# Patient Record
Sex: Female | Born: 1940 | Race: White | Hispanic: No | Marital: Married | Smoking: Former smoker
Health system: Southern US, Community
[De-identification: ages and names within clinical notes are randomized; demographics above are authoritative.]

## PROBLEM LIST (undated history)

## (undated) DIAGNOSIS — I639 Cerebral infarction, unspecified: Secondary | ICD-10-CM

## (undated) DIAGNOSIS — I10 Essential (primary) hypertension: Secondary | ICD-10-CM

## (undated) DIAGNOSIS — K8020 Calculus of gallbladder without cholecystitis without obstruction: Secondary | ICD-10-CM

## (undated) DIAGNOSIS — R42 Dizziness and giddiness: Secondary | ICD-10-CM

## (undated) DIAGNOSIS — N200 Calculus of kidney: Secondary | ICD-10-CM

## (undated) DIAGNOSIS — E119 Type 2 diabetes mellitus without complications: Secondary | ICD-10-CM

## (undated) HISTORY — DX: Calculus of gallbladder without cholecystitis without obstruction: K80.20

## (undated) HISTORY — PX: KIDNEY STONE SURGERY: SHX686

## (undated) HISTORY — DX: Cerebral infarction, unspecified: I63.9

## (undated) HISTORY — PX: ABDOMINAL HYSTERECTOMY: SHX81

---

## 1996-10-24 DIAGNOSIS — I639 Cerebral infarction, unspecified: Secondary | ICD-10-CM

## 1996-10-24 HISTORY — DX: Cerebral infarction, unspecified: I63.9

## 2014-11-07 ENCOUNTER — Encounter (HOSPITAL_BASED_OUTPATIENT_CLINIC_OR_DEPARTMENT_OTHER): Payer: Self-pay

## 2014-11-07 ENCOUNTER — Emergency Department (HOSPITAL_BASED_OUTPATIENT_CLINIC_OR_DEPARTMENT_OTHER)
Admission: EM | Admit: 2014-11-07 | Discharge: 2014-11-08 | Disposition: A | Discharge status: Home / Self Care | Payer: Medicare Other | Attending: Emergency Medicine | Admitting: Emergency Medicine

## 2014-11-07 DIAGNOSIS — M545 Low back pain, unspecified: Secondary | ICD-10-CM

## 2014-11-07 DIAGNOSIS — Z794 Long term (current) use of insulin: Secondary | ICD-10-CM | POA: Insufficient documentation

## 2014-11-07 DIAGNOSIS — Z87442 Personal history of urinary calculi: Secondary | ICD-10-CM | POA: Insufficient documentation

## 2014-11-07 DIAGNOSIS — Z7982 Long term (current) use of aspirin: Secondary | ICD-10-CM | POA: Diagnosis not present

## 2014-11-07 DIAGNOSIS — I10 Essential (primary) hypertension: Secondary | ICD-10-CM | POA: Diagnosis not present

## 2014-11-07 DIAGNOSIS — E119 Type 2 diabetes mellitus without complications: Secondary | ICD-10-CM | POA: Insufficient documentation

## 2014-11-07 DIAGNOSIS — R05 Cough: Secondary | ICD-10-CM | POA: Diagnosis not present

## 2014-11-07 DIAGNOSIS — Z79899 Other long term (current) drug therapy: Secondary | ICD-10-CM | POA: Diagnosis not present

## 2014-11-07 HISTORY — DX: Type 2 diabetes mellitus without complications: E11.9

## 2014-11-07 HISTORY — DX: Calculus of kidney: N20.0

## 2014-11-07 HISTORY — DX: Essential (primary) hypertension: I10

## 2014-11-07 MED ORDER — KETOROLAC TROMETHAMINE 60 MG/2ML IM SOLN
30.0000 mg | Freq: Once | INTRAMUSCULAR | Status: AC
  Administered 2014-11-07: 30 mg via INTRAMUSCULAR
  Filled 2014-11-07: qty 2

## 2014-11-07 MED ORDER — DIAZEPAM 5 MG PO TABS
5.0000 mg | ORAL_TABLET | Freq: Once | ORAL | Status: AC
  Administered 2014-11-07: 5 mg via ORAL
  Filled 2014-11-07: qty 1

## 2014-11-07 MED ORDER — HYDROCODONE-ACETAMINOPHEN 5-325 MG PO TABS
1.0000 | ORAL_TABLET | Freq: Once | ORAL | Status: AC
  Administered 2014-11-07: 1 via ORAL
  Filled 2014-11-07: qty 1

## 2014-11-07 NOTE — ED Notes (Addendum)
Pain to left back-states feels is a pulled muscle from coughing-pt states PCP started her on zithromax and tesslon perles Tuesday for bronchitis

## 2014-11-07 NOTE — ED Provider Notes (Signed)
CSN: [id number]   Arrival date & time 11/07/14 2209  History  By signing my name below, I, Bethel BornBritney McCollum, attest that this documentation has been prepared under the direction and in the presence of Marily MemosJason Noreen Mackintosh, MD. Electronically Signed: Bethel BornBritney McCollum, ED Scribe. 11/07/2014. 11:22 PM.  Chief Complaint  Patient presents with  . Back Pain    HPI The history is provided by the patient. No language interpreter was used.   Jill NuttingCarol Bohall is a 74 y.o. female who presents to the Emergency Department complaining of new and constant lower left back pain with onset 2 days ago after working at [employer]. The pain is rated 8/10 in severity and is worse with bending at the waist.  Pt states that she pulled a muscle from a cough that she has had for 3 weeks. She was seen by her PCP 3 days ago and started on Zithromax and tessalon pearls. She had similar pain years ago with a pulled muscle after bending to pick something up. No recent injury (notes a fall in July). Pt denies incontinence of bowel or bladder, change in strength or sensation, and change in appetite. History of uterine cancer treated with hysterectomy. History of small vessel disease.   Past Medical History  Diagnosis Date  . Diabetes mellitus without complication (HCC)   . Hypertension   . Kidney stone     Past Surgical History  Procedure Laterality Date  . Abdominal hysterectomy    . Kidney stone surgery      No family history on file.  Social History  Substance Use Topics  . Smoking status: Never Smoker   . Smokeless tobacco: None  . Alcohol Use: No     Review of Systems  Constitutional: Negative for fever, chills and appetite change.  Respiratory: Positive for cough.   Gastrointestinal: Negative for nausea and vomiting.  Musculoskeletal: Positive for back pain.  Neurological: Negative for weakness.  All other systems reviewed and are negative.  Home Medications   Prior to Admission medications   Medication Sig Start  Date End Date Taking? Authorizing Provider  amLODipine (NORVASC) 5 MG tablet Take 5 mg by mouth daily.   Yes Historical Provider, MD  aspirin 81 MG tablet Take 81 mg by mouth daily.   Yes Historical Provider, MD  atorvastatin (LIPITOR) 40 MG tablet Take 40 mg by mouth daily.   Yes Historical Provider, MD  Azithromycin (ZITHROMAX PO) Take by mouth.   Yes Historical Provider, MD  benzonatate (TESSALON) 100 MG capsule Take by mouth 3 (three) times daily as needed for cough.   Yes Historical Provider, MD  Canagliflozin (INVOKANA PO) Take by mouth.   Yes Historical Provider, MD  Cholecalciferol (VITAMIN D3) 5000 UNITS CAPS Take by mouth.   Yes Historical Provider, MD  glyBURIDE (DIABETA) 5 MG tablet Take 5 mg by mouth 2 (two) times daily with a meal.   Yes Historical Provider, MD  insulin glargine (LANTUS) 100 UNIT/ML injection Inject into the skin at bedtime.   Yes Historical Provider, MD  metFORMIN (GLUCOPHAGE) 1000 MG tablet Take 1,000 mg by mouth 2 (two) times daily with a meal.   Yes Historical Provider, MD  valsartan (DIOVAN) 320 MG tablet Take 320 mg by mouth daily.   Yes Historical Provider, MD  diazepam (VALIUM) 5 MG tablet Take 1 tablet (5 mg total) by mouth every 8 (eight) hours as needed for anxiety. 11/08/14   Marily MemosJason Roshonda Sperl, MD  HYDROcodone-acetaminophen (NORCO/VICODIN) 5-325 MG tablet Take 1 tablet by mouth  every 6 (six) hours as needed for moderate pain. 11/08/14   Marily Memos, MD  ibuprofen (ADVIL,MOTRIN) 400 MG tablet Take 1 tablet (400 mg total) by mouth 3 (three) times daily. 11/08/14   Marily Memos, MD    Allergies  Ultram  Triage Vitals: BP 147/60 mmHg  Pulse 92  Temp(Src) 98.1 F (36.7 C) (Oral)  Resp 18  Ht  (1.6 m)  Wt 183 lb (83.008 kg)  BMI 32.43 kg/m2  SpO2 99%  Physical Exam  Constitutional: She is oriented to person, place, and time. She appears well-developed and well-nourished.  HENT:  Head: Normocephalic.  Eyes: EOM are normal.  Neck: Normal range of  motion.  Pulmonary/Chest: Effort normal.  Abdominal: She exhibits no distension.  Musculoskeletal: Normal range of motion.  Left lumbar paraspinous TTP. Right side is normal. No midline tenderness.   Neurological: She is alert and oriented to person, place, and time.  Decreased strength in left hip abduction and adduction likely secondary to pain. Equal 4/5 strength in all other extremities Normal sensation. 2+ patellar reflexes.  Psychiatric: She has a normal mood and affect.  Nursing note and vitals reviewed.   ED Course  Procedures   DIAGNOSTIC STUDIES: Oxygen Saturation is 99% on RA, normal by my interpretation.    COORDINATION OF CARE: 11:17 PM Discussed treatment plan which includes valium and pain management with pt at bedside and pt agreed to plan.  Labs Reviewed - No data to display  Imaging Review No results found.    MDM   Final diagnoses:  Left-sided low back pain without sciatica    Left sided low back pain, likely 2/2 muscle strain. Abdomen benign, pain over lower pelvic area beyond aortic bifurcation, normal DP pulses and BP, doubt aortic aneurysm. No midline pain or other symptoms tosuggest bony or spinal pathology. Symptoms improved some with valium and vicodin, able to ambulate easier, move with less pain, will continue same at home with PCP follow up.   I have personally and contemperaneously reviewed labs and imaging and used in my decision making as above.   A medical screening exam was performed and I feel the patient has had an appropriate workup for their chief complaint at this time and likelihood of emergent condition existing is low. They have been counseled on decision, discharge, follow up and which symptoms necessitate immediate return to the emergency department. They or their family verbally stated understanding and agreement with plan and discharged in stable condition.    I personally performed the services described in this documentation,  which was scribed in my presence. The recorded information has been reviewed and is accurate.    Marily Memos, MD 11/08/14 [phone]

## 2014-11-08 MED ORDER — HYDROCODONE-ACETAMINOPHEN 5-325 MG PO TABS: 1.0000 | ORAL_TABLET | Freq: Four times a day (QID) | ORAL | Status: DC | PRN

## 2014-11-08 MED ORDER — DIAZEPAM 5 MG PO TABS: 5.0000 mg | ORAL_TABLET | Freq: Three times a day (TID) | ORAL | Status: DC | PRN

## 2014-11-08 MED ORDER — IBUPROFEN 400 MG PO TABS: 400.0000 mg | ORAL_TABLET | Freq: Three times a day (TID) | ORAL | Status: DC

## 2016-10-24 ENCOUNTER — Inpatient Hospital Stay (HOSPITAL_COMMUNITY): Payer: Medicare Other

## 2016-10-24 ENCOUNTER — Emergency Department (HOSPITAL_BASED_OUTPATIENT_CLINIC_OR_DEPARTMENT_OTHER): Payer: Medicare Other

## 2016-10-24 ENCOUNTER — Encounter (HOSPITAL_BASED_OUTPATIENT_CLINIC_OR_DEPARTMENT_OTHER): Payer: Self-pay | Admitting: Emergency Medicine

## 2016-10-24 ENCOUNTER — Inpatient Hospital Stay (HOSPITAL_BASED_OUTPATIENT_CLINIC_OR_DEPARTMENT_OTHER)
Admission: EM | Admit: 2016-10-24 | Discharge: 2016-10-26 | DRG: 066 | Disposition: A | Discharge status: Home Health | Payer: Medicare Other | Attending: Family Medicine | Admitting: Family Medicine

## 2016-10-24 DIAGNOSIS — E118 Type 2 diabetes mellitus with unspecified complications: Secondary | ICD-10-CM

## 2016-10-24 DIAGNOSIS — M797 Fibromyalgia: Secondary | ICD-10-CM | POA: Diagnosis present

## 2016-10-24 DIAGNOSIS — Z8669 Personal history of other diseases of the nervous system and sense organs: Secondary | ICD-10-CM

## 2016-10-24 DIAGNOSIS — G8321 Monoplegia of upper limb affecting right dominant side: Secondary | ICD-10-CM | POA: Diagnosis present

## 2016-10-24 DIAGNOSIS — E1165 Type 2 diabetes mellitus with hyperglycemia: Secondary | ICD-10-CM | POA: Diagnosis present

## 2016-10-24 DIAGNOSIS — I63512 Cerebral infarction due to unspecified occlusion or stenosis of left middle cerebral artery: Secondary | ICD-10-CM | POA: Diagnosis present

## 2016-10-24 DIAGNOSIS — G43909 Migraine, unspecified, not intractable, without status migrainosus: Secondary | ICD-10-CM | POA: Diagnosis present

## 2016-10-24 DIAGNOSIS — R29700 NIHSS score 0: Secondary | ICD-10-CM | POA: Diagnosis present

## 2016-10-24 DIAGNOSIS — Z823 Family history of stroke: Secondary | ICD-10-CM

## 2016-10-24 DIAGNOSIS — Z809 Family history of malignant neoplasm, unspecified: Secondary | ICD-10-CM

## 2016-10-24 DIAGNOSIS — R4781 Slurred speech: Secondary | ICD-10-CM | POA: Diagnosis present

## 2016-10-24 DIAGNOSIS — F419 Anxiety disorder, unspecified: Secondary | ICD-10-CM | POA: Diagnosis present

## 2016-10-24 DIAGNOSIS — R42 Dizziness and giddiness: Secondary | ICD-10-CM | POA: Diagnosis present

## 2016-10-24 DIAGNOSIS — Z888 Allergy status to other drugs, medicaments and biological substances status: Secondary | ICD-10-CM

## 2016-10-24 DIAGNOSIS — I6300 Cerebral infarction due to thrombosis of unspecified precerebral artery: Secondary | ICD-10-CM | POA: Diagnosis not present

## 2016-10-24 DIAGNOSIS — R1319 Other dysphagia: Secondary | ICD-10-CM | POA: Diagnosis present

## 2016-10-24 DIAGNOSIS — I10 Essential (primary) hypertension: Secondary | ICD-10-CM | POA: Diagnosis present

## 2016-10-24 DIAGNOSIS — R29704 NIHSS score 4: Secondary | ICD-10-CM | POA: Diagnosis not present

## 2016-10-24 DIAGNOSIS — Z79891 Long term (current) use of opiate analgesic: Secondary | ICD-10-CM

## 2016-10-24 DIAGNOSIS — H538 Other visual disturbances: Secondary | ICD-10-CM | POA: Diagnosis present

## 2016-10-24 DIAGNOSIS — R4701 Aphasia: Secondary | ICD-10-CM | POA: Diagnosis present

## 2016-10-24 DIAGNOSIS — I639 Cerebral infarction, unspecified: Secondary | ICD-10-CM

## 2016-10-24 DIAGNOSIS — Z833 Family history of diabetes mellitus: Secondary | ICD-10-CM

## 2016-10-24 DIAGNOSIS — Z87891 Personal history of nicotine dependence: Secondary | ICD-10-CM

## 2016-10-24 DIAGNOSIS — Z91040 Latex allergy status: Secondary | ICD-10-CM

## 2016-10-24 DIAGNOSIS — Z6829 Body mass index (BMI) 29.0-29.9, adult: Secondary | ICD-10-CM | POA: Diagnosis not present

## 2016-10-24 DIAGNOSIS — Z79899 Other long term (current) drug therapy: Secondary | ICD-10-CM

## 2016-10-24 DIAGNOSIS — G894 Chronic pain syndrome: Secondary | ICD-10-CM | POA: Diagnosis not present

## 2016-10-24 DIAGNOSIS — R2681 Unsteadiness on feet: Secondary | ICD-10-CM | POA: Diagnosis present

## 2016-10-24 DIAGNOSIS — Z885 Allergy status to narcotic agent status: Secondary | ICD-10-CM

## 2016-10-24 DIAGNOSIS — H53461 Homonymous bilateral field defects, right side: Secondary | ICD-10-CM | POA: Diagnosis present

## 2016-10-24 DIAGNOSIS — Z87442 Personal history of urinary calculi: Secondary | ICD-10-CM | POA: Diagnosis not present

## 2016-10-24 DIAGNOSIS — G8929 Other chronic pain: Secondary | ICD-10-CM | POA: Diagnosis present

## 2016-10-24 DIAGNOSIS — I63312 Cerebral infarction due to thrombosis of left middle cerebral artery: Secondary | ICD-10-CM | POA: Diagnosis not present

## 2016-10-24 DIAGNOSIS — E785 Hyperlipidemia, unspecified: Secondary | ICD-10-CM | POA: Diagnosis present

## 2016-10-24 DIAGNOSIS — H9203 Otalgia, bilateral: Secondary | ICD-10-CM | POA: Diagnosis present

## 2016-10-24 DIAGNOSIS — I679 Cerebrovascular disease, unspecified: Secondary | ICD-10-CM | POA: Insufficient documentation

## 2016-10-24 DIAGNOSIS — Z887 Allergy status to serum and vaccine status: Secondary | ICD-10-CM

## 2016-10-24 DIAGNOSIS — E669 Obesity, unspecified: Secondary | ICD-10-CM | POA: Diagnosis present

## 2016-10-24 DIAGNOSIS — Z7982 Long term (current) use of aspirin: Secondary | ICD-10-CM

## 2016-10-24 DIAGNOSIS — Z794 Long term (current) use of insulin: Secondary | ICD-10-CM

## 2016-10-24 DIAGNOSIS — Z8249 Family history of ischemic heart disease and other diseases of the circulatory system: Secondary | ICD-10-CM

## 2016-10-24 DIAGNOSIS — Z91048 Other nonmedicinal substance allergy status: Secondary | ICD-10-CM

## 2016-10-24 DIAGNOSIS — Z90710 Acquired absence of both cervix and uterus: Secondary | ICD-10-CM

## 2016-10-24 HISTORY — DX: Dizziness and giddiness: R42

## 2016-10-24 LAB — COMPREHENSIVE METABOLIC PANEL
ALK PHOS: 74 U/L (ref 38–126)
ALT: 17 U/L (ref 14–54)
AST: 23 U/L (ref 15–41)
Albumin: 3.5 g/dL (ref 3.5–5.0)
Anion gap: 7 (ref 5–15)
BILIRUBIN TOTAL: 0.5 mg/dL (ref 0.3–1.2)
BUN: 18 mg/dL (ref 6–20)
CALCIUM: 9.6 mg/dL (ref 8.9–10.3)
CO2: 19 mmol/L — ABNORMAL LOW (ref 22–32)
CREATININE: 0.61 mg/dL (ref 0.44–1.00)
Chloride: 110 mmol/L (ref 101–111)
GFR calc Af Amer: >60 mL/min (ref >60)
Glucose, Bld: 142 mg/dL — ABNORMAL HIGH (ref 65–99)
Potassium: 4.4 mmol/L (ref 3.5–5.1)
Sodium: 136 mmol/L (ref 135–145)
TOTAL PROTEIN: 7.6 g/dL (ref 6.5–8.1)

## 2016-10-24 LAB — ETHANOL: Alcohol, Ethyl (B): <10 mg/dL (ref <10)

## 2016-10-24 LAB — GLUCOSE, CAPILLARY
GLUCOSE-CAPILLARY: 229 mg/dL — AB (ref 65–99)
GLUCOSE-CAPILLARY: 86 mg/dL (ref 65–99)

## 2016-10-24 LAB — DIFFERENTIAL
BASOS ABS: 0 10*3/uL (ref 0.0–0.1)
Basophils Relative: 0 %
EOS ABS: 0.2 10*3/uL (ref 0.0–0.7)
Eosinophils Relative: 3 %
LYMPHS ABS: 2.9 10*3/uL (ref 0.7–4.0)
Lymphocytes Relative: 43 %
MONOS PCT: 7 %
Monocytes Absolute: 0.4 10*3/uL (ref 0.1–1.0)
NEUTROS ABS: 3.1 10*3/uL (ref 1.7–7.7)
Neutrophils Relative %: 47 %

## 2016-10-24 LAB — URINALYSIS, ROUTINE W REFLEX MICROSCOPIC
Bilirubin Urine: NEGATIVE
HGB URINE DIPSTICK: NEGATIVE
KETONES UR: NEGATIVE mg/dL
Leukocytes, UA: NEGATIVE
NITRITE: NEGATIVE
PROTEIN: NEGATIVE mg/dL
SPECIFIC GRAVITY, URINE: 1.01 (ref 1.005–1.030)
pH: 5.5 (ref 5.0–8.0)

## 2016-10-24 LAB — CBC
HEMATOCRIT: 38.1 % (ref 36.0–46.0)
HEMOGLOBIN: 12.2 g/dL (ref 12.0–15.0)
MCH: 27.7 pg (ref 26.0–34.0)
MCHC: 32 g/dL (ref 30.0–36.0)
MCV: 86.4 fL (ref 78.0–100.0)
Platelets: 202 10*3/uL (ref 150–400)
RBC: 4.41 MIL/uL (ref 3.87–5.11)
RDW: 15.1 % (ref 11.5–15.5)
WBC: 6.7 10*3/uL (ref 4.0–10.5)

## 2016-10-24 LAB — CBG MONITORING, ED: Glucose-Capillary: 145 mg/dL — ABNORMAL HIGH (ref 65–99)

## 2016-10-24 LAB — RAPID URINE DRUG SCREEN, HOSP PERFORMED
Amphetamines: NOT DETECTED
Barbiturates: NOT DETECTED
Benzodiazepines: NOT DETECTED
Cocaine: NOT DETECTED
Opiates: NOT DETECTED
Tetrahydrocannabinol: NOT DETECTED

## 2016-10-24 LAB — PROTIME-INR
INR: 1.02
Prothrombin Time: 13.3 seconds (ref 11.4–15.2)

## 2016-10-24 LAB — URINALYSIS, MICROSCOPIC (REFLEX): RBC / HPF: NONE SEEN RBC/hpf (ref 0–5)

## 2016-10-24 LAB — TROPONIN I

## 2016-10-24 LAB — HEMOGLOBIN A1C
Hgb A1c MFr Bld: 7 % — ABNORMAL HIGH (ref 4.8–5.6)
Mean Plasma Glucose: 154.2 mg/dL

## 2016-10-24 LAB — APTT: APTT: 27 s (ref 24–36)

## 2016-10-24 MED ORDER — IOPAMIDOL (ISOVUE-370) INJECTION 76%
INTRAVENOUS | Status: AC
  Administered 2016-10-24: 100 mL via INTRAVENOUS
  Filled 2016-10-24: qty 100

## 2016-10-24 MED ORDER — ONDANSETRON HCL 4 MG/2ML IJ SOLN: 4.0000 mg | Freq: Four times a day (QID) | INTRAMUSCULAR | Status: DC | PRN

## 2016-10-24 MED ORDER — ACETAMINOPHEN 650 MG RE SUPP: 650.0000 mg | Freq: Four times a day (QID) | RECTAL | Status: DC | PRN

## 2016-10-24 MED ORDER — ASPIRIN 325 MG PO TABS
325.0000 mg | ORAL_TABLET | Freq: Every day | ORAL | Status: DC
  Administered 2016-10-24: 325 mg via ORAL
  Filled 2016-10-24 (×2): qty 1

## 2016-10-24 MED ORDER — BENZONATATE 100 MG PO CAPS: 100.0000 mg | ORAL_CAPSULE | Freq: Three times a day (TID) | ORAL | Status: DC | PRN

## 2016-10-24 MED ORDER — INSULIN ASPART 100 UNIT/ML ~~LOC~~ SOLN: 0.0000 [IU] | Freq: Every day | SUBCUTANEOUS | Status: DC

## 2016-10-24 MED ORDER — GADOBENATE DIMEGLUMINE 529 MG/ML IV SOLN
20.0000 mL | Freq: Once | INTRAVENOUS | Status: AC | PRN
  Administered 2016-10-24: 17 mL via INTRAVENOUS

## 2016-10-24 MED ORDER — ASPIRIN 300 MG RE SUPP: 300.0000 mg | Freq: Every day | RECTAL | Status: DC

## 2016-10-24 MED ORDER — HYDROCODONE-ACETAMINOPHEN 5-325 MG PO TABS
1.0000 | ORAL_TABLET | Freq: Four times a day (QID) | ORAL | Status: DC | PRN
  Administered 2016-10-25: 1 via ORAL
  Filled 2016-10-24: qty 1

## 2016-10-24 MED ORDER — HYDRALAZINE HCL 20 MG/ML IJ SOLN: 5.0000 mg | INTRAMUSCULAR | Status: DC | PRN

## 2016-10-24 MED ORDER — ONDANSETRON HCL 4 MG PO TABS: 4.0000 mg | ORAL_TABLET | Freq: Four times a day (QID) | ORAL | Status: DC | PRN

## 2016-10-24 MED ORDER — ACETAMINOPHEN 325 MG PO TABS
650.0000 mg | ORAL_TABLET | Freq: Four times a day (QID) | ORAL | Status: DC | PRN
  Administered 2016-10-24: 650 mg via ORAL
  Filled 2016-10-24: qty 2

## 2016-10-24 MED ORDER — ATORVASTATIN CALCIUM 40 MG PO TABS
40.0000 mg | ORAL_TABLET | Freq: Every day | ORAL | Status: DC
  Filled 2016-10-24: qty 1

## 2016-10-24 MED ORDER — STROKE: EARLY STAGES OF RECOVERY BOOK: Freq: Once | Status: DC

## 2016-10-24 MED ORDER — DIAZEPAM 5 MG PO TABS: 5.0000 mg | ORAL_TABLET | Freq: Three times a day (TID) | ORAL | Status: DC | PRN

## 2016-10-24 MED ORDER — INSULIN ASPART 100 UNIT/ML ~~LOC~~ SOLN
0.0000 [IU] | Freq: Three times a day (TID) | SUBCUTANEOUS | Status: DC
  Administered 2016-10-24: 3 [IU] via SUBCUTANEOUS
  Administered 2016-10-25 (×2): 2 [IU] via SUBCUTANEOUS
  Administered 2016-10-25: 1 [IU] via SUBCUTANEOUS
  Administered 2016-10-26: 3 [IU] via SUBCUTANEOUS
  Administered 2016-10-26: 2 [IU] via SUBCUTANEOUS

## 2016-10-24 MED ORDER — DULAGLUTIDE 1.5 MG/0.5ML ~~LOC~~ SOAJ: 1.5000 mg | SUBCUTANEOUS | Status: DC

## 2016-10-24 NOTE — ED Notes (Signed)
ED Provider at bedside. 

## 2016-10-24 NOTE — ED Triage Notes (Signed)
Dr. Rubin Payor updated on pt condition

## 2016-10-24 NOTE — Progress Notes (Addendum)
Patient arrived via [facility] to [place] at [phone]. Patient reports that she was taken to [facility] by her husband because she "collapsed". Husband states  "this started about 2 months ago". "this morning, she came down to the den and was staggering like a drunk person,Speech issues for a week ago" when asked what he meant by "speech issues", Husband states "she just cant get her words out right"   He states "she's had issues holding things in her R hand from a week ago".   PT states "I have fibromyalsia". Patient reports that she has had "blurry vision" on her right eye for about 2 weeks. Patient reports having "ear aches". Asked how long its been going on and which ear is worse-Patient states "its been going on for about 2 weeks and the Right ear is worse"  Patient is A&O X4, denies pain. NIH 0 at this time. Spouse is at her bedside-Will continue to monitor.  Patient is oriented to unit, Pharmacy called to notify of patient's arrival, [facility] admission notified of patient arrival.  Patient also reports hx of Diabetes and Small vessel Disease. She states "I take  aspirin everyday"

## 2016-10-24 NOTE — Evaluation (Signed)
Physical Therapy Evaluation Patient Details Name: Jill Howard MRN: [id number] DOB: 04-26-1940 Today's Date: 10/24/2016   History of Present Illness  Pt is a 76 y/o female who presents with R sided weakness, decreased fine motor/grip strength/dysarthria of R hand. 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 MRI pending.  PMH significant for vertigo, fibromyalgia, DMII.  Clinical Impression  Pt admitted with above diagnosis. Pt currently with functional limitations due to the deficits listed below (see PT Problem List). At the time of PT eval, pt was able to ambulate around the room with gross min guard assist to supervision for safety. Pt reporting she is "fine", and her main complaint is R hand weakness. However, as session progressed, noted some word finding difficulty and using incorrect words during conversation. Evaluation was limited as transport arrived to take pt to MRI. Pt will benefit from skilled PT to increase their independence and safety with mobility to allow discharge to the venue listed below.       Follow Up Recommendations Home health PT;Supervision for mobility/OOB    Equipment Recommendations  None recommended by PT    Recommendations for Other Services       Precautions / Restrictions Precautions Precautions: Fall Restrictions Weight Bearing Restrictions: No      Mobility  Bed Mobility Overal bed mobility: Modified Independent             General bed mobility comments: Pt sitting EOB with bed alarming when PT entered. Pt was able to return to supine without assist and without difficulty.   Transfers Overall transfer level: Needs assistance Equipment used: None Transfers: Sit to/from Stand Sit to Stand: Min guard;Supervision         General transfer comment: Close guard for safety initially however progressed to supervision for safety.   Ambulation/Gait Ambulation/Gait assistance:  Min guard Ambulation Distance (Feet): 25 Feet Assistive device: None Gait Pattern/deviations: Step-through pattern;Decreased stride length;Trunk flexed Gait velocity: Decreased Gait velocity interpretation: Below normal speed for age/gender General Gait Details: Pt was able to ambulate in room only to bathroom, sink, chair, and then back to bed when transport arrived. Not agreeable to ambulating in the hall at this time.   Stairs            Wheelchair Mobility    Modified Rankin (Stroke Patients Only) Modified Rankin (Stroke Patients Only) Pre-Morbid Rankin Score: No symptoms Modified Rankin: Moderately severe disability     Balance Overall balance assessment: Needs assistance Sitting-balance support: No upper extremity supported;Feet supported Sitting balance-Leahy Scale: Fair     Standing balance support: No upper extremity supported;During functional activity Standing balance-Leahy Scale: Fair                               Pertinent Vitals/Pain Pain Assessment: No/denies pain    Home Living Family/patient expects to be discharged to:: Private residence Living Arrangements: Spouse/significant other Available Help at Discharge: Family;Available 24 hours/day Type of Home: House Home Access: Stairs to enter   [employer] of Steps: 6ish steps however only has to do 2 at a time   Home Equipment: Bedside commode Additional Comments: Not able to get all home information as transport arrived for MRI.     Prior Function Level of Independence: Independent               Hand Dominance        Extremity/Trunk Assessment  Upper Extremity Assessment Upper Extremity Assessment: RUE deficits/detail RUE Deficits / Details: Noted decreased grip strength on the R. Pt reports no N/T, just weakness in the hand.     Lower Extremity Assessment Lower Extremity Assessment: RLE deficits/detail RLE Deficits / Details: Pt reports her R knee is  "bad" at baseline and usually gets very sore with pain from fibromyalgia. 3/5 strength in quads (likely 2 knee pain), and grossly 4/5 strength in hamstrings and hip flexors. Pt denies N/T or light touch differences bilaterally    Cervical / Trunk Assessment Cervical / Trunk Assessment: Normal  Communication   Communication: Expressive difficulties (new per pt)  Cognition Arousal/Alertness: Awake/alert Behavior During Therapy: Flat affect Overall Cognitive Status: No family/caregiver present to determine baseline cognitive functioning                                 General Comments: Appears to have some decreased safety awareness with mobility.       General Comments      Exercises     Assessment/Plan    PT Assessment Patient needs continued PT services  PT Problem List Decreased strength;Decreased range of motion;Decreased activity tolerance;Decreased balance;Decreased mobility;Decreased knowledge of use of DME;Decreased safety awareness;Decreased cognition;Decreased knowledge of precautions       PT Treatment Interventions DME instruction;Gait training;Stair training;Functional mobility training;Therapeutic activities;Therapeutic exercise;Neuromuscular re-education;Patient/family education;Cognitive remediation    PT Goals (Current goals can be found in the Care Plan section)  Acute Rehab PT Goals Patient Stated Goal: None stated PT Goal Formulation: With patient Time For Goal Achievement: 10/31/16 Potential to Achieve Goals: Good    Frequency Min 4X/week   Barriers to discharge        Co-evaluation               AM-PAC PT "6 Clicks" Daily Activity  Outcome Measure Difficulty turning over in bed (including adjusting bedclothes, sheets and blankets)?: None Difficulty moving from lying on back to sitting on the side of the bed? : None Difficulty sitting down on and standing up from a chair with arms (e.g., wheelchair, bedside commode, etc,.)?: A  Little Help needed moving to and from a bed to chair (including a wheelchair)?: A Little Help needed walking in hospital room?: A Little Help needed climbing 3-5 steps with a railing? : A Little 6 Click Score: 20    End of Session Equipment Utilized During Treatment: Gait belt Activity Tolerance: Patient tolerated treatment well Patient left: in bed;Other (comment) (With transport present to take pt to MRI) Nurse Communication: Mobility status PT Visit Diagnosis: Unsteadiness on feet (R26.81);Other symptoms and signs involving the nervous system (R29.898)    Time: 9604-5409 PT Time Calculation (min) (ACUTE ONLY): 11 min   Charges:   PT Evaluation $PT Eval Moderate Complexity: 1 Mod     PT G Codes:        Conni Slipper, PT, DPT Acute Rehabilitation Services Pager: [phone]   Marylynn Pearson 10/24/2016, 2:02 PM

## 2016-10-24 NOTE — ED Notes (Signed)
Attempted to call report to floor-states they will call back to [facility].

## 2016-10-24 NOTE — ED Provider Notes (Signed)
[facility] Provider Note   CSN: [id number] Arrival date & time: 10/24/16  0801     History   Chief Complaint Chief Complaint  Patient presents with  . Difficulty Walking  . Aphasia    HPI Jill Howard is a 76 y.o. female.  HPI Patient presents with unsteadiness and difficulty finding words. States she feels weak all over but also feels somewhat like when she's had vertigo in the past. Began around a week ago. He seen her primary care doctor but unsure exactly what they thought. Patient's husband states that she said vertigo but patient says that's what she said not what he said. Has some pain in her right ear and said her eye feels sunken. Has had some dull right-sided headache also. States she's been trying to find the right words for a week but has trouble remembering the words. States she feels weak all over like there is pressure all over her. No new medicines in the last couple weeks. Does have a history of fibromyalgia. Reportedly has a spot on her brain that does not need to be followed for another couple years.Patient has had some difficulty using her right hand at times. Reportedly was eating the other day and had to switch to using her left hand. With further questioning patient's husband states around a month ago symptoms have gotten worse. Past Medical History:  Diagnosis Date  . Diabetes mellitus without complication (HCC)   . Hypertension   . Kidney stone   . Vertigo     There are no active problems to display for this patient.   Past Surgical History:  Procedure Laterality Date  . ABDOMINAL HYSTERECTOMY    . KIDNEY STONE SURGERY      OB History    No data available       Home Medications    Prior to Admission medications   Medication Sig Start Date End Date Taking? Authorizing Provider  Dulaglutide (TRULICITY Gardner) Inject into the skin.   Yes [provider]  lisinopril (PRINIVIL,ZESTRIL) 10 MG tablet Take 10 mg by mouth daily.    Yes [provider]  amLODipine (NORVASC) 5 MG tablet Take 5 mg by mouth daily.    [provider]  aspirin 81 MG tablet Take 81 mg by mouth daily.    [provider]  atorvastatin (LIPITOR) 40 MG tablet Take 40 mg by mouth daily.    [provider]  Azithromycin (ZITHROMAX PO) Take by mouth.    [provider]  benzonatate (TESSALON) 100 MG capsule Take by mouth 3 (three) times daily as needed for cough.    [provider]  Canagliflozin (INVOKANA PO) Take by mouth.    [provider]  Cholecalciferol (VITAMIN D3) 5000 UNITS CAPS Take by mouth.    [provider]  diazepam (VALIUM) 5 MG tablet Take 1 tablet (5 mg total) by mouth every 8 (eight) hours as needed for anxiety. 11/08/14   Mesner, Barbara Cower, MD  glyBURIDE (DIABETA) 5 MG tablet Take 5 mg by mouth 2 (two) times daily with a meal.    [provider]  HYDROcodone-acetaminophen (NORCO/VICODIN) 5-325 MG tablet Take 1 tablet by mouth every 6 (Howard) hours as needed for moderate pain. 11/08/14   Mesner, Barbara Cower, MD  ibuprofen (ADVIL,MOTRIN) 400 MG tablet Take 1 tablet (400 mg total) by mouth 3 (three) times daily. 11/08/14   Mesner, Barbara Cower, MD  insulin glargine (LANTUS) 100 UNIT/ML injection Inject into the skin at bedtime.  [provider]  metFORMIN (GLUCOPHAGE) 1000 MG tablet Take 1,000 mg by mouth 2 (two) times daily with a meal.    [provider]  valsartan (DIOVAN) 320 MG tablet Take 320 mg by mouth daily.    [provider]    Family History History reviewed. No pertinent family history.  Social History Social History  Substance Use Topics  . Smoking status: Never Smoker  . Smokeless tobacco: Never Used  . Alcohol use No     Allergies   Ultram [tramadol hcl]   Review of Systems Review of Systems  Constitutional: Negative for appetite change.  HENT: Positive for ear pain. Negative for sinus pressure and tinnitus.     Respiratory: Negative for shortness of breath.   Cardiovascular: Negative for chest pain.  Gastrointestinal: Negative for abdominal pain.  Genitourinary: Negative for dysuria.  Musculoskeletal: Negative for back pain.  Neurological: Positive for dizziness, speech difficulty and headaches. Negative for facial asymmetry and numbness.  Psychiatric/Behavioral: Negative for behavioral problems.     Physical Exam Updated Vital Signs BP 131/69 (BP Location: Right Arm)   Pulse 86   Temp 98.4 F (36.9 C)   Resp 18   Ht  (1.6 m)   Wt 76.7 kg (169 lb)   SpO2 100%   BMI 29.94 kg/m   Physical Exam  Constitutional: She is oriented to person, place, and time. She appears well-developed.  HENT:  Head: Atraumatic.  Eyes: Pupils are equal, round, and reactive to light. EOM are normal.  Neck: Neck supple.  Cardiovascular: Normal rate.   Pulmonary/Chest: Effort normal.  Abdominal: Soft.  Musculoskeletal: Normal range of motion.  Neurological: She is oriented to person, place, and time.  Patient is awake and appropriate. Eye movements grossly intact. Slight nystagmus with gaze to left. Does have conjugate gaze. Visual fields grossly intact by confrontation. Face symmetric. Good grips bilaterally. No drift on either upper or lower extremity. Heel shin and fingerintact bilaterally. A little unsteady during Romberg test but did not fall.  Skin: Skin is warm. Capillary refill takes less than 2 seconds.  Psychiatric: She has a normal mood and affect.     ED Treatments / Results  Labs (all labs ordered are listed, but only abnormal results are displayed) Labs Reviewed  COMPREHENSIVE METABOLIC PANEL - Abnormal; Notable for the following:       Result Value   CO2 19 (*)    Glucose, Bld 142 (*)    All other components within normal limits  URINALYSIS, ROUTINE W REFLEX MICROSCOPIC - Abnormal; Notable for the following:    Glucose, UA >=500 (*)    All other components within normal limits   URINALYSIS, MICROSCOPIC (REFLEX) - Abnormal; Notable for the following:    Bacteria, UA FEW (*)    Squamous Epithelial / LPF 0-5 (*)    All other components within normal limits  CBG MONITORING, ED - Abnormal; Notable for the following:    Glucose-Capillary 145 (*)    All other components within normal limits  ETHANOL  PROTIME-INR  APTT  CBC  DIFFERENTIAL  TROPONIN I  RAPID URINE DRUG SCREEN, HOSP PERFORMED    EKG  EKG Interpretation  Date/Time:  Monday October 24 2016 08:09:53 EDT Ventricular Rate:  81 PR Interval:    QRS Duration: 91 QT Interval:  382 QTC Calculation: 444 R Axis:   19 Text Interpretation:  Sinus rhythm Confirmed by Benjiman Core [phone]) on 10/24/2016 8:32:42 AM       Radiology  Dg Chest 2 View  Result Date: 10/24/2016 CLINICAL DATA:  Weakness. EXAM: CHEST  2 VIEW COMPARISON:  None. FINDINGS: Normal heart size and mediastinal contours. Subtle asymmetric density overlapping the heart in the AP view is not seen laterally consider transient artifactual. No acute infiltrate or edema. No effusion or pneumothorax. No acute osseous findings. EKG leads create artifact over the chest. IMPRESSION: No evidence of active disease. Electronically Signed   By: Marnee Spring M.D.   On: 10/24/2016 09:07   Ct Head Wo Contrast  Result Date: 10/24/2016 CLINICAL DATA:  Word-finding difficulty beginning 1 week ago. Unsteadiness. EXAM: CT HEAD WITHOUT CONTRAST TECHNIQUE: Contiguous axial images were obtained from the base of the skull through the vertex without intravenous contrast. COMPARISON:  None. FINDINGS: Brain: There is a small to moderate-sized posterior left MCA infarct in the inferior parietal region with well-defined cytotoxic edema and likely a small amount of petechial hemorrhage favoring an early subacute time course. No lobar hematoma or mass effect is present. Mild cerebral atrophy is within normal limits for age. Scattered subcortical and deep cerebral white  matter hypodensities bilaterally are nonspecific but compatible with mild chronic small vessel ischemic disease. There is no midline shift, mass, or extra-axial fluid collection. Vascular: Calcified atherosclerosis at the skullbase. No hyperdense vessel. Skull: No fracture focal osseous lesion. Sinuses/Orbits: Visualized paranasal sinuses and mastoid air cells are clear. Visualized orbits are unremarkable. Other: None. IMPRESSION: 1. 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 Likely small amount of petechial hemorrhage. 2. Mild chronic small vessel ischemic disease. Electronically Signed   By: Sebastian Ache M.D.   On: 10/24/2016 09:01    Procedures Procedures (including critical care time)  Medications Ordered in ED Medications - No data to display   Initial Impression / Assessment and Plan / ED Course  I have reviewed the triage vital signs and the nursing notes.  Pertinent labs & imaging results that were available during my care of the patient were reviewed by me and considered in my medical decision making (see chart for details).     Patient with likely stroke. Symptoms began somewhere between a month and a week ago and not a TPA candidate due to time of onset. Patient has likely subacute stroke on CT. However she has had a history of some sort of "white" abnormality in her brain. Will admit to hospitalist.  Final Clinical I yes mpressions(s) / ED Diagnoses   Final diagnoses:  Cerebrovascular accident (CVA), unspecified mechanism [facility])    New Prescriptions New Prescriptions   No medications on file     Benjiman Core, MD 10/24/16 [phone]

## 2016-10-24 NOTE — ED Notes (Signed)
Patient transported to CT 

## 2016-10-24 NOTE — Care Management Note (Signed)
Case Management Note  Patient Details  Name: Jill Howard MRN: [id number] Date of Birth: Apr 04, 1940  Subjective/Objective:   Pt in to r/o CVA. She is from home with her spouse.                  Action/Plan: Awaiting PT/OT recommendations. CM following for d/c needs, physician orders.   Expected Discharge Date:                  Expected Discharge Plan:     In-House Referral:     Discharge planning Services     Post Acute Care Choice:    Choice offered to:     DME Arranged:    DME Agency:     HH Arranged:    HH Agency:     Status of Service:  In process, will continue to follow  If discussed at Long Length of Stay Meetings, dates discussed:    Additional Comments:  Kermit Balo, RN 10/24/2016, 1:52 PM

## 2016-10-24 NOTE — ED Triage Notes (Signed)
Per spouse patient was staggering across the living room this morning and having a difficult time finding her words.  States the difficulty with speaking began 1 week ago.  Patient alert and oriented and speaking without difficulty at present.

## 2016-10-24 NOTE — H&P (Signed)
History and Physical    Jill Howard ZOX:[id number] DOB: 07/18/1940 DOA: 10/24/2016  PCP: System, Pcp Not In Patient coming from: [facility]  Chief Complaint: fall and difficulty speaking  HPI: Jill Howard is a 76 y.o. female with medical history significant of diabetes, hypertension, nephrolithiasis, BPV.  Patient reports an intermittent history over the last 3 weeks or so of difficulty with speaking. Or precisely described as difficulty with word finding and vocalization of the words. Patient also reports some right-handed clumsiness over the last 1-2 weeks making it difficult for her to eat. She also reports a 1-2 week history of new headaches. Patient has a long-standing history of migraines but current headache profile has been predominantly in the front part of her head which is new. On day of admission patient reports awaking at 03:00 with a severe frontal headache and a sensation of bilateral ear ache. She is able to lie in bed and fall back to sleep without any other intervention. In the morning patient's husband noticed that she was stumbling as she walked down the hall and fell onto the couch. She did not strike her head. At that time her eyes seem to be rolling around in her head and she was unable to say anything coherent. EMS was called and patient was brought to the emergency room.  Patient denies any recent chest pain, palpitations, vertigo, lightheadedness, neck stiffness, fevers, abdominal pain, dysuria, frequency, nausea, vomiting.   ED Course: Patient evaluated at [facility] and it was determined that she needed a full neurological workup for stroke. No medications or other interventions provided.   Review of Systems: As per HPI otherwise all other systems reviewed and are negative  Ambulatory Status: No restrictions prior to episode leading to admission.  Past Medical History:  Diagnosis Date  . Diabetes mellitus without complication (HCC)   .  Hypertension   . Kidney stone   . Vertigo     Past Surgical History:  Procedure Laterality Date  . ABDOMINAL HYSTERECTOMY    . KIDNEY STONE SURGERY      Social History   Social History  . Marital status: Married    Spouse name: N/A  . Number of children: N/A  . Years of education: N/A   Occupational History  . Not on file.   Social History Main Topics  . Smoking status: Never Smoker  . Smokeless tobacco: Never Used  . Alcohol use No  . Drug use: No  . Sexual activity: Not on file   Other Topics Concern  . Not on file   Social History Narrative  . No narrative on file    Allergies  Allergen Reactions  . Ultram [Tramadol Hcl]     hallucinations    Family History  Problem Relation Age of Onset  . Stroke Other   . Heart attack Other   . Cancer Other   . Diabetes Other       Prior to Admission medications   Medication Sig Start Date End Date Taking? Authorizing Provider  Dulaglutide (TRULICITY [place]) Inject into the skin.   Yes [provider]  lisinopril (PRINIVIL,ZESTRIL) 10 MG tablet Take 10 mg by mouth daily.   Yes [provider]  amLODipine (NORVASC) 5 MG tablet Take 5 mg by mouth daily.    [provider]  aspirin 81 MG tablet Take 81 mg by mouth daily.    [provider]  atorvastatin (LIPITOR) 40 MG tablet Take 40 mg by mouth  daily.    [provider]  Azithromycin (ZITHROMAX PO) Take by mouth.    [provider]  benzonatate (TESSALON) 100 MG capsule Take by mouth 3 (three) times daily as needed for cough.    [provider]  Canagliflozin (INVOKANA PO) Take by mouth.    [provider]  Cholecalciferol (VITAMIN D3) 5000 UNITS CAPS Take by mouth.    [provider]  diazepam (VALIUM) 5 MG tablet Take 1 tablet (5 mg total) by mouth every 8 (eight) hours as needed for anxiety. 11/08/14   Mesner, Barbara Cower, MD  glyBURIDE (DIABETA) 5 MG tablet Take 5 mg by mouth 2 (two) times daily  with a meal.    [provider]  HYDROcodone-acetaminophen (NORCO/VICODIN) 5-325 MG tablet Take 1 tablet by mouth every 6 (six) hours as needed for moderate pain. 11/08/14   Mesner, Barbara Cower, MD  ibuprofen (ADVIL,MOTRIN) 400 MG tablet Take 1 tablet (400 mg total) by mouth 3 (three) times daily. 11/08/14   Mesner, Barbara Cower, MD  insulin glargine (LANTUS) 100 UNIT/ML injection Inject into the skin at bedtime.    [provider]  metFORMIN (GLUCOPHAGE) 1000 MG tablet Take 1,000 mg by mouth 2 (two) times daily with a meal.    [provider]  valsartan (DIOVAN) 320 MG tablet Take 320 mg by mouth daily.    [provider]    Physical Exam: Vitals:   10/24/16 0839 10/24/16 0930 10/24/16 1018 10/24/16 1130  BP:  (!) 105/49 101/65 139/62  Pulse:  73 80 81  Resp:  Temp: 98.4 F (36.9 C)   98.1 F (36.7 C)  TempSrc:    Oral  SpO2:  100% 97% 100%  Weight:      Height:         General:  Appears calm and comfortable Eyes:  PERRL, EOMI, normal lids, iris ENT:  grossly normal hearing, lips & tongue, mmm Neck:  no LAD, masses or thyromegaly Cardiovascular:  RRR, no m/r/g. No LE edema.  Respiratory:  CTA bilaterally, no w/r/r. Normal respiratory effort. Abdomen:  soft, ntnd, NABS Skin:  no rash or induration seen on limited exam Musculoskeletal:  good ROM, no bony abnormality Psychiatric:  grossly normal mood and affect, speech fluent and appropriate, AOx3 Neurologic:  CN 2-12 grossly intact, right grip strength 4/5, left grip strength 5/5. Sensation intact. No dysmetria. Sits unassisted without   Labs on Admission: I have personally reviewed following labs and imaging studies  CBC:  Recent Labs Lab 10/24/16 0831  WBC 6.7  NEUTROABS 3.1  HGB 12.2  HCT 38.1  MCV 86.4  PLT 202   Basic Metabolic Panel:  Recent Labs Lab 10/24/16 0831  NA 136  K 4.4  CL 110  CO2 19*  GLUCOSE 142*  BUN 18  CREATININE 0.61  CALCIUM 9.6   GFR: Estimated  Creatinine Clearance: 61.4 mL/min (by C-G formula based on SCr of 0.61 mg/dL). Liver Function Tests:  Recent Labs Lab 10/24/16 0831  AST 23  ALT 17  ALKPHOS 74  BILITOT 0.5  PROT 7.6  ALBUMIN 3.5   No results for input(s): LIPASE, AMYLASE in the last 168 hours. No results for input(s): AMMONIA in the last 168 hours. Coagulation Profile:  Recent Labs Lab 10/24/16 0831  INR 1.02   Cardiac Enzymes:  Recent Labs Lab 10/24/16 0831  TROPONINI <0.03   BNP (last 3 results) No results for input(s): PROBNP in the last 8760 hours. HbA1C: No results for  input(s): HGBA1C in the last 72 hours. CBG:  Recent Labs Lab 10/24/16 0816  GLUCAP 145*   Lipid Profile: No results for input(s): CHOL, HDL, LDLCALC, TRIG, CHOLHDL, LDLDIRECT in the last 72 hours. Thyroid Function Tests: No results for input(s): TSH, T4TOTAL, FREET4, T3FREE, THYROIDAB in the last 72 hours. Anemia Panel: No results for input(s): VITAMINB12, FOLATE, FERRITIN, TIBC, IRON, RETICCTPCT in the last 72 hours. Urine analysis:    Component Value Date/Time   COLORURINE YELLOW 10/24/2016 0825   APPEARANCEUR CLEAR 10/24/2016 0825   LABSPEC 1.010 10/24/2016 0825   PHURINE 5.5 10/24/2016 0825   GLUCOSEU >=500 (A) 10/24/2016 0825   HGBUR NEGATIVE 10/24/2016 0825   BILIRUBINUR NEGATIVE 10/24/2016 0825   KETONESUR NEGATIVE 10/24/2016 0825   PROTEINUR NEGATIVE 10/24/2016 0825   NITRITE NEGATIVE 10/24/2016 0825   LEUKOCYTESUR NEGATIVE 10/24/2016 0825    Creatinine Clearance: Estimated Creatinine Clearance: 61.4 mL/min (by C-G formula based on SCr of 0.61 mg/dL).  Sepsis Labs: (procalcitonin:4,lacticidven:4) )No results found for this or any previous visit (from the past 240 hour(s)).   Radiological Exams on Admission: Dg Chest 2 View  Result Date: 10/24/2016 CLINICAL DATA:  Weakness. EXAM: CHEST  2 VIEW COMPARISON:  None. FINDINGS: Normal heart size and mediastinal contours. Subtle asymmetric density  overlapping the heart in the AP view is not seen laterally consider transient artifactual. No acute infiltrate or edema. No effusion or pneumothorax. No acute osseous findings. EKG leads create artifact over the chest. IMPRESSION: No evidence of active disease. Electronically Signed   By: Marnee Spring M.D.   On: 10/24/2016 09:07   Ct Head Wo Contrast  Result Date: 10/24/2016 CLINICAL DATA:  Word-finding difficulty beginning 1 week ago. Unsteadiness. EXAM: CT HEAD WITHOUT CONTRAST TECHNIQUE: Contiguous axial images were obtained from the base of the skull through the vertex without intravenous contrast. COMPARISON:  None. FINDINGS: Brain: There is a small to moderate-sized posterior left MCA infarct in the inferior parietal region with well-defined cytotoxic edema and likely a small amount of petechial hemorrhage favoring an early subacute time course. No lobar hematoma or mass effect is present. Mild cerebral atrophy is within normal limits for age. Scattered subcortical and deep cerebral white matter hypodensities bilaterally are nonspecific but compatible with mild chronic small vessel ischemic disease. There is no midline shift, mass, or extra-axial fluid collection. Vascular: Calcified atherosclerosis at the skullbase. No hyperdense vessel. Skull: No fracture focal osseous lesion. Sinuses/Orbits: Visualized paranasal sinuses and mastoid air cells are clear. Visualized orbits are unremarkable. Other: None. IMPRESSION: 1. 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 Likely small amount of petechial hemorrhage. 2. Mild chronic small vessel ischemic disease. Electronically Signed   By: Sebastian Ache M.D.   On: 10/24/2016 09:01    EKG: Independently reviewed. Sinus. No ACS  Assessment/Plan Active Problems:   Stroke (cerebrum) (HCC)   Essential hypertension   Diabetes mellitus with complication (HCC)   Chronic pain   Stroke: CT concerning for early subacute posterior left MCA infarct. Possible  small amount of petechial hemorrhage. Symptoms ongoing for approximately 3 weeks with acute worsening likely starting with severe headache on day of admission at 03:00. Currently patient continues to have weakened right-sided grip strength and fine motor skills in her hand and occasional dysarthria. - Stroke orders utilized - MRI/MRA head and neck - Echo - Neuro checks - Neuro consult - Permissive HTN - ASA, Statin  HTN: allow permissive HTN - Hold Valsartan, lisinopril, Norvasc  DM: - continue Trulicity, lantus (need Pharmacy  to reconcile) - SSI  Anxiety: - continue Valium  Chronic pain: - continue Norco  DVT prophylaxis: SCD  Code Status: full  Family Communication: husband  Disposition Plan: pending workup and Neuro eval  Consults called: Neuro  Admission status: inpt    Ozella Rocks MD [facility]  If 7PM-7AM, please contact night-coverage [email or web address] Password [email or web address]  10/24/2016, 12:54 PM

## 2016-10-24 NOTE — Consult Note (Addendum)
Neurology Consultation  Reason for Consult: left mCA stroke Referring Physician: Dr. Konrad Dolores  CC: word finding difficulty, right-sided weakness  History is obtained from:patient, husband, family bedside.  HPI: Jill Howard is a 76 y.o. female was past medical history of diabetes, hypertension, hyperlipidemia, fibromyalgia, ?vascular dementia (secondary to poorly controlled DM) who was in her usual state of health about 2 weeks ago, which is when she started noticing some difficulty using her right hand. She found it hard to use utensils. She had been having a hard time walking at times with leg weakness. These symptoms were off and on and completely resolved in between, hence she did not seek medical attention. She also has had frontal headache ongoing for the past 2 weeks, which is different than her usual migraines. This morning around 3 AM, she had extreme difficulty walking, could not support her weight and had word finding difficulty, slurred speech and is what prompted the family to bring her to the emergency room at [place] for evaluation. She was evaluated clinically and  Transferred to [facility] for an MRI/MRA brain. MRI of the brain showed strokes in the posterior division of the left MCA territory. The MRA of the brain also showed stenosis versus subocclusive thrombus in the left M1.   LKW: at least days ago if not 2 weeks ago. tpa given?: no, outside the window Premorbid modified Rankin scale (mRS): * 0   ROS:  Review of Systems  Constitutional: Positive for malaise/fatigue. Negative for chills and fever.  HENT: Negative for hearing loss and tinnitus.   Eyes: Positive for blurred vision. Negative for double vision.  Respiratory: Negative for cough and hemoptysis.   Cardiovascular: Negative for chest pain and palpitations.  Gastrointestinal: Negative for heartburn and nausea.  Genitourinary: Negative for dysuria and urgency.  Musculoskeletal: Positive for back pain and  myalgias.  Skin: Negative for itching and rash.  Neurological: Positive for tingling, sensory change, speech change, focal weakness and headaches. Negative for dizziness, tremors, seizures and loss of consciousness.  Endo/Heme/Allergies: Negative for environmental allergies. Does not bruise/bleed easily.  Psychiatric/Behavioral: Negative for depression and suicidal ideas.    Past Medical History:  Diagnosis Date  . Diabetes mellitus without complication (HCC)   . Hypertension   . Kidney stone   . Vertigo     Family History  Problem Relation Age of Onset  . Stroke Other   . Heart attack Other   . Cancer Other   . Diabetes Other    Social History:   reports that she has never smoked. She has never used smokeless tobacco. She reports that she does not drink alcohol or use drugs.   Medications  Current Facility-Administered Medications:  .   stroke: mapping our early stages of recovery book, , Does not apply, Once, Ozella Rocks, MD .  acetaminophen (TYLENOL) tablet 650 mg, 650 mg, Oral, Q6H PRN **OR** acetaminophen (TYLENOL) suppository 650 mg, 650 mg, Rectal, Q6H PRN, Ozella Rocks, MD .  aspirin suppository 300 mg, 300 mg, Rectal, Daily **OR** aspirin tablet 325 mg, 325 mg, Oral, Daily, Ozella Rocks, MD, 325 mg at 10/24/16 1602 .  atorvastatin (LIPITOR) tablet 40 mg, 40 mg, Oral, Daily, Ozella Rocks, MD .  benzonatate (TESSALON) capsule 100 mg, 100 mg, Oral, TID PRN, Ozella Rocks, MD .  diazepam (VALIUM) tablet 5 mg, 5 mg, Oral, Q8H PRN, Ozella Rocks, MD .  Melene Muller ON 10/25/2016] Dulaglutide SOPN 1.5 mg, 1.5 mg, Subcutaneous, Weekly, Konrad Dolores,  Elmon Else, MD .  hydrALAZINE (APRESOLINE) injection 5-10 mg, 5-10 mg, Intravenous, Q4H PRN, Ozella Rocks, MD .  HYDROcodone-acetaminophen (NORCO/VICODIN) 5-325 MG per tablet 1 tablet, 1 tablet, Oral, Q6H PRN, Ozella Rocks, MD .  insulin aspart (novoLOG) injection 0-5 Units, 0-5 Units, Subcutaneous, QHS, Ozella Rocks, MD .  insulin aspart (novoLOG) injection 0-9 Units, 0-9 Units, Subcutaneous, TID WC, Ozella Rocks, MD .  ondansetron [facility]) tablet 4 mg, 4 mg, Oral, Q6H PRN **OR** ondansetron (ZOFRAN) injection 4 mg, 4 mg, Intravenous, Q6H PRN, Ozella Rocks, MD   Exam: Current vital signs: BP 139/62 (BP Location: Left Arm)   Pulse 81   Temp 98.1 F (36.7 C) (Oral)   Resp 18   Ht  (1.6 m)   Wt 76.7 kg (169 lb)   SpO2 100%   BMI 29.94 kg/m  Vital signs in last 24 hours: Temp:  [98.1 F (36.7 C)-98.4 F (36.9 C)] 98.1 F (36.7 C) (10/01 1130) Pulse Rate:  [73-86] 81 (10/01 1130) Resp:  [15-20] 18 (10/01 1130) BP: (101-139)/(49-69) 139/62 (10/01 1130) SpO2:  [97 %-100 %] 100 % (10/01 1130) Weight:  [76.7 kg (169 lb)] 76.7 kg (169 lb) (10/01 0806)  GENERAL: Awake, alert in NAD HEENT: - Normocephalic and atraumatic, dry mm, no LN++, no Thyromegally LUNGS - Clear to auscultation bilaterally with no wheezes CV - S1S2 RRR, no m/r/g, equal pulses bilaterally. ABDOMEN - Soft, nontender, nondistended with normoactive BS Ext: warm, well perfused, intact peripheral pulses,  No edema  NEURO:  Mental Status: AA&Ox3  Language: speech is dear.  Naming, repetition, fluency, and comprehension intact.although she makes some paraphasic errors Cranial Nerves: PERRL 48mm/brisk. EOMI, visual fields examination shows right homonymous hemianopsia, and also extinguishes the right finger on confrontation, no facial asymmetry,facial sensation intact, hearing intact, tongue/uvula/soft palate midline, normal sternocleidomastoid and trapezius muscle strength. No evidence of tongue atrophy or fibrillations Motor: 5/5 in the right shoulder andelbow flexion and extension. 4/5 right hand grip. 5/5 left upper extremity all muscle groups. 4/5 right lower extremity limited by pain. 4+/5 left lower extremity-limited by pain. Tone: is normal and bulk is normal Sensation- Intact to light touch  bilaterally Coordination: FTN intact bilaterally, no ataxia in BLE. Gait- deferred  NIHSS 1a Level of Conscious.: 0 1b LOC Questions: 0 1c LOC Commands: 0 2 Best Gaze: 0 3 Visual: 2 4 Facial Palsy: 0 5a Motor Arm - left: 0 5b Motor Arm - Right: 0 6a Motor Leg - Left: 0 6b Motor Leg - Right: 0 7 Limb Ataxia: 0 8 Sensory: 0 9 Best Language: 1 10 Dysarthria: 0 11 Extinct. and Inatten.: 1 TOTAL: 4     Labs I have reviewed labs in epic and the results pertinent to this consultation are:  CBC    Component Value Date/Time   WBC 6.7 10/24/2016 0831   RBC 4.41 10/24/2016 0831   HGB 12.2 10/24/2016 0831   HCT 38.1 10/24/2016 0831   PLT 202 10/24/2016 0831   MCV 86.4 10/24/2016 0831   MCH 27.7 10/24/2016 0831   MCHC 32.0 10/24/2016 0831   RDW 15.1 10/24/2016 0831   LYMPHSABS 2.9 10/24/2016 0831   MONOABS 0.4 10/24/2016 0831   EOSABS 0.2 10/24/2016 0831   BASOSABS 0.0 10/24/2016 0831    CMP     Component Value Date/Time   NA 136 10/24/2016 0831   K 4.4 10/24/2016 0831   CL 110 10/24/2016 0831   CO2 19 (L) 10/24/2016 0831  GLUCOSE 142 (H) 10/24/2016 0831   BUN 18 10/24/2016 0831   CREATININE 0.61 10/24/2016 0831   CALCIUM 9.6 10/24/2016 0831   PROT 7.6 10/24/2016 0831   ALBUMIN 3.5 10/24/2016 0831   AST 23 10/24/2016 0831   ALT 17 10/24/2016 0831   ALKPHOS 74 10/24/2016 0831   BILITOT 0.5 10/24/2016 0831   GFRNONAA >60 10/24/2016 0831   GFRAA >60 10/24/2016 0831    Lipid Panel  No results found for: CHOL, TRIG, HDL, CHOLHDL, VLDL, LDLCALC, LDLDIRECT   Imaging I have reviewed the images obtained: CT-scan of the brain - shows evidence of subacute left MCA infarct. No Frank bleed but may be subtle petechial hemorrhage. MRI examination of the brain - scattered areas of restricted diffusion in the area corresponding to the posterior division of the left MCA territory. MRA of the head and neck shows stenosis versus some occlusive thrombus in the left M1. Also,  MRA of the neck without contrast shows 50% stenosis of the left carotid just after the bifurcation.  Assessment:  76 year old woman with intermittent right-sided weakness, word finding difficulty brought in because of worsening of right-sided weakness and word finding difficulty. On examination she also has some neglect ( visual) and right homonymous hemianopsia. Her brain imaging reveals scattered embolic looking infarcts in the left MCA more so in the posterior division territory. Magnetic resonance angiogram of the head vessels shows M1 stenosis versus subocclusive thrombus.  Impression: Acute ischemic stroke- Etiology: intracranial atherosclerosis versus cardioembolic versus atherombolic   Recommendations: I have ordered a CT angiogram of the head with perfusion study to better characterize the flow dynamics around the left M1 stenosis/subocclusive thrombus. Stroke risk factor workup to include: -Telemetry -2-D echo -A1c, lipid panel I would recommend aspirin 325, statin for now for stroke prevention. If the CT angiogram and perfusion of the brain is suggestive of intracranial atherosclerosis and not a thrombus, consider dual antiplatelet treatment for 3 months Stroke team will follow up with recommendations based on clinical course and test results.  Please call with questions.  Milon Dikes, MD [facility] [phone]  If 7pm to 7am, please call on call as listed on AMION.

## 2016-10-25 ENCOUNTER — Inpatient Hospital Stay (HOSPITAL_COMMUNITY): Payer: Medicare Other

## 2016-10-25 ENCOUNTER — Encounter (HOSPITAL_COMMUNITY): Payer: Self-pay | Admitting: Internal Medicine

## 2016-10-25 DIAGNOSIS — I639 Cerebral infarction, unspecified: Secondary | ICD-10-CM | POA: Diagnosis present

## 2016-10-25 DIAGNOSIS — I10 Essential (primary) hypertension: Secondary | ICD-10-CM

## 2016-10-25 DIAGNOSIS — Z8673 Personal history of transient ischemic attack (TIA), and cerebral infarction without residual deficits: Secondary | ICD-10-CM

## 2016-10-25 DIAGNOSIS — I63312 Cerebral infarction due to thrombosis of left middle cerebral artery: Secondary | ICD-10-CM

## 2016-10-25 HISTORY — DX: Personal history of transient ischemic attack (TIA), and cerebral infarction without residual deficits: Z86.73

## 2016-10-25 LAB — COMPREHENSIVE METABOLIC PANEL
ALBUMIN: 3 g/dL — AB (ref 3.5–5.0)
ALK PHOS: 70 U/L (ref 38–126)
ALT: 16 U/L (ref 14–54)
ANION GAP: 10 (ref 5–15)
AST: 18 U/L (ref 15–41)
BILIRUBIN TOTAL: 0.4 mg/dL (ref 0.3–1.2)
BUN: 13 mg/dL (ref 6–20)
CO2: 20 mmol/L — ABNORMAL LOW (ref 22–32)
Calcium: 9.2 mg/dL (ref 8.9–10.3)
Chloride: 108 mmol/L (ref 101–111)
Creatinine, Ser: 0.63 mg/dL (ref 0.44–1.00)
GFR calc Af Amer: >60 mL/min (ref >60)
GFR calc non Af Amer: >60 mL/min (ref >60)
GLUCOSE: 131 mg/dL — AB (ref 65–99)
Potassium: 3.8 mmol/L (ref 3.5–5.1)
Sodium: 138 mmol/L (ref 135–145)
TOTAL PROTEIN: 6.7 g/dL (ref 6.5–8.1)

## 2016-10-25 LAB — ECHOCARDIOGRAM COMPLETE
AOASC: 36 cm
CHL CUP MV DEC (S): 331
CHL CUP RV SYS PRESS: 22 mmHg
EWDT: 331 ms
FS: 40 % (ref 28–44)
Height: 63 in
IV/PV OW: 1.2
LA diam index: 2.24 cm/m2
LA vol A4C: 44.1 ml
LA vol: 46.9 mL
LASIZE: 42 mm
LAVOLIN: 25.1 mL/m2
LEFT ATRIUM END SYS DIAM: 42 mm
LV PW d: 10 mm — AB (ref 0.6–1.1)
LV TDI E'LATERAL: 10.4
LV TDI E'MEDIAL: 7.4
LVELAT: 10.4 cm/s
LVOT area: 2.54 cm2
LVOT diameter: 18 mm
Lateral S' vel: 13.1 cm/s
MV pk E vel: 0.7 m/s
Reg peak vel: 218 cm/s
TAPSE: 19.4 mm
TR max vel: 218 cm/s
Weight: 2704 oz

## 2016-10-25 LAB — LIPID PANEL
CHOLESTEROL: 146 mg/dL (ref 0–200)
HDL: 33 mg/dL — ABNORMAL LOW (ref >40)
LDL CALC: 81 mg/dL (ref 0–99)
TRIGLYCERIDES: 161 mg/dL — AB (ref <150)
Total CHOL/HDL Ratio: 4.4 RATIO
VLDL: 32 mg/dL (ref 0–40)

## 2016-10-25 LAB — GLUCOSE, CAPILLARY
Glucose-Capillary: 123 mg/dL — ABNORMAL HIGH (ref 65–99)
Glucose-Capillary: 169 mg/dL — ABNORMAL HIGH (ref 65–99)
Glucose-Capillary: 184 mg/dL — ABNORMAL HIGH (ref 65–99)
Glucose-Capillary: 173 mg/dL — ABNORMAL HIGH (ref 65–99)

## 2016-10-25 LAB — CBC
HEMATOCRIT: 37.4 % (ref 36.0–46.0)
HEMOGLOBIN: 11.9 g/dL — AB (ref 12.0–15.0)
MCH: 27.6 pg (ref 26.0–34.0)
MCHC: 31.8 g/dL (ref 30.0–36.0)
MCV: 86.8 fL (ref 78.0–100.0)
Platelets: 194 10*3/uL (ref 150–400)
RBC: 4.31 MIL/uL (ref 3.87–5.11)
RDW: 15.6 % — ABNORMAL HIGH (ref 11.5–15.5)
WBC: 6.3 10*3/uL (ref 4.0–10.5)

## 2016-10-25 MED ORDER — ATORVASTATIN CALCIUM 80 MG PO TABS: 80.0000 mg | ORAL_TABLET | Freq: Every day | ORAL | Status: DC

## 2016-10-25 MED ORDER — ASPIRIN 81 MG PO CHEW
81.0000 mg | CHEWABLE_TABLET | Freq: Every day | ORAL | Status: DC
  Administered 2016-10-25 – 2016-10-26 (×2): 81 mg via ORAL
  Filled 2016-10-25 (×2): qty 1

## 2016-10-25 MED ORDER — ENOXAPARIN SODIUM 40 MG/0.4ML ~~LOC~~ SOLN
40.0000 mg | SUBCUTANEOUS | Status: DC
  Administered 2016-10-26: 40 mg via SUBCUTANEOUS
  Filled 2016-10-25: qty 0.4

## 2016-10-25 MED ORDER — ATORVASTATIN CALCIUM 80 MG PO TABS: 80.0000 mg | ORAL_TABLET | Freq: Every day | ORAL | 3 refills | Status: DC

## 2016-10-25 MED ORDER — CLOPIDOGREL BISULFATE 75 MG PO TABS
300.0000 mg | ORAL_TABLET | Freq: Once | ORAL | Status: AC
  Administered 2016-10-25: 300 mg via ORAL
  Filled 2016-10-25: qty 4

## 2016-10-25 MED ORDER — CLOPIDOGREL BISULFATE 75 MG PO TABS
75.0000 mg | ORAL_TABLET | Freq: Every day | ORAL | Status: DC
  Administered 2016-10-26: 75 mg via ORAL
  Filled 2016-10-25: qty 1

## 2016-10-25 MED ORDER — CLOPIDOGREL BISULFATE 75 MG PO TABS: 75.0000 mg | ORAL_TABLET | Freq: Every day | ORAL | 4 refills | Status: DC

## 2016-10-25 NOTE — Progress Notes (Signed)
  Echocardiogram 2D Echocardiogram has been performed.  Jill Howard 10/25/2016, 12:09 PM

## 2016-10-25 NOTE — Plan of Care (Signed)
Discussed results with patient and daughter. Patient and family requested discharge in AM as she is having headache, nausea and not feeling well.    Thad Ranger M.D. [facility] 10/25/2016, 4:08 PM  Pager: [phone]

## 2016-10-25 NOTE — Progress Notes (Signed)
STROKE TEAM PROGRESS NOTE   HISTORY OF PRESENT ILLNESS (per record) Bernece Gall is a 76 y.o. female was past medical history of diabetes, hypertension, hyperlipidemia, fibromyalgia, ?vascular dementia (secondary to poorly controlled DM) who was in her usual state of health about 2 weeks ago, which is when she started noticing some difficulty using her right hand. She found it hard to use utensils. She had been having a hard time walking at times with leg weakness. These symptoms were off and on and completely resolved in between, hence she did not seek medical attention. She also has had frontal headache ongoing for the past 2 weeks, which is different than her usual migraines. This morning around 3 AM, she had extreme difficulty walking, could not support her weight and had word finding difficulty, slurred speech and is what prompted the family to bring her to the emergency room at [place] for evaluation. She was evaluated clinically and  Transferred to [facility] for an MRI/MRA brain. MRI of the brain showed strokes in the posterior division of the left MCA territory. The MRA of the brain also showed stenosis versus subocclusive thrombus in the left M1.   LKW: at least days ago if not 2 weeks ago. tpa given?: no, outside the window Premorbid modified Rankin scale (mRS): * 0   SUBJECTIVE (INTERVAL HISTORY) Her husband is at the bedside. On review of history she reports intermittent symptoms over at least 2 weeks of waxing and waning of these same symptoms preceding this stroke. She is resting comfortably but continues to experience word searching frustration. Her fine motor skills are impaired in right hand as well  but has been able to work with therapy. Echocardiogram was completed today.   OBJECTIVE Temp:  [98.1 F (36.7 C)-99 F (37.2 C)] 98.1 F (36.7 C) (10/02 1351) Pulse Rate:  [68-86] 78 (10/02 1351) Cardiac Rhythm: Normal sinus rhythm (10/02 0700) Resp:  [16-18] 18 (10/02  1351) BP: (111-144)/(49-70) 135/69 (10/02 1351) SpO2:  [96 %-100 %] 98 % (10/02 1351)  CBC:  Recent Labs Lab 10/24/16 0831 10/25/16 0447  WBC 6.7 6.3  NEUTROABS 3.1  --   HGB 12.2 11.9*  HCT 38.1 37.4  MCV 86.4 86.8  PLT 202 194    Basic Metabolic Panel:  Recent Labs Lab 10/24/16 0831 10/25/16 0447  NA 136 138  K 4.4 3.8  CL 110 108  CO2 19* 20*  GLUCOSE 142* 131*  BUN 18 13  CREATININE 0.61 0.63  CALCIUM 9.6 9.2    Lipid Panel:    Component Value Date/Time   CHOL 146 10/25/2016 0447   TRIG 161 (H) 10/25/2016 0447   HDL 33 (L) 10/25/2016 0447   CHOLHDL 4.4 10/25/2016 0447   VLDL 32 10/25/2016 0447   LDLCALC 81 10/25/2016 0447   HgbA1c:  Lab Results  Component Value Date   HGBA1C 7.0 (H) 10/24/2016   Urine Drug Screen:    Component Value Date/Time   LABOPIA NONE DETECTED 10/24/2016 0825   COCAINSCRNUR NONE DETECTED 10/24/2016 0825   LABBENZ NONE DETECTED 10/24/2016 0825   AMPHETMU NONE DETECTED 10/24/2016 0825   THCU NONE DETECTED 10/24/2016 0825   LABBARB NONE DETECTED 10/24/2016 0825    Alcohol Level     Component Value Date/Time   ETH <10 10/24/2016 0831    IMAGING  Ct Angio Head W Or Wo Contrast 10/24/2016 IMPRESSION: CTA NECK: 1. Atherosclerosis without hemodynamically significant stenosis or acute vascular process. CTA HEAD: 1. Short segment LEFT M1 severe  stenosis ; no emergent large vessel occlusion. 2. Severe stenosis LEFT ICA supraclinoid segment and RIGHT proximal M2 segment. Moderate stenoses bilateral anterior cerebral artery's. 3. Enhancing subacute LEFT parietal/MCA territory infarct, less likely interval hemorrhage. CT PERFUSION: 1. LEFT temporoparietal penumbra, profusion shows no discrete infarct though by prior imaging, this is not accurate. Aortic Atherosclerosis (ICD10-I70.0).  Dg Chest 2 View 10/24/2016 IMPRESSION: No evidence of active disease.  Ct Head Wo Contrast 10/24/2016 IMPRESSION: 1. Likely early subacute posterior  left MCA infarct. Likely small amount of petechial hemorrhage. 2. Mild chronic small vessel ischemic disease.  Ct Angio Neck W Or Wo Contrast 10/24/2016 IMPRESSION: CTA NECK: 1. Atherosclerosis without hemodynamically significant stenosis or acute vascular process. CTA HEAD: 1. Short segment LEFT M1 severe stenosis ; no emergent large vessel occlusion. 2. Severe stenosis LEFT ICA supraclinoid segment and RIGHT proximal M2 segment. Moderate stenoses bilateral anterior cerebral artery's. 3. Enhancing subacute LEFT parietal/MCA territory infarct, less likely interval hemorrhage. CT PERFUSION: 1. LEFT temporoparietal penumbra, profusion shows no discrete infarct though by prior imaging, this is not accurate. Aortic Atherosclerosis (ICD10-I70.0).  Mr [name] Neck W Wo Contrast 10/24/2016 IMPRESSION: 1. Scattered areas of acute ischemia within the left MCA distribution, greatest in the posterior left parietal and temporal lobes. No associated mass effect. 2. Area of hyperintense T1 weighted signal along the surface of the left posterior parietotemporal infarct likely indicates petechial hemorrhage. No space-occupying parenchymal hematoma. 3. Diminished flow related enhancement within the proximal left MCA M1 segment with normal flow related enhancement distally likely indicates subtotal occlusion. 4. Approximately 50% stenosis of the proximal left internal carotid artery.  Mr Brain Wo Contrast 10/24/2016 IMPRESSION: 1. Scattered areas of acute ischemia within the left MCA distribution, greatest in the posterior left parietal and temporal lobes. No associated mass effect. 2. Area of hyperintense T1 weighted signal along the surface of the left posterior parietotemporal infarct likely indicates petechial hemorrhage. No space-occupying parenchymal hematoma. 3. Diminished flow related enhancement within the proximal left MCA M1 segment with normal flow related enhancement distally likely indicates subtotal occlusion. 4.  Approximately 50% stenosis of the proximal left internal carotid artery.  Ct Cerebral Perfusion W Contrast 10/24/2016 IMPRESSION: CTA NECK: 1. Atherosclerosis without hemodynamically significant stenosis or acute vascular process. CTA HEAD: 1. Short segment LEFT M1 severe stenosis ; no emergent large vessel occlusion. 2. Severe stenosis LEFT ICA supraclinoid segment and RIGHT proximal M2 segment. Moderate stenoses bilateral anterior cerebral artery's. 3. Enhancing subacute LEFT parietal/MCA territory infarct, less likely interval hemorrhage. CT PERFUSION: 1. LEFT temporoparietal penumbra, profusion shows no discrete infarct though by prior imaging, this is not accurate. Aortic Atherosclerosis (ICD10-I70.0).  Mr [name] Head Wo Contrast 10/24/2016 IMPRESSION: 1. Scattered areas of acute ischemia within the left MCA distribution, greatest in the posterior left parietal and temporal lobes. No associated mass effect. 2. Area of hyperintense T1 weighted signal along the surface of the left posterior parietotemporal infarct likely indicates petechial hemorrhage. No space-occupying parenchymal hematoma. 3. Diminished flow related enhancement within the proximal left MCA M1 segment with normal flow related enhancement distally likely indicates subtotal occlusion. 4. Approximately 50% stenosis of the proximal left internal carotid artery.    PHYSICAL EXAM Constitutional- alert, co-operative, NAD Cardiac- RRR, no murmurs, rubs or gallops. Respiratory- CTAB, no wheezes or crackles. Neurologic- CN: Eye movement intact throughout, partial right temporal visual field deficit, tongue midline, face symmetric Language: Expressive aphasia with word searching difficulty, comprehension intact Motor: 4/5 right hand strength, 5/5 lower extremities, right hand movements slightly  slowed, gait not tested, no dysarthria Sensory: Sensation grossly intact throughout   ASSESSMENT/PLAN Ms. Pocahontas Cohenour is a 76 y.o. female  with history of diabetes mellitus, hypertension, former smoking presenting with right hand weakness and aphasia. She did not receive IV t-PA due to outside window.    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 Yes; Fluid consistency: Thin  No antithrombotic prior to admission, now on aspirin 81 mg daily and clopidogrel 75 mg daily  Patient counseled to be compliant with her antithrombotic medications  Ongoing aggressive stroke risk factor management  Therapy recommendations:  HHPT/Outpatient OT/Outpatient SLP/24hr supervision  Disposition:  Home  Hypertension  Stable  Permissive hypertension (OK if < 220/120) but gradually normalize in 5-7 days  Long-term BP goal normotensive  Hyperlipidemia  Home meds: atorvastatin , increased to  here  LDL 81, goal < 70  Continue statin at discharge  Diabetes  HgbA1c 7.0, goal < 7.0  Borderline Controlled  Other Stroke Risk Factors  Advanced age  Obesity, Body mass index is 29.94 kg/m., recommend weight loss, diet and exercise as appropriate   Migraines  Other Active Problems  She will be treated with DAPT x3 months then plavix alone given history likely multiple TIAs preceding intrinsic stroke 2/2 vessel disease. Loading dose plavix  today then .  Hospital day # 1 I have personally examined this patient, reviewed notes, independently viewed imaging studies, participated in medical decision making and plan of care.ROS completed by me  personally and pertinent positives fully documented  I have made any additions or clarifications directly to the above note. . She presented with stuttering expressive aphasia and left MCA branch infarct secondary to likely intrinsic left MCA stenosis with near occlusion. Recommend dual antiplatelet therapy of aspirin and Plavix for 3 months followed by Plavix alone. Aggressive risk factor modification. Long discussion of the bedside with patient and family and answered questions. Discussed with Dr.  Isidoro Donning. Patient may consider possible participation in the stroke atrial fibrillation trial if interested. Greater than 50% of time during this 35 minute visit was spent on counseling and coordination of care about her intracranial stenosis, TIAs, stroke discussion about treatment and prevention and answering questions. Delia Heady, MD Medical Director [facility] Pager: [phone] 10/25/2016 4:13 PM    To contact Stroke Continuity provider, please refer to [email or web address]. After hours, contact General Neurology

## 2016-10-25 NOTE — Progress Notes (Signed)
[facility]                                                                              Patient Demographics  Jill Howard, is a 76 y.o. female, DOB - 1940-10-23, ZOX:[id number]  Admit date - 10/24/2016   Admitting Physician Ozella Rocks, MD  Outpatient Primary MD for the patient is System, Pcp Not In  Outpatient specialists:   LOS - 1  days   Medical records reviewed and are as summarized below:    Chief Complaint  Patient presents with  . Difficulty Walking  . Aphasia       Brief summary   Patient is a 76 year old female with diabetes, hypertension, BPV, nephrolithiasis, migraines presented with difficulty speaking in the last 3 weeks, described as difficulty with foot finding and vocalization. She also reported right hand clumsiness over the last 1-2 weeks and new headaches. On the day of admission, patient woke up at 3 AM with severe frontal headache, bilateral earache, subsequently walked down the hall and fell onto the couch. Patient was brought to ED for further workup. MRI positive for stroke .    Assessment & Plan    Principal Problem:   CVA (cerebral vascular accident) (HCC) - MRI of the brain showed scattered areas of acute ischemia within the left MCA distribution greatest in the posterior left parietal and temporal lobes, no associated mass effect. Area of hyperintense T1 weighted signal along the surface of the left posterior parietotemporal infarct likely indicates petechial hemorrhage. - MRA of the brain and neck showed diminished flow, subtotal occlusion of the left MCA M1 segment. Approximately 50% stenosis of the proximal left ICA - CTA head and neck showed left M1 severe stenosis, no emergent large vessel occlusion, severe stenosis left ICA supraclinoid segment and right proximal M2 segment.  - 2-D echo pending - PT OT ST pending - Per neurological mentation, placed on aspirin 81 mg daily, Plavix 75 mg daily - Continue Lipitor 80 mg  daily, LDL 81  Active Problems:   Essential hypertension  - Currently stable, permissive hypertension and first 24 hours    Diabetes mellitus with complication (HCC) - Hemoglobin A1c 7.0, needs aggressive control for stroke prevention -Continue sliding scale insulin     Chronic pain - Currently stable   Code Status: full  DVT Prophylaxis:  Lovenox Family Communication: Discussed in detail with the patient, all imaging results, lab results explained to the patient     Disposition Plan: Pending neurology clearance   Time Spent in minutes  25 minutes  Procedures:  MRI, MRA brain   CT angiogram of the head and neck  Consultants:   Neurology   Antimicrobials:      Medications  Scheduled Meds: .  stroke: mapping our early stages of recovery book   Does not apply Once  . aspirin  81 mg Oral Daily  . atorvastatin  80 mg Oral Daily  . clopidogrel  300 mg Oral Once  . [START ON 10/26/2016] clopidogrel  75 mg Oral Daily  . Dulaglutide  1.5 mg Subcutaneous Weekly  . enoxaparin (LOVENOX) injection  40 mg Subcutaneous Q24H  .  insulin aspart  0-5 Units Subcutaneous QHS  . insulin aspart  0-9 Units Subcutaneous TID WC   Continuous Infusions: PRN Meds:.acetaminophen **OR** acetaminophen, benzonatate, diazepam, hydrALAZINE, HYDROcodone-acetaminophen, ondansetron **OR** ondansetron (ZOFRAN) IV   Antibiotics   Anti-infectives    None        Subjective:   Jill Howard was seen and examined today. Still having word finding difficulty, no headache nausea or vomiting. Does not feel any focal weakness at this time.  Patient denies dizziness, chest pain, shortness of breath, abdominal pain, N/V/D/C. No acute events overnight.    Objective:   Vitals:   10/24/16 2200 10/25/16 0239 10/25/16 0600 10/25/16 1013  BP: (!) 144/70 (!) 111/59 (!) 125/56 136/63  Pulse: 82 75 68 86  Resp: 16 16 18 18   Temp: 98.3 F (36.8 C) 98.7 F (37.1 C) 98.6 F (37 C) 98.9 F (37.2 C)    TempSrc: Oral Oral Oral Oral  SpO2: 96% 97% 100% 98%  Weight:      Height:        Intake/Output Summary (Last 24 hours) at 10/25/16 1153 Last data filed at 10/25/16 0900  Gross per 24 hour  Intake              240 ml  Output                0 ml  Net              240 ml     Wt Readings from Last 3 Encounters:  10/24/16 76.7 kg (169 lb)  11/07/14 83 kg (183 lb)     Exam  General: Alert and oriented x 3, NAD  Eyes:   HEENT:  Atraumatic, normocephalic  Cardiovascular: S1 S2 auscultated, no rubs, murmurs or gallops. Regular rate and rhythm.  Respiratory: Clear to auscultation bilaterally, no wheezing, rales or rhonchi  Gastrointestinal: Soft, nontender, nondistended, + bowel sounds  Ext: no pedal edema bilaterally  Neuro: AAOx3, Cr N's II- XII. Strength 5/5 upper and lower extremities bilaterally, Word finding difficulty   Musculoskeletal: No digital cyanosis, clubbing  Skin: No rashes  Psych: Normal affect and demeanor, alert and oriented x3    Data Reviewed:  I have personally reviewed following labs and imaging studies  Micro Results No results found for this or any previous visit (from the past 240 hour(s)).  Radiology Reports Ct Angio Head W Or Wo Contrast  Result Date: 10/24/2016 CLINICAL DATA:  Frontal headaches for 2 weeks. Extreme difficulty ambulating today. Follow-up LEFT MCA stroke and LEFT M1 stenosis versus thromboembolism. History of hypertension and diabetes. EXAM: CT ANGIOGRAPHY HEAD AND NECK CT PERFUSION BRAIN TECHNIQUE: Multidetector CT imaging of the head and neck was performed using the standard protocol during bolus administration of intravenous contrast. Multiplanar CT image reconstructions and MIPs were obtained to evaluate the vascular anatomy. Carotid stenosis measurements (when applicable) are obtained utilizing NASCET criteria, using the distal internal carotid diameter as the denominator. Multiphase CT imaging of the brain was performed  following IV bolus contrast injection. Subsequent parametric perfusion maps were calculated using RAPID software. CONTRAST:  90 cc Isovue 370 COMPARISON:  MRI and MRA of the head October 24, 2016 at 1420 hours and CTA HEAD October 24, 2016 at 0851 hours FINDINGS: CTA NECK AORTIC ARCH: Normal appearance of the thoracic arch, normal branch pattern. Mild intimal thickening and calcific atherosclerosis. The origins of the innominate, left Common carotid artery and subclavian artery are widely patent. RIGHT CAROTID SYSTEM: Common carotid  artery is patent, motion degraded. Retropharyngeal bifurcation, mild calcific atherosclerosis without hemodynamically significant stenosis by NASCET criteria. Patent internal carotid artery, tortuous course associated with hypertension. LEFT CAROTID SYSTEM: Common carotid artery is patent, motion degraded. Retropharyngeal bifurcation, mild calcific atherosclerosis without hemodynamically significant stenosis by NASCET criteria. Patent internal carotid artery, tortuous course associated with hypertension. VERTEBRAL ARTERIES:Codominant vertebral artery's, widely patent. Mild extrinsic compression due to degenerative cervical spine. SKELETON: No acute osseous process though bone windows have not been submitted. Patient is edentulous. Moderate upper cervical spondylosis. OTHER NECK: Soft tissues of the neck are nonacute though, not tailored for evaluation. Moderate UPPER CHEST: Included lung apices are clear. No superior mediastinal lymphadenopathy. CTA HEAD ANTERIOR CIRCULATION: Patent cervical internal carotid arteries, petrous, cavernous and supra clinoid internal carotid arteries. Moderate calcific atherosclerosis carotid siphons. Severe stenosis LEFT supraclinoid segment. 2 mm segment of LEFT M1 severe stenosis within 2 mm of the origin. LEFT middle cerebral artery is patent distal to this stenosis. Patent anterior communicating artery. Anterior cerebral artery's are patent with moderate  stenoses bilateral distal A2 segments. Severe stenosis proximal RIGHT M2 segment. No contrast extravasation or aneurysm. POSTERIOR CIRCULATION: Patent vertebral arteries, vertebrobasilar junction and basilar artery, as well as main branch vessels. Mild calcific atherosclerosis vertebral artery's. Patent posterior cerebral arteries, mild tandem stenoses. Tiny LEFT posterior communicating artery present. No contrast extravasation or aneurysm. VENOUS SINUSES: Major dural venous sinuses are patent though not tailored for evaluation on this angiographic examination. ANATOMIC VARIANTS: Common origin RIGHT PCA and RIGHT SCA. DELAYED PHASE: Faintly dense LEFT parietal cortices at site of known infarct. MIP images reviewed. CT Brain Perfusion Findings: CBF (<30%) Volume:  3 mL, though in contralateral hemisphere. Perfusion (Tmax>6.0s) volume: 51mL Mismatch Volume: 48mL Infarction Location:LEFT temporoparietal. IMPRESSION: CTA NECK: 1. Atherosclerosis without hemodynamically significant stenosis or acute vascular process. CTA HEAD: 1. Short segment LEFT M1 severe stenosis ; no emergent large vessel occlusion. 2. Severe stenosis LEFT ICA supraclinoid segment and RIGHT proximal M2 segment. Moderate stenoses bilateral anterior cerebral artery's. 3. Enhancing subacute LEFT parietal/MCA territory infarct, less likely interval hemorrhage. CT PERFUSION: 1. LEFT temporoparietal penumbra, profusion shows no discrete infarct though by prior imaging, this is not accurate. Aortic Atherosclerosis (ICD10-I70.0). Electronically Signed   By: Awilda Metro M.D.   On: 10/24/2016 22:29   Dg Chest 2 View  Result Date: 10/24/2016 CLINICAL DATA:  Weakness. EXAM: CHEST  2 VIEW COMPARISON:  None. FINDINGS: Normal heart size and mediastinal contours. Subtle asymmetric density overlapping the heart in the AP view is not seen laterally consider transient artifactual. No acute infiltrate or edema. No effusion or pneumothorax. No acute osseous  findings. EKG leads create artifact over the chest. IMPRESSION: No evidence of active disease. Electronically Signed   By: Marnee Spring M.D.   On: 10/24/2016 09:07   Ct Head Wo Contrast  Result Date: 10/24/2016 CLINICAL DATA:  Word-finding difficulty beginning 1 week ago. Unsteadiness. EXAM: CT HEAD WITHOUT CONTRAST TECHNIQUE: Contiguous axial images were obtained from the base of the skull through the vertex without intravenous contrast. COMPARISON:  None. FINDINGS: Brain: There is a small to moderate-sized posterior left MCA infarct in the inferior parietal region with well-defined cytotoxic edema and likely a small amount of petechial hemorrhage favoring an early subacute time course. No lobar hematoma or mass effect is present. Mild cerebral atrophy is within normal limits for age. Scattered subcortical and deep cerebral white matter hypodensities bilaterally are nonspecific but compatible with mild chronic small vessel ischemic disease. There is no  midline shift, mass, or extra-axial fluid collection. Vascular: Calcified atherosclerosis at the skullbase. No hyperdense vessel. Skull: No fracture focal osseous lesion. Sinuses/Orbits: Visualized paranasal sinuses and mastoid air cells are clear. Visualized orbits are unremarkable. Other: None. IMPRESSION: 1. 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 Likely small amount of petechial hemorrhage. 2. Mild chronic small vessel ischemic disease. Electronically Signed   By: Sebastian Ache M.D.   On: 10/24/2016 09:01   Ct Angio Neck W Or Wo Contrast  Result Date: 10/24/2016 CLINICAL DATA:  Frontal headaches for 2 weeks. Extreme difficulty ambulating today. Follow-up LEFT MCA stroke and LEFT M1 stenosis versus thromboembolism. History of hypertension and diabetes. EXAM: CT ANGIOGRAPHY HEAD AND NECK CT PERFUSION BRAIN TECHNIQUE: Multidetector CT imaging of the head and neck was performed using the standard protocol during bolus administration of intravenous  contrast. Multiplanar CT image reconstructions and MIPs were obtained to evaluate the vascular anatomy. Carotid stenosis measurements (when applicable) are obtained utilizing NASCET criteria, using the distal internal carotid diameter as the denominator. Multiphase CT imaging of the brain was performed following IV bolus contrast injection. Subsequent parametric perfusion maps were calculated using RAPID software. CONTRAST:  90 cc Isovue 370 COMPARISON:  MRI and MRA of the head October 24, 2016 at 1420 hours and CTA HEAD October 24, 2016 at 0851 hours FINDINGS: CTA NECK AORTIC ARCH: Normal appearance of the thoracic arch, normal branch pattern. Mild intimal thickening and calcific atherosclerosis. The origins of the innominate, left Common carotid artery and subclavian artery are widely patent. RIGHT CAROTID SYSTEM: Common carotid artery is patent, motion degraded. Retropharyngeal bifurcation, mild calcific atherosclerosis without hemodynamically significant stenosis by NASCET criteria. Patent internal carotid artery, tortuous course associated with hypertension. LEFT CAROTID SYSTEM: Common carotid artery is patent, motion degraded. Retropharyngeal bifurcation, mild calcific atherosclerosis without hemodynamically significant stenosis by NASCET criteria. Patent internal carotid artery, tortuous course associated with hypertension. VERTEBRAL ARTERIES:Codominant vertebral artery's, widely patent. Mild extrinsic compression due to degenerative cervical spine. SKELETON: No acute osseous process though bone windows have not been submitted. Patient is edentulous. Moderate upper cervical spondylosis. OTHER NECK: Soft tissues of the neck are nonacute though, not tailored for evaluation. Moderate UPPER CHEST: Included lung apices are clear. No superior mediastinal lymphadenopathy. CTA HEAD ANTERIOR CIRCULATION: Patent cervical internal carotid arteries, petrous, cavernous and supra clinoid internal carotid arteries. Moderate  calcific atherosclerosis carotid siphons. Severe stenosis LEFT supraclinoid segment. 2 mm segment of LEFT M1 severe stenosis within 2 mm of the origin. LEFT middle cerebral artery is patent distal to this stenosis. Patent anterior communicating artery. Anterior cerebral artery's are patent with moderate stenoses bilateral distal A2 segments. Severe stenosis proximal RIGHT M2 segment. No contrast extravasation or aneurysm. POSTERIOR CIRCULATION: Patent vertebral arteries, vertebrobasilar junction and basilar artery, as well as main branch vessels. Mild calcific atherosclerosis vertebral artery's. Patent posterior cerebral arteries, mild tandem stenoses. Tiny LEFT posterior communicating artery present. No contrast extravasation or aneurysm. VENOUS SINUSES: Major dural venous sinuses are patent though not tailored for evaluation on this angiographic examination. ANATOMIC VARIANTS: Common origin RIGHT PCA and RIGHT SCA. DELAYED PHASE: Faintly dense LEFT parietal cortices at site of known infarct. MIP images reviewed. CT Brain Perfusion Findings: CBF (<30%) Volume:  3 mL, though in contralateral hemisphere. Perfusion (Tmax>6.0s) volume: 51mL Mismatch Volume: 48mL Infarction Location:LEFT temporoparietal. IMPRESSION: CTA NECK: 1. Atherosclerosis without hemodynamically significant stenosis or acute vascular process. CTA HEAD: 1. Short segment LEFT M1 severe stenosis ; no emergent large vessel occlusion. 2. Severe stenosis LEFT ICA  supraclinoid segment and RIGHT proximal M2 segment. Moderate stenoses bilateral anterior cerebral artery's. 3. Enhancing subacute LEFT parietal/MCA territory infarct, less likely interval hemorrhage. CT PERFUSION: 1. LEFT temporoparietal penumbra, profusion shows no discrete infarct though by prior imaging, this is not accurate. Aortic Atherosclerosis (ICD10-I70.0). Electronically Signed   By: Awilda Metro M.D.   On: 10/24/2016 22:29   Mr [name] Neck W Wo Contrast  Addendum Date:  10/24/2016   ADDENDUM REPORT: 10/24/2016 16:22 ADDENDUM: These results were called by telephone at the time of interpretation on 10/24/2016 at 4:21 pm to Dr. Shelly Flatten , who verbally acknowledged these results. Electronically Signed   By: Deatra Robinson M.D.   On: 10/24/2016 16:22   Result Date: 10/24/2016 CLINICAL DATA:  Focal neuro deficits for greater than 6 hours. EXAM: MRI HEAD WITHOUT AND WITH CONTRAST MRA HEAD WITHOUT CONTRAST MRA NECK WITHOUT AND WITH CONTRAST TECHNIQUE: Multiplanar, multiecho pulse sequences of the brain and surrounding structures were obtained without and with intravenous contrast. Angiographic images of the Circle of Willis were obtained using MRA technique without intravenous contrast. Angiographic images of the neck were obtained using MRA technique without and with intravenous contrast. Carotid stenosis measurements (when applicable) are obtained utilizing NASCET criteria, using the distal internal carotid diameter as the denominator. CONTRAST:  17mL MULTIHANCE GADOBENATE DIMEGLUMINE 529 MG/ML IV SOLN COMPARISON:  Head CT 10/24/2016 FINDINGS: MRI HEAD FINDINGS Brain: The midline structures are normal. Multiple areas of diffusion restriction scattered through the left MCA distribution, greatest in the left parietal lobe. No contralateral foci of ischemia. There is edema within the same distribution as the ischemia. Minimal periventricular hyperintense T2 weighted signal. There is hyperintense T1 weighted signal along the cortical surface of the left parietoccipital infarcted territory without associated susceptibility abnormality. No mass lesion. No hydrocephalus, age advanced atrophy or lobar predominant volume loss. Skull and upper cervical spine: The visualized skull base, calvarium, upper cervical spine and extracranial soft tissues are normal. Sinuses/Orbits: No fluid levels or advanced mucosal thickening. No mastoid effusion. Normal orbits. MRA HEAD FINDINGS Intracranial  internal carotid arteries: Normal. Anterior cerebral arteries: Normal. Middle cerebral arteries: There is a focal area of diminished flow related enhancement in the proximal left M1 segment. Enhancement is normal within the left MCA distally. Posterior communicating arteries: Absent bilaterally. Posterior cerebral arteries: Normal. Basilar artery: Normal. Vertebral arteries: Codominant. Normal. Superior cerebellar arteries: Normal. Anterior inferior cerebellar arteries: Normal. Posterior inferior cerebellar arteries: Normal. MRA NECK FINDINGS There is 50% stenosis of the proximal left internal carotid artery just distal to the carotid bifurcation. No other carotid stenosis. The codominant vertebral arteries are normal. IMPRESSION: 1. Scattered areas of acute ischemia within the left MCA distribution, greatest in the posterior left parietal and temporal lobes. No associated mass effect. 2. Area of hyperintense T1 weighted signal along the surface of the left posterior parietotemporal infarct likely indicates petechial hemorrhage. No space-occupying parenchymal hematoma. 3. Diminished flow related enhancement within the proximal left MCA M1 segment with normal flow related enhancement distally likely indicates subtotal occlusion. 4. Approximately 50% stenosis of the proximal left internal carotid artery. Electronically Signed: By: Deatra Robinson M.D. On: 10/24/2016 15:54   Mr Brain Wo Contrast  Addendum Date: 10/24/2016   ADDENDUM REPORT: 10/24/2016 16:22 ADDENDUM: These results were called by telephone at the time of interpretation on 10/24/2016 at 4:21 pm to Dr. Shelly Flatten , who verbally acknowledged these results. Electronically Signed   By: Deatra Robinson M.D.   On: 10/24/2016 16:22   Result  Date: 10/24/2016 CLINICAL DATA:  Focal neuro deficits for greater than 6 hours. EXAM: MRI HEAD WITHOUT AND WITH CONTRAST MRA HEAD WITHOUT CONTRAST MRA NECK WITHOUT AND WITH CONTRAST TECHNIQUE: Multiplanar, multiecho  pulse sequences of the brain and surrounding structures were obtained without and with intravenous contrast. Angiographic images of the Circle of Willis were obtained using MRA technique without intravenous contrast. Angiographic images of the neck were obtained using MRA technique without and with intravenous contrast. Carotid stenosis measurements (when applicable) are obtained utilizing NASCET criteria, using the distal internal carotid diameter as the denominator. CONTRAST:  17mL MULTIHANCE GADOBENATE DIMEGLUMINE 529 MG/ML IV SOLN COMPARISON:  Head CT 10/24/2016 FINDINGS: MRI HEAD FINDINGS Brain: The midline structures are normal. Multiple areas of diffusion restriction scattered through the left MCA distribution, greatest in the left parietal lobe. No contralateral foci of ischemia. There is edema within the same distribution as the ischemia. Minimal periventricular hyperintense T2 weighted signal. There is hyperintense T1 weighted signal along the cortical surface of the left parietoccipital infarcted territory without associated susceptibility abnormality. No mass lesion. No hydrocephalus, age advanced atrophy or lobar predominant volume loss. Skull and upper cervical spine: The visualized skull base, calvarium, upper cervical spine and extracranial soft tissues are normal. Sinuses/Orbits: No fluid levels or advanced mucosal thickening. No mastoid effusion. Normal orbits. MRA HEAD FINDINGS Intracranial internal carotid arteries: Normal. Anterior cerebral arteries: Normal. Middle cerebral arteries: There is a focal area of diminished flow related enhancement in the proximal left M1 segment. Enhancement is normal within the left MCA distally. Posterior communicating arteries: Absent bilaterally. Posterior cerebral arteries: Normal. Basilar artery: Normal. Vertebral arteries: Codominant. Normal. Superior cerebellar arteries: Normal. Anterior inferior cerebellar arteries: Normal. Posterior inferior cerebellar  arteries: Normal. MRA NECK FINDINGS There is 50% stenosis of the proximal left internal carotid artery just distal to the carotid bifurcation. No other carotid stenosis. The codominant vertebral arteries are normal. IMPRESSION: 1. Scattered areas of acute ischemia within the left MCA distribution, greatest in the posterior left parietal and temporal lobes. No associated mass effect. 2. Area of hyperintense T1 weighted signal along the surface of the left posterior parietotemporal infarct likely indicates petechial hemorrhage. No space-occupying parenchymal hematoma. 3. Diminished flow related enhancement within the proximal left MCA M1 segment with normal flow related enhancement distally likely indicates subtotal occlusion. 4. Approximately 50% stenosis of the proximal left internal carotid artery. Electronically Signed: By: Deatra Robinson M.D. On: 10/24/2016 15:54   Ct Cerebral Perfusion W Contrast  Result Date: 10/24/2016 CLINICAL DATA:  Frontal headaches for 2 weeks. Extreme difficulty ambulating today. Follow-up LEFT MCA stroke and LEFT M1 stenosis versus thromboembolism. History of hypertension and diabetes. EXAM: CT ANGIOGRAPHY HEAD AND NECK CT PERFUSION BRAIN TECHNIQUE: Multidetector CT imaging of the head and neck was performed using the standard protocol during bolus administration of intravenous contrast. Multiplanar CT image reconstructions and MIPs were obtained to evaluate the vascular anatomy. Carotid stenosis measurements (when applicable) are obtained utilizing NASCET criteria, using the distal internal carotid diameter as the denominator. Multiphase CT imaging of the brain was performed following IV bolus contrast injection. Subsequent parametric perfusion maps were calculated using RAPID software. CONTRAST:  90 cc Isovue 370 COMPARISON:  MRI and MRA of the head October 24, 2016 at 1420 hours and CTA HEAD October 24, 2016 at 0851 hours FINDINGS: CTA NECK AORTIC ARCH: Normal appearance of the  thoracic arch, normal branch pattern. Mild intimal thickening and calcific atherosclerosis. The origins of the innominate, left Common carotid artery and  subclavian artery are widely patent. RIGHT CAROTID SYSTEM: Common carotid artery is patent, motion degraded. Retropharyngeal bifurcation, mild calcific atherosclerosis without hemodynamically significant stenosis by NASCET criteria. Patent internal carotid artery, tortuous course associated with hypertension. LEFT CAROTID SYSTEM: Common carotid artery is patent, motion degraded. Retropharyngeal bifurcation, mild calcific atherosclerosis without hemodynamically significant stenosis by NASCET criteria. Patent internal carotid artery, tortuous course associated with hypertension. VERTEBRAL ARTERIES:Codominant vertebral artery's, widely patent. Mild extrinsic compression due to degenerative cervical spine. SKELETON: No acute osseous process though bone windows have not been submitted. Patient is edentulous. Moderate upper cervical spondylosis. OTHER NECK: Soft tissues of the neck are nonacute though, not tailored for evaluation. Moderate UPPER CHEST: Included lung apices are clear. No superior mediastinal lymphadenopathy. CTA HEAD ANTERIOR CIRCULATION: Patent cervical internal carotid arteries, petrous, cavernous and supra clinoid internal carotid arteries. Moderate calcific atherosclerosis carotid siphons. Severe stenosis LEFT supraclinoid segment. 2 mm segment of LEFT M1 severe stenosis within 2 mm of the origin. LEFT middle cerebral artery is patent distal to this stenosis. Patent anterior communicating artery. Anterior cerebral artery's are patent with moderate stenoses bilateral distal A2 segments. Severe stenosis proximal RIGHT M2 segment. No contrast extravasation or aneurysm. POSTERIOR CIRCULATION: Patent vertebral arteries, vertebrobasilar junction and basilar artery, as well as main branch vessels. Mild calcific atherosclerosis vertebral artery's. Patent  posterior cerebral arteries, mild tandem stenoses. Tiny LEFT posterior communicating artery present. No contrast extravasation or aneurysm. VENOUS SINUSES: Major dural venous sinuses are patent though not tailored for evaluation on this angiographic examination. ANATOMIC VARIANTS: Common origin RIGHT PCA and RIGHT SCA. DELAYED PHASE: Faintly dense LEFT parietal cortices at site of known infarct. MIP images reviewed. CT Brain Perfusion Findings: CBF (<30%) Volume:  3 mL, though in contralateral hemisphere. Perfusion (Tmax>6.0s) volume: 51mL Mismatch Volume: 48mL Infarction Location:LEFT temporoparietal. IMPRESSION: CTA NECK: 1. Atherosclerosis without hemodynamically significant stenosis or acute vascular process. CTA HEAD: 1. Short segment LEFT M1 severe stenosis ; no emergent large vessel occlusion. 2. Severe stenosis LEFT ICA supraclinoid segment and RIGHT proximal M2 segment. Moderate stenoses bilateral anterior cerebral artery's. 3. Enhancing subacute LEFT parietal/MCA territory infarct, less likely interval hemorrhage. CT PERFUSION: 1. LEFT temporoparietal penumbra, profusion shows no discrete infarct though by prior imaging, this is not accurate. Aortic Atherosclerosis (ICD10-I70.0). Electronically Signed   By: Awilda Metro M.D.   On: 10/24/2016 22:29   Mr [name] Head Wo Contrast  Addendum Date: 10/24/2016   ADDENDUM REPORT: 10/24/2016 16:22 ADDENDUM: These results were called by telephone at the time of interpretation on 10/24/2016 at 4:21 pm to Dr. Shelly Flatten , who verbally acknowledged these results. Electronically Signed   By: Deatra Robinson M.D.   On: 10/24/2016 16:22   Result Date: 10/24/2016 CLINICAL DATA:  Focal neuro deficits for greater than 6 hours. EXAM: MRI HEAD WITHOUT AND WITH CONTRAST MRA HEAD WITHOUT CONTRAST MRA NECK WITHOUT AND WITH CONTRAST TECHNIQUE: Multiplanar, multiecho pulse sequences of the brain and surrounding structures were obtained without and with intravenous contrast.  Angiographic images of the Circle of Willis were obtained using MRA technique without intravenous contrast. Angiographic images of the neck were obtained using MRA technique without and with intravenous contrast. Carotid stenosis measurements (when applicable) are obtained utilizing NASCET criteria, using the distal internal carotid diameter as the denominator. CONTRAST:  17mL MULTIHANCE GADOBENATE DIMEGLUMINE 529 MG/ML IV SOLN COMPARISON:  Head CT 10/24/2016 FINDINGS: MRI HEAD FINDINGS Brain: The midline structures are normal. Multiple areas of diffusion restriction scattered through the left MCA distribution, greatest in the  left parietal lobe. No contralateral foci of ischemia. There is edema within the same distribution as the ischemia. Minimal periventricular hyperintense T2 weighted signal. There is hyperintense T1 weighted signal along the cortical surface of the left parietoccipital infarcted territory without associated susceptibility abnormality. No mass lesion. No hydrocephalus, age advanced atrophy or lobar predominant volume loss. Skull and upper cervical spine: The visualized skull base, calvarium, upper cervical spine and extracranial soft tissues are normal. Sinuses/Orbits: No fluid levels or advanced mucosal thickening. No mastoid effusion. Normal orbits. MRA HEAD FINDINGS Intracranial internal carotid arteries: Normal. Anterior cerebral arteries: Normal. Middle cerebral arteries: There is a focal area of diminished flow related enhancement in the proximal left M1 segment. Enhancement is normal within the left MCA distally. Posterior communicating arteries: Absent bilaterally. Posterior cerebral arteries: Normal. Basilar artery: Normal. Vertebral arteries: Codominant. Normal. Superior cerebellar arteries: Normal. Anterior inferior cerebellar arteries: Normal. Posterior inferior cerebellar arteries: Normal. MRA NECK FINDINGS There is 50% stenosis of the proximal left internal carotid artery just  distal to the carotid bifurcation. No other carotid stenosis. The codominant vertebral arteries are normal. IMPRESSION: 1. Scattered areas of acute ischemia within the left MCA distribution, greatest in the posterior left parietal and temporal lobes. No associated mass effect. 2. Area of hyperintense T1 weighted signal along the surface of the left posterior parietotemporal infarct likely indicates petechial hemorrhage. No space-occupying parenchymal hematoma. 3. Diminished flow related enhancement within the proximal left MCA M1 segment with normal flow related enhancement distally likely indicates subtotal occlusion. 4. Approximately 50% stenosis of the proximal left internal carotid artery. Electronically Signed: By: Deatra Robinson M.D. On: 10/24/2016 15:54    Lab Data:  CBC:  Recent Labs Lab 10/24/16 0831 10/25/16 0447  WBC 6.7 6.3  NEUTROABS 3.1  --   HGB 12.2 11.9*  HCT 38.1 37.4  MCV 86.4 86.8  PLT 202 194   Basic Metabolic Panel:  Recent Labs Lab 10/24/16 0831 10/25/16 0447  NA 136 138  K 4.4 3.8  CL 110 108  CO2 19* 20*  GLUCOSE 142* 131*  BUN 18 13  CREATININE 0.61 0.63  CALCIUM 9.6 9.2   GFR: Estimated Creatinine Clearance: 61.4 mL/min (by C-G formula based on SCr of 0.63 mg/dL). Liver Function Tests:  Recent Labs Lab 10/24/16 0831 10/25/16 0447  AST 23 18  ALT 17 16  ALKPHOS 74 70  BILITOT 0.5 0.4  PROT 7.6 6.7  ALBUMIN 3.5 3.0*   No results for input(s): LIPASE, AMYLASE in the last 168 hours. No results for input(s): AMMONIA in the last 168 hours. Coagulation Profile:  Recent Labs Lab 10/24/16 0831  INR 1.02   Cardiac Enzymes:  Recent Labs Lab 10/24/16 0831  TROPONINI <0.03   BNP (last 3 results) No results for input(s): PROBNP in the last 8760 hours. HbA1C:  Recent Labs  10/24/16 1539  HGBA1C 7.0*   CBG:  Recent Labs Lab 10/24/16 0816 10/24/16 1749 10/24/16 2212 10/25/16 0701 10/25/16 1116  GLUCAP 145* 229* 86 123* 169*    Lipid Profile:  Recent Labs  10/25/16 0447  CHOL 146  HDL 33*  LDLCALC 81  TRIG 409*  CHOLHDL 4.4   Thyroid Function Tests: No results for input(s): TSH, T4TOTAL, FREET4, T3FREE, THYROIDAB in the last 72 hours. Anemia Panel: No results for input(s): VITAMINB12, FOLATE, FERRITIN, TIBC, IRON, RETICCTPCT in the last 72 hours. Urine analysis:    Component Value Date/Time   COLORURINE YELLOW 10/24/2016 0825   APPEARANCEUR CLEAR 10/24/2016 0825  LABSPEC 1.010 10/24/2016 0825   PHURINE 5.5 10/24/2016 0825   GLUCOSEU >=500 (A) 10/24/2016 0825   HGBUR NEGATIVE 10/24/2016 0825   BILIRUBINUR NEGATIVE 10/24/2016 0825   KETONESUR NEGATIVE 10/24/2016 0825   PROTEINUR NEGATIVE 10/24/2016 0825   NITRITE NEGATIVE 10/24/2016 0825   LEUKOCYTESUR NEGATIVE 10/24/2016 0825     Elbridge Magowan M.D. [facility] 10/25/2016, 11:53 AM  Pager: [phone] Between 7am to 7pm - call Pager - [phone]  After 7pm go to [email or web address] - password [email or web address]  Call night coverage person covering after 7pm

## 2016-10-25 NOTE — Evaluation (Signed)
Speech Language Pathology Evaluation Patient Details Name: Jill Howard MRN: [id number] DOB: October 26, 1940 Today's Date: 10/25/2016 Time: 8756-4332 SLP Time Calculation (min) (ACUTE ONLY): 31 min  Problem List:  Patient Active Problem List   Diagnosis Date Noted  . CVA (cerebral vascular accident) (HCC) 10/25/2016  . 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 Essential hypertension 10/24/2016  . Diabetes mellitus with complication (HCC) 10/24/2016  . Chronic pain 10/24/2016   Past Medical History:  Past Medical History:  Diagnosis Date  . Diabetes mellitus without complication (HCC)   . Hypertension   . Kidney stone   . Vertigo    Past Surgical History:  Past Surgical History:  Procedure Laterality Date  . ABDOMINAL HYSTERECTOMY    . KIDNEY STONE SURGERY     HPI:  Pt is a 76 y/o female who presents with R sided weakness, decreased fine motor/grip strength/dysarthria of R hand. MRI showed scattered areas of acute ischemia within the L MCA distribution, greatest in the posterior L parietal and temporal lobes. PMH significant for vertigo, fibromyalgia, DMII.   Assessment / Plan / Recommendation Clinical Impression  Pt has a primarily expressive aphasia with basic receptive language abilities seeimingly intact. She attempts to use multimodal communication methods to get her message across, using writing, alernative words, and gestures. Min cues were provided for awareness of linguistic errors, which are characterized by word-finding difficulties, phonemic paraphasias, and occasional perseveration. Phonemic cueing seemed to be the most effective strategy to facilitate her verbal output. Pt will benefit from SLP f/u, acutely and with OP SLP, as well as 24/7 supervision to maximize safety and functional communication.    SLP Assessment  SLP Recommendation/Assessment: Patient needs continued Speech Lanaguage Pathology Services SLP Visit Diagnosis: Aphasia (R47.01)    Follow Up  Recommendations  Outpatient SLP;24 hour supervision/assistance    Frequency and Duration min 2x/week  2 weeks      SLP Evaluation Cognition  Overall Cognitive Status: History of cognitive impairments - at baseline       Comprehension  Auditory Comprehension Overall Auditory Comprehension: Appears within functional limits for tasks assessed    Expression Expression Primary Mode of Expression: Verbal Verbal Expression Overall Verbal Expression: Impaired Initiation: No impairment Automatic Speech: Name;Social Response Level of Generative/Spontaneous Verbalization: Conversation Naming: Impairment Confrontation: Impaired Verbal Errors: Phonemic paraphasias;Perseveration;Aware of errors Pragmatics: No impairment Effective Techniques: Phonemic cues Non-Verbal Means of Communication: Writing Written Expression Dominant Hand: Right   Oral / Motor  Motor Speech Overall Motor Speech: Appears within functional limits for tasks assessed   GO                    Maxcine Ham 10/25/2016, 3:39 PM  Maxcine Ham, M.A. CCC-SLP [phone]

## 2016-10-25 NOTE — Progress Notes (Signed)
Pt resting in bed quietly. Easily aroused. C/o headache and pain management is administered and effective. Able to make needs known. MRI completed this shift per order and pt able to make needs known. Ambulatory with steady gait. Continent of b/b. Safety maintained, will continue to monitor.

## 2016-10-25 NOTE — Evaluation (Signed)
Occupational Therapy Evaluation Patient Details Name: Jill Howard MRN: [id number] DOB: 10/21/1940 Today's Date: 10/25/2016    History of Present Illness Pt is a 76 y/o female who presents with R sided weakness, decreased fine motor/grip strength/dysarthria of R hand. 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 MRI pending.  PMH significant for vertigo, fibromyalgia, DMII.   Clinical Impression   PTA, pt was living with her husband and was independent. Pt currently requiring supervision-Min Guard for ADLs and functional mobility. Pt presenting with decreased cognition, blurry vision (significant on R side), and poor strength, coordination, and functional use of RUE. Pt would benefit from acute OT to facilitate safe dc. Recommend dc home with initial 24 hour supervision and follow up with neuro outpatient OT to optimize safety and independence with ADLs and functional mobility.     Follow Up Recommendations  Outpatient OT;Supervision/Assistance - 24 hour (Neuro)    Equipment Recommendations  None recommended by OT    Recommendations for Other Services PT consult     Precautions / Restrictions Precautions Precautions: Fall Restrictions Weight Bearing Restrictions: No      Mobility Bed Mobility Overal bed mobility: Modified Independent Bed Mobility: Supine to Sit     Supine to sit: Modified independent (Device/Increase time);HOB elevated     General bed mobility comments: Increased time and HOB elevated  Transfers Overall transfer level: Needs assistance Equipment used: None Transfers: Sit to/from Stand Sit to Stand: Min guard;Supervision         General transfer comment: Close guard for safety initially however progressed to supervision for safety.     Balance Overall balance assessment: Needs assistance Sitting-balance support: No upper extremity supported;Feet supported Sitting balance-Leahy Scale:  Fair     Standing balance support: No upper extremity supported;During functional activity Standing balance-Leahy Scale: Fair                             ADL either performed or assessed with clinical judgement   ADL Overall ADL's : Needs assistance/impaired Eating/Feeding: Set up;Sitting   Grooming: Wash/dry face;Min guard;Standing Grooming Details (indicate cue type and reason): Cues to locate wash cloth and soap. Min Guard for safety at [employer] Bathing: Set up;Supervision/ safety;Sitting   Lower Body Bathing: Min guard;Sit to/from stand   Upper Body Dressing : Set up;Supervision/safety;Sitting Upper Body Dressing Details (indicate cue type and reason): donned new gown like jacket Lower Body Dressing: Min guard;Sit to/from stand Lower Body Dressing Details (indicate cue type and reason): Donned socks at EOB. Min Guard for Chief of Staff: Min guard;Ambulation (Simulated in room)       Tub/ Shower Transfer: Tub transfer;Minimal assistance;Ambulation   Functional mobility during ADLs: Min guard;Supervision/safety General ADL Comments: Min Guard-supervision for ADLs and funcitional mobility; Min A for tub transfer.     Vision Baseline Vision/History: Wears glasses Wears Glasses: Reading only Patient Visual Report: Blurring of vision Vision Assessment?: Yes;Vision impaired- to be further tested in functional context Eye Alignment: Within Functional Limits Ocular Range of Motion: Within Functional Limits Tracking/Visual Pursuits: Decreased smoothness of horizontal tracking;Decreased smoothness of vertical tracking Visual Fields: Right visual field deficit;Impaired-to be further tested in functional context Additional Comments: Pt reporting blurry vision at R visual feild. Decreased peripheral vision on R side. Decreased smooth pursuits and difficulty maintaining R lateral gaze.     Perception     Praxis      Pertinent  Vitals/Pain Pain  Assessment: No/denies pain     Hand Dominance Right   Extremity/Trunk Assessment Upper Extremity Assessment Upper Extremity Assessment: RUE deficits/detail RUE Deficits / Details: Decreased strength and coordination of RUE. Pt with poor grasp strength and finger opposition.  RUE Sensation: decreased light touch RUE Coordination: decreased fine motor   Lower Extremity Assessment RLE Deficits / Details: Pt reports her R knee is "bad" at baseline and usually gets very sore with pain from fibromyalgia. 3/5 strength in quads (likely 2 knee pain), and grossly 4/5 strength in hamstrings and hip flexors. Pt denies N/T or light touch differences bilaterally   Cervical / Trunk Assessment Cervical / Trunk Assessment: Normal   Communication Communication Communication: Expressive difficulties   Cognition Arousal/Alertness: Awake/alert Behavior During Therapy: Flat affect Overall Cognitive Status: History of cognitive impairments - at baseline Area of Impairment: Attention;Memory;Problem solving                   Current Attention Level: Selective Memory: Decreased short-term memory       Problem Solving: Slow processing;Requires verbal cues;Requires tactile cues;Difficulty sequencing General Comments: Pt with difficulty processing and requires increased time. Pt reports that she has dementia at baseline. Pt requiring Min VCs for ST memory test   General Comments  Pt daughter and husband present for session.  Educated pt on BEFAST and daughter wrote it down    Exercises     Shoulder Instructions      Home Living Family/patient expects to be discharged to:: Private residence Living Arrangements: Spouse/significant other Available Help at Discharge: Family;Available 24 hours/day Type of Home: House Home Access: Stairs to enter [employer] of Steps: 6ish steps however only has to do 2 at a time         Bathroom Shower/Tub: Tub/shower unit;Walk-in shower    Bathroom Toilet: Standard Bathroom Accessibility: Yes   Home Equipment: Bedside commode   Additional Comments: Not able to get all home information as transport arrived for MRI.       Prior Functioning/Environment Level of Independence: Independent        Comments: ADLs, IADLs, driving, and volunteers fequenctly at church        OT Problem List: Decreased strength;Decreased range of motion;Decreased activity tolerance;Impaired balance (sitting and/or standing);Decreased coordination;Decreased cognition;Decreased safety awareness;Decreased knowledge of use of DME or AE;Decreased knowledge of precautions;Pain;Impaired UE functional use      OT Treatment/Interventions: Self-care/ADL training;Therapeutic exercise;Energy conservation;DME and/or AE instruction;Therapeutic activities;Patient/family education    OT Goals(Current goals can be found in the care plan section) Acute Rehab OT Goals Patient Stated Goal: Go home OT Goal Formulation: With patient/family Time For Goal Achievement: 11/08/16 Potential to Achieve Goals: Good ADL Goals Pt Will Perform Grooming: with modified independence;standing (gathering all grooming items) Pt/caregiver will Perform Home Exercise Program: Right Upper extremity;With Supervision;With written HEP provided;With theraputty Additional ADL Goal #1: Pt will perform three step ADLs with Min VCs  OT Frequency: Min 2X/week   Barriers to D/C:            Co-evaluation              AM-PAC PT "6 Clicks" Daily Activity     Outcome Measure Help from another person eating meals?: A Little Help from another person taking care of personal grooming?: A Little Help from another person toileting, which includes using toliet, bedpan, or urinal?: A Little Help from another person bathing (including washing, rinsing, drying)?: A Little Help from another person to put on  and taking off regular upper body clothing?: A Little Help from another person to put  on and taking off regular lower body clothing?: A Little 6 Click Score: 18   End of Session Equipment Utilized During Treatment: Gait belt Nurse Communication: Mobility status  Activity Tolerance: Patient tolerated treatment well Patient left: in bed;with call bell/phone within reach;with bed alarm set;with family/visitor present (EOB)  OT Visit Diagnosis: Unsteadiness on feet (R26.81);Other abnormalities of gait and mobility (R26.89);Muscle weakness (generalized) (M62.81);Hemiplegia and hemiparesis;Other symptoms and signs involving cognitive function Hemiplegia - Right/Left: Right Hemiplegia - dominant/non-dominant: Dominant Hemiplegia - caused by: Cerebral infarction                Time: 0940-1007 OT Time Calculation (min): 27 min Charges:  OT General Charges $OT Visit: 1 Visit OT Evaluation $OT Eval Moderate Complexity: 1 Mod OT Treatments $Self Care/Home Management : 8-22 mins G-Codes:     Jill Howard MSOT, OTR/L Acute Rehab Pager: [phone] Office: [phone]  Theodoro Grist Doral Ventrella 10/25/2016, 12:46 PM

## 2016-10-25 NOTE — Progress Notes (Signed)
Physical Therapy Treatment Patient Details Name: Jill Howard MRN: [id number] DOB: 1940/01/29 Today's Date: 10/25/2016    History of Present Illness Pt is a 76 y/o female who presents with R sided weakness, decreased fine motor/grip strength/dysarthria of R hand. 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 MRI pending.  PMH significant for vertigo, fibromyalgia, DMII.    PT Comments    Focus of session was stair training. Pt was able to complete 5 stairs with gross supervision for safety and use of rail. Pt anticipates d/c home today and reports no further questions. She is agreeable to HHPT to follow for a short time with the hope of transitioning to neuro outpatient physical therapy as soon as she is able. Will continue to follow until d/c.    Follow Up Recommendations  Home health PT;Supervision for mobility/OOB     Equipment Recommendations  None recommended by PT    Recommendations for Other Services       Precautions / Restrictions Precautions Precautions: Fall Restrictions Weight Bearing Restrictions: No    Mobility  Bed Mobility Overal bed mobility: Modified Independent Bed Mobility: Supine to Sit     Supine to sit: Modified independent (Device/Increase time);HOB elevated     General bed mobility comments: Pt sitting EOB when PT arrived.  Transfers Overall transfer level: Needs assistance Equipment used: None Transfers: Sit to/from Stand Sit to Stand: Supervision         General transfer comment: Light supervision provided for all sit<>stand. No unsteadiness or LOB noted.   Ambulation/Gait Ambulation/Gait assistance: Supervision Ambulation Distance (Feet): 500 Feet Assistive device: None Gait Pattern/deviations: Step-through pattern;Decreased stride length;Trunk flexed Gait velocity: Decreased Gait velocity interpretation: Below normal speed for age/gender General Gait Details: Somewhat  slowed but generally steady. No assist required. No unsteadiness or LOB noted. Pt appears stiff with little arm swing or trunk rotation throughout gait cycle.    Stairs Stairs: Yes   Stair Management: One rail Left;Step to pattern;Forwards Number of Stairs: 5 General stair comments: VC's for sequencing and general safety. Treated the RLE as the weaker leg 2 chronic pain in knee.   Wheelchair Mobility    Modified Rankin (Stroke Patients Only) Modified Rankin (Stroke Patients Only) Pre-Morbid Rankin Score: No symptoms Modified Rankin: Moderately severe disability     Balance Overall balance assessment: Needs assistance Sitting-balance support: No upper extremity supported;Feet supported Sitting balance-Leahy Scale: Fair     Standing balance support: No upper extremity supported;During functional activity Standing balance-Leahy Scale: Fair                              Cognition Arousal/Alertness: Awake/alert Behavior During Therapy: Flat affect Overall Cognitive Status: History of cognitive impairments - at baseline Area of Impairment: Attention;Memory;Problem solving                   Current Attention Level: Selective Memory: Decreased short-term memory       Problem Solving: Slow processing;Requires verbal cues;Requires tactile cues;Difficulty sequencing General Comments: Pt with difficulty processing and requires increased time. Pt reports that she has dementia at baseline.      Exercises      General Comments General comments (skin integrity, edema, etc.): Pt daughter and husband present for session.  Educated pt on BEFAST and daughter wrote it down      Pertinent Vitals/Pain Pain Assessment: No/denies pain    Home Living Family/patient  expects to be discharged to:: Private residence Living Arrangements: Spouse/significant other Available Help at Discharge: Family;Available 24 hours/day Type of Home: House Home Access: Stairs to  enter     Home Equipment: Bedside commode Additional Comments: Not able to get all home information as transport arrived for MRI.     Prior Function Level of Independence: Independent      Comments: ADLs, IADLs, driving, and volunteers fe[employer] at [employer]   PT Goals (current goals can now be found in the care plan section) Acute Rehab PT Goals Patient Stated Goal: Go home PT Goal Formulation: With patient Time For Goal Achievement: 10/31/16 Potential to Achieve Goals: Good Progress towards PT goals: Progressing toward goals    Frequency    Min 4X/week      PT Plan      Co-evaluation              AM-PAC PT "6 Clicks" Daily Activity  Outcome Measure  Difficulty turning over in bed (including adjusting bedclothes, sheets and blankets)?: None Difficulty moving from lying on back to sitting on the side of the bed? : None Difficulty sitting down on and standing up from a chair with arms (e.g., wheelchair, bedside commode, etc,.)?: A Little Help needed moving to and from a bed to chair (including a wheelchair)?: A Little Help needed walking in hospital room?: A Little Help needed climbing 3-5 steps with a railing? : A Little 6 Click Score: 20    End of Session Equipment Utilized During Treatment: Gait belt Activity Tolerance: Patient tolerated treatment well Patient left: in bed;Other (comment) (With transport present to take pt to MRI) Nurse Communication: Mobility status PT Visit Diagnosis: Unsteadiness on feet (R26.81);Other symptoms and signs involving the nervous system (R29.898)     Time: 1422-1440 PT Time Calculation (min) (ACUTE ONLY): 18 min  Charges:  $Gait Training: 8-22 mins                    G Codes:       Jill Howard, PT, DPT Acute Rehabilitation Services Pager: [phone]    Jill Howard 10/25/2016, 3:02 PM

## 2016-10-26 DIAGNOSIS — I6300 Cerebral infarction due to thrombosis of unspecified precerebral artery: Secondary | ICD-10-CM

## 2016-10-26 DIAGNOSIS — E785 Hyperlipidemia, unspecified: Secondary | ICD-10-CM

## 2016-10-26 LAB — CBC
HCT: 36.6 % (ref 36.0–46.0)
HEMOGLOBIN: 11.3 g/dL — AB (ref 12.0–15.0)
MCH: 26.8 pg (ref 26.0–34.0)
MCHC: 30.9 g/dL (ref 30.0–36.0)
MCV: 86.9 fL (ref 78.0–100.0)
Platelets: 191 10*3/uL (ref 150–400)
RBC: 4.21 MIL/uL (ref 3.87–5.11)
RDW: 15.4 % (ref 11.5–15.5)
WBC: 5.6 10*3/uL (ref 4.0–10.5)

## 2016-10-26 LAB — BASIC METABOLIC PANEL
ANION GAP: 10 (ref 5–15)
BUN: 12 mg/dL (ref 6–20)
CALCIUM: 9.7 mg/dL (ref 8.9–10.3)
CHLORIDE: 104 mmol/L (ref 101–111)
CO2: 23 mmol/L (ref 22–32)
Creatinine, Ser: 0.6 mg/dL (ref 0.44–1.00)
GFR calc Af Amer: >60 mL/min (ref >60)
GFR calc non Af Amer: >60 mL/min (ref >60)
GLUCOSE: 159 mg/dL — AB (ref 65–99)
Potassium: 4.4 mmol/L (ref 3.5–5.1)
Sodium: 137 mmol/L (ref 135–145)

## 2016-10-26 LAB — GLUCOSE, CAPILLARY
Glucose-Capillary: 183 mg/dL — ABNORMAL HIGH (ref 65–99)
Glucose-Capillary: 207 mg/dL — ABNORMAL HIGH (ref 65–99)

## 2016-10-26 NOTE — Discharge Summary (Signed)
Physician Discharge Summary  Satoya Feeley JXB:[id number] DOB: [birth year] DOA: 10/24/2016  PCP: System, Pcp Not In  Mardene Speak in [facility], not in system  Admit date: 10/24/2016 Discharge date: 10/26/2016  Recommendations for Outpatient Follow-up:  1. Follow up stroke 2. Home health physical, occupational, speech therapy 3. Ongoing care for diabetes and hypertension   Follow-up Information    Micki Riley, MD. Schedule an appointment as soon as possible for a visit in 6 week(s).   Specialties:  Neurology, Radiology Why:  Make an appointment for neurology clinic follow up in 6 weeks. Contact information: [address] [place] [address] [phone]            Discharge Diagnoses:  1. 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 Diabetes mellitus type 2 3. Essential hypertension 4. Hyperlipidemia  Discharge Condition: improved Disposition: home with home health PT, occupational therapy, speech therapy  Diet recommendation: heart healthy, diabetic diet  Filed Weights   10/24/16 0806  Weight: 76.7 kg (169 lb)    History of present illness:  76 year old woman PMH diabetes, hypertension, presented with intermittent three-week or more historyof difficulty speaking, word finding problems, right-handed clumsiness, headaches.CT was worrisome for left MCA infarct. She was admitted for further evaluation of stroke and neurology consultation.  Hospital Course:  Was seen in consultation with neurology as well asoccupational, physical and speech. Individual issues as below. Hospitalization was uncomplicated.  Acute ischemic stroke left posterior MCA secondary to left MCA high-grade stenosis with right hand weakness and expressive dysphagia. LDL 81. 2-D echocardiogram unremarkable. Hemoglobin A1c 7.0%.2-D echocardiogram unremarkable. See imaging reports below. -dual antiplatelet therapy with Plavix and aspirin x3 months  recommended.Then likely Plavix alone.statin dose was increased.  Diabetes mellitus, hemoglobin A1c 7.0. -stable. Continue oral medications.  Essential hypertension -table. Continue amlodipine, lisinopril.  Hyperlipidemia. -Atorvastatin increased to 80 mg daily  Procedures Study Conclusions  - Left ventricle: The cavity size was normal. Wall thickness was   normal. Systolic function was vigorous. The estimated ejection   fraction was in the range of 65% to 70%. Wall motion was normal;   there were no regional wall motion abnormalities. Doppler   parameters are consistent with abnormal left ventricular   relaxation (grade 1 diastolic dysfunction).  Consultants:  Neurology  Today's assessment: S: feels well. No complaints. O: Vitals: afebrile. 98.6, 20, 92, 122/59, 96% on room air.   Constitutional. Appears calm, comfortable.  Cardiovascular. Regular rate and rhythm. No rub or gallop.  Respiratory. Clear to auscultation bilaterally. No wheezes, rales or rhonchi. Normal respiratory effort.  Psychiatric. Grossly normal mood and affect.  Basic metabolic panel unremarkable. Blood sugars stable. CBC unremarkable.  Discharge Instructions  Discharge Instructions    Ambulatory referral to Neurology    Complete by:  As directed    An appointment is requested in approximately: 6 weeks   Diet - low sodium heart healthy    Complete by:  As directed    Diet Carb Modified    Complete by:  As directed    Discharge instructions    Complete by:  As directed    Call your physician or seek immediate medical attention for weakness, numbness, difficulty speaking or swallowing or worsening of condition.   Increase activity slowly    Complete by:  As directed      Allergies as of 10/26/2016      Reactions   Influenza Vaccines Swelling   Arm was swollen in 2002  Sitagliptin Swelling   Guaifenesin Other (See Comments)   Unknown   Latex Other (See Comments)   Unknown   Other  Other (See Comments)   Band-Aid   Phenylephrine-pheniramine-dm Other (See Comments)   Unknown   Ultram [tramadol Hcl] Other (See Comments)   hallucinations   Escitalopram Oxalate Other (See Comments)   Hallucinations      Medication List    STOP taking these medications   ibuprofen 400 MG tablet Commonly known as:  ADVIL,MOTRIN   valsartan 320 MG tablet Commonly known as:  DIOVAN   ZITHROMAX PO     TAKE these medications   amLODipine 5 MG tablet Commonly known as:  NORVASC Take 5 mg by mouth daily.   aspirin 81 MG tablet Take 81 mg by mouth daily.   atorvastatin 80 MG tablet Commonly known as:  LIPITOR Take 1 tablet (80 mg total) by mouth at bedtime. What changed:  medication strength  how much to take  when to take this   clopidogrel 75 MG tablet Commonly known as:  PLAVIX Take 1 tablet (75 mg total) by mouth daily.   Colchicine 0.6 MG Caps Take 0.6 mg by mouth daily.   glyBURIDE 5 MG tablet Commonly known as:  DIABETA Take 5 mg by mouth 2 (two) times daily with a meal.   INVOKANA 300 MG Tabs tablet Generic drug:  canagliflozin Take 300 mg by mouth daily.   lisinopril 20 MG tablet Commonly known as:  PRINIVIL,ZESTRIL Take 20 mg by mouth daily.   meclizine 12.5 MG tablet Commonly known as:  ANTIVERT Take 12.5 mg by mouth 3 (three) times daily as needed for dizziness.   metFORMIN 1000 MG tablet Commonly known as:  GLUCOPHAGE Take 1,000 mg by mouth 2 (two) times daily with a meal.   TRULICITY Dunn Inject 1.5 mg into the skin once a week.   Vitamin D3 5000 units Caps Take by mouth.      Allergies  Allergen Reactions  . Influenza Vaccines Swelling    Arm was swollen in 2002   . Sitagliptin Swelling  . Guaifenesin Other (See Comments)    Unknown  . Latex Other (See Comments)    Unknown  . Other Other (See Comments)    Band-Aid  . Phenylephrine-Pheniramine-Dm Other (See Comments)    Unknown  . Ultram [Tramadol Hcl] Other (See Comments)      hallucinations  . Escitalopram Oxalate Other (See Comments)    Hallucinations    The results of significant diagnostics from this hospitalization (including imaging, microbiology, ancillary and laboratory) are listed below for reference.    Significant Diagnostic Studies: Ct Angio Head W Or Wo Contrast  Result Date: 10/24/2016 CLINICAL DATA:  Frontal headaches for 2 weeks. Extreme difficulty ambulating today. Follow-up LEFT MCA stroke and LEFT M1 stenosis versus thromboembolism. History of hypertension and diabetes. EXAM: CT ANGIOGRAPHY HEAD AND NECK CT PERFUSION BRAIN TECHNIQUE: Multidetector CT imaging of the head and neck was performed using the standard protocol during bolus administration of intravenous contrast. Multiplanar CT image reconstructions and MIPs were obtained to evaluate the vascular anatomy. Carotid stenosis measurements (when applicable) are obtained utilizing NASCET criteria, using the distal internal carotid diameter as the denominator. Multiphase CT imaging of the brain was performed following IV bolus contrast injection. Subsequent parametric perfusion maps were calculated using RAPID software. CONTRAST:  90 cc Isovue 370 COMPARISON:  MRI and MRA of the head October 24, 2016 at 1420 hours and CTA HEAD October 24, 2016 at  0851 hours FINDINGS: CTA NECK AORTIC ARCH: Normal appearance of the thoracic arch, normal branch pattern. Mild intimal thickening and calcific atherosclerosis. The origins of the innominate, left Common carotid artery and subclavian artery are widely patent. RIGHT CAROTID SYSTEM: Common carotid artery is patent, motion degraded. Retropharyngeal bifurcation, mild calcific atherosclerosis without hemodynamically significant stenosis by NASCET criteria. Patent internal carotid artery, tortuous course associated with hypertension. LEFT CAROTID SYSTEM: Common carotid artery is patent, motion degraded. Retropharyngeal bifurcation, mild calcific atherosclerosis without  hemodynamically significant stenosis by NASCET criteria. Patent internal carotid artery, tortuous course associated with hypertension. VERTEBRAL ARTERIES:Codominant vertebral artery's, widely patent. Mild extrinsic compression due to degenerative cervical spine. SKELETON: No acute osseous process though bone windows have not been submitted. Patient is edentulous. Moderate upper cervical spondylosis. OTHER NECK: Soft tissues of the neck are nonacute though, not tailored for evaluation. Moderate UPPER CHEST: Included lung apices are clear. No superior mediastinal lymphadenopathy. CTA HEAD ANTERIOR CIRCULATION: Patent cervical internal carotid arteries, petrous, cavernous and supra clinoid internal carotid arteries. Moderate calcific atherosclerosis carotid siphons. Severe stenosis LEFT supraclinoid segment. 2 mm segment of LEFT M1 severe stenosis within 2 mm of the origin. LEFT middle cerebral artery is patent distal to this stenosis. Patent anterior communicating artery. Anterior cerebral artery's are patent with moderate stenoses bilateral distal A2 segments. Severe stenosis proximal RIGHT M2 segment. No contrast extravasation or aneurysm. POSTERIOR CIRCULATION: Patent vertebral arteries, vertebrobasilar junction and basilar artery, as well as main branch vessels. Mild calcific atherosclerosis vertebral artery's. Patent posterior cerebral arteries, mild tandem stenoses. Tiny LEFT posterior communicating artery present. No contrast extravasation or aneurysm. VENOUS SINUSES: Major dural venous sinuses are patent though not tailored for evaluation on this angiographic examination. ANATOMIC VARIANTS: Common origin RIGHT PCA and RIGHT SCA. DELAYED PHASE: Faintly dense LEFT parietal cortices at site of known infarct. MIP images reviewed. CT Brain Perfusion Findings: CBF (<30%) Volume:  3 mL, though in contralateral hemisphere. Perfusion (Tmax>6.0s) volume: 51mL Mismatch Volume: 48mL Infarction Location:LEFT  temporoparietal. IMPRESSION: CTA NECK: 1. Atherosclerosis without hemodynamically significant stenosis or acute vascular process. CTA HEAD: 1. Short segment LEFT M1 severe stenosis ; no emergent large vessel occlusion. 2. Severe stenosis LEFT ICA supraclinoid segment and RIGHT proximal M2 segment. Moderate stenoses bilateral anterior cerebral artery's. 3. Enhancing subacute LEFT parietal/MCA territory infarct, less likely interval hemorrhage. CT PERFUSION: 1. LEFT temporoparietal penumbra, profusion shows no discrete infarct though by prior imaging, this is not accurate. Aortic Atherosclerosis (ICD10-I70.0). Electronically Signed   By: Awilda Metro M.D.   On: 10/24/2016 22:29   Dg Chest 2 View  Result Date: 10/24/2016 CLINICAL DATA:  Weakness. EXAM: CHEST  2 VIEW COMPARISON:  None. FINDINGS: Normal heart size and mediastinal contours. Subtle asymmetric density overlapping the heart in the AP view is not seen laterally consider transient artifactual. No acute infiltrate or edema. No effusion or pneumothorax. No acute osseous findings. EKG leads create artifact over the chest. IMPRESSION: No evidence of active disease. Electronically Signed   By: Marnee Spring M.D.   On: 10/24/2016 09:07   Ct Head Wo Contrast  Result Date: 10/24/2016 CLINICAL DATA:  Word-finding difficulty beginning 1 week ago. Unsteadiness. EXAM: CT HEAD WITHOUT CONTRAST TECHNIQUE: Contiguous axial images were obtained from the base of the skull through the vertex without intravenous contrast. COMPARISON:  None. FINDINGS: Brain: There is a small to moderate-sized posterior left MCA infarct in the inferior parietal region with well-defined cytotoxic edema and likely a small amount of petechial hemorrhage favoring an early subacute  time course. No lobar hematoma or mass effect is present. Mild cerebral atrophy is within normal limits for age. Scattered subcortical and deep cerebral white matter hypodensities bilaterally are nonspecific  but compatible with mild chronic small vessel ischemic disease. There is no midline shift, mass, or extra-axial fluid collection. Vascular: Calcified atherosclerosis at the skullbase. No hyperdense vessel. Skull: No fracture focal osseous lesion. Sinuses/Orbits: Visualized paranasal sinuses and mastoid air cells are clear. Visualized orbits are unremarkable. Other: None. IMPRESSION: 1. 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 Likely small amount of petechial hemorrhage. 2. Mild chronic small vessel ischemic disease. Electronically Signed   By: Sebastian Ache M.D.   On: 10/24/2016 09:01   Ct Angio Neck W Or Wo Contrast  Result Date: 10/24/2016 CLINICAL DATA:  Frontal headaches for 2 weeks. Extreme difficulty ambulating today. Follow-up LEFT MCA stroke and LEFT M1 stenosis versus thromboembolism. History of hypertension and diabetes. EXAM: CT ANGIOGRAPHY HEAD AND NECK CT PERFUSION BRAIN TECHNIQUE: Multidetector CT imaging of the head and neck was performed using the standard protocol during bolus administration of intravenous contrast. Multiplanar CT image reconstructions and MIPs were obtained to evaluate the vascular anatomy. Carotid stenosis measurements (when applicable) are obtained utilizing NASCET criteria, using the distal internal carotid diameter as the denominator. Multiphase CT imaging of the brain was performed following IV bolus contrast injection. Subsequent parametric perfusion maps were calculated using RAPID software. CONTRAST:  90 cc Isovue 370 COMPARISON:  MRI and MRA of the head October 24, 2016 at 1420 hours and CTA HEAD October 24, 2016 at 0851 hours FINDINGS: CTA NECK AORTIC ARCH: Normal appearance of the thoracic arch, normal branch pattern. Mild intimal thickening and calcific atherosclerosis. The origins of the innominate, left Common carotid artery and subclavian artery are widely patent. RIGHT CAROTID SYSTEM: Common carotid artery is patent, motion degraded. Retropharyngeal  bifurcation, mild calcific atherosclerosis without hemodynamically significant stenosis by NASCET criteria. Patent internal carotid artery, tortuous course associated with hypertension. LEFT CAROTID SYSTEM: Common carotid artery is patent, motion degraded. Retropharyngeal bifurcation, mild calcific atherosclerosis without hemodynamically significant stenosis by NASCET criteria. Patent internal carotid artery, tortuous course associated with hypertension. VERTEBRAL ARTERIES:Codominant vertebral artery's, widely patent. Mild extrinsic compression due to degenerative cervical spine. SKELETON: No acute osseous process though bone windows have not been submitted. Patient is edentulous. Moderate upper cervical spondylosis. OTHER NECK: Soft tissues of the neck are nonacute though, not tailored for evaluation. Moderate UPPER CHEST: Included lung apices are clear. No superior mediastinal lymphadenopathy. CTA HEAD ANTERIOR CIRCULATION: Patent cervical internal carotid arteries, petrous, cavernous and supra clinoid internal carotid arteries. Moderate calcific atherosclerosis carotid siphons. Severe stenosis LEFT supraclinoid segment. 2 mm segment of LEFT M1 severe stenosis within 2 mm of the origin. LEFT middle cerebral artery is patent distal to this stenosis. Patent anterior communicating artery. Anterior cerebral artery's are patent with moderate stenoses bilateral distal A2 segments. Severe stenosis proximal RIGHT M2 segment. No contrast extravasation or aneurysm. POSTERIOR CIRCULATION: Patent vertebral arteries, vertebrobasilar junction and basilar artery, as well as main branch vessels. Mild calcific atherosclerosis vertebral artery's. Patent posterior cerebral arteries, mild tandem stenoses. Tiny LEFT posterior communicating artery present. No contrast extravasation or aneurysm. VENOUS SINUSES: Major dural venous sinuses are patent though not tailored for evaluation on this angiographic examination. ANATOMIC VARIANTS:  Common origin RIGHT PCA and RIGHT SCA. DELAYED PHASE: Faintly dense LEFT parietal cortices at site of known infarct. MIP images reviewed. CT Brain Perfusion Findings: CBF (<30%) Volume:  3 mL, though in contralateral hemisphere.  Perfusion (Tmax>6.0s) volume: 51mL Mismatch Volume: 48mL Infarction Location:LEFT temporoparietal. IMPRESSION: CTA NECK: 1. Atherosclerosis without hemodynamically significant stenosis or acute vascular process. CTA HEAD: 1. Short segment LEFT M1 severe stenosis ; no emergent large vessel occlusion. 2. Severe stenosis LEFT ICA supraclinoid segment and RIGHT proximal M2 segment. Moderate stenoses bilateral anterior cerebral artery's. 3. Enhancing subacute LEFT parietal/MCA territory infarct, less likely interval hemorrhage. CT PERFUSION: 1. LEFT temporoparietal penumbra, profusion shows no discrete infarct though by prior imaging, this is not accurate. Aortic Atherosclerosis (ICD10-I70.0). Electronically Signed   By: Awilda Metro M.D.   On: 10/24/2016 22:29   Mr [name] Neck W Wo Contrast  Addendum Date: 10/24/2016   ADDENDUM REPORT: 10/24/2016 16:22 ADDENDUM: These results were called by telephone at the time of interpretation on 10/24/2016 at 4:21 pm to Dr. Shelly Flatten , who verbally acknowledged these results. Electronically Signed   By: Deatra Robinson M.D.   On: 10/24/2016 16:22   Result Date: 10/24/2016 CLINICAL DATA:  Focal neuro deficits for greater than 6 hours. EXAM: MRI HEAD WITHOUT AND WITH CONTRAST MRA HEAD WITHOUT CONTRAST MRA NECK WITHOUT AND WITH CONTRAST TECHNIQUE: Multiplanar, multiecho pulse sequences of the brain and surrounding structures were obtained without and with intravenous contrast. Angiographic images of the Circle of Willis were obtained using MRA technique without intravenous contrast. Angiographic images of the neck were obtained using MRA technique without and with intravenous contrast. Carotid stenosis measurements (when applicable) are obtained  utilizing NASCET criteria, using the distal internal carotid diameter as the denominator. CONTRAST:  17mL MULTIHANCE GADOBENATE DIMEGLUMINE 529 MG/ML IV SOLN COMPARISON:  Head CT 10/24/2016 FINDINGS: MRI HEAD FINDINGS Brain: The midline structures are normal. Multiple areas of diffusion restriction scattered through the left MCA distribution, greatest in the left parietal lobe. No contralateral foci of ischemia. There is edema within the same distribution as the ischemia. Minimal periventricular hyperintense T2 weighted signal. There is hyperintense T1 weighted signal along the cortical surface of the left parietoccipital infarcted territory without associated susceptibility abnormality. No mass lesion. No hydrocephalus, age advanced atrophy or lobar predominant volume loss. Skull and upper cervical spine: The visualized skull base, calvarium, upper cervical spine and extracranial soft tissues are normal. Sinuses/Orbits: No fluid levels or advanced mucosal thickening. No mastoid effusion. Normal orbits. MRA HEAD FINDINGS Intracranial internal carotid arteries: Normal. Anterior cerebral arteries: Normal. Middle cerebral arteries: There is a focal area of diminished flow related enhancement in the proximal left M1 segment. Enhancement is normal within the left MCA distally. Posterior communicating arteries: Absent bilaterally. Posterior cerebral arteries: Normal. Basilar artery: Normal. Vertebral arteries: Codominant. Normal. Superior cerebellar arteries: Normal. Anterior inferior cerebellar arteries: Normal. Posterior inferior cerebellar arteries: Normal. MRA NECK FINDINGS There is 50% stenosis of the proximal left internal carotid artery just distal to the carotid bifurcation. No other carotid stenosis. The codominant vertebral arteries are normal. IMPRESSION: 1. Scattered areas of acute ischemia within the left MCA distribution, greatest in the posterior left parietal and temporal lobes. No associated mass effect.  2. Area of hyperintense T1 weighted signal along the surface of the left posterior parietotemporal infarct likely indicates petechial hemorrhage. No space-occupying parenchymal hematoma. 3. Diminished flow related enhancement within the proximal left MCA M1 segment with normal flow related enhancement distally likely indicates subtotal occlusion. 4. Approximately 50% stenosis of the proximal left internal carotid artery. Electronically Signed: By: Deatra Robinson M.D. On: 10/24/2016 15:54   Mr Brain Wo Contrast  Addendum Date: 10/24/2016   ADDENDUM REPORT: 10/24/2016 16:22 ADDENDUM:  These results were called by telephone at the time of interpretation on 10/24/2016 at 4:21 pm to Dr. Shelly Flatten , who verbally acknowledged these results. Electronically Signed   By: Deatra Robinson M.D.   On: 10/24/2016 16:22   Result Date: 10/24/2016 CLINICAL DATA:  Focal neuro deficits for greater than 6 hours. EXAM: MRI HEAD WITHOUT AND WITH CONTRAST MRA HEAD WITHOUT CONTRAST MRA NECK WITHOUT AND WITH CONTRAST TECHNIQUE: Multiplanar, multiecho pulse sequences of the brain and surrounding structures were obtained without and with intravenous contrast. Angiographic images of the Circle of Willis were obtained using MRA technique without intravenous contrast. Angiographic images of the neck were obtained using MRA technique without and with intravenous contrast. Carotid stenosis measurements (when applicable) are obtained utilizing NASCET criteria, using the distal internal carotid diameter as the denominator. CONTRAST:  17mL MULTIHANCE GADOBENATE DIMEGLUMINE 529 MG/ML IV SOLN COMPARISON:  Head CT 10/24/2016 FINDINGS: MRI HEAD FINDINGS Brain: The midline structures are normal. Multiple areas of diffusion restriction scattered through the left MCA distribution, greatest in the left parietal lobe. No contralateral foci of ischemia. There is edema within the same distribution as the ischemia. Minimal periventricular hyperintense T2  weighted signal. There is hyperintense T1 weighted signal along the cortical surface of the left parietoccipital infarcted territory without associated susceptibility abnormality. No mass lesion. No hydrocephalus, age advanced atrophy or lobar predominant volume loss. Skull and upper cervical spine: The visualized skull base, calvarium, upper cervical spine and extracranial soft tissues are normal. Sinuses/Orbits: No fluid levels or advanced mucosal thickening. No mastoid effusion. Normal orbits. MRA HEAD FINDINGS Intracranial internal carotid arteries: Normal. Anterior cerebral arteries: Normal. Middle cerebral arteries: There is a focal area of diminished flow related enhancement in the proximal left M1 segment. Enhancement is normal within the left MCA distally. Posterior communicating arteries: Absent bilaterally. Posterior cerebral arteries: Normal. Basilar artery: Normal. Vertebral arteries: Codominant. Normal. Superior cerebellar arteries: Normal. Anterior inferior cerebellar arteries: Normal. Posterior inferior cerebellar arteries: Normal. MRA NECK FINDINGS There is 50% stenosis of the proximal left internal carotid artery just distal to the carotid bifurcation. No other carotid stenosis. The codominant vertebral arteries are normal. IMPRESSION: 1. Scattered areas of acute ischemia within the left MCA distribution, greatest in the posterior left parietal and temporal lobes. No associated mass effect. 2. Area of hyperintense T1 weighted signal along the surface of the left posterior parietotemporal infarct likely indicates petechial hemorrhage. No space-occupying parenchymal hematoma. 3. Diminished flow related enhancement within the proximal left MCA M1 segment with normal flow related enhancement distally likely indicates subtotal occlusion. 4. Approximately 50% stenosis of the proximal left internal carotid artery. Electronically Signed: By: Deatra Robinson M.D. On: 10/24/2016 15:54   Ct Cerebral Perfusion  W Contrast  Result Date: 10/24/2016 CLINICAL DATA:  Frontal headaches for 2 weeks. Extreme difficulty ambulating today. Follow-up LEFT MCA stroke and LEFT M1 stenosis versus thromboembolism. History of hypertension and diabetes. EXAM: CT ANGIOGRAPHY HEAD AND NECK CT PERFUSION BRAIN TECHNIQUE: Multidetector CT imaging of the head and neck was performed using the standard protocol during bolus administration of intravenous contrast. Multiplanar CT image reconstructions and MIPs were obtained to evaluate the vascular anatomy. Carotid stenosis measurements (when applicable) are obtained utilizing NASCET criteria, using the distal internal carotid diameter as the denominator. Multiphase CT imaging of the brain was performed following IV bolus contrast injection. Subsequent parametric perfusion maps were calculated using RAPID software. CONTRAST:  90 cc Isovue 370 COMPARISON:  MRI and MRA of the head October 24, 2016  at 1420 hours and CTA HEAD October 24, 2016 at 0851 hours FINDINGS: CTA NECK AORTIC ARCH: Normal appearance of the thoracic arch, normal branch pattern. Mild intimal thickening and calcific atherosclerosis. The origins of the innominate, left Common carotid artery and subclavian artery are widely patent. RIGHT CAROTID SYSTEM: Common carotid artery is patent, motion degraded. Retropharyngeal bifurcation, mild calcific atherosclerosis without hemodynamically significant stenosis by NASCET criteria. Patent internal carotid artery, tortuous course associated with hypertension. LEFT CAROTID SYSTEM: Common carotid artery is patent, motion degraded. Retropharyngeal bifurcation, mild calcific atherosclerosis without hemodynamically significant stenosis by NASCET criteria. Patent internal carotid artery, tortuous course associated with hypertension. VERTEBRAL ARTERIES:Codominant vertebral artery's, widely patent. Mild extrinsic compression due to degenerative cervical spine. SKELETON: No acute osseous process though  bone windows have not been submitted. Patient is edentulous. Moderate upper cervical spondylosis. OTHER NECK: Soft tissues of the neck are nonacute though, not tailored for evaluation. Moderate UPPER CHEST: Included lung apices are clear. No superior mediastinal lymphadenopathy. CTA HEAD ANTERIOR CIRCULATION: Patent cervical internal carotid arteries, petrous, cavernous and supra clinoid internal carotid arteries. Moderate calcific atherosclerosis carotid siphons. Severe stenosis LEFT supraclinoid segment. 2 mm segment of LEFT M1 severe stenosis within 2 mm of the origin. LEFT middle cerebral artery is patent distal to this stenosis. Patent anterior communicating artery. Anterior cerebral artery's are patent with moderate stenoses bilateral distal A2 segments. Severe stenosis proximal RIGHT M2 segment. No contrast extravasation or aneurysm. POSTERIOR CIRCULATION: Patent vertebral arteries, vertebrobasilar junction and basilar artery, as well as main branch vessels. Mild calcific atherosclerosis vertebral artery's. Patent posterior cerebral arteries, mild tandem stenoses. Tiny LEFT posterior communicating artery present. No contrast extravasation or aneurysm. VENOUS SINUSES: Major dural venous sinuses are patent though not tailored for evaluation on this angiographic examination. ANATOMIC VARIANTS: Common origin RIGHT PCA and RIGHT SCA. DELAYED PHASE: Faintly dense LEFT parietal cortices at site of known infarct. MIP images reviewed. CT Brain Perfusion Findings: CBF (<30%) Volume:  3 mL, though in contralateral hemisphere. Perfusion (Tmax>6.0s) volume: 51mL Mismatch Volume: 48mL Infarction Location:LEFT temporoparietal. IMPRESSION: CTA NECK: 1. Atherosclerosis without hemodynamically significant stenosis or acute vascular process. CTA HEAD: 1. Short segment LEFT M1 severe stenosis ; no emergent large vessel occlusion. 2. Severe stenosis LEFT ICA supraclinoid segment and RIGHT proximal M2 segment. Moderate stenoses  bilateral anterior cerebral artery's. 3. Enhancing subacute LEFT parietal/MCA territory infarct, less likely interval hemorrhage. CT PERFUSION: 1. LEFT temporoparietal penumbra, profusion shows no discrete infarct though by prior imaging, this is not accurate. Aortic Atherosclerosis (ICD10-I70.0). Electronically Signed   By: Awilda Metro M.D.   On: 10/24/2016 22:29   Mr [name] Head Wo Contrast  Addendum Date: 10/24/2016   ADDENDUM REPORT: 10/24/2016 16:22 ADDENDUM: These results were called by telephone at the time of interpretation on 10/24/2016 at 4:21 pm to Dr. Shelly Flatten , who verbally acknowledged these results. Electronically Signed   By: Deatra Robinson M.D.   On: 10/24/2016 16:22   Result Date: 10/24/2016 CLINICAL DATA:  Focal neuro deficits for greater than 6 hours. EXAM: MRI HEAD WITHOUT AND WITH CONTRAST MRA HEAD WITHOUT CONTRAST MRA NECK WITHOUT AND WITH CONTRAST TECHNIQUE: Multiplanar, multiecho pulse sequences of the brain and surrounding structures were obtained without and with intravenous contrast. Angiographic images of the Circle of Willis were obtained using MRA technique without intravenous contrast. Angiographic images of the neck were obtained using MRA technique without and with intravenous contrast. Carotid stenosis measurements (when applicable) are obtained utilizing NASCET criteria, using the distal internal carotid diameter  as the denominator. CONTRAST:  17mL MULTIHANCE GADOBENATE DIMEGLUMINE 529 MG/ML IV SOLN COMPARISON:  Head CT 10/24/2016 FINDINGS: MRI HEAD FINDINGS Brain: The midline structures are normal. Multiple areas of diffusion restriction scattered through the left MCA distribution, greatest in the left parietal lobe. No contralateral foci of ischemia. There is edema within the same distribution as the ischemia. Minimal periventricular hyperintense T2 weighted signal. There is hyperintense T1 weighted signal along the cortical surface of the left parietoccipital  infarcted territory without associated susceptibility abnormality. No mass lesion. No hydrocephalus, age advanced atrophy or lobar predominant volume loss. Skull and upper cervical spine: The visualized skull base, calvarium, upper cervical spine and extracranial soft tissues are normal. Sinuses/Orbits: No fluid levels or advanced mucosal thickening. No mastoid effusion. Normal orbits. MRA HEAD FINDINGS Intracranial internal carotid arteries: Normal. Anterior cerebral arteries: Normal. Middle cerebral arteries: There is a focal area of diminished flow related enhancement in the proximal left M1 segment. Enhancement is normal within the left MCA distally. Posterior communicating arteries: Absent bilaterally. Posterior cerebral arteries: Normal. Basilar artery: Normal. Vertebral arteries: Codominant. Normal. Superior cerebellar arteries: Normal. Anterior inferior cerebellar arteries: Normal. Posterior inferior cerebellar arteries: Normal. MRA NECK FINDINGS There is 50% stenosis of the proximal left internal carotid artery just distal to the carotid bifurcation. No other carotid stenosis. The codominant vertebral arteries are normal. IMPRESSION: 1. Scattered areas of acute ischemia within the left MCA distribution, greatest in the posterior left parietal and temporal lobes. No associated mass effect. 2. Area of hyperintense T1 weighted signal along the surface of the left posterior parietotemporal infarct likely indicates petechial hemorrhage. No space-occupying parenchymal hematoma. 3. Diminished flow related enhancement within the proximal left MCA M1 segment with normal flow related enhancement distally likely indicates subtotal occlusion. 4. Approximately 50% stenosis of the proximal left internal carotid artery. Electronically Signed: By: Deatra Robinson M.D. On: 10/24/2016 15:54   Labs: Basic Metabolic Panel:  Recent Labs Lab 10/24/16 0831 10/25/16 0447 10/26/16 0526  NA 136 138 137  K 4.4 3.8 4.4  CL  110 108 104  CO2 19* 20* 23  GLUCOSE 142* 131* 159*  BUN 18 13 12   CREATININE 0.61 0.63 0.60  CALCIUM 9.6 9.2 9.7   Liver Function Tests:  Recent Labs Lab 10/24/16 0831 10/25/16 0447  AST 23 18  ALT 17 16  ALKPHOS 74 70  BILITOT 0.5 0.4  PROT 7.6 6.7  ALBUMIN 3.5 3.0*   CBC:  Recent Labs Lab 10/24/16 0831 10/25/16 0447 10/26/16 0526  WBC 6.7 6.3 5.6  NEUTROABS 3.1  --   --   HGB 12.2 11.9* 11.3*  HCT 38.1 37.4 36.6  MCV 86.4 86.8 86.9  PLT 202 194 191   Cardiac Enzymes:  Recent Labs Lab 10/24/16 0831  TROPONINI <0.03    CBG:  Recent Labs Lab 10/25/16 1116 10/25/16 1653 10/25/16 2112 10/26/16 0617 10/26/16 1129  GLUCAP 169* 184* 173* 183* 207*    Principal Problem:   CVA (cerebral vascular accident) (HCC) Active Problems:   Essential hypertension   Diabetes mellitus with complication (HCC)   Chronic pain   Time coordinating discharge: 45 minutes  Signed:  Brendia Sacks, MD [facility] 10/26/2016, 2:56 PM

## 2016-10-26 NOTE — Progress Notes (Signed)
  Speech Language Pathology Treatment: Cognitive-Linquistic  Patient Details Name: Jill Howard MRN: [id number] DOB: Jul 05, 1940 Today's Date: 10/26/2016 Time: 1324-4010 SLP Time Calculation (min) (ACUTE ONLY): 12 min  Assessment / Plan / Recommendation Clinical Impression  Skilled treatment session focused on aphasia goals. SLP facilitated session by providing Min A to supervision goals for use of word finding strategies at the simple conversation level. Pt with semantic paraphasias, she was self-aware and self-corrected. Continue to recommend HHST for follow up.    HPI HPI: Pt is a 76 y/o female who presents with R sided weakness, decreased fine motor/grip strength/dysarthria of R hand. MRI showed scattered areas of acute ischemia within the L MCA distribution, greatest in the posterior L parietal and temporal lobes. PMH significant for vertigo, fibromyalgia, DMII.      SLP Plan  Continue with current plan of care       Recommendations                   Follow up Recommendations: Outpatient SLP;24 hour supervision/assistance SLP Visit Diagnosis: Aphasia (R47.01) Plan: Continue with current plan of care       GO                Jill Howard 10/26/2016, 3:46 PM

## 2016-10-26 NOTE — Care Management Note (Addendum)
Case Management Note  Patient Details  Name: Jill Howard MRN: [id number] Date of Birth: Jul 29, 1940  Subjective/Objective:                    Action/Plan:  Pt discharging home with orders for [facility] services. CM met with the patient and her daughter and provided a list of [facility] agencies. They selected [place]. Jermaine with [facility] notified and accepted the referral.  Patient states her PCP is Dr Bebe Liter. Information given to [facility]. Daughter to provide transportation home.   Expected Discharge Date:  10/26/16               Expected Discharge Plan:  [place]  In-House Referral:     Discharge planning Services  CM Consult  Post Acute Care Choice:  Home Health Choice offered to:  Patient  DME Arranged:    DME Agency:     HH Arranged:  PT, OT, Speech Therapy [place] Agency:  [place]  Status of Service:  Completed, signed off  If discussed at [place] of Stay Meetings, dates discussed:    Additional Comments:  Pollie Friar, RN 10/26/2016, 3:23 PM

## 2016-10-26 NOTE — Progress Notes (Signed)
Pt discharged via wheelchair accompanied by nurse tech with belongings. Pt in stable condition.

## 2016-10-26 NOTE — Progress Notes (Signed)
Pt and daughter given discharge instructions and prescriptions for lipitor and plavix. All questions answered.

## 2016-10-26 NOTE — Research (Signed)
[id number] Informed Consent   Subject Name: Jill Howard  Subject met inclusion and exclusion criteria.  The informed consent form, study requirements and expectations were reviewed with the subject and questions and concerns were addressed prior to the signing of the consent form.  The subject verbalized understanding of the trail requirements.  The subject agreed to participate in the STROKE-AF trial and signed the informed consent.  The informed consent was obtained prior to performance of any protocol-specific procedures for the subject.  A copy of the signed informed consent was given to the subject and a copy was placed in the subject's medical record. Patient's last know normal was 12/22/2016 and this is an acute Stroke as documented per CT/MRI. Dr. Leonie Man states she is within Inclusion window. Patient is Randomized to STANDARD OF CARE.  Hedrick,Haylee Mcanany W 10/26/2016, 12:30

## 2016-10-26 NOTE — Discharge Instructions (Signed)
Heart-Healthy Eating Plan Heart-healthy meal planning includes:  Limiting unhealthy fats.  Increasing healthy fats.  Making other small dietary changes.  You may need to talk with your doctor or a diet specialist (dietitian) to create an eating plan that is right for you. What types of fat should I choose?  Choose healthy fats. These include olive oil and canola oil, flaxseeds, walnuts, almonds, and seeds.  Eat more omega-3 fats. These include salmon, mackerel, sardines, tuna, flaxseed oil, and ground flaxseeds. Try to eat fish at least twice each week.  Limit saturated fats. ? Saturated fats are often found in animal products, such as meats, butter, and cream. ? Plant sources of saturated fats include palm oil, palm kernel oil, and coconut oil.  Avoid foods with partially hydrogenated oils in them. These include stick margarine, some tub margarines, cookies, crackers, and other baked goods. These contain trans fats. What general guidelines do I need to follow?  Check food labels carefully. Identify foods with trans fats or high amounts of saturated fat.  Fill one half of your plate with vegetables and green salads. Eat 4-5 servings of vegetables per day. A serving of vegetables is: ? 1 cup of raw leafy vegetables. ?  cup of raw or cooked cut-up vegetables. ?  cup of vegetable juice.  Fill one fourth of your plate with whole grains. Look for the word "whole" as the first word in the ingredient list.  Fill one fourth of your plate with lean protein foods.  Eat 4-5 servings of fruit per day. A serving of fruit is: ? One medium whole fruit. ?  cup of dried fruit. ?  cup of fresh, frozen, or canned fruit. ?  cup of 100% fruit juice.  Eat more foods that contain soluble fiber. These include apples, broccoli, carrots, beans, peas, and barley. Try to get 20-30 g of fiber per day.  Eat more home-cooked food. Eat less restaurant, buffet, and fast food.  Limit or avoid  alcohol.  Limit foods high in starch and sugar.  Avoid fried foods.  Avoid frying your food. Try baking, boiling, grilling, or broiling it instead. You can also reduce fat by: ? Removing the skin from poultry. ? Removing all visible fats from meats. ? Skimming the fat off of stews, soups, and gravies before serving them. ? Steaming vegetables in water or broth.  Lose weight if you are overweight.  Eat 4-5 servings of nuts, legumes, and seeds per week: ? One serving of dried beans or legumes equals  cup after being cooked. ? One serving of nuts equals 1 ounces. ? One serving of seeds equals  ounce or one tablespoon.  You may need to keep track of how much salt or sodium you eat. This is especially true if you have high blood pressure. Talk with your doctor or dietitian to get more information. What foods can I eat? Grains Breads, including [place], white, pita, wheat, raisin, rye, oatmeal, and [place]. Tortillas that are neither fried nor made with lard or trans fat. Low-fat rolls, including hotdog and hamburger buns and English muffins. Biscuits. Muffins. Waffles. Pancakes. Light popcorn. Whole-grain cereals. Flatbread. Melba toast. Pretzels. Breadsticks. Rusks. Low-fat snacks. Low-fat crackers, including oyster, saltine, matzo, graham, animal, and rye. Rice and pasta, including brown rice and pastas that are made with whole wheat. Vegetables All vegetables. Fruits All fruits, but limit coconut. Meats and Other Protein Sources Lean, well-trimmed beef, veal, pork, and lamb. Chicken and [place] without skin. All fish and shellfish.  Wild duck, rabbit, pheasant, and venison. Egg whites or low-cholesterol egg substitutes. Dried beans, peas, lentils, and tofu. Seeds and most nuts. Dairy Low-fat or nonfat cheeses, including ricotta, string, and mozzarella. Skim or 1% milk that is liquid, powdered, or evaporated. Buttermilk that is made with low-fat milk. Nonfat or low-fat  yogurt. Beverages Mineral water. Diet carbonated beverages. Sweets and Desserts Sherbets and fruit ices. Honey, jam, marmalade, jelly, and syrups. Meringues and gelatins. Pure sugar candy, such as hard candy, jelly beans, gumdrops, mints, marshmallows, and small amounts of dark chocolate. [employer]. Eat all sweets and desserts in moderation. Fats and Oils Nonhydrogenated (trans-free) margarines. Vegetable oils, including soybean, sesame, sunflower, olive, peanut, safflower, corn, canola, and cottonseed. Salad dressings or mayonnaise made with a vegetable oil. Limit added fats and oils that you use for cooking, baking, salads, and as spreads. Other Cocoa powder. Coffee and tea. All seasonings and condiments. The items listed above may not be a complete list of recommended foods or beverages. Contact your dietitian for more options. What foods are not recommended? Grains Breads that are made with saturated or trans fats, oils, or whole milk. Croissants. Butter rolls. Cheese breads. Sweet rolls. Donuts. Buttered popcorn. Chow mein noodles. High-fat crackers, such as cheese or butter crackers. Meats and Other Protein Sources Fatty meats, such as hotdogs, short ribs, sausage, spareribs, bacon, rib eye roast or steak, and mutton. High-fat deli meats, such as salami and bologna. Caviar. Domestic duck and goose. Organ meats, such as kidney, liver, sweetbreads, and heart. Dairy Cream, sour cream, cream cheese, and creamed cottage cheese. Whole-milk cheeses, including blue (bleu), [address], [place], [place], [address], [place], [address], cheddar, [place], and [place]. Whole or 2% milk that is liquid, evaporated, or condensed. Whole buttermilk. Cream sauce or high-fat cheese sauce. Yogurt that is made from whole milk. Beverages Regular sodas and juice drinks with added sugar. Sweets and Desserts Frosting. Pudding. Cookies. Cakes other than angel food cake. Candy that has milk chocolate or white  chocolate, hydrogenated fat, butter, coconut, or unknown ingredients. Buttered syrups. Full-fat ice cream or ice cream drinks. Fats and Oils Gravy that has suet, meat fat, or shortening. Cocoa butter, hydrogenated oils, palm oil, coconut oil, palm kernel oil. These can often be found in baked products, candy, fried foods, nondairy creamers, and whipped toppings. Solid fats and shortenings, including bacon fat, salt pork, lard, and butter. Nondairy cream substitutes, such as coffee creamers and sour cream substitutes. Salad dressings that are made of unknown oils, cheese, or sour cream. The items listed above may not be a complete list of foods and beverages to avoid. Contact your dietitian for more information. This information is not intended to replace advice given to you by your health care provider. Make sure you discuss any questions you have with your health care provider. Document Released: 07/12/2011 Document Revised: 06/18/2015 Document Reviewed: 07/04/2013 Elsevier Interactive Patient Education  2018 [employer]. Low-Sodium Eating Plan Sodium, which is an element that makes up salt, helps you maintain a healthy balance of fluids in your body. Too much sodium can increase your blood pressure and cause fluid and waste to be held in your body. Your health care provider or dietitian may recommend following this plan if you have high blood pressure (hypertension), kidney disease, liver disease, or heart failure. Eating less sodium can help lower your blood pressure, reduce swelling, and protect your heart, liver, and kidneys. What are tips for following this plan? General guidelines  Most people on this plan  should limit their sodium intake to 1,500-2,000 mg (milligrams) of sodium each day. Reading food labels  The Nutrition Facts label lists the amount of sodium in one serving of the food. If you eat more than one serving, you must multiply the listed amount of sodium by the number of  servings.  Choose foods with less than 140 mg of sodium per serving.  Avoid foods with 300 mg of sodium or more per serving. Shopping  Look for lower-sodium products, often labeled as "low-sodium" or "no salt added."  Always check the sodium content even if foods are labeled as "unsalted" or "no salt added".  Buy fresh foods. ? Avoid canned foods and premade or frozen meals. ? Avoid canned, cured, or processed meats  Buy breads that have less than 80 mg of sodium per slice. Cooking  Eat more home-cooked food and less restaurant, buffet, and fast food.  Avoid adding salt when cooking. Use salt-free seasonings or herbs instead of table salt or sea salt. Check with your health care provider or pharmacist before using salt substitutes.  Cook with plant-based oils, such as canola, sunflower, or olive oil. Meal planning  When eating at a restaurant, ask that your food be prepared with less salt or no salt, if possible.  Avoid foods that contain MSG (monosodium glutamate). MSG is sometimes added to [place] food, bouillon, and some canned foods. What foods are recommended? The items listed may not be a complete list. Talk with your dietitian about what dietary choices are best for you. Grains Low-sodium cereals, including oats, puffed wheat and rice, and shredded wheat. Low-sodium crackers. Unsalted rice. Unsalted pasta. Low-sodium bread. Whole-grain breads and whole-grain pasta. Vegetables Fresh or frozen vegetables. "No salt added" canned vegetables. "No salt added" tomato sauce and paste. Low-sodium or reduced-sodium tomato and vegetable juice. Fruits Fresh, frozen, or canned fruit. Fruit juice. Meats and other protein foods Fresh or frozen (no salt added) meat, poultry, seafood, and fish. Low-sodium canned tuna and salmon. Unsalted nuts. Dried peas, beans, and lentils without added salt. Unsalted canned beans. Eggs. Unsalted nut butters. Dairy Milk. Soy milk. Cheese that is  naturally low in sodium, such as ricotta cheese, fresh mozzarella, or Swiss cheese Low-sodium or reduced-sodium cheese. Cream cheese. Yogurt. Fats and oils Unsalted butter. Unsalted margarine with no trans fat. Vegetable oils such as canola or olive oils. Seasonings and other foods Fresh and dried herbs and spices. Salt-free seasonings. Low-sodium mustard and ketchup. Sodium-free salad dressing. Sodium-free light mayonnaise. Fresh or refrigerated horseradish. Lemon juice. Vinegar. Homemade, reduced-sodium, or low-sodium soups. Unsalted popcorn and pretzels. Low-salt or salt-free chips. What foods are not recommended? The items listed may not be a complete list. Talk with your dietitian about what dietary choices are best for you. Grains Instant hot cereals. Bread stuffing, pancake, and biscuit mixes. Croutons. Seasoned rice or pasta mixes. Noodle soup cups. Boxed or frozen macaroni and cheese. Regular salted crackers. Self-rising flour. Vegetables Sauerkraut, pickled vegetables, and relishes. Olives. [place] fries. Onion rings. Regular canned vegetables (not low-sodium or reduced-sodium). Regular canned tomato sauce and paste (not low-sodium or reduced-sodium). Regular tomato and vegetable juice (not low-sodium or reduced-sodium). Frozen vegetables in sauces. Meats and other protein foods Meat or fish that is salted, canned, smoked, spiced, or pickled. Bacon, ham, sausage, hotdogs, corned beef, chipped beef, packaged lunch meats, salt pork, jerky, pickled herring, anchovies, regular canned tuna, sardines, salted nuts. Dairy Processed cheese and cheese spreads. Cheese curds. Blue cheese. Feta cheese. String cheese. Regular cottage cheese.  Buttermilk. Canned milk. Fats and oils Salted butter. Regular margarine. Ghee. Bacon fat. Seasonings and other foods Onion salt, garlic salt, seasoned salt, table salt, and sea salt. Canned and packaged gravies. Worcestershire sauce. Tartar sauce. Barbecue sauce.  Teriyaki sauce. Soy sauce, including reduced-sodium. Steak sauce. Fish sauce. Oyster sauce. Cocktail sauce. Horseradish that you find on the shelf. Regular ketchup and mustard. Meat flavorings and tenderizers. Bouillon cubes. Hot sauce and Tabasco sauce. Premade or packaged marinades. Premade or packaged taco seasonings. Relishes. Regular salad dressings. Salsa. Potato and tortilla chips. Corn chips and puffs. Salted popcorn and pretzels. Canned or dried soups. Pizza. Frozen entrees and pot pies. Summary  Eating less sodium can help lower your blood pressure, reduce swelling, and protect your heart, liver, and kidneys.  Most people on this plan should limit their sodium intake to 1,500-2,000 mg (milligrams) of sodium each day.  Canned, boxed, and frozen foods are high in sodium. Restaurant foods, fast foods, and pizza are also very high in sodium. You also get sodium by adding salt to food.  Try to cook at home, eat more fresh fruits and vegetables, and eat less fast food, canned, processed, or prepared foods. This information is not intended to replace advice given to you by your health care provider. Make sure you discuss any questions you have with your health care provider. Document Released: 07/02/2001 Document Revised: 01/04/2016 Document Reviewed: 01/04/2016 Elsevier Interactive Patient Education  2017 [employer].

## 2016-10-26 NOTE — Progress Notes (Signed)
STROKE TEAM PROGRESS NOTE   HISTORY OF PRESENT ILLNESS (per record) Jill Howard is a 76 y.o. female was past medical history of diabetes, hypertension, hyperlipidemia, fibromyalgia, ?vascular dementia (secondary to poorly controlled DM) who was in her usual state of health about 2 weeks ago, which is when she started noticing some difficulty using her right hand. She found it hard to use utensils. She had been having a hard time walking at times with leg weakness. These symptoms were off and on and completely resolved in between, hence she did not seek medical attention. She also has had frontal headache ongoing for the past 2 weeks, which is different than her usual migraines. This morning around 3 AM, she had extreme difficulty walking, could not support her weight and had word finding difficulty, slurred speech and is what prompted the family to bring her to the emergency room at [place] for [place]. She was evaluated clinically and  Transferred to [facility] for an MRI/MRA brain. MRI of the brain showed strokes in the posterior division of the left MCA territory. The MRA of the brain also showed stenosis versus subocclusive thrombus in the left M1.   LKW: at least days ago if not 2 weeks ago. tpa given?: no, outside the window Premorbid modified Rankin scale (mRS): * 0   SUBJECTIVE (INTERVAL HISTORY) Her husband is at the bedside. Patient is doing well and has no complaints. She wants to participate in Stroke Afib trial.  OBJECTIVE Temp:  [97.8 F (36.6 C)-98.9 F (37.2 C)] 98.6 F (37 C) (10/03 1347) Pulse Rate:  [72-92] 92 (10/03 1347) Cardiac Rhythm: Normal sinus rhythm (10/03 0815) Resp:  [18-20] 20 (10/03 1347) BP: (102-141)/(48-66) 122/59 (10/03 1347) SpO2:  [96 %-100 %] 96 % (10/03 1347)  CBC:   Recent Labs Lab 10/24/16 0831 10/25/16 0447 10/26/16 0526  WBC 6.7 6.3 5.6  NEUTROABS 3.1  --   --   HGB 12.2 11.9* 11.3*  HCT 38.1 37.4 36.6  MCV 86.4 86.8 86.9  PLT  202 194 191    Basic Metabolic Panel:   Recent Labs Lab 10/25/16 0447 10/26/16 0526  NA 138 137  K 3.8 4.4  CL 108 104  CO2 20* 23  GLUCOSE 131* 159*  BUN 13 12  CREATININE 0.63 0.60  CALCIUM 9.2 9.7    Lipid Panel:     Component Value Date/Time   CHOL 146 10/25/2016 0447   TRIG 161 (H) 10/25/2016 0447   HDL 33 (L) 10/25/2016 0447   CHOLHDL 4.4 10/25/2016 0447   VLDL 32 10/25/2016 0447   LDLCALC 81 10/25/2016 0447   HgbA1c:  Lab Results  Component Value Date   HGBA1C 7.0 (H) 10/24/2016   Urine Drug Screen:     Component Value Date/Time   LABOPIA NONE DETECTED 10/24/2016 0825   COCAINSCRNUR NONE DETECTED 10/24/2016 0825   LABBENZ NONE DETECTED 10/24/2016 0825   AMPHETMU NONE DETECTED 10/24/2016 0825   THCU NONE DETECTED 10/24/2016 0825   LABBARB NONE DETECTED 10/24/2016 0825    Alcohol Level     Component Value Date/Time   ETH <10 10/24/2016 0831    IMAGING  Ct Angio Head W Or Wo Contrast 10/24/2016 IMPRESSION: CTA NECK: 1. Atherosclerosis without hemodynamically significant stenosis or acute vascular process. CTA HEAD: 1. Short segment LEFT M1 severe stenosis ; no emergent large vessel occlusion. 2. Severe stenosis LEFT ICA supraclinoid segment and RIGHT proximal M2 segment. Moderate stenoses bilateral anterior cerebral artery's. 3. Enhancing subacute LEFT parietal/MCA  territory infarct, less likely interval hemorrhage. CT PERFUSION: 1. LEFT temporoparietal penumbra, profusion shows no discrete infarct though by prior imaging, this is not accurate. Aortic Atherosclerosis (ICD10-I70.0).  Dg Chest 2 View 10/24/2016 IMPRESSION: No evidence of active disease.  Ct Head Wo Contrast 10/24/2016 IMPRESSION: 1. 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 Likely small amount of petechial hemorrhage. 2. Mild chronic small vessel ischemic disease.  Ct Angio Neck W Or Wo Contrast 10/24/2016 IMPRESSION: CTA NECK: 1. Atherosclerosis without hemodynamically  significant stenosis or acute vascular process. CTA HEAD: 1. Short segment LEFT M1 severe stenosis ; no emergent large vessel occlusion. 2. Severe stenosis LEFT ICA supraclinoid segment and RIGHT proximal M2 segment. Moderate stenoses bilateral anterior cerebral artery's. 3. Enhancing subacute LEFT parietal/MCA territory infarct, less likely interval hemorrhage. CT PERFUSION: 1. LEFT temporoparietal penumbra, profusion shows no discrete infarct though by prior imaging, this is not accurate. Aortic Atherosclerosis (ICD10-I70.0).  Mr [name] Neck W Wo Contrast 10/24/2016 IMPRESSION: 1. Scattered areas of acute ischemia within the left MCA distribution, greatest in the posterior left parietal and temporal lobes. No associated mass effect. 2. Area of hyperintense T1 weighted signal along the surface of the left posterior parietotemporal infarct likely indicates petechial hemorrhage. No space-occupying parenchymal hematoma. 3. Diminished flow related enhancement within the proximal left MCA M1 segment with normal flow related enhancement distally likely indicates subtotal occlusion. 4. Approximately 50% stenosis of the proximal left internal carotid artery.  Mr Brain Wo Contrast 10/24/2016 IMPRESSION: 1. Scattered areas of acute ischemia within the left MCA distribution, greatest in the posterior left parietal and temporal lobes. No associated mass effect. 2. Area of hyperintense T1 weighted signal along the surface of the left posterior parietotemporal infarct likely indicates petechial hemorrhage. No space-occupying parenchymal hematoma. 3. Diminished flow related enhancement within the proximal left MCA M1 segment with normal flow related enhancement distally likely indicates subtotal occlusion. 4. Approximately 50% stenosis of the proximal left internal carotid artery.  Ct Cerebral Perfusion W Contrast 10/24/2016 IMPRESSION: CTA NECK: 1. Atherosclerosis without hemodynamically significant stenosis or acute  vascular process. CTA HEAD: 1. Short segment LEFT M1 severe stenosis ; no emergent large vessel occlusion. 2. Severe stenosis LEFT ICA supraclinoid segment and RIGHT proximal M2 segment. Moderate stenoses bilateral anterior cerebral artery's. 3. Enhancing subacute LEFT parietal/MCA territory infarct, less likely interval hemorrhage. CT PERFUSION: 1. LEFT temporoparietal penumbra, profusion shows no discrete infarct though by prior imaging, this is not accurate. Aortic Atherosclerosis (ICD10-I70.0).  Mr [name] Head Wo Contrast 10/24/2016 IMPRESSION: 1. Scattered areas of acute ischemia within the left MCA distribution, greatest in the posterior left parietal and temporal lobes. No associated mass effect. 2. Area of hyperintense T1 weighted signal along the surface of the left posterior parietotemporal infarct likely indicates petechial hemorrhage. No space-occupying parenchymal hematoma. 3. Diminished flow related enhancement within the proximal left MCA M1 segment with normal flow related enhancement distally likely indicates subtotal occlusion. 4. Approximately 50% stenosis of the proximal left internal carotid artery.    PHYSICAL EXAM Constitutional- alert, co-operative, NAD Cardiac- RRR, no murmurs, rubs or gallops. Respiratory- CTAB, no wheezes or crackles. Neurologic- CN: Eye movement intact throughout, partial right temporal visual field deficit, tongue midline, face symmetric Language: Expressive aphasia with word searching difficulty, comprehension intact Motor: 4/5 right hand strength, 5/5 lower extremities, right hand movements slightly slowed, gait not tested, no dysarthria Sensory: Sensation grossly intact throughout   ASSESSMENT/PLAN Ms. Deshundra Waller is a 76 y.o. female with history of diabetes mellitus, hypertension, former smoking  presenting with right hand weakness and aphasia. She did not receive IV t-PA due to outside window.    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 Yes; Fluid consistency: Thin Diet - low sodium heart healthy Diet Carb Modified  No antithrombotic prior to admission, now on aspirin 81 mg daily and clopidogrel 75 mg daily  Patient counseled to be compliant with her antithrombotic medications  Ongoing aggressive stroke risk factor management  Therapy recommendations:  HHPT/Outpatient OT/Outpatient SLP/24hr supervision  Disposition:  Home  Hypertension  Stable  Permissive hypertension (OK if < 220/120) but gradually normalize in 5-7 days  Long-term BP goal normotensive  Hyperlipidemia  Home meds: atorvastatin , increased to  here  LDL 81, goal < 70  Continue statin at discharge  Diabetes  HgbA1c 7.0, goal < 7.0  Borderline Controlled  Other Stroke Risk Factors  Advanced age  Obesity, Body mass index is 29.94 kg/m., recommend weight loss, diet and exercise as appropriate   Migraines  Other Active Problems  She will be treated with DAPT x3 months then plavix alone given history likely multiple TIAs preceding intrinsic stroke 2/2 vessel disease. Loading dose plavix  today then .  Hospital day # 2 I have personally examined this patient, reviewed notes, independently viewed imaging studies, participated in medical decision making and plan of care.ROS completed by me personally and pertinent positives fully documented  I have made any additions or clarifications directly to the above note. . She presented with stuttering expressive  aphasia and left MCA branch infarct secondary to likely intrinsic left MCA stenosis with near occlusion. Recommend dual antiplatelet therapy of aspirin and Plavix for 3 months followed by Plavix alone. Aggressive risk factor modification. Long discussion of the bedside with patient and family and answered questions. Discussed with Dr. Irene Limbo. Patient is considering  participation in the stroke atrial fibrillation trial . Greater than 50% of time during this 25 minute visit was spent on counseling and coordination of care about her intracranial stenosis, TIAs, stroke discussion about treatment and prevention and answering questions. Follow-up as an outpatient in stroke clinic in 6 weeks. Stroke team will sign off. Kindly call for questions of any. Delia Heady, MD Medical Director [facility] Pager: [phone] 10/26/2016 3:14 PM    To contact Stroke Continuity provider, please refer to [email or web address]. After hours, contact General Neurology

## 2016-10-26 NOTE — Progress Notes (Signed)
Occupational Therapy Treatment Patient Details Name: Jill Howard MRN: [id number] DOB: 25-Nov-1940 Today's Date: 10/26/2016    History of present illness Pt is a 76 y/o female who presents with R sided weakness, decreased fine motor/grip strength/dysarthria of R hand. 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 MRI pending.  PMH significant for vertigo, fibromyalgia, DMII.   OT comments  Pt progressing towards established OT goals and continues to present with decreased strength and coordination of RUE. Provided pt with education and handout on FM activities and theraputty exercises to increase strength and functional use of R hand. Continues to recommend follow up at neuro outpatient wit optimize safety and independence with ADLs. Will continue to follow acutely to facilitate safe dc.    Follow Up Recommendations  Outpatient OT;Supervision/Assistance - 24 hour (Neuro)    Equipment Recommendations  None recommended by OT    Recommendations for Other Services PT consult    Precautions / Restrictions Precautions Precautions: Fall Restrictions Weight Bearing Restrictions: No       Mobility Bed Mobility               General bed mobility comments: Pt sitting EOB upon arrival.  Transfers                      Balance Overall balance assessment: Needs assistance Sitting-balance support: No upper extremity supported;Feet supported Sitting balance-Leahy Scale: Fair                                     ADL either performed or assessed with clinical judgement   ADL                                         General ADL Comments: Focused session on FM excercises to increase fucntional use of dominant hand     Vision       Perception     Praxis      Cognition Arousal/Alertness: Awake/alert Behavior During Therapy: WFL for tasks assessed/performed Overall Cognitive  Status: History of cognitive impairments - at baseline Area of Impairment: Attention;Memory;Problem solving                   Current Attention Level: Selective Memory: Decreased short-term memory       Problem Solving: Slow processing;Requires verbal cues;Requires tactile cues;Difficulty sequencing General Comments: Pt continues to require increased verbal and visual cues to follow commands.         Exercises Exercises: Other exercises Other Exercises Other Exercises: Provided pt with super soft (tan) theraputty with HEP. Reviewed HEP with family present to increase carry over to home. Other Exercises: Provided pt with handout on FM activities. Reviewed handout.   Shoulder Instructions       General Comments Pt daughter and husband present for session    Pertinent Vitals/ Pain       Pain Assessment: No/denies pain  Home Living                                          Prior Functioning/Environment              Frequency  Min  2X/week        Progress Toward Goals  OT Goals(current goals can now be found in the care plan section)  Progress towards OT goals: Progressing toward goals  Acute Rehab OT Goals Patient Stated Goal: Go home OT Goal Formulation: With patient/family Time For Goal Achievement: 11/08/16 Potential to Achieve Goals: Good ADL Goals Pt Will Perform Grooming: with modified independence;standing Pt/caregiver will Perform Home Exercise Program: Right Upper extremity;With Supervision;With written HEP provided;With theraputty Additional ADL Goal #1: Pt will perform three step ADLs with Min VCs  Plan Discharge plan remains appropriate    Co-evaluation                 AM-PAC PT "6 Clicks" Daily Activity     Outcome Measure   Help from another person eating meals?: A Little Help from another person taking care of personal grooming?: A Little Help from another person toileting, which includes using toliet,  bedpan, or urinal?: A Little Help from another person bathing (including washing, rinsing, drying)?: A Little Help from another person to put on and taking off regular upper body clothing?: A Little Help from another person to put on and taking off regular lower body clothing?: A Little 6 Click Score: 18    End of Session    OT Visit Diagnosis: Unsteadiness on feet (R26.81);Other abnormalities of gait and mobility (R26.89);Muscle weakness (generalized) (M62.81);Hemiplegia and hemiparesis;Other symptoms and signs involving cognitive function Hemiplegia - Right/Left: Right Hemiplegia - dominant/non-dominant: Dominant Hemiplegia - caused by: Cerebral infarction   Activity Tolerance Patient tolerated treatment well   Patient Left with call bell/phone within reach;with family/visitor present (with MD at [facility])   Nurse Communication Mobility status        Time: 4098-1191 OT Time Calculation (min): 8 min  Charges: OT General Charges $OT Visit: 1 Visit OT Treatments $Therapeutic Activity: 8-22 mins  Lexys Milliner MSOT, OTR/L Acute Rehab Pager: [phone] Office: [phone]   Theodoro [place] Paster 10/26/2016, 12:58 PM

## 2016-10-26 NOTE — Progress Notes (Signed)
PT Cancellation Note  Patient Details Name: Jill Howard MRN: [id number] DOB: 04-05-1940   Cancelled Treatment:    Reason Eval/Treat Not Completed: Patient declined. She is scheduled for d/c today, and pt reports she does not feel she needs any further acute physical therapy services. Both pt and family confirming that HHPT will be following up for continued strengthening, and to improve overall tolerance for functional activity. Will sign off at this time per pt request. If needs change, please reconsult.    Marylynn Pearson 10/26/2016, 11:36 AM   Conni Slipper, PT, DPT Acute Rehabilitation Services Pager: [phone]

## 2016-10-26 NOTE — Plan of Care (Signed)
Problem: Safety: Goal: Ability to remain free from injury will improve Outcome: Progressing Pt is independent and steady with ambulation

## 2016-10-28 ENCOUNTER — Emergency Department (HOSPITAL_COMMUNITY)
Admission: EM | Admit: 2016-10-28 | Discharge: 2016-10-28 | Disposition: A | Discharge status: Home / Self Care | Payer: Medicare Other | Attending: Emergency Medicine | Admitting: Emergency Medicine

## 2016-10-28 ENCOUNTER — Encounter (HOSPITAL_COMMUNITY): Payer: Self-pay | Admitting: Emergency Medicine

## 2016-10-28 ENCOUNTER — Emergency Department (HOSPITAL_COMMUNITY): Payer: Medicare Other

## 2016-10-28 DIAGNOSIS — Z7982 Long term (current) use of aspirin: Secondary | ICD-10-CM | POA: Diagnosis not present

## 2016-10-28 DIAGNOSIS — Z79899 Other long term (current) drug therapy: Secondary | ICD-10-CM | POA: Diagnosis not present

## 2016-10-28 DIAGNOSIS — Z8673 Personal history of transient ischemic attack (TIA), and cerebral infarction without residual deficits: Secondary | ICD-10-CM | POA: Insufficient documentation

## 2016-10-28 DIAGNOSIS — I10 Essential (primary) hypertension: Secondary | ICD-10-CM | POA: Diagnosis not present

## 2016-10-28 DIAGNOSIS — E119 Type 2 diabetes mellitus without complications: Secondary | ICD-10-CM | POA: Insufficient documentation

## 2016-10-28 DIAGNOSIS — Z7984 Long term (current) use of oral hypoglycemic drugs: Secondary | ICD-10-CM | POA: Insufficient documentation

## 2016-10-28 DIAGNOSIS — R202 Paresthesia of skin: Secondary | ICD-10-CM | POA: Insufficient documentation

## 2016-10-28 DIAGNOSIS — Z7901 Long term (current) use of anticoagulants: Secondary | ICD-10-CM | POA: Insufficient documentation

## 2016-10-28 DIAGNOSIS — R200 Anesthesia of skin: Secondary | ICD-10-CM | POA: Diagnosis present

## 2016-10-28 LAB — COMPREHENSIVE METABOLIC PANEL
ALBUMIN: 3.4 g/dL — AB (ref 3.5–5.0)
ALK PHOS: 74 U/L (ref 38–126)
ALT: 23 U/L (ref 14–54)
ANION GAP: 9 (ref 5–15)
AST: 24 U/L (ref 15–41)
BUN: 23 mg/dL — AB (ref 6–20)
CALCIUM: 9.6 mg/dL (ref 8.9–10.3)
CO2: 20 mmol/L — AB (ref 22–32)
Chloride: 104 mmol/L (ref 101–111)
Creatinine, Ser: 0.82 mg/dL (ref 0.44–1.00)
GFR calc Af Amer: >60 mL/min (ref >60)
GFR calc non Af Amer: >60 mL/min (ref >60)
GLUCOSE: 115 mg/dL — AB (ref 65–99)
Potassium: 4.1 mmol/L (ref 3.5–5.1)
SODIUM: 133 mmol/L — AB (ref 135–145)
Total Bilirubin: 0.5 mg/dL (ref 0.3–1.2)
Total Protein: 7.3 g/dL (ref 6.5–8.1)

## 2016-10-28 LAB — RAPID URINE DRUG SCREEN, HOSP PERFORMED
AMPHETAMINES: NOT DETECTED
Barbiturates: NOT DETECTED
Benzodiazepines: NOT DETECTED
COCAINE: NOT DETECTED
OPIATES: NOT DETECTED
TETRAHYDROCANNABINOL: NOT DETECTED

## 2016-10-28 LAB — URINALYSIS, ROUTINE W REFLEX MICROSCOPIC
Bilirubin Urine: NEGATIVE
Glucose, UA: >=500 mg/dL — AB
KETONES UR: NEGATIVE mg/dL
Nitrite: NEGATIVE
PROTEIN: NEGATIVE mg/dL
Specific Gravity, Urine: 1.007 (ref 1.005–1.030)
pH: 5 (ref 5.0–8.0)

## 2016-10-28 LAB — ETHANOL: Alcohol, Ethyl (B): <10 mg/dL (ref <10)

## 2016-10-28 LAB — I-STAT CHEM 8, ED
BUN: 26 mg/dL — ABNORMAL HIGH (ref 6–20)
CALCIUM ION: 1.14 mmol/L — AB (ref 1.15–1.40)
CHLORIDE: 105 mmol/L (ref 101–111)
CREATININE: 0.7 mg/dL (ref 0.44–1.00)
GLUCOSE: 116 mg/dL — AB (ref 65–99)
HCT: 39 % (ref 36.0–46.0)
HEMOGLOBIN: 13.3 g/dL (ref 12.0–15.0)
POTASSIUM: 4.2 mmol/L (ref 3.5–5.1)
Sodium: 138 mmol/L (ref 135–145)
TCO2: 21 mmol/L — ABNORMAL LOW (ref 22–32)

## 2016-10-28 LAB — APTT: APTT: 26 s (ref 24–36)

## 2016-10-28 LAB — I-STAT TROPONIN, ED: Troponin i, poc: 0.01 ng/mL (ref 0.00–0.08)

## 2016-10-28 LAB — CBC
HCT: 39.9 % (ref 36.0–46.0)
HEMOGLOBIN: 12.7 g/dL (ref 12.0–15.0)
MCH: 27.5 pg (ref 26.0–34.0)
MCHC: 31.8 g/dL (ref 30.0–36.0)
MCV: 86.6 fL (ref 78.0–100.0)
PLATELETS: 200 10*3/uL (ref 150–400)
RBC: 4.61 MIL/uL (ref 3.87–5.11)
RDW: 15.3 % (ref 11.5–15.5)
WBC: 7.6 10*3/uL (ref 4.0–10.5)

## 2016-10-28 LAB — DIFFERENTIAL
BASOS ABS: 0 10*3/uL (ref 0.0–0.1)
Basophils Relative: 1 %
EOS PCT: 3 %
Eosinophils Absolute: 0.2 10*3/uL (ref 0.0–0.7)
LYMPHS PCT: 51 %
Lymphs Abs: 3.8 10*3/uL (ref 0.7–4.0)
Monocytes Absolute: 0.5 10*3/uL (ref 0.1–1.0)
Monocytes Relative: 7 %
NEUTROS PCT: 40 %
Neutro Abs: 3 10*3/uL (ref 1.7–7.7)

## 2016-10-28 LAB — PROTIME-INR
INR: 1.02
PROTHROMBIN TIME: 13.3 s (ref 11.4–15.2)

## 2016-10-28 MED ORDER — LORAZEPAM 1 MG PO TABS
1.0000 mg | ORAL_TABLET | Freq: Once | ORAL | Status: AC
  Administered 2016-10-28: 1 mg via ORAL
  Filled 2016-10-28: qty 1

## 2016-10-28 NOTE — ED Notes (Signed)
Patient transported to CT 

## 2016-10-28 NOTE — ED Provider Notes (Signed)
[facility]-EMERGENCY DEPT Provider Note   CSN: [id number] Arrival date & time: 10/28/16  7829     History   Chief Complaint Chief Complaint  Patient presents with  . Numbness    HPI Puja Caffey is a 76 y.o. female.  HPI Patient presents with left-sided numbness and weakness. First noticed this morning around 5:30. States there was a strange feeling on her face and left side. States it is like she had dental work and is numb. Reportedly was a little weak on that side earlier for MS but is resolved. No headache, but the left side of her face does hurt. Has recent admission to hospital and was discharged 2 days ago. Had right sided deficits from stroke, Including some speech difficulty. Had had no left-sided deficits. Had some narrowing on the left side of her cranial vasculature but had no right-sided lesions. Had been doing pretty well overall head therapy yesterday. Reportedly the deficits worse as the day goes on and are usually pretty good in the morning. No chest pain. No trouble breathing. Patient went to bed at 11:00 last night. Past Medical History:  Diagnosis Date  . Diabetes mellitus without complication (HCC)   . Hypertension   . Kidney stone   . Vertigo     Patient Active Problem List   Diagnosis Date Noted  . CVA (cerebral vascular accident) (HCC) 10/25/2016  . 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 Essential hypertension 10/24/2016  . Diabetes mellitus with complication (HCC) 10/24/2016  . Chronic pain 10/24/2016    Past Surgical History:  Procedure Laterality Date  . ABDOMINAL HYSTERECTOMY    . KIDNEY STONE SURGERY      OB History    No data available       Home Medications    Prior to Admission medications   Medication Sig Start Date End Date Taking? Authorizing Provider  amLODipine (NORVASC) 5 MG tablet Take 5 mg by mouth daily.    [provider]  aspirin 81 MG tablet Take 81 mg by mouth daily.    [provider]  atorvastatin  (LIPITOR) 80 MG tablet Take 1 tablet (80 mg total) by mouth at bedtime. 10/25/16   Rai, Ripudeep Kirtland Bouchard, MD  Cholecalciferol (VITAMIN D3) 5000 UNITS CAPS Take by mouth.    [provider]  clopidogrel (PLAVIX) 75 MG tablet Take 1 tablet (75 mg total) by mouth daily. 10/26/16   Rai, Delene Ruffini, MD  Colchicine 0.6 MG CAPS Take 0.6 mg by mouth daily. 08/18/16   [provider]  Dulaglutide (TRULICITY [place]) Inject 1.5 mg into the skin once a week.     [provider]  glyBURIDE (DIABETA) 5 MG tablet Take 5 mg by mouth 2 (two) times daily with a meal.    [provider]  INVOKANA 300 MG TABS tablet Take 300 mg by mouth daily. 07/23/16   [provider]  lisinopril (PRINIVIL,ZESTRIL) 20 MG tablet Take 20 mg by mouth daily. 09/09/16   [provider]  meclizine (ANTIVERT) 12.5 MG tablet Take 12.5 mg by mouth 3 (three) times daily as needed for dizziness. 10/21/16   [provider]  metFORMIN (GLUCOPHAGE) 1000 MG tablet Take 1,000 mg by mouth 2 (two) times daily with a meal.    [provider]    Family History Family History  Problem Relation Age of Onset  . Stroke Other   . Heart attack Other   . Cancer Other   . Diabetes Other  Social History Social History  Substance Use Topics  . Smoking status: Never Smoker  . Smokeless tobacco: Never Used  . Alcohol use No     Allergies   Influenza vaccines; Sitagliptin; Guaifenesin; Latex; Other; Phenylephrine-pheniramine-dm; Ultram [tramadol hcl]; and Escitalopram oxalate   Review of Systems Review of Systems  Constitutional: Negative for appetite change.  HENT: Negative for congestion.   Respiratory: Negative for chest tightness.   Cardiovascular: Negative for chest pain.  Gastrointestinal: Negative for abdominal distention.  Genitourinary: Negative for flank pain.  Musculoskeletal: Negative for back pain.  Neurological: Positive for speech difficulty, weakness and numbness.    Hematological: Negative for adenopathy.  Psychiatric/Behavioral: Negative for agitation and confusion.     Physical Exam Updated Vital Signs BP (!) 105/46   Pulse 81   Temp 98.5 F (36.9 C) (Oral)   Resp 20   Ht  (1.6 m)   Wt 76.7 kg (169 lb)   SpO2 98%   BMI 29.94 kg/m   Physical Exam  Constitutional: She is oriented to person, place, and time. She appears well-developed.  HENT:  Head: Atraumatic.  Eyes: EOM are normal.  Neck: Neck supple.  Cardiovascular: Normal rate.   Pulmonary/Chest: Effort normal.  Abdominal: There is no tenderness.  Neurological: She is alert and oriented to person, place, and time.  Some mild expressive aphasia. It is more little bit of difficulty finding the words. Good grip strength bilaterally. Good straight leg raise bilaterally. Eye movements intact. Has paresthesias to left upper and lower face left upper extremity and left lower extremity.  Skin: Skin is warm.  Psychiatric: She has a normal mood and affect.     ED Treatments / Results  Labs (all labs ordered are listed, but only abnormal results are displayed) Labs Reviewed  COMPREHENSIVE METABOLIC PANEL - Abnormal; Notable for the following:       Result Value   Sodium 133 (*)    CO2 20 (*)    Glucose, Bld 115 (*)    BUN 23 (*)    Albumin 3.4 (*)    All other components within normal limits  URINALYSIS, ROUTINE W REFLEX MICROSCOPIC - Abnormal; Notable for the following:    APPearance CLOUDY (*)    Glucose, UA >=500 (*)    Hgb urine dipstick SMALL (*)    Leukocytes, UA LARGE (*)    Bacteria, UA MANY (*)    Squamous Epithelial / LPF 0-5 (*)    All other components within normal limits  I-STAT CHEM 8, ED - Abnormal; Notable for the following:    BUN 26 (*)    Glucose, Bld 116 (*)    Calcium, Ion 1.14 (*)    TCO2 21 (*)    All other components within normal limits  ETHANOL  PROTIME-INR  APTT  CBC  DIFFERENTIAL  RAPID URINE DRUG SCREEN, HOSP PERFORMED  I-STAT  TROPONIN, ED    EKG  EKG Interpretation  Date/Time:  Friday October 28 2016 06:30:34 EDT Ventricular Rate:  90 PR Interval:    QRS Duration: 103 QT Interval:  397 QTC Calculation: 486 R Axis:   43 Text Interpretation:  Sinus rhythm Borderline prolonged QT interval Confirmed by Benjiman Core [phone]) on 10/28/2016 7:14:57 AM Also confirmed by Benjiman Core [phone]), editor Misty Stanley [phone])  on 10/28/2016 7:30:25 AM       Radiology Ct Head Wo Contrast  Result Date: 10/28/2016 CLINICAL DATA:  Woke up with numbness and burning LEFT face ready to LEFT arm.  Recent transient ischemic attack with persistent slurred speech. History of hypertension, vertigo, diabetes. EXAM: CT HEAD WITHOUT CONTRAST TECHNIQUE: Contiguous axial images were obtained from the base of the skull through the vertex without intravenous contrast. COMPARISON:  CT HEAD October 24, 2016 an MRI head October 24, 2016 FINDINGS: BRAIN: No intraparenchymal hemorrhage, mass effect nor midline shift. Evolving LEFT frontotemporal parietal patchy subacute infarcts with faint petechial hemorrhage, no hemorrhagic conversion. Patchy supratentorial white matter hypodensities are less than expected for age compatible with mild chronic small vessel ischemic disease. Ventricles and sulci are normal for patient's age. No abnormal extra-axial fluid collections. Basal cisterns are patent. VASCULAR: Moderate calcific atherosclerosis of the carotid siphons. SKULL: No skull fracture. No significant scalp soft tissue swelling. SINUSES/ORBITS: Small LEFT mastoid effusion. Trace paranasal sinus mucosal thickening.The included ocular globes and orbital contents are non-suspicious. Status post bilateral ocular lens implants. OTHER: None. IMPRESSION: 1. Evolving multifocal LEFT MCA territory infarcts with petechial hemorrhage, no hemorrhagic conversion. 2. Mild chronic small vessel ischemic disease. Electronically Signed   By: Awilda Metro  M.D.   On: 10/28/2016 06:57   Mr Brain Wo Contrast  Result Date: 10/28/2016 CLINICAL DATA:  Woke up with burning and numbness in left face. Recent TIA. EXAM: MRI HEAD WITHOUT CONTRAST TECHNIQUE: Multiplanar, multiecho pulse sequences of the brain and surrounding structures were obtained without intravenous contrast. COMPARISON:  Brain MRI and 10/24/2016 FINDINGS: Brain: Restricted diffusion in the left MCA/posterior watershed territory is unchanged are. There is a moderate area of confluent restricted diffusion in the temporoparietal lobe, extending from cortex to ventricle. Small areas of patchy acute infarcts seen left temporal and posterior frontal lobes. No progressive infarct or hemorrhage. No findings along the trigeminal nerves. No hydrocephalus, mass lesion, or shift. Vascular: Major flow voids are preserved. Skull and upper cervical spine: Negative for marrow lesion Sinuses/Orbits: Bilateral cataract resection. IMPRESSION: 1. Acute left MCA territory infarcts without progression since 10/24/2016, most confluent in the temporoparietal region. Mild petechial hemorrhage that is also nonprogressive. 2. No explanation for new symptoms. Electronically Signed   By: Marnee Spring M.D.   On: 10/28/2016 10:26    Procedures Procedures (including critical care time)  Medications Ordered in ED Medications  LORazepam (ATIVAN) tablet 1 mg (1 mg Oral Given 10/28/16 0919)     Initial Impression / Assessment and Plan / ED Course  I have reviewed the triage vital signs and the nursing notes.  Pertinent labs & imaging results that were available during my care of the patient were reviewed by me and considered in my medical decision making (see chart for details).     Patient with left-sided paresthesias. Recent workup for right-sided symptoms from a stroke. Has had no previous left-sided symptoms. MRI done and reassuring. Discussed with neurology. Will discharge home.  Final Clinical Impressions(s) /  ED Diagnoses   Final diagnoses:  Paresthesia    New Prescriptions New Prescriptions   No medications on file     Benjiman Core, MD 10/28/16 1545

## 2016-10-28 NOTE — Discharge Instructions (Signed)
Follow-up with neurology as planned. Continue to take the Asprin and Plavix

## 2016-10-28 NOTE — ED Notes (Signed)
Pt dressed and wheelchair at doorway.

## 2016-10-28 NOTE — ED Notes (Signed)
ED Provider at bedside. 

## 2016-10-28 NOTE — ED Triage Notes (Signed)
Pt BIB EMS from home. Pt went to bed approx 2300 last night. Woke up around 0530 this morning with burning & numbness to L side of face. Sensation has now travelled to L arm. Pt very recently d/c'd after suffering TIA, has speech deficit from that event.

## 2016-10-28 NOTE — ED Notes (Addendum)
Patient returned from MRI.

## 2016-11-30 ENCOUNTER — Encounter: Payer: Self-pay | Admitting: *Deleted

## 2016-11-30 ENCOUNTER — Ambulatory Visit (INDEPENDENT_AMBULATORY_CARE_PROVIDER_SITE_OTHER): Payer: Medicare Other | Admitting: Neurology

## 2016-11-30 ENCOUNTER — Encounter: Payer: Self-pay | Admitting: Neurology

## 2016-11-30 VITALS — BP 101/63 | HR 88 | Ht 63.0 in | Wt 169.2 lb

## 2016-11-30 DIAGNOSIS — I63312 Cerebral infarction due to thrombosis of left middle cerebral artery: Secondary | ICD-10-CM

## 2016-11-30 DIAGNOSIS — Z006 Encounter for examination for normal comparison and control in clinical research program: Secondary | ICD-10-CM

## 2016-11-30 NOTE — Progress Notes (Signed)
STROKE-AF Research study month 1 follow up visit completed. Patient signed new update ICF (IRB dated 30/oct/2018) copy was given to patient as well as a visit window schedule. All remaining follow up visits can be completed via phone. Patient has not had any AE's. She was seen in the ED for left sided numbness with negative diagnostic testing.Next research required visit is due between 20/MAR/19-19/APR/19.

## 2016-11-30 NOTE — Progress Notes (Signed)
[facility] [address] [place]. [place] [id number] [phone]       OFFICE FOLLOW-UP NOTE  Jill. Jill NuttingCarol Howard Date of Birth:  [birth year] Medical Record Number:  [id number]   HPI: Jill Jill Standingewman is a [age] year is not lady seen today for the first office follow-up visit following hospital admission for stroke in October 2018.  She is accompanied by her daughter.  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 She found it hard to use utensils. She had been having a hard time walking at times with leg weakness. These symptoms were off and on and completely resolved in between, hence she did not seek medical attention. She also has had frontal headache ongoing for the past 2 weeks, which is different than her usual migraines.On 10/24/16 morning around 3 AM, she had extreme difficulty walking, could not support her weight and had word finding difficulty, slurred speech and is what prompted the family to bring her to the emergency [place] for [place].She was evaluated clinically and [facility]/MRA brain. MRI of the brain showed strokes in the posterior division of the left MCA territory. The MRA of the brain also showed stenosis versus subocclusive thrombus in the left M1.LKW: at least days ago if not 2 weeks ago. tpa given?: no, outside the window.  MRI scan of the brain showed acute infarct in the posterior division of the left middle cerebral artery with MRA showing high-grade stenosis of the proximal left middle cerebral artery transthoracic echo showed normal ejection fraction.  LDL  cholesterol was 81 mg percent.  Hemoglobin A1c was 7.  Patient had expressive aphasia and right hand weakness.  Patient participated in the stroke atrial fibrillation study.  Patient states her speech has improved but only when she is tired she has some expressive difficulties.  Right hand strength is also improving but she has mild diminished fine motor skills and clumsiness.  She plans to start outpatient physical and occupational therapy tomorrow.  She continues to have right-sided peripheral vision loss.  She is a presently states that her fibromyalgia pain seems to be a lot better since his stroke.  Her blood pressure is well controlled and today it is 101/6 3.  She is tolerating Lipitor well without muscle aches and pains.  She is on aspirin and Plavix and tolerating it well but getting bruising easily though no major bleeding.  Her blood sugar is also doing better and fasting sugars range from 120-130 range.   ROS:   14 system review of systems is positive for blurred vision, frequent waking, speech difficulty and all other systems negative  PMH:  Past Medical History:  Diagnosis Date  . Diabetes mellitus without complication (HCC)   . Hypertension   . Kidney stone   . Stroke (HCC)   . Vertigo     Social History:  Social History   Socioeconomic History  . Marital status: Married    Spouse name: Not on file  . Number of children: Not on file  . Years of education: Not on file  . Highest education level: Not on file  Social Needs  . Financial  resource strain: Not on file  . Food insecurity - worry: Not on file  . Food insecurity - inability: Not on file  . Transportation needs - medical: Not on file  . Transportation needs - non-medical: Not on file  Occupational History  . Not on file  Tobacco Use  . Smoking status: Never Smoker  . Smokeless tobacco: Never Used  Substance and Sexual Activity  . Alcohol use: No  . Drug use: No  . Sexual activity: Not on file  Other  Topics Concern  . Not on file  Social History Narrative  . Not on file    Medications:   Current Outpatient Medications on File Prior to Visit  Medication Sig Dispense Refill  . amLODipine (NORVASC) 5 MG tablet Take 5 mg by mouth daily.    Marland Kitchen. aspirin 81 MG tablet Take 81 mg by mouth daily.    Marland Kitchen. atorvastatin (LIPITOR) 80 MG tablet Take 1 tablet (80 mg total) by mouth at bedtime. 30 tablet 3  . canagliflozin (INVOKANA) 300 MG TABS tablet Take 300 mg by mouth.    . Cholecalciferol (VITAMIN D3) 5000 UNITS CAPS Take 1,000 Units by mouth.     . clopidogrel (PLAVIX) 75 MG tablet Take 1 tablet (75 mg total) by mouth daily. 30 tablet 4  . furosemide (LASIX) 20 MG tablet Take 20 mg as needed by mouth.     . glyBURIDE (DIABETA) 5 MG tablet Take 5 mg by mouth 2 (two) times daily with a meal.    . Insulin Glargine (TOUJEO SOLOSTAR ) Inject 40 Units into the skin.    Marland Kitchen. lisinopril (PRINIVIL,ZESTRIL) 20 MG tablet Take 20 mg by mouth daily.    . meclizine (ANTIVERT) 12.5 MG tablet Take 12.5 mg by mouth 3 (three) times daily as needed for dizziness.    . metFORMIN (GLUCOPHAGE) 1000 MG tablet Take 1,000 mg by mouth 2 (two) times daily with a meal.    . vitamin B-12 (CYANOCOBALAMIN) 1000 MCG tablet Take by mouth.     No current facility-administered medications on file prior to visit.     Allergies:   Allergies  Allergen Reactions  . Influenza Vaccines Swelling    Arm was swollen in 2002   . Sitagliptin Swelling  . Guaifenesin Other (See Comments)    Unknown  . Latex Other (See Comments)    Unknown  . Other Other (See Comments)    Band-Aid  . Phenylephrine-Pheniramine-Dm Other (See Comments)    Unknown  . Ultram [Tramadol Hcl] Other (See Comments)    hallucinations  . Escitalopram Oxalate Other (See Comments)    Hallucinations    Physical Exam General: well developed, well nourished, seated, in no evident distress Head: head normocephalic and atraumatic.  Neck: supple with no carotid or  supraclavicular bruits Cardiovascular: regular rate and rhythm, no murmurs Musculoskeletal: no deformity Skin:  no rash/petichiae Vascular:  Normal pulses all extremities Vitals:   11/30/16 1332  BP: 101/63  Pulse: 88   Neurologic Exam Mental Status: Awake and fully alert. Oriented to place and time. Recent and remote memory intact. Attention span, concentration and fund of knowledge appropriate. Mood and affect appropriate.  Cranial Nerves: Fundoscopic exam reveals sharp disc margins. Pupils equal, briskly reactive to light. Extraocular movements full without nystagmus. Visual fields showed dense right homonymous hemianopsia to confrontation. Hearing intact. Facial sensation intact. Face, tongue, palate moves normally and symmetrically.  Motor: Normal bulk and tone. Normal strength in all tested extremity muscles.  Diminished fine  finger movements on the right.  Orbits left to right upper extremity.  Mild right grip weakness. Sensory.: intact to touch ,pinprick .position and vibratory sensation.  Coordination: Rapid alternating movements normal in all extremities. Finger-to-nose and heel-to-shin performed accurately bilaterally. Gait and Station: Arises from chair without difficulty. Stance is normal. Gait demonstrates normal stride length and balance . Able to heel, toe and tandem walk with slight difficulty.  Reflexes: 1+ and symmetric. Toes downgoing.   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  She is made good functional recovery except for persistent right homonymous hemianopsia.  Vascular risk factors of hypertension, hyperlipidemia , diabetes and intracranial atherosclerosis    PLAN: I had a long d/w patient and her daughter about her recent stroke,right sided visual field loss, risk for recurrent stroke/TIAs, personally independently reviewed imaging studies and  stroke evaluation results and answered questions.Continue aspirin 81 mg daily and clopidogrel 75 mg daily till 11/24/2017 and then stop aspirin and stay on clopidogrel alone for secondary stroke prevention and maintain strict control of hypertension with blood pressure goal below 130/90, diabetes with hemoglobin A1c goal below 6.5% and lipids with LDL cholesterol goal below 70 mg/dL. I also advised the patient to eat a healthy diet with plenty of whole grains, cereals, fruits and vegetables, exercise regularly and maintain ideal body weight .I have advised the patient not to drive until her peripheral visual loss improves.  Followup in the future with my nurse practitioner in 6 months or call earlier if necessary.  Continue follow-up in the research clinic for stroke atrial fibrillation trial as scheduled Greater than 50% of time during this 25 minute visit was spent on counseling,explanation of diagnosis of stroke, planning of further management, discussion with patient and family and coordination of care Delia Heady, MD  [facility] [address] [place], [place] [address]  Phone [phone] Fax [phone] Note: This document was prepared with digital dictation and possible smart phrase technology. Any transcriptional errors that result from this process are unintentional

## 2016-11-30 NOTE — Patient Instructions (Addendum)
I had a long d/w patient and her daughter about her recent stroke,right sided visual field loss, risk for recurrent stroke/TIAs, personally independently reviewed imaging studies and stroke evaluation results and answered questions.Continue aspirin 81 mg daily and clopidogrel 75 mg daily till 11/24/2017 and then stop aspirin and stay on clopidogrel alone for secondary stroke prevention and maintain strict control of hypertension with blood pressure goal below 130/90, diabetes with hemoglobin A1c goal below 6.5% and lipids with LDL cholesterol goal below 70 mg/dL. I also advised the patient to eat a healthy diet with plenty of whole grains, cereals, fruits and vegetables, exercise regularly and maintain ideal body weight .I have advised the patient not to drive until her peripheral visual loss improves.  Followup in the future with my nurse practitioner in 6 months or call earlier if necessary. Stroke Prevention Some medical conditions and behaviors are associated with an increased chance of having a stroke. You may prevent a stroke by making healthy choices and managing medical conditions. How can I reduce my risk of having a stroke?  Stay physically active. Get at least 30 minutes of activity on most or all days.  Do not smoke. It may also be helpful to avoid exposure to secondhand smoke.  Limit alcohol use. Moderate alcohol use is considered to be: ? No more than 2 drinks per day for men. ? No more than 1 drink per day for nonpregnant women.  Eat healthy foods. This involves: ? Eating 5 or more servings of fruits and vegetables a day. ? Making dietary changes that address high blood pressure (hypertension), high cholesterol, diabetes, or obesity.  Manage your cholesterol levels. ? Making food choices that are high in fiber and low in saturated fat, trans fat, and cholesterol may control cholesterol levels. ? Take any prescribed medicines to control cholesterol as directed by your health care  provider.  Manage your diabetes. ? Controlling your carbohydrate and sugar intake is recommended to manage diabetes. ? Take any prescribed medicines to control diabetes as directed by your health care provider.  Control your hypertension. ? Making food choices that are low in salt (sodium), saturated fat, trans fat, and cholesterol is recommended to manage hypertension. ? Ask your health care provider if you need treatment to lower your blood pressure. Take any prescribed medicines to control hypertension as directed by your health care provider. ? If you are 4418-74 years of age, have your blood pressure checked every 3-5 years. If you are 640 years of age or older, have your blood pressure checked every year.  Maintain a healthy weight. ? Reducing calorie intake and making food choices that are low in sodium, saturated fat, trans fat, and cholesterol are recommended to manage weight.  Stop drug abuse.  Avoid taking birth control pills. ? Talk to your health care provider about the risks of taking birth control pills if you are over 74 years old, smoke, get migraines, or have ever had a blood clot.  Get evaluated for sleep disorders (sleep apnea). ? Talk to your health care provider about getting a sleep evaluation if you snore a lot or have excessive sleepiness.  Take medicines only as directed by your health care provider. ? For some people, aspirin or blood thinners (anticoagulants) are helpful in reducing the risk of forming abnormal blood clots that can lead to stroke. If you have the irregular heart rhythm of atrial fibrillation, you should be on a blood thinner unless there is a good reason you cannot take  them. ? Understand all your medicine instructions.  Make sure that other conditions (such as anemia or atherosclerosis) are addressed. Get help right away if:  You have sudden weakness or numbness of the face, arm, or leg, especially on one side of the body.  Your face or eyelid  droops to one side.  You have sudden confusion.  You have trouble speaking (aphasia) or understanding.  You have sudden trouble seeing in one or both eyes.  You have sudden trouble walking.  You have dizziness.  You have a loss of balance or coordination.  You have a sudden, severe headache with no known cause.  You have new chest pain or an irregular heartbeat. Any of these symptoms may represent a serious problem that is an emergency. Do not wait to see if the symptoms will go away. Get medical help at once. Call your local emergency services (911 in [place] Do not drive yourself to the hospital. This information is not intended to replace advice given to you by your health care provider. Make sure you discuss any questions you have with your health care provider. Document Released: 02/18/2004 Document Revised: 06/18/2015 Document Reviewed: 07/13/2012 Elsevier Interactive Patient Education  2017 [employer]

## 2016-12-01 ENCOUNTER — Encounter: Payer: Self-pay | Admitting: Occupational Therapy

## 2016-12-01 ENCOUNTER — Ambulatory Visit: Payer: Medicare Other | Admitting: Occupational Therapy

## 2016-12-01 ENCOUNTER — Ambulatory Visit: Payer: Medicare Other | Attending: Internal Medicine | Admitting: Physical Therapy

## 2016-12-01 VITALS — BP 123/70 | HR 78

## 2016-12-01 DIAGNOSIS — M6281 Muscle weakness (generalized): Secondary | ICD-10-CM | POA: Diagnosis present

## 2016-12-01 DIAGNOSIS — R4701 Aphasia: Secondary | ICD-10-CM | POA: Insufficient documentation

## 2016-12-01 DIAGNOSIS — R2681 Unsteadiness on feet: Secondary | ICD-10-CM | POA: Diagnosis present

## 2016-12-01 DIAGNOSIS — R262 Difficulty in walking, not elsewhere classified: Secondary | ICD-10-CM | POA: Diagnosis present

## 2016-12-01 DIAGNOSIS — R278 Other lack of coordination: Secondary | ICD-10-CM

## 2016-12-01 DIAGNOSIS — R41840 Attention and concentration deficit: Secondary | ICD-10-CM | POA: Insufficient documentation

## 2016-12-01 DIAGNOSIS — I69318 Other symptoms and signs involving cognitive functions following cerebral infarction: Secondary | ICD-10-CM | POA: Insufficient documentation

## 2016-12-01 DIAGNOSIS — R41841 Cognitive communication deficit: Secondary | ICD-10-CM | POA: Insufficient documentation

## 2016-12-01 DIAGNOSIS — R42 Dizziness and giddiness: Secondary | ICD-10-CM | POA: Diagnosis present

## 2016-12-01 DIAGNOSIS — R41842 Visuospatial deficit: Secondary | ICD-10-CM | POA: Insufficient documentation

## 2016-12-01 DIAGNOSIS — I69351 Hemiplegia and hemiparesis following cerebral infarction affecting right dominant side: Secondary | ICD-10-CM | POA: Diagnosis present

## 2016-12-01 NOTE — Therapy (Signed)
[facility] [address] [place], [place], [address] Phone: [phone]   Fax:  [phone]  Occupational Therapy Evaluation  Patient Details  Name: Jill Howard MRN: [id number] Date of Birth: 02/05/1940 Referring Provider: Guido Sander, [place]   Encounter Date: 12/01/2016  OT End of Session - 12/01/16 1943    Visit Number  1    Number of Visits  17    Date for OT Re-Evaluation  01/30/17    Authorization Type  Medicare/AARP; G-code needed    Authorization - Visit Number  1    Authorization - Number of Visits  10    OT Start Time  1022    OT Stop Time  1105    OT Time Calculation (min)  43 min    Activity Tolerance  Patient tolerated treatment well    Behavior During Therapy  [facility] for tasks assessed/performed       Past Medical History:  Diagnosis Date  . Diabetes mellitus without complication (HCC)   . Hypertension   . Kidney stone   . Stroke (HCC)   . Vertigo     Past Surgical History:  Procedure Laterality Date  . ABDOMINAL HYSTERECTOMY    . KIDNEY STONE SURGERY      There were no vitals filed for this visit.  Subjective Assessment - 12/01/16 1037    Patient is accompained by:  Family member husband    Pertinent History  vertigo, fibromyalgia, DM, HTN, hyperlipidemia, ?vascular dementia     Limitations  fall risk, R homonymous hemianopsia    Patient Stated Goals  improve balance, improve strength, improve vision, would like to be able to drive again    Currently in Pain?  No/denies        [facility] OT Assessment - 12/01/16 1038      Assessment   Diagnosis  CVA    Referring Provider  Guido Sander, PA-C    Onset Date  10/24/16    Prior Therapy  HH OT, PT, ST      Precautions   Precautions  Fall;Other (comment) no driving; R visual field deficit      Balance Screen   Has the patient fallen in the past 6 months  No      Home  Environment   Family/patient expects to be discharged to:   Private residence    Living Arrangements  Spouse/significant other    Lives With  --      Prior Function   Level of Independence  Independent    Vocation  Retired      ADL   Eating/Feeding  Independent    Grooming  Independent    Management consultant  Independent      IADL   Prior Level of Function Shopping  husband performs    Prior Level of Function Light Housekeeping  both pt/husband performs    Light Housekeeping  -- 90% of what she did before, not vacuumn    Prior Level of Function Meal Prep  husband performed, pt performs some light tasks    Meal Prep  -- at baseline    Prior Level of Function Community Mobility  independent, driving    [employer]  Mobility  Relies on family or friends for transportation    Medication Management  Is responsible for taking medication in correct dosages at correct time    Prior Level of Function Financial Management  husband has always performed       Mobility   Mobility Status  -- See PT eval for details    Mobility Status Comments  ambulates without device, R visual field deficit hits door frame       Written Expression   Dominant Hand  Right    Handwriting  -- "not the same, but I can do it"      Vision - History   Baseline Vision  Wears glasses only for reading    Visual History  Cataracts hx of surgery    Patient Visual Report  Peripheral vision impairment R side, missing words on R side when reading    Additional Comments  light sensitivity incr (hx of some at baseline)      Vision Assessment   Visual Fields  Right homonymous Hemainpsia more difficulty with R inferior visual field    Patient has diffculty with activities due to visual impairment  -- bumping into doorframes    Comment  1.16M number cancellation  with 100% accuracy with reading glasses      Cognition   Overall Cognitive Status  Impaired/Different from baseline expressive aphasia    Area of Impairment  Attention;Memory    Attention Comments  decr in distracting environments and divided attention per pt/husband    Memory  Decreased short-term memory per pt/husband    Bradyphrenia  -- pt reports slower thinking      Sensation   Light Touch  Appears Intact    Additional Comments  denies change      Coordination   9 Hole Peg Test  Right;Left    Right 9 Hole Peg Test  25.72    Left 9 Hole Peg Test  25.03      ROM / Strength   AROM / PROM / Strength  AROM;Strength      AROM   Overall AROM   Within functional limits for tasks performed      Strength   Overall Strength  Deficits    Overall Strength Comments  LUE grossly 4+ to 5/5 proximal strength.  RUE proximal strength grossly 4+/5 with abduction 4/4      Hand Function   Right Hand Grip (lbs)  47    Left Hand Grip (lbs)  39 hx of L thumb injury/pain affecting L grip/pinch, use                      OT Education - 12/01/16 1932    Education provided  Yes    Education Details  OT eval results, OT POC, including education regarding homonymous hemiaopsia and that therapy for this will focus on compensation    Person(s) Educated  Patient;Spouse    Methods  Explanation    Comprehension  Verbalized understanding       OT Short Term Goals - 12/01/16 2026      OT SHORT TERM GOAL #1   Title  Pt will be independent with initial HEP for RUE strength.--check STGs 12/30/16    Time  4    Period  Weeks    Status  New      OT SHORT TERM GOAL #2   Title  Pt will verbalize understanding of visual compensation strategies.    Time  4  Period  Weeks    Status  New      OT SHORT TERM GOAL #3   Title  Pt will perform simple environmental scanning/navigation with at least 90% accuracy for incr safety for community activities.    Time  4    Period  Weeks    Status   New      OT SHORT TERM GOAL #4   Title  Pt will perform complex tabletop visual scanning and reading with at least 95% accuracy.    Time  4    Period  Weeks    Status  New        OT Long Term Goals - 12/01/16 2030      OT LONG TERM GOAL #1   Title  Pt will be independent with updated RUE strengthening HEP.--check LTGs 01/30/17    Time  8    Period  Weeks    Status  New      OT LONG TERM GOAL #2   Title  Pt will perform environmental scanning with simple divided attention with at least 90% accuracy for improved safety for community activities.    Time  8    Period  Weeks    Status  New      OT LONG TERM GOAL #3   Title  Pt will perform all previous home maintenance tasks independently.    Time  8    Period  Weeks    Status  New      OT LONG TERM GOAL #4   Title  Pt will perform environmental scanning/navigation in busy environment with at least 90% accuracy for improved safety for community activities.    Time  8    Period  Weeks    Status  New            Plan - 12/01/16 1955    Clinical Impression Statement  Pt presents with R-sided weakness, R visual field deficit, cognitive changes, decr balance, and mild decr coordination.  Pt would benefit from occupational therapy to maximize independence and in order to return to prior ADLs/IADLs and community activities.    Occupational Profile and client history currently impacting functional performance  Pt is a 76 y.o. female s/p CVA with R sided weakness, aphasia, and R homonymous hemianopsia.  PMH includes:  vertigo, fibromyalgia, DM, HTN, hyperlipidemia, ?vascular dementia.  Pt was independent prior to CVA including driving, but is now unable and expresses frustration with decr independence.    Occupational performance deficits (Please refer to evaluation for details):  ADL's;Leisure;IADL's;Social Participation    Rehab Potential  Good    OT Frequency  2x / week    OT Duration  8 weeks +eval    OT Treatment/Interventions   Self-care/ADL training;Therapeutic exercise;Building services engineerunctional Mobility Training;Therapeutic activities;Patient/family education;Neuromuscular education;Therapeutic exercises;Energy conservation;Visual/perceptual remediation/compensation;Cognitive remediation/compensation;Balance training;Fluidtherapy;Cryotherapy;DME and/or AE instruction;Moist Heat;Manual Therapy    Clinical Decision Making  Several treatment options, min-mod task modification necessary       Patient will benefit from skilled therapeutic intervention in order to improve the following deficits and impairments:  Impaired vision/preception, Decreased mobility, Decreased balance, Decreased endurance, Decreased cognition, Decreased coordination, Decreased strength, Decreased activity tolerance  Visit Diagnosis: Hemiplegia and hemiparesis following cerebral infarction affecting right dominant side (HCC)  Unsteadiness on feet  Visuospatial deficit  Other symptoms and signs involving cognitive functions following cerebral infarction  Attention and concentration deficit  Other lack of coordination  G-Codes - 12/01/16 2025    Functional Assessment Tool Used (Outpatient  only)  bumping into door frames on R side, needs education for visual compensation strategies and to be able to use consistently for incr safety    Functional Limitation  Self care    Self Care Current Status [phone]([id number])  At least 40 percent but less than 60 percent impaired, limited or restricted    Self Care Goal Status ([id number])  At least 1 percent but less than 20 percent impaired, limited or restricted       Problem List Patient Active Problem List   Diagnosis Date Noted  . CVA (cerebral vascular accident) (HCC) 10/25/2016  . 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 Essential hypertension 10/24/2016  . Diabetes mellitus with complication (HCC) 10/24/2016  . Chronic pain 10/24/2016    [facility] Sick ChildrenFREEMAN,Keontre Defino 12/01/2016, 8:35 PM  [facility] [address] [place], [place], [address] Phone: [phone]   Fax:  [phone]  Name: Jill NuttingCarol Howard MRN: [id number] Date of Birth: 1940/08/01   Willa FraterAngela Manolo Bosket, OTR/L [facility] [address] [place], [place]  [address] [phone] phone [phone] 12/01/16 8:35 PM

## 2016-12-01 NOTE — Therapy (Signed)
[facility] [address] [place], [place], [address] Phone: [phone]   Fax:  [phone]  Physical Therapy Evaluation  Patient Details  Name: Jill NuttingCarol Nerio MRN: [id number] Date of Birth: Jan 04, 1941 Referring Provider: Guido Sanderonna [place], [place]   Encounter Date: 12/01/2016  PT End of Session - 12/01/16 1229    Visit Number  1    Number of Visits  17    Date for PT Re-Evaluation  01/30/17    Authorization Type  Medicare/AARP G code and PN every 10th visit    PT Start Time  0932    PT Stop Time  1024    PT Time Calculation (min)  52 min    Activity Tolerance  Patient tolerated treatment well    Behavior During Therapy  [facility] for tasks assessed/performed       Past Medical History:  Diagnosis Date  . Diabetes mellitus without complication (HCC)   . Hypertension   . Kidney stone   . Stroke (HCC)   . Vertigo     Past Surgical History:  Procedure Laterality Date  . ABDOMINAL HYSTERECTOMY    . KIDNEY STONE SURGERY      Vitals:   12/01/16 0951  BP: 123/70  Pulse: 78     Subjective Assessment - 12/01/16 0951    Subjective  Pt presents to OPPT for evaluation s/p CVA.  Pt just completed HHPT, OT and SLP.  Pt reports continued difficulty with balance, fear of falling, going into the house from outside due to multiple levels and steps, endurance, RUE weakness, R visual field loss and speech impairments.    Patient is accompained by:  Family member    Pertinent History  bilat knee OA, vertigo, diabetes, TIA, HTN, hyperlipidemia, fibromyalgia, vascular dementia, and migraines    Limitations  Standing;Walking    Patient Stated Goals  To improve balance to prevent a fall and gain more strength in her hand    Currently in Pain?  No/denies history of knee OA pain, fibromyalgia   history of knee OA pain, fibromyalgia        [facility] PT Assessment - 12/01/16 0956      Assessment   Medical Diagnosis  L MCA CVA     Referring Provider  Guido Sanderonna [place], PA-C    Onset Date/Surgical Date  10/28/16    Hand Dominance  Right    Prior Therapy  HHPT, OT, SLP      Precautions   Precautions  Fall;Other (comment)    Precaution Comments  [facility]; R VISUAL FIELD LOSS, bilat knee OA, vertigo, diabetes, TIA, HTN, hyperlipidemia, fibromyalgia, vascular dementia, and migraines      Balance Screen   Has the patient fallen in the past 6 months  No    Has the patient had a decrease in activity level because of a fear of falling?   Yes    Is the patient reluctant to leave their home because of a fear of falling?   Yes      Home Environment   Living Environment  Private residence    Living Arrangements  Spouse/significant other    Type of Home  House    Home Access  Stairs to enter    Entrance Stairs-Number of Steps  2 onto side walk + 2 onto porch + 2 to enter house - 6 total    Entrance Stairs-Rails  None    Home Layout  One level  Home Equipment  None      Prior Function   Level of Independence  Independent    Vocation  Retired      Observation/Other Assessments   Focus on Therapeutic Outcomes (FOTO)   79 ( 21% limited, predicted 19% limitation by D/C)      Sensation   Light Touch  Appears Intact      ROM / Strength   AROM / PROM / Strength  Strength      Strength   Overall Strength  Deficits    Overall Strength Comments  weakness mostly as a result of knee OA; 4/5 hip flexion, 4+/5 knee extension and flexion, ankle DF      Ambulation/Gait   Ambulation/Gait  Yes    Ambulation/Gait Assistance  5: Supervision    Ambulation Distance (Feet)  115 Feet    Assistive device  None    Gait Pattern  Step-through pattern;Decreased arm swing - right;Decreased arm swing - left;Decreased trunk rotation    Ambulation Surface  Level;Indoor    Gait velocity  3.435ft/sec but slowed due to visual impairments    Stairs  Yes    Stairs Assistance  4: Min assist    Stairs Assistance Details (indicate  cue type and reason)  more cautious due to visual impairments    Stair Management Technique  No rails;Step to pattern;Forwards    Number of Stairs  4    Height of Stairs  6      Standardized Balance Assessment   Standardized Balance Assessment  Five Times Sit to Stand;10 meter walk test    Five times sit to stand comments   10.6 seconds from mat with UE pushing from mat    10 Meter Walk  9.13 seconds or 3.5 ft/sec             Objective measurements completed on examination: See above findings.              PT Education - 12/01/16 1227    Education provided  Yes    Education Details  Clinical findings, PT POC, goals and gradual return to previous activities    Person(s) Educated  Patient;Spouse    Methods  Explanation    Comprehension  Verbalized understanding       PT Short Term Goals - 12/01/16 1243      PT SHORT TERM GOAL #1   Title  Pt will participate in further balance and endurance testing - 6 min walk, FGA    Time  4    Period  Weeks    Status  New    Target Date  12/31/16      PT SHORT TERM GOAL #2   Title  Pt will participate in further vestibular assessment     Time  4    Period  Weeks    Status  New    Target Date  12/31/16      PT SHORT TERM GOAL #3   Title  Pt will improve gait velocity to > or = 3.8 ft/sec    Baseline  3.5 ft/sec    Time  4    Period  Weeks    Status  New    Target Date  12/31/16      PT SHORT TERM GOAL #4   Title  Will negotiate 2 stairs x 3 reps, no rails, alternating sequence with min A    Baseline  step to, min A    Time  4  Period  Weeks    Status  New    Target Date  12/31/16        PT Long Term Goals - 12/01/16 1248      PT LONG TERM GOAL #1   Title  Pt will perform balance and strengthening HEP with husband's supervision    Time  8    Period  Weeks    Status  New    Target Date  01/30/17      PT LONG TERM GOAL #2   Title  Pt will improve 6 min walk test distance by 100'     Baseline  TBD     Time  8    Period  Weeks    Status  New    Target Date  01/30/17      PT LONG TERM GOAL #3   Title  Pt will improve FGA score by 8 points from initial assessment    Baseline  TBD    Time  8    Period  Weeks    Status  New    Target Date  01/30/17      PT LONG TERM GOAL #4   Title  Pt will improve gait velocity to > or = 4.0 ft/sec for improved safety in community    Baseline  3.5    Time  8    Period  Weeks    Status  New    Target Date  01/30/17      PT LONG TERM GOAL #5   Title  Pt will improve overall function (FOTO) to > or = 85%    Baseline  79%    Time  8    Period  Weeks    Status  New    Target Date  01/30/17      Additional Long Term Goals   Additional Long Term Goals  Yes      PT LONG TERM GOAL #6   Title  Ambulate 1000' over uneven outdoor surfaces/curbs with supervision and negotiate 2 stairs x 3 reps without rails, alternating sequence with supervision    Time  8    Period  Weeks    Status  New    Target Date  01/30/17      PT LONG TERM GOAL #7   Title  Will report returning to 50% of baseline function/activities (household activities, carrying boxes from utility building, decorating for Christmas)    Time  8    Period  Weeks    Status  New    Target Date  01/30/17             Plan - 12/01/16 1232    Clinical Impression Statement  Pt is a 76 year old female presenting to OPPT neuro for evaluation of L MCA CVA with expressive aphasia, 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  Pt's PMH significant for the following: bilat knee OA, vertigo, diabetes, TIA, HTN, hyperlipidemia, fibromyalgia, vascular dementia, and migraines. The following deficits were noted during pt's exam: impaired strength, impaired balance, disequilibrium, R visual field loss, impaired endurance and gait.  Pt also reports difficulty emotionally coping with loss of independence.  Pt's gait speed indicates pt is safe for community  ambulation but is below normal for community dwelling adults.  Pt would benefit from skilled PT to address these impairments and functional limitations to maximize functional mobility independence and reduce falls risk.    History  and Personal Factors relevant to plan of care:  independent and was very active in community and church, bilat knee OA, vertigo, diabetes, TIA, HTN, hyperlipidemia, fibromyalgia, vascular dementia, and migraines    Clinical Presentation  Evolving    Clinical Presentation due to:  independent and was very active in community and church, increased risk for falls during to R visual field loss, bilat knee OA, vertigo, diabetes, TIA, HTN, hyperlipidemia, fibromyalgia, vascular dementia, and migraines    Clinical Decision Making  Moderate    Rehab Potential  Good    PT Frequency  2x / week    PT Duration  8 weeks    PT Treatment/Interventions  ADLs/Self Care Home Management;Gait training;Stair training;Functional mobility training;Therapeutic activities;Therapeutic exercise;Balance training;Neuromuscular re-education;Patient/family education;Energy conservation;Vestibular;Visual/perceptual remediation/compensation    PT Next Visit Plan  further assess endurance and balance: 6 min walk and FGA; reset goal baselines; initiate HEP    Recommended Other Services  neuropsych for emotional coping?    Consulted and Agree with Plan of Care  Patient;Family member/caregiver    Family Member Consulted  husband       Patient will benefit from skilled therapeutic intervention in order to improve the following deficits and impairments:  Decreased activity tolerance, Decreased balance, Decreased endurance, Decreased strength, Difficulty walking, Dizziness, Impaired UE functional use, Impaired vision/preception, Pain  Visit Diagnosis: Hemiplegia and hemiparesis following cerebral infarction affecting right dominant side (HCC)  Muscle weakness (generalized)  Unsteadiness on  feet  Dizziness and giddiness  Difficulty in walking, not elsewhere classified  G-Codes - 2016-12-26 1255    Functional Assessment Tool Used (Outpatient Only)  FOTO: 79; gait velocity 3.5 ft/sec    Functional Limitation  Mobility: Walking and moving around    Mobility: Walking and Moving Around Current Status [phone])  At least 20 percent but less than 40 percent impaired, limited or restricted    Mobility: Walking and Moving Around Goal Status [phone])  At least 1 percent but less than 20 percent impaired, limited or restricted        Problem List Patient Active Problem List   Diagnosis Date Noted  . CVA (cerebral vascular accident) (HCC) 10/25/2016  . 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 Essential hypertension 10/24/2016  . Diabetes mellitus with complication (HCC) 10/24/2016  . Chronic pain 10/24/2016    Dierdre Highman, PT, DPT 12-26-2016    12:56 PM    [facility] [address] [place], [place], [address] Phone: [phone]   Fax:  [phone]  Name: Cathi Hazan MRN: [id number] Date of Birth: Mar 18, 1940

## 2016-12-07 ENCOUNTER — Ambulatory Visit: Payer: Medicare Other | Admitting: Occupational Therapy

## 2016-12-07 ENCOUNTER — Encounter: Payer: Self-pay | Admitting: Occupational Therapy

## 2016-12-07 DIAGNOSIS — I69318 Other symptoms and signs involving cognitive functions following cerebral infarction: Secondary | ICD-10-CM

## 2016-12-07 DIAGNOSIS — M6281 Muscle weakness (generalized): Secondary | ICD-10-CM

## 2016-12-07 DIAGNOSIS — I69351 Hemiplegia and hemiparesis following cerebral infarction affecting right dominant side: Secondary | ICD-10-CM | POA: Diagnosis not present

## 2016-12-07 DIAGNOSIS — R41840 Attention and concentration deficit: Secondary | ICD-10-CM

## 2016-12-07 DIAGNOSIS — R278 Other lack of coordination: Secondary | ICD-10-CM

## 2016-12-07 DIAGNOSIS — R41842 Visuospatial deficit: Secondary | ICD-10-CM

## 2016-12-07 DIAGNOSIS — R2681 Unsteadiness on feet: Secondary | ICD-10-CM

## 2016-12-07 NOTE — Therapy (Signed)
[facility] [address] [place], [place], [address] Phone: [phone]   Fax:  [phone]  Occupational Therapy Treatment  Patient Details  Name: Jill Howard MRN: [id number] Date of Birth: [birth year] Referring Provider: Guido Sander, [place]   Encounter Date: 12/07/2016  OT End of Session - 12/07/16 1252    Visit Number  2    Number of Visits  17    Date for OT Re-Evaluation  01/30/17    Authorization Type  Medicare/AARP; G-code needed    Authorization - Visit Number  2    Authorization - Number of Visits  10    OT Start Time  1102    OT Stop Time  1146    OT Time Calculation (min)  44 min    Activity Tolerance  Patient tolerated treatment well    Behavior During Therapy  [facility] for tasks assessed/performed       Past Medical History:  Diagnosis Date  . Diabetes mellitus without complication (HCC)   . Hypertension   . Kidney stone   . Stroke (HCC)   . Vertigo     Past Surgical History:  Procedure Laterality Date  . ABDOMINAL HYSTERECTOMY    . KIDNEY STONE SURGERY      There were no vitals filed for this visit.  Subjective Assessment - 12/07/16 1152    Subjective   My speech is what is aggravating me the most    Patient is accompained by:  Family member    Pertinent History  vertigo, fibromyalgia, DM, HTN, hyperlipidemia, ?vascular dementia     Limitations  fall risk, R homonymous hemianopsia    Patient Stated Goals  improve balance, improve strength, improve vision, would like to be able to drive again    Currently in Pain?  No/denies    Multiple Pain Sites  No                   OT Treatments/Exercises (OP) - 12/07/16 0001      ADLs   Functional Mobility  Discussed the need to overcorrect vision for environmental navigation.  Patient had a near fall situation when walking in community and almost stepped off a ramp.  Daughter corrected patient just in time.  Patient able to state that  she tends to move too quickly - husband concurs.      Writing  Patient is an avid typist, and worked today on typing free thoughts, and also copying from Armed forces operational officer.  Demonstrated a reading guide to help provide a contrast to help patient find her place visually.  Patient preferred yellow on white contrast.  Patient easily frustrated with errors and needing extra time to recognize and correct errors.  Difficult to ascertain if errors were due to cognition or language- appear to be both.  When patient slows herself down she has better accuracy.      Leisure  Encouraged patient to attempt simple hand sewing projects to challenge vision at home.      ADL Comments  Husband aksed when patient would be able to be left alone for an hour at home.  Indicated that patient is able now to be left at home for short periods of time.              OT Education - 12/07/16 1251    Education provided  Yes    Education Details  Encouraged home activities to challenge vision and cognition - typing task, and hand sewing  project.  Discussed with aptient and spouse that she can be left alone for brief periods at this time.      Person(s) Educated  Patient;Spouse    Methods  Explanation;Demonstration    Comprehension  Verbalized understanding;Need further instruction       OT Short Term Goals - 12/07/16 1256      OT SHORT TERM GOAL #1   Title  Pt will be independent with initial HEP for RUE strength.--check STGs 12/30/16    Status  On-going      OT SHORT TERM GOAL #2   Title  Pt will verbalize understanding of visual compensation strategies.    Status  On-going      OT SHORT TERM GOAL #3   Title  Pt will perform simple environmental scanning/navigation with at least 90% accuracy for incr safety for community activities.    Status  On-going      OT SHORT TERM GOAL #4   Title  Pt will perform complex tabletop visual scanning and reading with at least 95% accuracy.    Status  On-going        OT  Long Term Goals - 12/07/16 1256      OT LONG TERM GOAL #1   Title  Pt will be independent with updated RUE strengthening HEP.--check LTGs 01/30/17    Status  On-going      OT LONG TERM GOAL #2   Title  Pt will perform environmental scanning with simple divided attention with at least 90% accuracy for improved safety for community activities.    Status  On-going      OT LONG TERM GOAL #3   Title  Pt will perform all previous home maintenance tasks independently.    Status  On-going      OT LONG TERM GOAL #4   Title  Pt will perform environmental scanning/navigation in busy environment with at least 90% accuracy for improved safety for community activities.    Status  On-going            Plan - 12/07/16 1252    Clinical Impression Statement  Patient was fairly active prior to her stroke, and she is eager to return to more active lifestyle.  She expresses frustration at her current situation, and lacks patience for herself when she makes errors.  Patient is beginning to compensate for hemianopsia with close activity, but still needs prompting in new, community environments.      Rehab Potential  Good    OT Frequency  2x / week    OT Duration  8 weeks    OT Treatment/Interventions  Self-care/ADL training;Therapeutic exercise;Building services engineerunctional Mobility Training;Therapeutic activities;Patient/family education;Neuromuscular education;Therapeutic exercises;Energy conservation;Visual/perceptual remediation/compensation;Cognitive remediation/compensation;Balance training;Fluidtherapy;Cryotherapy;DME and/or AE instruction;Moist Heat;Manual Therapy    Plan  Check how typing went, and hand sewing project for cat.  Start HEP shoulder strengthening    Consulted and Agree with Plan of Care  Patient;Family member/caregiver       Patient will benefit from skilled therapeutic intervention in order to improve the following deficits and impairments:  Impaired vision/preception, Decreased mobility, Decreased  balance, Decreased endurance, Decreased cognition, Decreased coordination, Decreased strength, Decreased activity tolerance  Visit Diagnosis: Hemiplegia and hemiparesis following cerebral infarction affecting right dominant side (HCC)  Unsteadiness on feet  Visuospatial deficit  Other symptoms and signs involving cognitive functions following cerebral infarction  Attention and concentration deficit  Other lack of coordination  Muscle weakness (generalized)    Problem List Patient Active Problem List   Diagnosis Date  Noted  . CVA (cerebral vascular accident) (HCC) 10/25/2016  . 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 Essential hypertension 10/24/2016  . Diabetes mellitus with complication (HCC) 10/24/2016  . Chronic pain 10/24/2016    Collier SalinaGellert, Kristin M 12/07/2016, 12:58 PM  [facility] [address] [place], [place], [address] Phone: [phone]   Fax:  [phone]  Name: Nilsa NuttingCarol Leist MRN: [id number] Date of Birth: 1940/10/31

## 2016-12-08 ENCOUNTER — Ambulatory Visit: Payer: Medicare Other | Admitting: Speech Pathology

## 2016-12-08 ENCOUNTER — Other Ambulatory Visit: Payer: Self-pay

## 2016-12-08 DIAGNOSIS — I69351 Hemiplegia and hemiparesis following cerebral infarction affecting right dominant side: Secondary | ICD-10-CM | POA: Diagnosis not present

## 2016-12-08 DIAGNOSIS — R41841 Cognitive communication deficit: Secondary | ICD-10-CM

## 2016-12-08 DIAGNOSIS — R4701 Aphasia: Secondary | ICD-10-CM

## 2016-12-08 NOTE — Patient Instructions (Signed)
Tips for Talking with People who have Aphasia  . Say one thing at a time . Don't  rush - slow down, be patient . Talk face to face . Reduce background noise . Relax - be natural . Use pen and paper . Write down key words . Draw diagrams or pictures . Don't pretend you understand . Ask what helps . Recap - check you both understand . Be a partner, not a therapist   Aphasia does not affect intelligence, only language. The person with aphasia can still: make decisions, have opinions, and socialize.   Describing words  What group does it belong to?  What do I use it for?  Where can I find it?  What does it LOOK like?  What other words go with it?  What is the 1st sound of the word?    Many Ways to Communicate  Describe it Write it Draw it Gesture it Use related words  There's an App for that: Family Feud, Heads up, Stop-fun categories, What if, Conversation TherAPPy  Provided by: Jill Howard L. ST, [phone]   

## 2016-12-08 NOTE — Therapy (Signed)
[facility] [address] [place], [place], [address] Phone: [phone]   Fax:  [phone]  Speech Language Pathology Evaluation  Patient Details  Name: Jill Howard MRN: [id number] Date of Birth: 05-24-1940 Referring Provider: Guido Sander PA-C   Encounter Date: 12/08/2016  End of Session - 12/08/16 1529    Visit Number  1    Number of Visits  17    Date for SLP Re-Evaluation  02/03/17       Past Medical History:  Diagnosis Date  . Diabetes mellitus without complication (HCC)   . Hypertension   . Kidney stone   . Stroke (HCC)   . Vertigo     Past Surgical History:  Procedure Laterality Date  . ABDOMINAL HYSTERECTOMY    . KIDNEY STONE SURGERY      There were no vitals filed for this visit.  Subjective Assessment - 12/08/16 1510    Subjective  "I'm here because I can't talk"    Patient is accompained by:  Family member spouse         SLP Evaluation [facility] - 12/08/16 1511      SLP Visit Information   SLP Received On  12/08/16    Referring Provider  Guido Sander PA-C    Onset Date  10/28/16    Medical Diagnosis  left CVA      Subjective   Patient/Family Stated Goal  "To learn to speak better"      Pain Assessment   Currently in Pain?  No/denies      General Information   HPI  Jill Howard is a 76 y.o. female was past medical history of diabetes, hypertension, hyperlipidemia, fibromyalgia, ?vascular dementia (secondary to poorly controlled DM) who was in her usual state of health about 2 weeks ago, which is when she started noticing some difficulty using her right hand. She found it hard to use utensils. She had been having a hard time walking at times with leg weakness. These symptoms were off and on and completely resolved in between, hence she did not seek medical attention.  MRI of the brain showed strokes in the posterior division of the left MCA territory. The MRA of the brain also showed  stenosis versus subocclusive thrombus in the left M1.LKW:    Mobility Status  walks independently, receiving PT       Prior Functional Status   Cognitive/Linguistic Baseline  Baseline deficits    Baseline deficit details  ? vascular dementia, however pt I with high level ADL's prior to CVA    Type of Home  House     Lives With  Spouse    Available Support  Family    Vocation  Retired      IT consultant   Overall Cognitive Status  Impaired/Different from baseline    Area of Impairment  Attention;Memory    Current Attention Level  Selective    Memory  Decreased short-term memory      Auditory Comprehension   Overall Auditory Comprehension  Impaired    Yes/No Questions  Impaired    Complex Questions  75-100% accurate    Commands  Within Functional Limits    Conversation  Simple    Other Conversation Comments  Pt reports difficulty following/understanding group conversations    Interfering Components  Attention;Anxiety;Processing speed;Working Probation officer;Extra processing time;Slowed speech      Visual Recognition/Discrimination   Discrimination  Exceptions to [facility] right hononomous hemianopsia  Reading Comprehension   Reading Status  Within funtional limits      Expression   Primary Mode of Expression  Verbal      Verbal Expression   Overall Verbal Expression  Impaired    Initiation  No impairment    Automatic Speech  Name;Counting;Day of week;Month of year WFL    Level of Generative/Spontaneous Verbalization  Conversation    Repetition  Impaired    Level of Impairment  Phrase level    Naming  Impairment    Responsive  Not tested    Confrontation  50-74% accurate    Convergent  -- BNT    Divergent  50-74% accurate    Verbal Errors  Phonemic paraphasias;Aware of errors;Inconsistent    Pragmatics  No impairment    Effective Techniques  Phonemic cues      Written Expression   Dominant Hand  Right    Written Expression  Exceptions to [facility]     Self Formulation Ability  Phrase    Overall Writen Expression  mild aphasic errors, pt usually aware      Oral Motor/Sensory Function   Overall Oral Motor/Sensory Function  Appears within functional limits for tasks assessed      Motor Speech   Overall Motor Speech  Appears within functional limits for tasks assessed      Standardized Assessments   Standardized Assessments   [place] Naming Test-2nd edition    [place] Naming Test-2nd edition   38/60 - 1SD below mean                   ADULT SLP TREATMENT - 12/08/16 1411      General Information   Behavior/Cognition  Alert;Cooperative;Pleasant mood;Agitated    Patient Positioning  Postural control interferes with function        SLP Education - 12/08/16 1528    Education provided  Yes    Education Details  goals for ST, aphasia ed, language activities to do at home    Person(s) Educated  Patient;Spouse    Methods  Explanation;Demonstration;Handout    Comprehension  Verbalized understanding;Verbal cues required;Need further instruction       SLP Short Term Goals - 12/08/16 1547      SLP SHORT TERM GOAL #1   Title  Pt will name 10 items for a simple category with occasional min A over 2 sessions    Time  4    Period  Weeks    Status  New      SLP SHORT TERM GOAL #2   Title  Pt will write at sentnece level, ID and correct her errors with occasional min A over 2 sessions    Time  4    Period  Weeks    Status  New      SLP SHORT TERM GOAL #3   Title  Pt will utilize compensations for aphasia in structured language tasks with occasional min cues over 3 sessions    Time  4    Period  Weeks    Status  New      SLP SHORT TERM GOAL #4   Title  Cognitive linguistic eval to be completed, goals added if indicated    Time  2    Period  Weeks    Status  New       SLP Long Term Goals - 12/08/16 1544      SLP LONG TERM GOAL #1   Title  Pt will name 8 items in  a mildly complex category with occasional min A over 2  sessions    Time  8    Period  Weeks    Status  New      SLP LONG TERM GOAL #2   Title  Pt will write 2-3 sentnece paragraph, correcting her errors, with occasional min A over 2 sessions    Time  8    Period  Weeks    Status  New      SLP LONG TERM GOAL #3   Title  Pt will utilize compensations for aphasia over 12 minute milldy complex conversation with 2 or less requests for repair/clarification by ST over 3 sessions    Time  8    Period  Weeks    Status  New       Plan - 12/08/16 1529    Clinical Impression Statement  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 She received some HH ST at that time. She is referred for outpt ST due to persisting aphasia. 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 Auditory comprehension is impaired for complex yes/no questions and mildly complex conversation. Reading comprehension is relatively intact for areas assessed. She reports success reading at home., despite right hemianopsia. Written expression produced aphasic errors at phrase level.  Naming is significantly impaired. She scored a 38/60 on the [employer] Naming Test, which is below average for her age. Automatic speech and rapid alternating speech tasks are intact Mrs. Critcher's conversation demonstrates word finding difficulty and phonemic paraphasias. She and her spouse report daily frustration with difficulty communicating. At times, her spouse can't understand her. Pt became tearful. Memory and attention impairments are also evidient. Pt reports she can no longer "multi-task." Further cognitive assessment may be warranted. I recommend skilled ST to maximize verbal exrpression to improve pt's independence and to reduce caregiver (spouse) burden.     Speech Therapy Frequency  2x / week    Duration  -- 8 weeks or 16 visits    Treatment/Interventions  Compensatory strategies;Patient/family education;Functional tasks;Cueing  hierarchy;Environmental controls;Cognitive reorganization;Multimodal communcation approach;Language facilitation;Internal/external aids;SLP instruction and feedback    Potential to Achieve Goals  Good    Potential Considerations  Ability to learn/carryover information    Consulted and Agree with Plan of Care  Patient;Family member/caregiver    Family Member Consulted  sposue, Jim (?)       Patient will benefit from skilled therapeutic intervention in order to improve the following deficits and impairments:   Aphasia - Plan: SLP plan of care cert/re-cert  Cognitive communication deficit - Plan: SLP plan of care cert/re-cert  G-Codes - 12/08/16 1548    Functional Assessment Tool Used  NOMS    Functional Limitations  Spoken language expressive    Spoken Language Expression Current Status [phone](G9162)  At least 20 percent but less than 40 percent impaired, limited or restricted    Spoken Language Expression Goal Status ([id number])  At least 1 percent but less than 20 percent impaired, limited or restricted       Problem List Patient Active Problem List   Diagnosis Date Noted  . CVA (cerebral vascular accident) (HCC) 10/25/2016  . 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 Essential hypertension 10/24/2016  . Diabetes mellitus with complication (HCC) 10/24/2016  . Chronic pain 10/24/2016    Lovvorn, Radene JourneyLaura Ann MS, CCC-SLP 12/08/2016, 3:53 PM  [facility] [address] [place], [place], [address] Phone: [phone]  Fax:  [phone]  Name: Nilsa NuttingCarol Lacroix MRN: [id number] Date of Birth: Dec 18, 1940

## 2016-12-09 ENCOUNTER — Ambulatory Visit: Payer: Medicare Other | Admitting: Physical Therapy

## 2016-12-09 DIAGNOSIS — I69351 Hemiplegia and hemiparesis following cerebral infarction affecting right dominant side: Secondary | ICD-10-CM

## 2016-12-09 DIAGNOSIS — R42 Dizziness and giddiness: Secondary | ICD-10-CM

## 2016-12-09 DIAGNOSIS — R278 Other lack of coordination: Secondary | ICD-10-CM

## 2016-12-09 DIAGNOSIS — R262 Difficulty in walking, not elsewhere classified: Secondary | ICD-10-CM

## 2016-12-09 DIAGNOSIS — R2681 Unsteadiness on feet: Secondary | ICD-10-CM

## 2016-12-09 NOTE — Therapy (Signed)
[facility] [address] [place], [place], [address] Phone: [phone]   Fax:  [phone]  Physical Therapy Treatment  Patient Details  Name: Jill Howard MRN: [id number] Date of Birth: May 08, 1940 Referring Provider: Guido Sanderonna [place], [place]   Encounter Date: 12/09/2016  PT End of Session - 12/09/16 1416    Visit Number  2    Number of Visits  17    Date for PT Re-Evaluation  01/30/17    Authorization Type  Medicare/AARP G code and PN every 10th visit    PT Start Time  1316    PT Stop Time  1400    PT Time Calculation (min)  44 min    Equipment Utilized During Treatment  Gait belt    Activity Tolerance  Patient tolerated treatment well    Behavior During Therapy  [facility] for tasks assessed/performed       Past Medical History:  Diagnosis Date  . Diabetes mellitus without complication (HCC)   . Hypertension   . Kidney stone   . Stroke (HCC)   . Vertigo     Past Surgical History:  Procedure Laterality Date  . ABDOMINAL HYSTERECTOMY    . KIDNEY STONE SURGERY      There were no vitals filed for this visit.  Subjective Assessment - 12/09/16 1414    Subjective  Pt states that she "redid" her closet a couple hours prior to therapy and did something to her lower back. She also reports that she's taking medication for her bronchitis and it is drying her mouth out and making her a little loopy.    Patient is accompained by:  Family member    Pertinent History  bilat knee OA, vertigo, diabetes, TIA, HTN, hyperlipidemia, fibromyalgia, vascular dementia, and migraines    Limitations  Standing;Walking    Patient Stated Goals  To improve balance to prevent a fall and gain more strength in her hand    Currently in Pain?  Yes    Pain Score  4     Pain Location  Back    Pain Orientation  Lower    Pain Descriptors / Indicators  Discomfort    Pain Onset  Today         [facility] PT Assessment - 12/09/16 1325      6 Minute  Walk- Baseline   6 Minute Walk- Baseline  yes    BP (mmHg)  130/86    HR (bpm)  98    02 Sat (%RA)  95 %    Modified Borg Scale for Dyspnea  0- Nothing at all    Perceived Rate of Exertion (Borg)  6-      6 Minute walk- Post Test   6 Minute Walk Post Test  yes    BP (mmHg)  124/70    HR (bpm)  95    02 Sat (%RA)  94 %    Modified Borg Scale for Dyspnea  2- Mild shortness of breath    Perceived Rate of Exertion (Borg)  11- Fairly light      6 minute walk test results    Aerobic Endurance Distance Walked  1041    Endurance additional comments  Pt performed 6-min walk test with S only, without rest breaks and at a comfortable pace without SOB or noticeable exertion.      Functional Gait  Assessment   Gait assessed   Yes    Gait Level Surface  Walks 20 ft in  less than 7 sec but greater than 5.5 sec, uses assistive device, slower speed, mild gait deviations, or deviates 6-10 in outside of the 12 in walkway width.    Change in Gait Speed  Able to smoothly change walking speed without loss of balance or gait deviation. Deviate no more than 6 in outside of the 12 in walkway width.    Gait with Horizontal Head Turns  Performs head turns smoothly with no change in gait. Deviates no more than 6 in outside 12 in walkway width    Gait with Vertical Head Turns  Performs head turns with no change in gait. Deviates no more than 6 in outside 12 in walkway width.    Gait and Pivot Turn  Pivot turns safely within 3 sec and stops quickly with no loss of balance.    Step Over Obstacle  Is able to step over one shoe box (4.5 in total height) without changing gait speed. No evidence of imbalance.    Gait with Narrow Base of Support  Is able to ambulate for 10 steps heel to toe with no staggering.    Gait with Eyes Closed  Walks 20 ft, uses assistive device, slower speed, mild gait deviations, deviates 6-10 in outside 12 in walkway width. Ambulates 20 ft in less than 9 sec but greater than 7 sec.    Ambulating  Backwards  Walks 20 ft, uses assistive device, slower speed, mild gait deviations, deviates 6-10 in outside 12 in walkway width.    Steps  Two feet to a stair, must use rail.    Total Score  24    FGA comment:  Pt challenged with EC activity, stairs, walking backwards and stepping over obstacles. Pt performed test with Supervision for safety.         [facility] Adult PT Treatment/Exercise - 12/09/16 0001      Neuro Re-ed    Neuro Re-ed Details   See pt instruction for HEP. Min A needed for safety and for balance correction.         PT Education - 12/09/16 1708    Education provided  Yes       PT Short Term Goals - 12/10/16 0025      PT SHORT TERM GOAL #1   Title  Pt will participate in further balance and endurance testing - 6 min walk, FGA    Baseline  see LTGs    Status  Achieved      PT SHORT TERM GOAL #2   Title  Pt will participate in further vestibular assessment     Time  4    Period  Weeks    Status  On-going      PT SHORT TERM GOAL #3   Title  Pt will improve gait velocity to > or = 3.8 ft/sec    Baseline  3.5 ft/sec    Time  4    Period  Weeks    Status  On-going      PT SHORT TERM GOAL #4   Title  Will negotiate 2 stairs x 3 reps, no rails, alternating sequence with min A    Baseline  step to, min A    Time  4    Period  Weeks    Status  On-going       PT Long Term Goals - 12/10/16 0026      PT LONG TERM GOAL #1   Title  Pt will perform balance and strengthening HEP with  husband's supervision    Time  8    Period  Weeks    Status  On-going      PT LONG TERM GOAL #2   Title  Pt will improve 6 min walk test distance by 100'     Baseline  12/09/16: 1041 no AD at baseline with no rest breaks taken    Time  8    Period  Weeks    Status  On-going      PT LONG TERM GOAL #3   Title  Pt will improve FGA score by 8 points from initial assessment    Baseline  12/09/16: 24/30 scored today for baseline    Time  8    Period  Weeks    Status  On-going       PT LONG TERM GOAL #4   Title  Pt will improve gait velocity to > or = 4.0 ft/sec for improved safety in community    Baseline  3.5    Time  8    Period  Weeks    Status  On-going      PT LONG TERM GOAL #5   Title  Pt will improve overall function (FOTO) to > or = 85%    Baseline  79%    Time  8    Period  Weeks    Status  On-going      PT LONG TERM GOAL #6   Title  Ambulate 1000' over uneven outdoor surfaces/curbs with supervision and negotiate 2 stairs x 3 reps without rails, alternating sequence with supervision    Time  8    Period  Weeks    Status  On-going      PT LONG TERM GOAL #7   Title  Will report returning to 50% of baseline function/activities (household activities, carrying boxes from utility building, decorating for Christmas)    Time  8    Period  Weeks    Status  On-going          Plan - 12/09/16 1417    Clinical Impression Statement  Pt ambulated 1041 ft while performing a 6-min walk test. Pt performed test without feeling exerted and without rest breaks. Pt scored 24/30 on FGA test, which states that she is a medium risk fall. Areas of concern are balance, speed, stepping over obstacles, and walking backwards. Pt given HEP to improve balance and stability for safer ambulation and functional mobility. Pt will benefit from further PT session to improve balance, LE strengthening and endurance.    Rehab Potential  Good    PT Frequency  2x / week    PT Duration  8 weeks    PT Treatment/Interventions  ADLs/Self Care Home Management;Gait training;Stair training;Functional mobility training;Therapeutic activities;Therapeutic exercise;Balance training;Neuromuscular re-education;Patient/family education;Energy conservation;Vestibular;Visual/perceptual remediation/compensation    PT Next Visit Plan  Add to HEP, balance activities, gait on compliant surfaces, stairs.    Consulted and Agree with Plan of Care  Patient       Patient will benefit from skilled  therapeutic intervention in order to improve the following deficits and impairments:  Decreased activity tolerance, Decreased balance, Decreased endurance, Decreased strength, Difficulty walking, Dizziness, Impaired UE functional use, Impaired vision/preception, Pain  Visit Diagnosis: Hemiplegia and hemiparesis following cerebral infarction affecting right dominant side (HCC)  Unsteadiness on feet  Other lack of coordination  Dizziness and giddiness  Difficulty in walking, not elsewhere classified     Problem List Patient Active Problem List   Diagnosis  Date Noted  . CVA (cerebral vascular accident) (HCC) 10/25/2016  . 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 Essential hypertension 10/24/2016  . Diabetes mellitus with complication (HCC) 10/24/2016  . Chronic pain 10/24/2016    Kipp Laurence, SPTA 12/09/2016, 5:12 PM  [facility] [address] [place], [place], [address] Phone: [phone]   Fax:  [phone]  Name: Jill Howard MRN: [id number] Date of Birth: 31-Oct-1940  This note has been reviewed and edited by supervising CI. Adjusted baselines on STGs and LTGs for 6 minute walk test and FGA.  Sallyanne Kuster, PTA, [facility] [address], [address] [place], [place] [address] [phone] 12/10/16, 12:31 AM

## 2016-12-09 NOTE — Patient Instructions (Addendum)
Feet Together, Head Motion - Eyes Closed    Stand in a corner with chair in front of you for safety. With eyes closed and feet together, stand for 10 seconds and get your balance. Move head slowly up and down, repeat 8 times. Move head slowly right to left, repeat 8 times. Move head diagonally (up right to bottom left, up left to bottom right), repeat 8 times. Do _1_ session per day.  Copyright  VHI. All rights reserved.

## 2016-12-12 ENCOUNTER — Ambulatory Visit: Payer: Medicare Other | Admitting: Occupational Therapy

## 2016-12-12 ENCOUNTER — Ambulatory Visit: Payer: Medicare Other | Admitting: Physical Therapy

## 2016-12-12 ENCOUNTER — Encounter: Payer: Self-pay | Admitting: Physical Therapy

## 2016-12-12 ENCOUNTER — Encounter: Payer: Self-pay | Admitting: Occupational Therapy

## 2016-12-12 DIAGNOSIS — I69318 Other symptoms and signs involving cognitive functions following cerebral infarction: Secondary | ICD-10-CM

## 2016-12-12 DIAGNOSIS — R41840 Attention and concentration deficit: Secondary | ICD-10-CM

## 2016-12-12 DIAGNOSIS — R2681 Unsteadiness on feet: Secondary | ICD-10-CM

## 2016-12-12 DIAGNOSIS — I69351 Hemiplegia and hemiparesis following cerebral infarction affecting right dominant side: Secondary | ICD-10-CM | POA: Diagnosis not present

## 2016-12-12 DIAGNOSIS — R278 Other lack of coordination: Secondary | ICD-10-CM

## 2016-12-12 DIAGNOSIS — R42 Dizziness and giddiness: Secondary | ICD-10-CM

## 2016-12-12 DIAGNOSIS — R262 Difficulty in walking, not elsewhere classified: Secondary | ICD-10-CM

## 2016-12-12 DIAGNOSIS — R41842 Visuospatial deficit: Secondary | ICD-10-CM

## 2016-12-12 NOTE — Patient Instructions (Signed)
1. Look for the edge of objects (to the left and/or right) so that you make sure you are seeing all of an object 2. Turn your head when walking, scan from side to side, particularly in busy environments 3. Use an organized scanning pattern. It's usually easier to scan from top to bottom, and left to right (like you are reading) 4. Double check yourself 5. Use a line guide (like a blank piece of paper) or your finger when reading 6. If necessary, place brightly colored tape at end of table or work area as a reminder to always look until you see the tape.      Strengthening: Resisted Flexion   Attach tube to door.  Hold tubing with one arm at side. Pull forward and up. Move shoulder through pain-free range of motion. Repeat 15 times per set.  Do 1-2 sessions per day.    Strengthening: Resisted Extension   Attach one end to door.  Hold tubing in one hand, arm forward. Pull arm back, elbow straight. Repeat 15 times per set. Do 1-2 sessions per day.   Resisted Horizontal Abduction: Bilateral   Sit or stand, tubing in both hands, arms out in front. Keeping arms straight, pinch shoulder blades together and stretch arms out. Repeat 15 times per set.  Do 1-2 sessions per day.   Elbow Flexion: Resisted   Hold tubing wrapped around  One hand and and other end secured under foot, curl arm up as far as possible. Repeat 15 times per set.  Do 1-2 sessions per day.    Elbow Extension: Resisted   Hold band in left hand with elbow bent. Straighten  other elbow. Repeat 15 times per set.  Do 1-2 sessions per day.

## 2016-12-12 NOTE — Therapy (Signed)
[facility] [address] [place], [place], [address] Phone: [phone]   Fax:  [phone]  Physical Therapy Treatment  Patient Details  Name: Jill NuttingCarol Howard MRN: [id number] Date of Birth: Sep 19, 1940 Referring Provider: Guido Sanderonna [place], [place]   Encounter Date: 12/12/2016  PT End of Session - 12/12/16 0850    Visit Number  3    Number of Visits  17    Date for PT Re-Evaluation  01/30/17    Authorization Type  Medicare/AARP G code and PN every 10th visit    PT Start Time  0845    PT Stop Time  0930    PT Time Calculation (min)  45 min    Equipment Utilized During Treatment  Gait belt    Activity Tolerance  Patient tolerated treatment well    Behavior During Therapy  [facility] for tasks assessed/performed       Past Medical History:  Diagnosis Date  . Diabetes mellitus without complication (HCC)   . Hypertension   . Kidney stone   . Stroke (HCC)   . Vertigo     Past Surgical History:  Procedure Laterality Date  . ABDOMINAL HYSTERECTOMY    . KIDNEY STONE SURGERY      There were no vitals filed for this visit.  Subjective Assessment - 12/12/16 0850    Subjective  No new complaints. No falls or pain to report.     Patient is accompained by:  Family member    Pertinent History  bilat knee OA, vertigo, diabetes, TIA, HTN, hyperlipidemia, fibromyalgia, vascular dementia, and migraines    Limitations  Standing;Walking    Patient Stated Goals  To improve balance to prevent a fall and gain more strength in her hand    Currently in Pain?  No/denies       [facility] Treatment/Exercise - 12/12/16 1255      Ambulation/Gait   Stairs  Yes    Stairs Assistance  4: Min guard    Stairs Assistance Details (indicate cue type and reason)  Pt ascended/descended stairs x4, initially using no rails with an alternating pattern ascending and descending with a step to pattern. Pt was then able to descend with an alternating  pattern with use of one rail. Min guard for safety.     Stair Management Technique  No rails;Step to pattern;Alternating pattern;Forwards    Number of Stairs  4 x4     Height of Stairs  6      Neuro Re-ed    Neuro Re-ed Details   Pt on red mat beside counter for UE support as needed (no UE support requred during activity) performing fwds marching/bkwds walking, side stepping, tandem walking x10 each with no LOB but some sway with tandem, cues for instruction and for slow/controlled movements while walking.      Exercises   Exercises  Knee/Hip;Ankle      Knee/Hip Exercises: Standing   Heel Raises  Both;1 set;10 reps;3 seconds    Heel Raises Limitations  Pt performed in front of counter for UE support as needed.     Hip Abduction  AROM;Stengthening;Both;1 set;10 reps;Knee straight    Abduction Limitations  Pt in front of counter for UE support as needed, cues for posture and to avoid lateral leaning.     Hip Extension  AROM;Stengthening;Both;1 set;10 reps;Knee straight    Extension Limitations  Pt in front of counter for UE support as needed. Pt required cues for proper posture during exercise  and to avoid forward lean.     Other Standing Knee Exercises  Pt standing in front of counter performing mini squat x10 reps.     Other Standing Knee Exercises  Pt standing in front of counter reaching up with one UE and off weighting opposite LE to clear foot from floor, alternating pattern x10 reps.         PT Education - 12/12/16 1314    Education provided  Yes    Education Details  LE strengthening added to HEP    Person(s) Educated  Patient    Methods  Explanation;Demonstration;Handout    Comprehension  Verbalized understanding;Returned demonstration       PT Short Term Goals - 12/10/16 0025      PT SHORT TERM GOAL #1   Title  Pt will participate in further balance and endurance testing - 6 min walk, FGA    Baseline  see LTGs    Status  Achieved      PT SHORT TERM GOAL #2   Title  Pt  will participate in further vestibular assessment     Time  4    Period  Weeks    Status  On-going      PT SHORT TERM GOAL #3   Title  Pt will improve gait velocity to > or = 3.8 ft/sec    Baseline  3.5 ft/sec    Time  4    Period  Weeks    Status  On-going      PT SHORT TERM GOAL #4   Title  Will negotiate 2 stairs x 3 reps, no rails, alternating sequence with min A    Baseline  step to, min A    Time  4    Period  Weeks    Status  On-going        PT Long Term Goals - 12/10/16 0026      PT LONG TERM GOAL #1   Title  Pt will perform balance and strengthening HEP with husband's supervision    Time  8    Period  Weeks    Status  On-going      PT LONG TERM GOAL #2   Title  Pt will improve 6 min walk test distance by 100'     Baseline  12/09/16: 1041 no AD at baseline with no rest breaks taken    Time  8    Period  Weeks    Status  On-going      PT LONG TERM GOAL #3   Title  Pt will improve FGA score by 8 points from initial assessment    Baseline  12/09/16: 24/30 scored today for baseline    Time  8    Period  Weeks    Status  On-going      PT LONG TERM GOAL #4   Title  Pt will improve gait velocity to > or = 4.0 ft/sec for improved safety in community    Baseline  3.5    Time  8    Period  Weeks    Status  On-going      PT LONG TERM GOAL #5   Title  Pt will improve overall function (FOTO) to > or = 85%    Baseline  79%    Time  8    Period  Weeks    Status  On-going      PT LONG TERM GOAL #6   Title  Ambulate 1000' over  uneven outdoor surfaces/curbs with supervision and negotiate 2 stairs x 3 reps without rails, alternating sequence with supervision    Time  8    Period  Weeks    Status  On-going      PT LONG TERM GOAL #7   Title  Will report returning to 50% of baseline function/activities (household activities, carrying boxes from utility building, decorating for Christmas)    Time  8    Period  Weeks    Status  On-going            Plan -  12/12/16 1314    Clinical Impression Statement  Pt tolerated treatment well with no limitations due to pain or fatigue. Todays session focused on LE strengthening, balance and gait training. Pt would benefit from continued PT treatment session to progress towards goals.     Rehab Potential  Good    PT Frequency  2x / week    PT Duration  8 weeks    PT Treatment/Interventions  ADLs/Self Care Home Management;Gait training;Stair training;Functional mobility training;Therapeutic activities;Therapeutic exercise;Balance training;Neuromuscular re-education;Patient/family education;Energy conservation;Vestibular;Visual/perceptual remediation/compensation    PT Next Visit Plan  Continue with balance activities and gait on compliant surfaces.     Consulted and Agree with Plan of Care  Patient       Patient will benefit from skilled therapeutic intervention in order to improve the following deficits and impairments:  Decreased activity tolerance, Decreased balance, Decreased endurance, Decreased strength, Difficulty walking, Dizziness, Impaired UE functional use, Impaired vision/preception, Pain  Visit Diagnosis: Hemiplegia and hemiparesis following cerebral infarction affecting right dominant side (HCC)  Unsteadiness on feet  Other lack of coordination  Dizziness and giddiness  Difficulty in walking, not elsewhere classified     Problem List Patient Active Problem List   Diagnosis Date Noted  . CVA (cerebral vascular accident) (HCC) 10/25/2016  . 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 Essential hypertension 10/24/2016  . Diabetes mellitus with complication (HCC) 10/24/2016  . Chronic pain 10/24/2016   Azel Gumina, SPTA  Luverne Farone 12/12/2016, 2:16 PM  [facility] [address] [place], [place], [address] Phone: [phone]   Fax:  [phone]  Name: Andilynn Delavega MRN: [id number] Date of Birth: 10-Sep-1940

## 2016-12-12 NOTE — Patient Instructions (Addendum)
Hip Abduction (Standing)    Stand with support. Lift right leg out to side slowly, keeping toe forward. Hold for _3__ seconds.   Repeat _10__ times. Do _1-2__ times a day. Repeat with other leg.    Copyright  VHI. All rights reserved.  HIP / KNEE: Extension - Standing     Raise and lift leg backward slowly. Keep knee straight or slightly bent.  Complete__10_ reps per set, _1-2__ sets per day, Hold onto a counter/chair for support.  Copyright  VHI. All rights reserved.  Mini-Squats (Standing)    Stand with counter or chair for support. Bend knees slightly. Return to straight standing.  Repeat _10__ times. Do _1-2__ times a day.  Copyright  VHI. All rights reserved.  Heel Raises    Stand with support. With knees straight, raise heels off ground. Hold __3_ seconds.  Repeat _10__ times. Do _1-2__ times a day.  Copyright  VHI. All rights reserved.

## 2016-12-12 NOTE — Therapy (Signed)
[facility] [address] [place], [place], [address] Phone: [phone]   Fax:  [phone]  Occupational Therapy Treatment  Patient Details  Name: Jill NuttingCarol Howard MRN: [id number] Date of Birth: July 11, 1940 Referring Provider: Guido Sanderonna Jean King, [place]-C   Encounter Date: 12/12/2016  OT End of Session - 12/12/16 0926    Visit Number  3    Number of Visits  17    Date for OT Re-Evaluation  01/30/17    Authorization Type  Medicare/AARP; G-code needed    Authorization - Visit Number  3    Authorization - Number of Visits  10    OT Start Time  0935    OT Stop Time  1015    OT Time Calculation (min)  40 min    Activity Tolerance  Patient tolerated treatment well    Behavior During Therapy  [facility]WFL for tasks assessed/performed       Past Medical History:  Diagnosis Date  . Diabetes mellitus without complication (HCC)   . Hypertension   . Kidney stone   . Stroke (HCC)   . Vertigo     Past Surgical History:  Procedure Laterality Date  . ABDOMINAL HYSTERECTOMY    . KIDNEY STONE SURGERY      There were no vitals filed for this visit.  Subjective Assessment - 12/12/16 0925    Subjective   Pt reports that she was able to complete sewing project for cat, but is still frustrated with typing    Patient is accompained by:  Family member    Pertinent History  vertigo, fibromyalgia, DM, HTN, hyperlipidemia, ?vascular dementia     Limitations  fall risk, R homonymous hemianopsia    Patient Stated Goals  improve balance, improve strength, improve vision, would like to be able to drive again    Currently in Pain?  No/denies        Copying small peg design (complex) with mod difficulty/cueing for disorganized visual scanning.  Encouraged pt to try typing for short periods of time to decr frustration.                     OT Education - 12/12/16 1012    Education Details  Visual Compensation Strategies; Yellow  theraband HEP    Person(s) Educated  Patient    Methods  Explanation;Demonstration;Verbal cues;Handout    Comprehension  Verbalized understanding;Returned demonstration       OT Short Term Goals - 12/07/16 1256      OT SHORT TERM GOAL #1   Title  Pt will be independent with initial HEP for RUE strength.--check STGs 12/30/16    Status  On-going      OT SHORT TERM GOAL #2   Title  Pt will verbalize understanding of visual compensation strategies.    Status  On-going      OT SHORT TERM GOAL #3   Title  Pt will perform simple environmental scanning/navigation with at least 90% accuracy for incr safety for community activities.    Status  On-going      OT SHORT TERM GOAL #4   Title  Pt will perform complex tabletop visual scanning and reading with at least 95% accuracy.    Status  On-going        OT Long Term Goals - 12/07/16 1256      OT LONG TERM GOAL #1   Title  Pt will be independent with updated RUE strengthening HEP.--check LTGs 01/30/17  Status  On-going      OT LONG TERM GOAL #2   Title  Pt will perform environmental scanning with simple divided attention with at least 90% accuracy for improved safety for community activities.    Status  On-going      OT LONG TERM GOAL #3   Title  Pt will perform all previous home maintenance tasks independently.    Status  On-going      OT LONG TERM GOAL #4   Title  Pt will perform environmental scanning/navigation in busy environment with at least 90% accuracy for improved safety for community activities.    Status  On-going            Plan - 12/12/16 0926    Clinical Impression Statement  Pt is progressing towards goals.  Pt does demo incr difficutly with disorganized visual scanning but is able to verbalize compensation strategies.  However, pt is frustrated by changes from CVA.    Rehab Potential  Good    OT Frequency  2x / week    OT Duration  8 weeks    OT Treatment/Interventions  Self-care/ADL training;Therapeutic  exercise;Building services engineerunctional Mobility Training;Therapeutic activities;Patient/family education;Neuromuscular education;Therapeutic exercises;Energy conservation;Visual/perceptual remediation/compensation;Cognitive remediation/compensation;Balance training;Fluidtherapy;Cryotherapy;DME and/or AE instruction;Moist Heat;Manual Therapy    Plan  visual scanning, typing practice, review HEP    OT Home Exercise Plan  Education provided:  visual compensation strategies; yellow theraband HEP    Consulted and Agree with Plan of Care  Patient;Family member/caregiver       Patient will benefit from skilled therapeutic intervention in order to improve the following deficits and impairments:  Impaired vision/preception, Decreased mobility, Decreased balance, Decreased endurance, Decreased cognition, Decreased coordination, Decreased strength, Decreased activity tolerance  Visit Diagnosis: Hemiplegia and hemiparesis following cerebral infarction affecting right dominant side (HCC)  Unsteadiness on feet  Other lack of coordination  Visuospatial deficit  Other symptoms and signs involving cognitive functions following cerebral infarction  Attention and concentration deficit    Problem List Patient Active Problem List   Diagnosis Date Noted  . CVA (cerebral vascular accident) (HCC) 10/25/2016  . 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 Essential hypertension 10/24/2016  . Diabetes mellitus with complication (HCC) 10/24/2016  . Chronic pain 10/24/2016    [facility] CorporationFREEMAN,Jill Klenke 12/12/2016, 1:09 PM  [facility] [address] [place], [place], [address] Phone: [phone]   Fax:  [phone]  Name: Jill NuttingCarol Howard MRN: [id number] Date of Birth: 03-27-1940   Willa FraterAngela Thao Bauza, OTR/L [facility] [address] [place], [place]  [address] [phone] 12/12/16 1:09 PM

## 2016-12-13 ENCOUNTER — Other Ambulatory Visit: Payer: Self-pay

## 2016-12-13 ENCOUNTER — Encounter: Payer: Self-pay | Admitting: Speech Pathology

## 2016-12-13 ENCOUNTER — Encounter: Payer: Self-pay | Admitting: Occupational Therapy

## 2016-12-13 ENCOUNTER — Ambulatory Visit: Payer: Medicare Other | Admitting: Speech Pathology

## 2016-12-13 ENCOUNTER — Ambulatory Visit: Payer: Medicare Other | Admitting: Occupational Therapy

## 2016-12-13 DIAGNOSIS — I69318 Other symptoms and signs involving cognitive functions following cerebral infarction: Secondary | ICD-10-CM

## 2016-12-13 DIAGNOSIS — R41842 Visuospatial deficit: Secondary | ICD-10-CM

## 2016-12-13 DIAGNOSIS — R278 Other lack of coordination: Secondary | ICD-10-CM

## 2016-12-13 DIAGNOSIS — R2681 Unsteadiness on feet: Secondary | ICD-10-CM

## 2016-12-13 DIAGNOSIS — I69351 Hemiplegia and hemiparesis following cerebral infarction affecting right dominant side: Secondary | ICD-10-CM

## 2016-12-13 DIAGNOSIS — R4701 Aphasia: Secondary | ICD-10-CM

## 2016-12-13 DIAGNOSIS — R41840 Attention and concentration deficit: Secondary | ICD-10-CM

## 2016-12-13 NOTE — Therapy (Signed)
[facility] [address] [place], [place], [address] Phone: [phone]   Fax:  [phone]  Occupational Therapy Treatment  Patient Details  Name: Jill Howard MRN: [id number] Date of Birth: 09-10-1940 Referring Provider: Guido Sanderonna Jean [place], [place]-C   Encounter Date: 12/13/2016  OT End of Session - 12/13/16 1151    Visit Number  4    Number of Visits  17    Date for OT Re-Evaluation  01/30/17    Authorization Type  Medicare/AARP; G-code needed    Authorization - Visit Number  4    Authorization - Number of Visits  10    OT Start Time  1149    OT Stop Time  1230    OT Time Calculation (min)  41 min    Activity Tolerance  Patient tolerated treatment well    Behavior During Therapy  [facility]WFL for tasks assessed/performed       Past Medical History:  Diagnosis Date  . Diabetes mellitus without complication (HCC)   . Hypertension   . Kidney stone   . Stroke (HCC)   . Vertigo     Past Surgical History:  Procedure Laterality Date  . ABDOMINAL HYSTERECTOMY    . KIDNEY STONE SURGERY      There were no vitals filed for this visit.  Subjective Assessment - 12/13/16 1151    Subjective   Pt reports that she was in ER for 4 hrs yesterday with husband.    Patient is accompained by:  Family member    Pertinent History  vertigo, fibromyalgia, DM, HTN, hyperlipidemia, ?vascular dementia     Limitations  fall risk, R homonymous hemianopsia    Patient Stated Goals  improve balance, improve strength, improve vision, would like to be able to drive again    Currently in Pain?  No/denies       Completing 24-piece puzzle with mod cueing/difficulty for organized scanning pattern.    Typing games for improved coordination/typing and visual scanning.  Bubbles with good success and sore of 2010 and clouds with incr difficulty reacting quickly.    Encouraged pt to do crafts, word searches, and puzzles to work on visual scanning.   Pt/dtr verbalized understanding.                  OT Education - 12/13/16 1206    Education Details  Reviewed yellow theraband HEP    Person(s) Educated  Patient    Methods  Explanation;Demonstration    Comprehension  Verbalized understanding;Returned demonstration       OT Short Term Goals - 12/07/16 1256      OT SHORT TERM GOAL #1   Title  Pt will be independent with initial HEP for RUE strength.--check STGs 12/30/16    Status  On-going      OT SHORT TERM GOAL #2   Title  Pt will verbalize understanding of visual compensation strategies.    Status  On-going      OT SHORT TERM GOAL #3   Title  Pt will perform simple environmental scanning/navigation with at least 90% accuracy for incr safety for community activities.    Status  On-going      OT SHORT TERM GOAL #4   Title  Pt will perform complex tabletop visual scanning and reading with at least 95% accuracy.    Status  On-going        OT Long Term Goals - 12/07/16 1256      OT LONG TERM  GOAL #1   Title  Pt will be independent with updated RUE strengthening HEP.--check LTGs 01/30/17    Status  On-going      OT LONG TERM GOAL #2   Title  Pt will perform environmental scanning with simple divided attention with at least 90% accuracy for improved safety for community activities.    Status  On-going      OT LONG TERM GOAL #3   Title  Pt will perform all previous home maintenance tasks independently.    Status  On-going      OT LONG TERM GOAL #4   Title  Pt will perform environmental scanning/navigation in busy environment with at least 90% accuracy for improved safety for community activities.    Status  On-going            Plan - 12/13/16 1207    Clinical Impression Statement  Pt is progressing towards goals.  Pt demo incr difficulty reacting quickly to visual stimuli and with disorganized visual scanning and needs cueing to use strategies in these cases.      Rehab Potential  Good    OT Frequency   2x / week    OT Duration  8 weeks    OT Treatment/Interventions  Self-care/ADL training;Therapeutic exercise;Building services engineerunctional Mobility Training;Therapeutic activities;Patient/family education;Neuromuscular education;Therapeutic exercises;Energy conservation;Visual/perceptual remediation/compensation;Cognitive remediation/compensation;Balance training;Fluidtherapy;Cryotherapy;DME and/or AE instruction;Moist Heat;Manual Therapy    Plan  environmental scanning, review visual compensation strategies    OT Home Exercise Plan  Education provided:  visual compensation strategies; yellow theraband HEP    Consulted and Agree with Plan of Care  Patient;Family member/caregiver       Patient will benefit from skilled therapeutic intervention in order to improve the following deficits and impairments:  Impaired vision/preception, Decreased mobility, Decreased balance, Decreased endurance, Decreased cognition, Decreased coordination, Decreased strength, Decreased activity tolerance  Visit Diagnosis: Hemiplegia and hemiparesis following cerebral infarction affecting right dominant side (HCC)  Other lack of coordination  Unsteadiness on feet  Visuospatial deficit  Other symptoms and signs involving cognitive functions following cerebral infarction  Attention and concentration deficit    Problem List Patient Active Problem List   Diagnosis Date Noted  . CVA (cerebral vascular accident) (HCC) 10/25/2016  . 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 Essential hypertension 10/24/2016  . Diabetes mellitus with complication (HCC) 10/24/2016  . Chronic pain 10/24/2016    [facility]REEMAN,Prynce Jacober 12/13/2016, 12:47 PM  [facility] [address] [place], [place], [address] Phone: [phone]   Fax:  [phone]  Name: Jill Howard MRN: [id number] Date of Birth: [birth year]   Willa FraterAngela Lothar Prehn, OTR/L [facility] [address] [place], [place]  [address] [phone] phone [phone] 12/13/16 12:47 PM

## 2016-12-13 NOTE — Therapy (Signed)
[facility] [address] [place], [place], [address] Phone: [phone]   Fax:  [phone]  Speech Language Pathology Treatment  Patient Details  Name: Jill Howard MRN: [id number] Date of Birth: Jul 14, 1940 Referring Provider: Guido Sanderonna Jean King PA-C   Encounter Date: 12/13/2016  End of Session - 12/13/16 1218    Visit Number  2    Number of Visits  17    Date for SLP Re-Evaluation  02/03/17    SLP Start Time  1017    SLP Stop Time   1100    SLP Time Calculation (min)  43 min    Activity Tolerance  Patient tolerated treatment well       Past Medical History:  Diagnosis Date  . Diabetes mellitus without complication (HCC)   . Hypertension   . Kidney stone   . Stroke (HCC)   . Vertigo     Past Surgical History:  Procedure Laterality Date  . ABDOMINAL HYSTERECTOMY    . KIDNEY STONE SURGERY      There were no vitals filed for this visit.  Subjective Assessment - 12/13/16 1030    Subjective  "I have good days and bad days"    Patient is accompained by:  Family member daughter, Jill Howard            ADULT SLP TREATMENT - 12/13/16 1031      General Information   Behavior/Cognition  Alert;Cooperative;Pleasant mood;Agitated      Treatment Provided   Treatment provided  Cognitive-Linquistic      Pain Assessment   Pain Assessment  No/denies pain      Cognitive-Linquistic Treatment   Treatment focused on  Aphasia    Skilled Treatment  Pt named 6-7 items in simple category with usual min to mod A. Initiated training in compensations for dysnomia describing and gesturing simple objects/animals with occasional min questioning cues and verbal cues to generate salient, general descriptions. Daughter instructed on how to cue pt to help her utilize compensations for aphaisa. Reviewed pt's homework packet - aphasia errors in written sentences - pt to correct for homework      Assessment / Recommendations /  Plan   Plan  Continue with current plan of care      Progression Toward Goals   Progression toward goals  Progressing toward goals       SLP Education - 12/13/16 1214    Education provided  Yes    Education Details  appropriate cueing for pt to use compensations for aphasia (trained daughter, Jill Howard); language activities to do at home     Person(s) Educated  Patient;Child(ren)    Methods  Explanation;Demonstration;Verbal cues    Comprehension  Verbalized understanding;Returned demonstration;Verbal cues required;Need further instruction       SLP Short Term Goals - 12/13/16 1217      SLP SHORT TERM GOAL #1   Title  Pt will name 10 items for a simple category with occasional min A over 2 sessions    Time  4    Period  Weeks    Status  On-going      SLP SHORT TERM GOAL #2   Title  Pt will write at sentnece level, ID and correct her errors with occasional min A over 2 sessions    Time  4    Period  Weeks    Status  On-going      SLP SHORT TERM GOAL #3   Title  Pt will utilize compensations  for aphasia in structured language tasks with occasional min cues over 3 sessions    Time  4    Period  Weeks    Status  On-going      SLP SHORT TERM GOAL #4   Title  Cognitive linguistic eval to be completed, goals added if indicated    Time  2    Period  Weeks    Status  On-going       SLP Long Term Goals - 12/13/16 1218      SLP LONG TERM GOAL #1   Title  Pt will name 8 items in a mildly complex category with occasional min A over 2 sessions    Time  8    Period  Weeks    Status  On-going      SLP LONG TERM GOAL #2   Title  Pt will write 2-3 sentnece paragraph, correcting her errors, with occasional min A over 2 sessions    Time  8    Period  Weeks    Status  On-going      SLP LONG TERM GOAL #3   Title  Pt will utilize compensations for aphasia over 12 minute milldy complex conversation with 2 or less requests for repair/clarification by ST over 3 sessions    Time  8     Period  Weeks    Status  On-going       Plan - 12/13/16 1215    Clinical Impression Statement  Pt continues to demonstrate significant aphasia in verbal and written expression. 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 Initiated training in UnumProvidentcompnesations for apahsia. Daughter present and verbalized understanding. Continue skilld ST to maximize communication for improved independence and QOL.    Speech Therapy Frequency  2x / week    Treatment/Interventions  Compensatory strategies;Patient/family education;Functional tasks;Cueing hierarchy;Environmental controls;Cognitive reorganization;Multimodal communcation approach;Language facilitation;Internal/external aids;SLP instruction and feedback    Potential to Achieve Goals  Good    Potential Considerations  Ability to learn/carryover information    Consulted and Agree with Plan of Care  Patient;Family member/caregiver    Family Member Consulted  daugher, Jill Howard       Patient will benefit from skilled therapeutic intervention in order to improve the following deficits and impairments:   Aphasia    Problem List Patient Active Problem List   Diagnosis Date Noted  . CVA (cerebral vascular accident) (HCC) 10/25/2016  . 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 Essential hypertension 10/24/2016  . Diabetes mellitus with complication (HCC) 10/24/2016  . Chronic pain 10/24/2016    Jill Howard, Radene JourneyLaura Ann MS, CCC-SLP 12/13/2016, 12:19 PM  [facility] [address] [place], [place], [address] Phone: [phone]   Fax:  [phone]   Name: Jill Howard MRN: [id number] Date of Birth: [birth year]

## 2016-12-14 ENCOUNTER — Ambulatory Visit: Payer: Medicare Other | Admitting: Neurology

## 2016-12-19 ENCOUNTER — Ambulatory Visit: Payer: Medicare Other | Admitting: Physical Therapy

## 2016-12-19 DIAGNOSIS — I69318 Other symptoms and signs involving cognitive functions following cerebral infarction: Secondary | ICD-10-CM

## 2016-12-19 DIAGNOSIS — R262 Difficulty in walking, not elsewhere classified: Secondary | ICD-10-CM

## 2016-12-19 DIAGNOSIS — I69351 Hemiplegia and hemiparesis following cerebral infarction affecting right dominant side: Secondary | ICD-10-CM

## 2016-12-19 DIAGNOSIS — R42 Dizziness and giddiness: Secondary | ICD-10-CM

## 2016-12-19 DIAGNOSIS — R2681 Unsteadiness on feet: Secondary | ICD-10-CM

## 2016-12-19 NOTE — Therapy (Signed)
[facility] [address] [place], [place], [address] Phone: [phone]   Fax:  [phone]  Physical Therapy Treatment  Patient Details  Name: Jill Howard MRN: [id number] Date of Birth: [birth year] Referring Provider: Guido Sander, [place]   Encounter Date: 12/19/2016  PT End of Session - 12/19/16 1531    Visit Number  4    Number of Visits  17    Date for PT Re-Evaluation  01/30/17    Authorization Type  Medicare/AARP G code and PN every 10th visit    PT Start Time  1448    PT Stop Time  1532    PT Time Calculation (min)  44 min    Equipment Utilized During Treatment  Gait belt    Activity Tolerance  Patient tolerated treatment well    Behavior During Therapy  [facility] for tasks assessed/performed       Past Medical History:  Diagnosis Date  . Diabetes mellitus without complication (HCC)   . Hypertension   . Kidney stone   . Stroke (HCC)   . Vertigo     Past Surgical History:  Procedure Laterality Date  . ABDOMINAL HYSTERECTOMY    . KIDNEY STONE SURGERY      There were no vitals filed for this visit.  Subjective Assessment - 12/19/16 1446    Subjective  Pt states pain began on left medial knee two days ago. Pt is wondering if it was caused by her cat lying on her. Pain was 10/10 two days ago (Saturday), today at session at 4/10. Pt states she has not performed HEP since Saturday.    Pertinent History  bilat knee OA, vertigo, diabetes, TIA, HTN, hyperlipidemia, fibromyalgia, vascular dementia, and migraines    Limitations  Standing;Walking    Patient Stated Goals  To improve balance to prevent a fall and gain more strength in her hand    Currently in Pain?  Yes    Pain Score  4     Pain Location  Knee    Pain Orientation  Left    Pain Type  Acute pain    Aggravating Factors   Going down stairs        [facility] Adult PT Treatment/Exercise - 12/19/16 0001      Neuro Re-ed    Neuro Re-ed Details   Pt  performed corner balance exercises on level surface with wide BOS then narrow BOS; EO then EC; with head turns, head nods, and diagonal head movements. Pt was challenged and needed Min Assist with all balance activities involving EC, and most activities with feet together.      Pt performed balance activities in // bars using blue foam beam. Pt was given Min Assist for EC, feet together dynamic balance activities with no UE support. Pt performed side stepping and tandem step forward and backward on beam with single UE support and Min guard to [employer] for safety and balance correction.   Pt performed stepping strategies of step off then back on alternating RLE then LLE forward, then RLE and LLE backwards. Pt found stepping backwards to be less challenging. Min Guard to [employer] provided for safety.     Pt performed toe taps at counter on 3" bottom shelf, alternating x10 each side with Min guard for safety; then toe taps to 4' foam blocks in diagonal alternating pattern x10 each side with Min guard; SLR x3 each side for 10-sec. hold with min Assist.  Pt performed UE  balance by putting cones into top shelves in cabinet. Pt was not challenged with this activity, would recommend trying on compliant surface next time to increase challenge.      Exercises   Exercises  Knee/Hip      Knee/Hip Exercises: Standing   Hip Abduction  AROM;Stengthening;Both;1 set;10 reps;Knee straight    Abduction Limitations  Pt in front of counter for UE support as needed, cues for posture and to avoid lateral leaning.     Hip Extension  AROM;Stengthening;Both;1 set;10 reps;Knee straight    Extension Limitations  Pt in front of counter for UE support as needed. Pt required cues for proper posture during exercise and to avoid forward lean.         PT Short Term Goals - 12/10/16 0025      PT SHORT TERM GOAL #1   Title  Pt will participate in further balance and endurance testing - 6 min walk, FGA    Baseline  see LTGs     Status  Achieved      PT SHORT TERM GOAL #2   Title  Pt will participate in further vestibular assessment     Time  4    Period  Weeks    Status  On-going      PT SHORT TERM GOAL #3   Title  Pt will improve gait velocity to > or = 3.8 ft/sec    Baseline  3.5 ft/sec    Time  4    Period  Weeks    Status  On-going      PT SHORT TERM GOAL #4   Title  Will negotiate 2 stairs x 3 reps, no rails, alternating sequence with min A    Baseline  step to, min A    Time  4    Period  Weeks    Status  On-going        PT Long Term Goals - 12/10/16 0026      PT LONG TERM GOAL #1   Title  Pt will perform balance and strengthening HEP with husband's supervision    Time  8    Period  Weeks    Status  On-going      PT LONG TERM GOAL #2   Title  Pt will improve 6 min walk test distance by 100'     Baseline  12/09/16: 1041 no AD at baseline with no rest breaks taken    Time  8    Period  Weeks    Status  On-going      PT LONG TERM GOAL #3   Title  Pt will improve FGA score by 8 points from initial assessment    Baseline  12/09/16: 24/30 scored today for baseline    Time  8    Period  Weeks    Status  On-going      PT LONG TERM GOAL #4   Title  Pt will improve gait velocity to > or = 4.0 ft/sec for improved safety in community    Baseline  3.5    Time  8    Period  Weeks    Status  On-going      PT LONG TERM GOAL #5   Title  Pt will improve overall function (FOTO) to > or = 85%    Baseline  79%    Time  8    Period  Weeks    Status  On-going      PT LONG  TERM GOAL #6   Title  Ambulate 1000' over uneven outdoor surfaces/curbs with supervision and negotiate 2 stairs x 3 reps without rails, alternating sequence with supervision    Time  8    Period  Weeks    Status  On-going      PT LONG TERM GOAL #7   Title  Will report returning to 50% of baseline function/activities (household activities, carrying boxes from utility building, decorating for Christmas)    Time  8     Period  Weeks    Status  On-going       Plan - 12/19/16 1604    Clinical Impression Statement  Pt is challenged with most balance activities involving feet together and/or EC. Pt tolerated treatment well and will continue to benefit from further sessions to progress toward goals.    PT Frequency  2x / week    PT Duration  8 weeks    PT Treatment/Interventions  ADLs/Self Care Home Management;Gait training;Stair training;Functional mobility training;Therapeutic activities;Therapeutic exercise;Balance training;Neuromuscular re-education;Patient/family education;Energy conservation;Vestibular;Visual/perceptual remediation/compensation    PT Next Visit Plan  Continue with balance activities and gait on compliant surfaces.     Consulted and Agree with Plan of Care  Patient       Patient will benefit from skilled therapeutic intervention in order to improve the following deficits and impairments:  Decreased activity tolerance, Decreased balance, Decreased endurance, Decreased strength, Difficulty walking, Dizziness, Impaired UE functional use, Impaired vision/preception, Pain  Visit Diagnosis: Hemiplegia and hemiparesis following cerebral infarction affecting right dominant side (HCC)  Unsteadiness on feet  Other symptoms and signs involving cognitive functions following cerebral infarction  Dizziness and giddiness  Difficulty in walking, not elsewhere classified     Problem List Patient Active Problem List   Diagnosis Date Noted  . CVA (cerebral vascular accident) (HCC) 10/25/2016  . 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 Essential hypertension 10/24/2016  . Diabetes mellitus with complication (HCC) 10/24/2016  . Chronic pain 10/24/2016    Kipp LaurenceKatherine Sage Hammill, SPTA 12/19/2016, 5:33 PM  [facility] [address] [place], [place], [address] Phone: [phone]   Fax:  [phone]  Name: Nilsa NuttingCarol Dillie MRN: [id number] Date  of Birth: 10-23-1940

## 2016-12-20 ENCOUNTER — Ambulatory Visit: Payer: Medicare Other | Admitting: Occupational Therapy

## 2016-12-20 ENCOUNTER — Ambulatory Visit: Payer: Medicare Other | Admitting: *Deleted

## 2016-12-20 DIAGNOSIS — I69351 Hemiplegia and hemiparesis following cerebral infarction affecting right dominant side: Secondary | ICD-10-CM | POA: Diagnosis not present

## 2016-12-20 DIAGNOSIS — R2681 Unsteadiness on feet: Secondary | ICD-10-CM

## 2016-12-20 DIAGNOSIS — I69318 Other symptoms and signs involving cognitive functions following cerebral infarction: Secondary | ICD-10-CM

## 2016-12-20 DIAGNOSIS — R278 Other lack of coordination: Secondary | ICD-10-CM

## 2016-12-20 DIAGNOSIS — R4701 Aphasia: Secondary | ICD-10-CM

## 2016-12-20 DIAGNOSIS — R41841 Cognitive communication deficit: Secondary | ICD-10-CM

## 2016-12-20 DIAGNOSIS — R41842 Visuospatial deficit: Secondary | ICD-10-CM

## 2016-12-20 DIAGNOSIS — R41840 Attention and concentration deficit: Secondary | ICD-10-CM

## 2016-12-20 NOTE — Therapy (Signed)
[facility] [address] [place], [place], [address] Phone: [phone]   Fax:  [phone]  Speech Language Pathology Treatment  Patient Details  Name: Jill NuttingCarol Howard MRN: [id number] Date of Birth: 04-05-1940 Referring Provider: Guido Sanderonna Jean King PA-C   Encounter Date: 12/20/2016  End of Session - 12/20/16 1230    Visit Number  3    Number of Visits  17    Date for SLP Re-Evaluation  02/03/17    SLP Start Time  1150    SLP Stop Time   1235    SLP Time Calculation (min)  45 min    Activity Tolerance  Patient tolerated treatment well       Past Medical History:  Diagnosis Date  . Diabetes mellitus without complication (HCC)   . Hypertension   . Kidney stone   . Stroke (HCC)   . Vertigo     Past Surgical History:  Procedure Laterality Date  . ABDOMINAL HYSTERECTOMY    . KIDNEY STONE SURGERY      There were no vitals filed for this visit.  Subjective Assessment - 12/20/16 1152    Subjective  I'm a little tired - we've been going since 5:00 this morning.  I'm not as strong as I used to be.    Currently in Pain?  No/denies            ADULT SLP TREATMENT - 12/20/16 0001      General Information   Behavior/Cognition  Alert;Cooperative;Pleasant mood      Treatment Provided   Treatment provided  Cognitive-Linquistic      Pain Assessment   Pain Assessment  No/denies pain      Cognitive-Linquistic Treatment   Treatment focused on  Aphasia;Patient/family/caregiver education    Skilled Treatment  SLP facilitated skilled ST session, focusing on goals established following initial evaluation. Pt was noted to be very tangential and difficult to redirect back to task, however, few instances of word finding or paraphasias were observed during the session. 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 Pt required mod cues to produce a sentence given 2 words, and was encouraged to continue working on sentences until next session.     Assessment / Recommendations / Plan   Plan  Continue with current plan of care      Progression Toward Goals   Progression toward goals  Progressing toward goals       SLP Education - 12/20/16 1221    Education provided  Yes    Education Details  reviewed strategies for word finding, reminders to slow down and recheck work    Starwood HotelsPerson(s) Educated  Patient    Methods  Explanation;Verbal cues    Comprehension  Verbalized understanding       SLP Short Term Goals - 12/20/16 1238      SLP SHORT TERM GOAL #1   Title  Pt will name 10 items for a simple category with occasional min A over 2 sessions    Time  3    Period  Weeks    Status  On-going      SLP SHORT TERM GOAL #2   Title  Pt will write at sentnece level, ID and correct her errors with occasional min A over 2 sessions    Time  3    Period  Weeks  Status  On-going      SLP SHORT TERM GOAL #3   Title  Pt will utilize compensations for aphasia in structured language tasks with occasional min cues over 3 sessions    Time  3    Period  Weeks    Status  On-going      SLP SHORT TERM GOAL #4   Title  Cognitive linguistic eval to be completed, goals added if indicated    Time  3    Period  Weeks    Status  On-going       SLP Long Term Goals - 12/20/16 1238      SLP LONG TERM GOAL #1   Title  Pt will name 8 items in a mildly complex category with occasional min A over 2 sessions    Time  7    Period  Weeks    Status  On-going      SLP LONG TERM GOAL #2   Title  Pt will write 2-3 sentnece paragraph, correcting her errors, with occasional min A over 2 sessions    Time  7    Period  Weeks    Status  On-going      SLP LONG TERM GOAL #3   Title  Pt will utilize compensations for aphasia over 12 minute milldy complex conversation with 2 or less requests for  repair/clarification by ST over 3 sessions    Time  7    Period  Weeks    Status  On-going       Plan - 12/20/16 1235    Clinical Impression Statement  Pt appears to have made significant improvement in verbal expression. SLP noted 3 paraphasic errors during extended conversation. Continued ST intervention is recommended to maximize effective communication for improved QOL.    Speech Therapy Frequency  2x / week    Duration  -- 8 weeks16 visits    Treatment/Interventions  Compensatory strategies;Patient/family education;Functional tasks;Cueing hierarchy;Environmental controls;Cognitive reorganization;Multimodal communcation approach;Language facilitation;Internal/external aids;SLP instruction and feedback    Potential to Achieve Goals  Good    Potential Considerations  Ability to learn/carryover information    SLP Home Exercise Plan  continue worksheets    Consulted and Agree with Plan of Care  Patient       Patient will benefit from skilled therapeutic intervention in order to improve the following deficits and impairments:   Aphasia  Cognitive communication deficit    Problem List Patient Active Problem List   Diagnosis Date Noted  . CVA (cerebral vascular accident) (HCC) 10/25/2016  . 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 Essential hypertension 10/24/2016  . Diabetes mellitus with complication (HCC) 10/24/2016  . Chronic pain 10/24/2016   Jill Howard Natal [facility], CCC-SLP Speech Language Pathologist  Leigh Aurora 12/20/2016, 12:39 PM  [facility] [address] [place], [place], [address] Phone: [phone]   Fax:  [phone]   Name: Jill Howard MRN: [id number] Date of Birth: 11-20-1940

## 2016-12-20 NOTE — Therapy (Signed)
[facility] [address] [place], [place], [address] Phone: [phone]   Fax:  [phone]  Occupational Therapy Treatment  Patient Details  Name: Jill Howard MRN: [id number] Date of Birth: [birth year] Referring Provider: Guido Sanderonna Jean King, [place]-C   Encounter Date: 12/20/2016  OT End of Session - 12/20/16 1109    Visit Number  5    Number of Visits  17    Date for OT Re-Evaluation  01/30/17    Authorization Type  Medicare/AARP; G-code needed    Authorization - Visit Number  5    Authorization - Number of Visits  10    OT Start Time  1106    OT Stop Time  1147    OT Time Calculation (min)  41 min    Activity Tolerance  Patient tolerated treatment well    Behavior During Therapy  [facility] for tasks assessed/performed       Past Medical History:  Diagnosis Date  . Diabetes mellitus without complication (HCC)   . Hypertension   . Kidney stone   . Stroke (HCC)   . Vertigo     Past Surgical History:  Procedure Laterality Date  . ABDOMINAL HYSTERECTOMY    . KIDNEY STONE SURGERY      There were no vitals filed for this visit.  Subjective Assessment - 12/20/16 1106    Subjective   Pt reports decorating new tree and had a little trouble getting hook on ornament and placing items where she intended on tree (husband helped).   Pt reports that she does not have a lot of patience for herself.  Pt reports dizziness with visually demanding tasks.    Patient is accompained by:  Family member    Pertinent History  vertigo, fibromyalgia, DM, HTN, hyperlipidemia, ?vascular dementia     Limitations  fall risk, R homonymous hemianopsia    Patient Stated Goals  improve balance, improve strength, improve vision, would like to be able to drive again    Currently in Pain?  No/denies       Discussed how ADLs/IADLs are going at home and continued challenges.   Arm bike x625min level 1 for conditioning without rest  (forward/backwards).             OT Treatments/Exercises (OP) - 12/20/16 0001      Visual/Perceptual Exercises   Copy this Image  Pegboard;PVC    Pegboard  placing small pegs in pegboard with R hand for incr coordination.  Pt copied with good accuracy and during conversation for divided attention.    PVC  Copied design x2 for cognitive/problem-solving component with good accuracy, self-correction (double-checked herself without cues).       Environmental scanning in min distracting environment with 100% accuracy.         OT Education - 12/20/16 1114    Education Details  Reviewed visual compensation strategies     Person(s) Educated  Patient    Methods  Explanation    Comprehension  Verbalized understanding       OT Short Term Goals - 12/20/16 1146      OT SHORT TERM GOAL #1   Title  Pt will be independent with initial HEP for RUE strength.--check STGs 12/30/16    Status  On-going      OT SHORT TERM GOAL #2   Title  Pt will verbalize understanding of visual compensation strategies.    Status  On-going      OT SHORT TERM GOAL #3  Title  Pt will perform simple environmental scanning/navigation with at least 90% accuracy for incr safety for community activities.    Status  Achieved 12/20/16  100% accuracy      OT SHORT TERM GOAL #4   Title  Pt will perform complex tabletop visual scanning and reading with at least 95% accuracy.    Status  On-going        OT Long Term Goals - 12/07/16 1256      OT LONG TERM GOAL #1   Title  Pt will be independent with updated RUE strengthening HEP.--check LTGs 01/30/17    Status  On-going      OT LONG TERM GOAL #2   Title  Pt will perform environmental scanning with simple divided attention with at least 90% accuracy for improved safety for community activities.    Status  On-going      OT LONG TERM GOAL #3   Title  Pt will perform all previous home maintenance tasks independently.    Status  On-going      OT LONG TERM  GOAL #4   Title  Pt will perform environmental scanning/navigation in busy environment with at least 90% accuracy for improved safety for community activities.    Status  On-going            Plan - 12/20/16 1110    Clinical Impression Statement  Pt demo improved ability to compensate for visual deficits at complex tabletop level.  Pt is progressing towards goals.    Rehab Potential  Good    OT Frequency  2x / week    OT Duration  8 weeks    OT Treatment/Interventions  Self-care/ADL training;Therapeutic exercise;Building services engineerunctional Mobility Training;Therapeutic activities;Patient/family education;Neuromuscular education;Therapeutic exercises;Energy conservation;Visual/perceptual remediation/compensation;Cognitive remediation/compensation;Balance training;Fluidtherapy;Cryotherapy;DME and/or AE instruction;Moist Heat;Manual Therapy    Plan  environmental scanning in busy environment with reinforce visual compensation strategies; review theraband HEP    OT Home Exercise Plan  Education provided:  visual compensation strategies; yellow theraband HEP    Consulted and Agree with Plan of Care  Patient;Family member/caregiver       Patient will benefit from skilled therapeutic intervention in order to improve the following deficits and impairments:  Impaired vision/preception, Decreased mobility, Decreased balance, Decreased endurance, Decreased cognition, Decreased coordination, Decreased strength, Decreased activity tolerance  Visit Diagnosis: Visuospatial deficit  Hemiplegia and hemiparesis following cerebral infarction affecting right dominant side (HCC)  Attention and concentration deficit  Unsteadiness on feet  Other symptoms and signs involving cognitive functions following cerebral infarction  Other lack of coordination    Problem List Patient Active Problem List   Diagnosis Date Noted  . CVA (cerebral vascular accident) (HCC) 10/25/2016  . 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  Essential hypertension 10/24/2016  . Diabetes mellitus with complication (HCC) 10/24/2016  . Chronic pain 10/24/2016    [facility] For The AdirondacksFREEMAN,ANGELA 12/20/2016, 11:59 AM  [place] [facility] [address] [place], [place], [address] Phone: [phone]   Fax:  [phone]  Name: Jill Howard MRN: [id number] Date of Birth: December 06, 1940   Willa FraterAngela Freeman, OTR/L [facility] [address] [place], [place]  [address] [phone] phone [phone] 12/20/16 11:59 AM

## 2016-12-22 ENCOUNTER — Ambulatory Visit: Payer: Medicare Other | Admitting: Occupational Therapy

## 2016-12-22 ENCOUNTER — Encounter: Payer: Self-pay | Admitting: Occupational Therapy

## 2016-12-22 DIAGNOSIS — I69351 Hemiplegia and hemiparesis following cerebral infarction affecting right dominant side: Secondary | ICD-10-CM

## 2016-12-22 DIAGNOSIS — R278 Other lack of coordination: Secondary | ICD-10-CM

## 2016-12-22 DIAGNOSIS — I69318 Other symptoms and signs involving cognitive functions following cerebral infarction: Secondary | ICD-10-CM

## 2016-12-22 DIAGNOSIS — R41840 Attention and concentration deficit: Secondary | ICD-10-CM

## 2016-12-22 DIAGNOSIS — R41842 Visuospatial deficit: Secondary | ICD-10-CM

## 2016-12-22 DIAGNOSIS — R2681 Unsteadiness on feet: Secondary | ICD-10-CM

## 2016-12-22 NOTE — Therapy (Signed)
[facility] [address] [place], [place], [address] Phone: [phone]   Fax:  [phone]  Occupational Therapy Treatment  Patient Details  Name: Jill Howard MRN: [id number] Date of Birth: March 30, 1940 Referring Provider: Guido Sanderonna Jean [place], [place]   Encounter Date: 12/22/2016  OT End of Session - 12/22/16 1548    Visit Number  6    Number of Visits  17    Date for OT Re-Evaluation  01/30/17    Authorization Type  Medicare/AARP; G-code needed    Authorization - Visit Number  6    Authorization - Number of Visits  10    OT Start Time  1455    OT Stop Time  1540    OT Time Calculation (min)  45 min    Activity Tolerance  Patient tolerated treatment well    Behavior During Therapy  [facility] for tasks assessed/performed       Past Medical History:  Diagnosis Date  . Diabetes mellitus without complication (HCC)   . Hypertension   . Kidney stone   . Stroke (HCC)   . Vertigo     Past Surgical History:  Procedure Laterality Date  . ABDOMINAL HYSTERECTOMY    . KIDNEY STONE SURGERY      There were no vitals filed for this visit.  Subjective Assessment - 12/22/16 1455    Subjective   knees and L thumb are bothering me today.  (pt wearing thumb splint--had old injury)    Patient is accompained by:  Family member    Pertinent History  vertigo, fibromyalgia, DM, HTN, hyperlipidemia, ?vascular dementia     Limitations  fall risk, R homonymous hemianopsia    Patient Stated Goals  improve balance, improve strength, improve vision, would like to be able to drive again    Currently in Pain?  Yes    Pain Score  3     Pain Location  Knee bilateral knees, L thumb    Pain Descriptors / Indicators  Aching;Discomfort    Pain Type  Chronic pain    Pain Onset  More than a month ago    Pain Frequency  Intermittent    Aggravating Factors   use, cold weather    Pain Relieving Factors  thumb brace, rest         Enironmental  scanning in busy environment with 12/15 items located initially, min cueing to look to both sides (1 missed on R and 2 on L due to looking only to the R initially.  Then remaining items found on 2nd pass with min v.c. For 1.  Complex visual activity (100-piece puzzle of [place]) with min cueing/A to separate edge pieces from non-edge pieces.  Then completed edges and part of middle (did not complete due to time constraints) with min cueing.                     OT Education - 12/22/16 1548    Education Details  Reviewed yellow theraband HEP--pt returned demo each x15    Person(s) Educated  Patient    Methods  Explanation;Demonstration    Comprehension  Verbalized understanding;Verbal cues required       OT Short Term Goals - 12/20/16 1146      OT SHORT TERM GOAL #1   Title  Pt will be independent with initial HEP for RUE strength.--check STGs 12/30/16    Status  On-going      OT SHORT TERM GOAL #2  Title  Pt will verbalize understanding of visual compensation strategies.    Status  On-going      OT SHORT TERM GOAL #3   Title  Pt will perform simple environmental scanning/navigation with at least 90% accuracy for incr safety for community activities.    Status  Achieved 12/20/16  100% accuracy      OT SHORT TERM GOAL #4   Title  Pt will perform complex tabletop visual scanning and reading with at least 95% accuracy.    Status  On-going        OT Long Term Goals - 12/07/16 1256      OT LONG TERM GOAL #1   Title  Pt will be independent with updated RUE strengthening HEP.--check LTGs 01/30/17    Status  On-going      OT LONG TERM GOAL #2   Title  Pt will perform environmental scanning with simple divided attention with at least 90% accuracy for improved safety for community activities.    Status  On-going      OT LONG TERM GOAL #3   Title  Pt will perform all previous home maintenance tasks independently.    Status  On-going      OT LONG TERM GOAL #4   Title  Pt  will perform environmental scanning/navigation in busy environment with at least 90% accuracy for improved safety for community activities.    Status  On-going            Plan - 12/22/16 1459    Clinical Impression Statement  Pt is progressing towards goals with improving environmental scanning.    Rehab Potential  Good    OT Frequency  2x / week    OT Duration  8 weeks    OT Treatment/Interventions  Self-care/ADL training;Therapeutic exercise;Building services engineerunctional Mobility Training;Therapeutic activities;Patient/family education;Neuromuscular education;Therapeutic exercises;Energy conservation;Visual/perceptual remediation/compensation;Cognitive remediation/compensation;Balance training;Fluidtherapy;Cryotherapy;DME and/or AE instruction;Moist Heat;Manual Therapy    Plan  environmental scanning in busy environment, environmental scanning with divided attention, continue with RUE strength    OT Home Exercise Plan  Education provided:  visual compensation strategies; yellow theraband HEP    Consulted and Agree with Plan of Care  Patient;Family member/caregiver       Patient will benefit from skilled therapeutic intervention in order to improve the following deficits and impairments:  Impaired vision/preception, Decreased mobility, Decreased balance, Decreased endurance, Decreased cognition, Decreased coordination, Decreased strength, Decreased activity tolerance  Visit Diagnosis: Attention and concentration deficit  Visuospatial deficit  Other lack of coordination  Other symptoms and signs involving cognitive functions following cerebral infarction  Unsteadiness on feet  Hemiplegia and hemiparesis following cerebral infarction affecting right dominant side [facility](HCC)    Problem List Patient Active Problem List   Diagnosis Date Noted  . CVA (cerebral vascular accident) (HCC) 10/25/2016  . 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 Essential hypertension 10/24/2016  . Diabetes mellitus with  complication (HCC) 10/24/2016  . Chronic pain 10/24/2016    [facility] Center[facility]REEMAN,Alleen Kehm 12/22/2016, 3:53 PM  [facility] [address] [place], [place], [address] Phone: [phone]   Fax:  [phone]  Name: Jill Howard MRN: [id number] Date of Birth: 02-24-1940   Willa FraterAngela Mary-Ann Pennella, OTR/L [facility] [address] [place], [place]  [address] [phone] phone [phone] 12/22/16 3:53 PM

## 2016-12-23 ENCOUNTER — Encounter: Payer: Self-pay | Admitting: Physical Therapy

## 2016-12-23 ENCOUNTER — Ambulatory Visit: Payer: Medicare Other | Admitting: Physical Therapy

## 2016-12-23 DIAGNOSIS — R278 Other lack of coordination: Secondary | ICD-10-CM

## 2016-12-23 DIAGNOSIS — I69351 Hemiplegia and hemiparesis following cerebral infarction affecting right dominant side: Secondary | ICD-10-CM | POA: Diagnosis not present

## 2016-12-23 DIAGNOSIS — R262 Difficulty in walking, not elsewhere classified: Secondary | ICD-10-CM

## 2016-12-23 DIAGNOSIS — R42 Dizziness and giddiness: Secondary | ICD-10-CM

## 2016-12-23 DIAGNOSIS — R2681 Unsteadiness on feet: Secondary | ICD-10-CM

## 2016-12-23 NOTE — Therapy (Signed)
[place] [address] [place], [place], [address] Phone: [phone]   Fax:  [phone]  Physical Therapy Treatment  Patient Details  Name: Jill Howard MRN: [id number] Date of Birth: [birth year] Referring Provider: Lavella Hammock, [place]   Encounter Date: 12/23/2016  PT End of Session - 12/23/16 1557    Visit Number  5    Number of Visits  17    Date for PT Re-Evaluation  01/30/17    Authorization Type  Medicare/AARP G code and PN every 10th visit    PT Start Time  1320    PT Stop Time  1400    PT Time Calculation (min)  40 min    Equipment Utilized During Treatment  --    Activity Tolerance  Patient tolerated treatment well    Behavior During Therapy  [facility] for tasks assessed/performed       Past Medical History:  Diagnosis Date  . Diabetes mellitus without complication ([place])   . Hypertension   . Kidney stone   . Stroke ([place])   . Vertigo     Past Surgical History:  Procedure Laterality Date  . ABDOMINAL HYSTERECTOMY    . KIDNEY STONE SURGERY      There were no vitals filed for this visit.  Subjective Assessment - 12/23/16 1324    Subjective  Pt reports knee pain is better; took Motrin for pain relief and has also used heat or ice.  Has decorated 2 trees and is going to help with the church trees; will be standing on the ground reaching up and husband will use ladder to reach higher portions of the tree.  Looking up still makes her dizzy.    Pertinent History  bilat knee OA, vertigo, diabetes, TIA, HTN, hyperlipidemia, fibromyalgia, vascular dementia, and migraines    Limitations  Standing;Walking    Patient Stated Goals  To improve balance to prevent a fall and gain more strength in her hand    Currently in Pain?  No/denies       Vestibular Assessment - 12/23/16 1329      Vestibular Assessment   General Observation  reports dizziness when looking up since 2012 -severe attack of vertigo in 2012       Symptom Behavior   Type of Dizziness  Imbalance    Frequency of Dizziness  intermittent    Duration of Dizziness  seconds    Aggravating Factors  Looking up to the ceiling;Forward bending    Relieving Factors  Head stationary      Occulomotor Exam   Occulomotor Alignment  Normal    Spontaneous  Absent    Gaze-induced  Absent    Smooth Pursuits  Saccades    Saccades  Poor trajectory;Slow poor trajectory to R side    Comment  Convergence impaired      Vestibulo-Occular Reflex   VOR 1 Head Only (x 1 viewing)  impaired    VOR to Slow Head Movement  Positive right    Comment  HIT: + to R      Visual Acuity   Static  unable to perform due to visual deficits      Positional Testing   Sidelying Test  Sidelying Right;Sidelying Left      Sidelying Right   Sidelying Right Duration  0    Sidelying Right Symptoms  Other (comment);No nystagmus dizziness when coming upright from sidelying      Sidelying Left   Sidelying Left Duration  0  Sidelying Left Symptoms  No nystagmus;Other (comment) dizziness when coming to side upright from sidelying      Orthostatics   BP sitting  111/67    HR sitting  79       [facility] Adult PT Treatment/Exercise - 12/23/16 0001      Neuro Re-ed    Neuro Re-ed Details   Pt performed corner balance exercises on level surface with narrow BOS, then stagger stance; EO then EC; with head turns, head nods, and diagonal head movements. Pt was challenged and needed Min Guard/Min Assist with all balance activities involving EC, and most activities with feet together.    Pt performed hand reach strategies while maintaining balane with min guard given.      Vestibular Treatment/Exercise - 12/23/16 1349      Vestibular Treatment/Exercise   Vestibular Treatment Provided  Gaze    Gaze Exercises  X1 Viewing Horizontal;X1 Viewing Vertical      X1 Viewing Horizontal   Foot Position  seated and then standing    Comments  10-20 seconds; no symptoms in sitting;  imbalance and dizziness reported in standing      X1 Viewing Vertical   Foot Position  seated and then standing    Comments  10-20 seconds; no symptoms in sitting; imbalance and dizziness reported in standing       PT Short Term Goals - 12/23/16 2250      PT SHORT TERM GOAL #1   Title  Pt will participate in further balance and endurance testing - 6 min walk, FGA    Baseline  see LTGs    Status  Achieved    Target Date  12/31/16      PT SHORT TERM GOAL #2   Title  Pt will participate in further vestibular assessment     Baseline  met 11/30    Time  4    Period  Weeks    Status  Achieved      PT SHORT TERM GOAL #3   Title  Pt will improve gait velocity to > or = 3.8 ft/sec    Baseline  3.5 ft/sec    Time  4    Period  Weeks    Status  On-going    Target Date  12/31/16      PT SHORT TERM GOAL #4   Title  Will negotiate 2 stairs x 3 reps, no rails, alternating sequence with min A    Baseline  step to, min A    Time  4    Period  Weeks    Status  On-going    Target Date  12/31/16        PT Long Term Goals - 12/23/16 2255      PT LONG TERM GOAL #1   Title  Pt will perform balance and strengthening HEP with husband's supervision    Time  8    Period  Weeks    Status  On-going    Target Date  01/30/17      PT LONG TERM GOAL #2   Title  Pt will improve 6 min walk test distance by 100'     Baseline  12/09/16: 1041 no AD at baseline with no rest breaks taken    Time  8    Period  Weeks    Status  On-going    Target Date  01/30/17      PT LONG TERM GOAL #3   Title  Pt will improve  FGA score by 4 points from initial assessment    Baseline  12/09/16: 24/30 scored today for baseline    Time  8    Period  Weeks    Status  Revised    Target Date  01/30/17      PT LONG TERM GOAL #4   Title  Pt will improve gait velocity to > or = 4.0 ft/sec for improved safety in community    Baseline  3.5    Time  8    Period  Weeks    Status  On-going    Target Date  01/30/17       PT LONG TERM GOAL #5   Title  Pt will improve overall function (FOTO) to > or = 85%    Baseline  79%    Time  8    Period  Weeks    Status  On-going    Target Date  01/30/17      PT LONG TERM GOAL #6   Title  Ambulate 1000' over uneven outdoor surfaces/curbs with supervision and negotiate 2 stairs x 3 reps without rails, alternating sequence with supervision    Time  8    Period  Weeks    Status  On-going    Target Date  01/30/17      PT LONG TERM GOAL #7   Title  Will report returning to 50% of baseline function/activities (household activities, carrying boxes from utility building, decorating for Christmas)    Time  8    Period  Weeks    Status  On-going    Target Date  01/30/17       Plan - 12/23/16 1351    Clinical Impression Statement  Due to continued reports of dizziness when performing activities that require pt to look up to the ceiling or reach down to the floor performed vestibular assessment.  Pt tested negative for positional vertigo but did demonstrate signs and symptoms most consistent with R vestibular hypofunction.  Unable to assess DVA due to visual impairments s/p CVA.  Initiated training of x 1 viewing but will require further instruction.  Due to vestibular issues, session focused on continued work on balance activities (involving feet together with EO/EC and directional head turns, on level and compliant surfaces). Pt tolerated treatment well and will continue to benefit from further sessions to progress toward goals.     PT Frequency  2x / week    PT Duration  8 weeks    PT Treatment/Interventions  ADLs/Self Care Home Management;Gait training;Stair training;Functional mobility training;Therapeutic activities;Therapeutic exercise;Balance training;Neuromuscular re-education;Patient/family education;Energy conservation;Vestibular;Visual/perceptual remediation/compensation    PT Next Visit Plan  STG due by 12/8 (end of week).  Continue with balance activities  and gait on compliant surfaces.   Teach pt x 1 viewing in standing; balance with head movements to stimulate vestibular system - pt gets most dizzy with looking up and bending down to floor.    Consulted and Agree with Plan of Care  Patient       Patient will benefit from skilled therapeutic intervention in order to improve the following deficits and impairments:  Decreased activity tolerance, Decreased balance, Decreased endurance, Decreased strength, Difficulty walking, Dizziness, Impaired UE functional use, Impaired vision/preception, Pain  Visit Diagnosis: Hemiplegia and hemiparesis following cerebral infarction affecting right dominant side (HCC)  Unsteadiness on feet  Other lack of coordination  Dizziness and giddiness  Difficulty in walking, not elsewhere classified     Problem List Patient Active Problem  List   Diagnosis Date Noted  . CVA (cerebral vascular accident) ([place]) 10/25/2016  . Stroke (cerebrum) ([place]) 10/24/2016  . Essential hypertension 10/24/2016  . Diabetes mellitus with complication ([place]) 19/37/9024  . Chronic pain 10/24/2016    Andria Meuse, SPTA 12/23/2016, 10:57 PM  [place] [address] [place], [place], [address] Phone: [phone]   Fax:  [phone]  Name: Jamison Yuhasz MRN: [id number] Date of Birth: [birth year]

## 2016-12-27 ENCOUNTER — Ambulatory Visit: Payer: Medicare Other | Attending: Internal Medicine | Admitting: Occupational Therapy

## 2016-12-27 ENCOUNTER — Ambulatory Visit: Payer: Medicare Other

## 2016-12-27 ENCOUNTER — Other Ambulatory Visit: Payer: Self-pay

## 2016-12-27 ENCOUNTER — Encounter: Payer: Self-pay | Admitting: Occupational Therapy

## 2016-12-27 DIAGNOSIS — R4701 Aphasia: Secondary | ICD-10-CM | POA: Insufficient documentation

## 2016-12-27 DIAGNOSIS — R41840 Attention and concentration deficit: Secondary | ICD-10-CM | POA: Diagnosis present

## 2016-12-27 DIAGNOSIS — M6281 Muscle weakness (generalized): Secondary | ICD-10-CM | POA: Diagnosis present

## 2016-12-27 DIAGNOSIS — R41842 Visuospatial deficit: Secondary | ICD-10-CM | POA: Insufficient documentation

## 2016-12-27 DIAGNOSIS — R42 Dizziness and giddiness: Secondary | ICD-10-CM | POA: Diagnosis present

## 2016-12-27 DIAGNOSIS — R262 Difficulty in walking, not elsewhere classified: Secondary | ICD-10-CM | POA: Insufficient documentation

## 2016-12-27 DIAGNOSIS — R278 Other lack of coordination: Secondary | ICD-10-CM | POA: Insufficient documentation

## 2016-12-27 DIAGNOSIS — R41841 Cognitive communication deficit: Secondary | ICD-10-CM | POA: Insufficient documentation

## 2016-12-27 DIAGNOSIS — I69351 Hemiplegia and hemiparesis following cerebral infarction affecting right dominant side: Secondary | ICD-10-CM

## 2016-12-27 DIAGNOSIS — I69318 Other symptoms and signs involving cognitive functions following cerebral infarction: Secondary | ICD-10-CM | POA: Diagnosis present

## 2016-12-27 DIAGNOSIS — R2681 Unsteadiness on feet: Secondary | ICD-10-CM | POA: Diagnosis present

## 2016-12-27 NOTE — Therapy (Signed)
[facility] [address] [place], [place], [address] Phone: [phone]   Fax:  [phone]  Occupational Therapy Treatment  Patient Details  Name: Jill Howard MRN: [id number] Date of Birth: [birth year] Referring Provider: Lavella Hammock, [place]   Encounter Date: 12/27/2016  OT End of Session - 12/27/16 1452    Visit Number  7    Number of Visits  17    Date for OT Re-Evaluation  01/30/17    Authorization Type  Medicare/AARP; G-code needed    Authorization - Visit Number  7    Authorization - Number of Visits  10    OT Start Time  2637    OT Stop Time  1530    OT Time Calculation (min)  39 min    Activity Tolerance  Patient tolerated treatment well    Behavior During Therapy  [facility] for tasks assessed/performed       Past Medical History:  Diagnosis Date  . Diabetes mellitus without complication ([place])   . Hypertension   . Kidney stone   . Stroke ([place])   . Vertigo     Past Surgical History:  Procedure Laterality Date  . ABDOMINAL HYSTERECTOMY    . KIDNEY STONE SURGERY      There were no vitals filed for this visit.  Subjective Assessment - 12/27/16 1453    Subjective   I took a claritin and that has helped my dizziness.  I went to church for the first time with a group.  I was a little worried because I am so independent.      Patient is accompained by:  Family member    Pertinent History  vertigo, fibromyalgia, DM, HTN, hyperlipidemia, ?vascular dementia     Limitations  fall risk, R homonymous hemianopsia    Patient Stated Goals  improve balance, improve strength, improve vision, would like to be able to drive again    Currently in Pain?  No/denies    Pain Onset  More than a month ago        Environmental scanning in mod busy, dynamic environment with 100% accuracy today.    Hidden Pictures for complex tabletop scanning with 10/16 items found in 43mn.   Pt with good success/strategy and given  remaining for homework.  Long discussion regarding emotional changes after CVA due to pt reporting crying for 4hours when she typically doesn't cry at all.  Pt reports that she is not sure why she cried.  Pt also expresses worry about loss of independence, particularly with driving and community activities.  Pt reassured that she is still in early stages of recovery and is making good progress.    Discussed legal visual requirements for driving as well as ensuring that pt is compensating functionally for visual deficits.  Recommended pt set-up appointment for visual evaluation (Recommneded neuro-ophthalmology and discussed difference from typical ophthalmology/benefits and gave pt contact info if interested.  Also discussed other options or seeing regular ophthalmologist).  Pt reports that MD recommended pt wait until January/February.  Recommended pt setting up appt now for January to prevent delays/establish baseline since pt is progressing well and pt verbalized understanding/agreement.   Placing grooved pegs in pegboard with R hand for incr coordination and visual perceptual challenge.  Pt performed with min difficulty/incr time.                  OT Short Term Goals - 12/27/16 2156      OT SHORT TERM  GOAL #1   Title  Pt will be independent with initial HEP for RUE strength.--check STGs 12/30/16    Status  Achieved      OT SHORT TERM GOAL #2   Title  Pt will verbalize understanding of visual compensation strategies.    Status  Achieved      OT SHORT TERM GOAL #3   Title  Pt will perform simple environmental scanning/navigation with at least 90% accuracy for incr safety for community activities.    Status  Achieved 12/20/16  100% accuracy      OT SHORT TERM GOAL #4   Title  Pt will perform complex tabletop visual scanning and reading with at least 95% accuracy.    Status  Achieved        OT Long Term Goals - 12/07/16 1256      OT LONG TERM GOAL #1   Title  Pt will be  independent with updated RUE strengthening HEP.--check LTGs 01/30/17    Status  On-going      OT LONG TERM GOAL #2   Title  Pt will perform environmental scanning with simple divided attention with at least 90% accuracy for improved safety for community activities.    Status  On-going      OT LONG TERM GOAL #3   Title  Pt will perform all previous home maintenance tasks independently.    Status  On-going      OT LONG TERM GOAL #4   Title  Pt will perform environmental scanning/navigation in busy environment with at least 90% accuracy for improved safety for community activities.    Status  On-going            Plan - 12/27/16 1456    Clinical Impression Statement  Pt continues to make good progress with environmental scanning.  All STGs met and pt making good progress towards remaining LTGs.    Rehab Potential  Good    OT Frequency  2x / week    OT Duration  8 weeks    Plan  environmental scanning with divided attention, environmental scanning in busy environment    OT Home Exercise Plan  Education provided:  visual compensation strategies; yellow theraband HEP    Consulted and Agree with Plan of Care  Patient;Family member/caregiver       Patient will benefit from skilled therapeutic intervention in order to improve the following deficits and impairments:  Impaired vision/preception, Decreased mobility, Decreased balance, Decreased endurance, Decreased cognition, Decreased coordination, Decreased strength, Decreased activity tolerance  Visit Diagnosis: Hemiplegia and hemiparesis following cerebral infarction affecting right dominant side (HCC)  Unsteadiness on feet  Other lack of coordination  Attention and concentration deficit  Visuospatial deficit  Other symptoms and signs involving cognitive functions following cerebral infarction    Problem List Patient Active Problem List   Diagnosis Date Noted  . CVA (cerebral vascular accident) ([place]) 10/25/2016  . Stroke  (cerebrum) ([place]) 10/24/2016  . Essential hypertension 10/24/2016  . Diabetes mellitus with complication ([place]) 44/97/5300  . Chronic pain 10/24/2016    [facility] 12/27/2016, 9:57 PM  [facility] [address] [place], [place], [address] Phone: [phone]   Fax:  [phone]  Name: Jill Howard MRN: [id number] Date of Birth: February 23, 1940   Vianne Bulls, OTR/L [facility] [address] [place], [place]  [address] [phone] phone [phone] 12/27/16 9:57 PM

## 2016-12-28 ENCOUNTER — Encounter: Payer: Self-pay | Admitting: Physical Therapy

## 2016-12-28 ENCOUNTER — Ambulatory Visit: Payer: Medicare Other | Admitting: Physical Therapy

## 2016-12-28 DIAGNOSIS — R2681 Unsteadiness on feet: Secondary | ICD-10-CM

## 2016-12-28 DIAGNOSIS — R41840 Attention and concentration deficit: Secondary | ICD-10-CM

## 2016-12-28 DIAGNOSIS — I69318 Other symptoms and signs involving cognitive functions following cerebral infarction: Secondary | ICD-10-CM

## 2016-12-28 DIAGNOSIS — R278 Other lack of coordination: Secondary | ICD-10-CM

## 2016-12-28 DIAGNOSIS — I69351 Hemiplegia and hemiparesis following cerebral infarction affecting right dominant side: Secondary | ICD-10-CM | POA: Diagnosis not present

## 2016-12-28 DIAGNOSIS — R41842 Visuospatial deficit: Secondary | ICD-10-CM

## 2016-12-28 NOTE — Patient Instructions (Signed)
  Please complete the assigned speech therapy homework prior to your next session and return it to the speech therapist at your next visit.  

## 2016-12-28 NOTE — Therapy (Signed)
[facility] [address] [place], [place], [address] Phone: [phone]   Fax:  [phone]  Speech Language Pathology Treatment  Patient Details  Name: Jill NuttingCarol Baugh MRN: [id number] Date of Birth: 1940-04-22 Referring Provider: Guido Sanderonna Jean King PA-C   Encounter Date: 12/27/2016  End of Session - 12/28/16 1002    Visit Number  4    Number of Visits  17    Date for SLP Re-Evaluation  02/03/17    SLP Start Time  1532    SLP Stop Time   1615    SLP Time Calculation (min)  43 min    Activity Tolerance  Patient tolerated treatment well       Past Medical History:  Diagnosis Date  . Diabetes mellitus without complication (HCC)   . Hypertension   . Kidney stone   . Stroke (HCC)   . Vertigo     Past Surgical History:  Procedure Laterality Date  . ABDOMINAL HYSTERECTOMY    . KIDNEY STONE SURGERY      There were no vitals filed for this visit.  Subjective Assessment - 12/28/16 1001    Subjective  "I used to be so smart."            ADULT SLP TREATMENT - 12/28/16 0001      General Information   Behavior/Cognition  Alert;Cooperative;Pleasant mood      Treatment Provided   Treatment provided  Cognitive-Linquistic      Cognitive-Linquistic Treatment   Treatment focused on  Aphasia    Skilled Treatment  Pt did not complete homework correctly. She circled categories that had to do with "concrete". ("Items in a category: Concrete"). SLP worked with pt on naming - pt req'd mod-max A for naming 8 items in concrete category. Pt req'd max A after average 5.5 items in simple to mod complex categories.  In unscrambling sentences of 7 words pt req'd mod A occasionally, fading to min A occasionally.       Assessment / Recommendations / Plan   Plan  Continue with current plan of care      Progression Toward Goals   Progression toward goals  Progressing toward goals       SLP Education - 12/28/16 1001    Education provided  Yes    Education Details  reviewed word finding strategies    Person(s) Educated  Patient    Methods  Explanation;Demonstration    Comprehension  Verbalized understanding;Returned demonstration       SLP Short Term Goals - 12/28/16 1004      SLP SHORT TERM GOAL #1   Title  Pt will name 10 items for a simple category with occasional min A over 2 sessions    Time  2    Period  Weeks    Status  On-going      SLP SHORT TERM GOAL #2   Title  Pt will write at sentnece level, ID and correct her errors with occasional min A over 2 sessions    Time  2    Period  Weeks    Status  On-going      SLP SHORT TERM GOAL #3   Title  Pt will utilize compensations for aphasia in structured language tasks with occasional min cues over 3 sessions    Time  2    Period  Weeks    Status  On-going      SLP SHORT TERM GOAL #4   Title  Cognitive linguistic eval to be completed, goals added if indicated    Time  2    Period  Weeks    Status  On-going       SLP Long Term Goals - 12/28/16 1004      SLP LONG TERM GOAL #1   Title  Pt will name 8 items in a mildly complex category with occasional min A over 2 sessions    Time  6    Period  Weeks    Status  On-going      SLP LONG TERM GOAL #2   Title  Pt will write 2-3 sentnece paragraph, correcting her errors, with occasional min A over 2 sessions    Time  6    Period  Weeks    Status  On-going      SLP LONG TERM GOAL #3   Title  Pt will utilize compensations for aphasia over 12 minute milldy complex conversation with 2 or less requests for repair/clarification by ST over 3 sessions    Time  6    Period  Weeks    Status  On-going       Plan - 12/28/16 1002    Clinical Impression Statement  Pt presents today with impairment in specific word finding tasks (divergent, moreso with less-common or min-mod complex categories). SLP noted 2 paraphasic errors during extended conversation. Continued ST intervention is recommended to  maximize effective communication for improved QOL. Pt would benefit from formal cognitive testing.     Speech Therapy Frequency  2x / week    Duration  -- 8 weeks16 visits    Treatment/Interventions  Compensatory strategies;Patient/family education;Functional tasks;Cueing hierarchy;Environmental controls;Cognitive reorganization;Multimodal communcation approach;Language facilitation;Internal/external aids;SLP instruction and feedback    Potential to Achieve Goals  Good    Potential Considerations  Ability to learn/carryover information    SLP Home Exercise Plan  continue worksheets    Consulted and Agree with Plan of Care  Patient       Patient will benefit from skilled therapeutic intervention in order to improve the following deficits and impairments:   Aphasia  Cognitive communication deficit    Problem List Patient Active Problem List   Diagnosis Date Noted  . CVA (cerebral vascular accident) (HCC) 10/25/2016  . 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 Essential hypertension 10/24/2016  . Diabetes mellitus with complication (HCC) 10/24/2016  . Chronic pain 10/24/2016    [facility] Center[facility]INKE,Damauri Minion ,MS, CCC-SLP  12/28/2016, 10:05 AM   [facility] [address] [place], [place], [address] Phone: [phone]   Fax:  [phone]   Name: Jill NuttingCarol Printup MRN: [id number] Date of Birth: 25-Feb-1940

## 2016-12-29 ENCOUNTER — Ambulatory Visit: Payer: Medicare Other | Admitting: Occupational Therapy

## 2016-12-29 ENCOUNTER — Encounter: Payer: Self-pay | Admitting: Occupational Therapy

## 2016-12-29 DIAGNOSIS — I69351 Hemiplegia and hemiparesis following cerebral infarction affecting right dominant side: Secondary | ICD-10-CM | POA: Diagnosis not present

## 2016-12-29 DIAGNOSIS — R278 Other lack of coordination: Secondary | ICD-10-CM

## 2016-12-29 DIAGNOSIS — R41840 Attention and concentration deficit: Secondary | ICD-10-CM

## 2016-12-29 DIAGNOSIS — R41842 Visuospatial deficit: Secondary | ICD-10-CM

## 2016-12-29 DIAGNOSIS — I69318 Other symptoms and signs involving cognitive functions following cerebral infarction: Secondary | ICD-10-CM

## 2016-12-29 DIAGNOSIS — R2681 Unsteadiness on feet: Secondary | ICD-10-CM

## 2016-12-29 NOTE — Therapy (Addendum)
[facility] [address] [place], [place], [address] Phone: [phone]   Fax:  [phone]  Occupational Therapy Treatment  Patient Details  Name: Jill NuttingCarol Howard MRN: [id number] Date of Birth: [birth year] Referring Provider: Guido Sanderonna Jean King, [place]-C   Encounter Date: 12/29/2016  OT End of Session - 12/29/16 1454    Visit Number  8    Number of Visits  17    Date for OT Re-Evaluation  01/30/17    Authorization Type  Medicare/AARP; G-code needed    Authorization - Visit Number  8    Authorization - Number of Visits  10    OT Start Time  1448    OT Stop Time  1530    OT Time Calculation (min)  42 min    Activity Tolerance  Patient tolerated treatment well    Behavior During Therapy  [facility] for tasks assessed/performed       Past Medical History:  Diagnosis Date  . Diabetes mellitus without complication (HCC)   . Hypertension   . Kidney stone   . Stroke (HCC)   . Vertigo     Past Surgical History:  Procedure Laterality Date  . ABDOMINAL HYSTERECTOMY    . KIDNEY STONE SURGERY      There were no vitals filed for this visit.  Subjective Assessment - 12/29/16 1452    Subjective   Pt reports that she had to leave new store that was busy due to feeling overwhelmed    Patient is accompained by:  Family member    Pertinent History  vertigo, fibromyalgia, DM, HTN, hyperlipidemia, ?vascular dementia     Limitations  fall risk, R homonymous hemianopsia    Patient Stated Goals  improve balance, improve strength, improve vision, would like to be able to drive again    Currently in Pain?  No/denies    Pain Onset  More than a month ago        In sitting, placing small pegs in vertical pegboard with RUE for incr coordination/activity tolerance to copy small peg design with good accuracy with visual scanning with cognitive component.  Environmental scanning in busy, dynamic environment with 100% accuracy today with simple  divided attention (conversation).     Environmental scanning in min distracting environment while tossing ball between hand with 100% accuracy.  Ambulating/navigating environment while tossing scarf between hands with no LOB.  Completed hidden picture with min v.c.   Discussed progress towards goals and provided reassurance/encouragement regarding progress.           OT Short Term Goals - 12/27/16 2156      OT SHORT TERM GOAL #1   Title  Pt will be independent with initial HEP for RUE strength.--check STGs 12/30/16    Status  Achieved      OT SHORT TERM GOAL #2   Title  Pt will verbalize understanding of visual compensation strategies.    Status  Achieved      OT SHORT TERM GOAL #3   Title  Pt will perform simple environmental scanning/navigation with at least 90% accuracy for incr safety for community activities.    Status  Achieved 12/20/16  100% accuracy      OT SHORT TERM GOAL #4   Title  Pt will perform complex tabletop visual scanning and reading with at least 95% accuracy.    Status  Achieved        OT Long Term Goals - 12/07/16 1256  OT LONG TERM GOAL #1   Title  Pt will be independent with updated RUE strengthening HEP.--check LTGs 01/30/17    Status  On-going      OT LONG TERM GOAL #2   Title  Pt will perform environmental scanning with simple divided attention with at least 90% accuracy for improved safety for community activities.    Status  On-going      OT LONG TERM GOAL #3   Title  Pt will perform all previous home maintenance tasks independently.    Status  On-going      OT LONG TERM GOAL #4   Title  Pt will perform environmental scanning/navigation in busy environment with at least 90% accuracy for improved safety for community activities.    Status  On-going            Plan - 12/29/16 1454    Clinical Impression Statement  Pt continues to make good progress with environmental scanning.  Pt reports difficulty in new, busy environments  and continues to get frustrated about how much time that it takes her to complete tasks, but does well in clinic.    Rehab Potential  Good    OT Frequency  2x / week    OT Duration  8 weeks    Plan  typing, environmental scanning with divided attention/environmental scanning in busy environment, update HEP    OT Home Exercise Plan  Education provided:  visual compensation strategies; yellow theraband HEP    Consulted and Agree with Plan of Care  Patient;Family member/caregiver       Patient will benefit from skilled therapeutic intervention in order to improve the following deficits and impairments:  Impaired vision/preception, Decreased mobility, Decreased balance, Decreased endurance, Decreased cognition, Decreased coordination, Decreased strength, Decreased activity tolerance  Visit Diagnosis: Visuospatial deficit  Hemiplegia and hemiparesis following cerebral infarction affecting right dominant side (HCC)  Unsteadiness on feet  Other lack of coordination  Other symptoms and signs involving cognitive functions following cerebral infarction  Attention and concentration deficit    Problem List Patient Active Problem List   Diagnosis Date Noted  . CVA (cerebral vascular accident) (HCC) 10/25/2016  . 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 Essential hypertension 10/24/2016  . Diabetes mellitus with complication (HCC) 10/24/2016  . Chronic pain 10/24/2016    [facility] Center[facility]REEMAN,Adien Kimmel 12/29/2016, 5:51 PM  [facility] [address] [place], [place], [address] Phone: [phone]   Fax:  [phone]  Name: Jill NuttingCarol Narez MRN: [id number] Date of Birth: 1940-06-28   Jill FraterAngela Dequavion Howard, OTR/L [facility] [address] [place], [place]  [address] [phone] phone [phone] 12/29/16 5:51 PM

## 2016-12-29 NOTE — Therapy (Signed)
[facility] [address] [place], [place], [address] Phone: [phone]   Fax:  [phone]  Physical Therapy Treatment  Patient Details  Name: Jill Howard MRN: [id number] Date of Birth: May 16, 1940 Referring Provider: Lavella Hammock, [place]   Encounter Date: 12/28/2016  PT End of Session - 12/28/16 1454    Visit Number  6    Number of Visits  17    Date for PT Re-Evaluation  01/30/17    Authorization Type  Medicare/AARP G code and PN every 10th visit    PT Start Time  1447    PT Stop Time  1529    PT Time Calculation (min)  42 min    Activity Tolerance  Patient tolerated treatment well    Behavior During Therapy  [facility] for tasks assessed/performed       Past Medical History:  Diagnosis Date  . Diabetes mellitus without complication ([place])   . Hypertension   . Kidney stone   . Stroke ([place])   . Vertigo     Past Surgical History:  Procedure Laterality Date  . ABDOMINAL HYSTERECTOMY    . KIDNEY STONE SURGERY      There were no vitals filed for this visit.  Subjective Assessment - 12/28/16 1452    Subjective  No new complaints, no falls but pt reports dizziness a few days ago but is feeling much better. No pain to report.     Patient is accompained by:  Family member    Pertinent History  bilat knee OA, vertigo, diabetes, TIA, HTN, hyperlipidemia, fibromyalgia, vascular dementia, and migraines    Limitations  Standing;Walking    Patient Stated Goals  To improve balance to prevent a fall and gain more strength in her hand    Currently in Pain?  No/denies       [facility] Adult PT Treatment/Exercise - 12/28/16 1500      Ambulation/Gait   Ambulation/Gait  --    Gait velocity  9.12= 3.59 ft/sec    Stairs  Yes    Stairs Assistance  5: Supervision;4: Min assist    Stairs Assistance Details (indicate cue type and reason)  Pt ascended/descended stairs x3, pt required one rail descending stairs with an alt. pattern,  no LOB noted but with decreased speed.     Stair Management Technique  One rail Right;Alternating pattern;Forwards    Number of Stairs  4 X3    Height of Stairs  6      Neuro Re-ed    Neuro Re-ed Details   Pt in corner standing on airex EO performing head movements: head turns/head nods with cues for slower head movements and some complaints of dizziness, then progressing to EC static standing with no UE support for 30 secs x3, minimal postural sway but some fatigue which required a seated rest break.      Pt standing at counter with single UE support stepping fwd/back/side over small orange hurdles, pt demonstrated one episode of imbalance and required cues for step-to pattern with fwd/back motion and to allow room for opposite LE with side stepping. x5 reps each way               PT Short Term Goals - 12/29/16 0834      PT SHORT TERM GOAL #1   Title  Pt will participate in further balance and endurance testing - 6 min walk, FGA    Baseline  see LTGs    Status  Achieved  Target Date  12/31/16      PT SHORT TERM GOAL #2   Title  Pt will participate in further vestibular assessment     Baseline  met 11/30    Time  4    Period  Weeks    Status  Achieved    Target Date  12/31/16      PT SHORT TERM GOAL #3   Title  Pt will improve gait velocity to > or = 3.8 ft/sec    Baseline  12/5: 3.6 ft/sec    Time  4    Period  Weeks    Status  Not Met    Target Date  12/31/16      PT SHORT TERM GOAL #4   Title  Will negotiate 2 stairs x 3 reps, no rails, alternating sequence with min A    Baseline  12/5: Pt demonstrated alt. pattern with supervision but required one rail.     Time  4    Period  Weeks    Status  Partially Met    Target Date  12/31/16        PT Long Term Goals - 12/23/16 2255      PT LONG TERM GOAL #1   Title  Pt will perform balance and strengthening HEP with husband's supervision    Time  8    Period  Weeks    Status  On-going    Target Date  01/30/17       PT LONG TERM GOAL #2   Title  Pt will improve 6 min walk test distance by 100'     Baseline  12/09/16: 1041 no AD at baseline with no rest breaks taken    Time  8    Period  Weeks    Status  On-going    Target Date  01/30/17      PT LONG TERM GOAL #3   Title  Pt will improve FGA score by 4 points from initial assessment    Baseline  12/09/16: 24/30 scored today for baseline    Time  8    Period  Weeks    Status  Revised    Target Date  01/30/17      PT LONG TERM GOAL #4   Title  Pt will improve gait velocity to > or = 4.0 ft/sec for improved safety in community    Baseline  3.5    Time  8    Period  Weeks    Status  On-going    Target Date  01/30/17      PT LONG TERM GOAL #5   Title  Pt will improve overall function (FOTO) to > or = 85%    Baseline  79%    Time  8    Period  Weeks    Status  On-going    Target Date  01/30/17      PT LONG TERM GOAL #6   Title  Ambulate 1000' over uneven outdoor surfaces/curbs with supervision and negotiate 2 stairs x 3 reps without rails, alternating sequence with supervision    Time  8    Period  Weeks    Status  On-going    Target Date  01/30/17      PT LONG TERM GOAL #7   Title  Will report returning to 50% of baseline function/activities (household activities, carrying boxes from utility building, decorating for Christmas)    Time  8    Period  Weeks  Status  On-going    Target Date  01/30/17            Plan - 12/29/16 0831    Clinical Impression Statement  Pt tolerated treatment well with no limitations due to pain and minimal limitations due to fatigue. Todays session focused on high level balance training with minimal loss of balance with activity. Pt partially met and did not meet the remaining 2 STG's. Pt would benefit from continued PT session to progress towards LTG's.     PT Frequency  2x / week    PT Duration  8 weeks    PT Treatment/Interventions  ADLs/Self Care Home Management;Gait training;Stair  training;Functional mobility training;Therapeutic activities;Therapeutic exercise;Balance training;Neuromuscular re-education;Patient/family education;Energy conservation;Vestibular;Visual/perceptual remediation/compensation    PT Next Visit Plan  Continue with balance activities and gait on compliant surfaces.   Teach pt x 1 viewing in standing; balance with head movements to stimulate vestibular system - pt gets most dizzy with looking up and bending down to floor.    Consulted and Agree with Plan of Care  Patient       Patient will benefit from skilled therapeutic intervention in order to improve the following deficits and impairments:  Decreased activity tolerance, Decreased balance, Decreased endurance, Decreased strength, Difficulty walking, Dizziness, Impaired UE functional use, Impaired vision/preception, Pain  Visit Diagnosis: Hemiplegia and hemiparesis following cerebral infarction affecting right dominant side (HCC)  Unsteadiness on feet  Other lack of coordination  Visuospatial deficit  Other symptoms and signs involving cognitive functions following cerebral infarction  Attention and concentration deficit     Problem List Patient Active Problem List   Diagnosis Date Noted  . CVA (cerebral vascular accident) ([place]) 10/25/2016  . Stroke (cerebrum) ([place]) 10/24/2016  . Essential hypertension 10/24/2016  . Diabetes mellitus with complication ([place]) 44/31/5400  . Chronic pain 10/24/2016   Lamoyne Palencia, SPTA  Michaelene Dutan 12/29/2016, 8:39 AM  [facility] [address] [place], [place], [address] Phone: [phone]   Fax:  [phone]  Name: Jill Howard MRN: [id number] Date of Birth: 11-01-1940

## 2016-12-30 ENCOUNTER — Encounter: Payer: Self-pay | Admitting: Physical Therapy

## 2016-12-30 ENCOUNTER — Ambulatory Visit: Payer: Medicare Other | Admitting: Physical Therapy

## 2016-12-30 DIAGNOSIS — R2681 Unsteadiness on feet: Secondary | ICD-10-CM

## 2016-12-30 DIAGNOSIS — M6281 Muscle weakness (generalized): Secondary | ICD-10-CM

## 2016-12-30 DIAGNOSIS — R42 Dizziness and giddiness: Secondary | ICD-10-CM

## 2016-12-30 DIAGNOSIS — I69351 Hemiplegia and hemiparesis following cerebral infarction affecting right dominant side: Secondary | ICD-10-CM | POA: Diagnosis not present

## 2016-12-30 DIAGNOSIS — R262 Difficulty in walking, not elsewhere classified: Secondary | ICD-10-CM

## 2016-12-30 NOTE — Patient Instructions (Signed)
Gaze Stabilization - Tip Card  1.Target must remain in focus, not blurry, and appear stationary while head is in motion. 2.Perform exercises with small head movements (45 to either side of midline). 3.Increase speed of head motion so long as target is in focus. 4.If you wear eyeglasses, be sure you can see target through lens (therapist will give specific instructions for bifocal / progressive lenses). 5.These exercises may provoke dizziness or nausea. Work through these symptoms. If too dizzy, slow head movement slightly. Rest between each exercise. 6.Exercises demand concentration; avoid distractions. 7.For safety, perform standing exercises close to a counter, wall, corner, or next to someone.  Copyright  VHI. All rights reserved.   Gaze Stabilization - Standing Feet Apart   Feet shoulder width apart, keeping eyes on target on wall 3 feet away, tilt head down slightly and move head side to side for 60 seconds. Repeat while moving head up and down for 60 seconds.  Repeat both directions standing with feet together. Do 2-3 sessions per day.   Copyright  VHI. All rights reserved.

## 2016-12-30 NOTE — Therapy (Signed)
[facility] [address] [place], [place], [address] Phone: [phone]   Fax:  [phone]  Physical Therapy Treatment  Patient Details  Name: Jill Howard MRN: [id number] Date of Birth: 12/31/1940 Referring Provider: Lavella Hammock, [place]   Encounter Date: 12/30/2016  PT End of Session - 12/30/16 1637    Visit Number  7    Number of Visits  17    Date for PT Re-Evaluation  01/30/17    Authorization Type  Medicare/AARP G code and PN every 10th visit    PT Start Time  1537    PT Stop Time  1615    PT Time Calculation (min)  38 min    Activity Tolerance  Patient tolerated treatment well    Behavior During Therapy  [facility] for tasks assessed/performed       Past Medical History:  Diagnosis Date  . Diabetes mellitus without complication ([place])   . Hypertension   . Kidney stone   . Stroke ([place])   . Vertigo     Past Surgical History:  Procedure Laterality Date  . ABDOMINAL HYSTERECTOMY    . KIDNEY STONE SURGERY      There were no vitals filed for this visit.  Subjective Assessment - 12/30/16 1540    Subjective  Pt has been very busy, setting up for a funeral at church, packing items back in storage, visiting a friend, doing a few loads of laundry.  No increase in fatigue.  Reports she needs a new mattress; her back starts hurting and she has to get up.  Has started to take Claritin and that has helped her dizziness; still feeling dizzy with eyes closed and quick turns.    Patient is accompained by:  Family member    Pertinent History  bilat knee OA, vertigo, diabetes, TIA, HTN, hyperlipidemia, fibromyalgia, vascular dementia, and migraines    Limitations  Standing;Walking    Patient Stated Goals  To improve balance to prevent a fall and gain more strength in her hand    Currently in Pain?  No/denies                       Vestibular Treatment/Exercise - 12/30/16 1554      Vestibular  Treatment/Exercise   Vestibular Treatment Provided  Gaze;Habituation    Habituation Exercises  Seated Vertical Head Turns;Standing Vertical Head Turns    Gaze Exercises  X1 Viewing Horizontal;X1 Viewing Vertical      Seated Vertical Head Turns   Number of Reps   10    Symptom Description   reaching to floor to pick up 2lb ball with use of breathing sequence for increased postural control      Standing Vertical Head Turns   Number of Reps   10    Symptom Description   reaching to floor initially but due to knee pain changed to reaching to mat and then lifting ball up over head with eyes following using breathing sequence for postural control      X1 Viewing Horizontal   Foot Position  standing feet apart, feet together    Reps  3    Comments  30 to 60 seconds      X1 Viewing Vertical   Foot Position  standing feet apart, feet together    Reps  3    Comments  30 to 60 seconds            PT Education - 12/30/16  1634    Education provided  Yes    Education Details  x 1 viewing in standing for HEP    Person(s) Educated  Patient    Methods  Explanation       PT Short Term Goals - 12/29/16 0834      PT SHORT TERM GOAL #1   Title  Pt will participate in further balance and endurance testing - 6 min walk, FGA    Baseline  see LTGs    Status  Achieved    Target Date  12/31/16      PT SHORT TERM GOAL #2   Title  Pt will participate in further vestibular assessment     Baseline  met 11/30    Time  4    Period  Weeks    Status  Achieved    Target Date  12/31/16      PT SHORT TERM GOAL #3   Title  Pt will improve gait velocity to > or = 3.8 ft/sec    Baseline  12/5: 3.6 ft/sec    Time  4    Period  Weeks    Status  Not Met    Target Date  12/31/16      PT SHORT TERM GOAL #4   Title  Will negotiate 2 stairs x 3 reps, no rails, alternating sequence with min A    Baseline  12/5: Pt demonstrated alt. pattern with supervision but required one rail.     Time  4    Period   Weeks    Status  Partially Met    Target Date  12/31/16        PT Long Term Goals - 12/23/16 2255      PT LONG TERM GOAL #1   Title  Pt will perform balance and strengthening HEP with husband's supervision    Time  8    Period  Weeks    Status  On-going    Target Date  01/30/17      PT LONG TERM GOAL #2   Title  Pt will improve 6 min walk test distance by 100'     Baseline  12/09/16: 1041 no AD at baseline with no rest breaks taken    Time  8    Period  Weeks    Status  On-going    Target Date  01/30/17      PT LONG TERM GOAL #3   Title  Pt will improve FGA score by 4 points from initial assessment    Baseline  12/09/16: 24/30 scored today for baseline    Time  8    Period  Weeks    Status  Revised    Target Date  01/30/17      PT LONG TERM GOAL #4   Title  Pt will improve gait velocity to > or = 4.0 ft/sec for improved safety in community    Baseline  3.5    Time  8    Period  Weeks    Status  On-going    Target Date  01/30/17      PT LONG TERM GOAL #5   Title  Pt will improve overall function (FOTO) to > or = 85%    Baseline  79%    Time  8    Period  Weeks    Status  On-going    Target Date  01/30/17      PT LONG TERM GOAL #6   Title  Ambulate 1000' over uneven outdoor surfaces/curbs with supervision and negotiate 2 stairs x 3 reps without rails, alternating sequence with supervision    Time  8    Period  Weeks    Status  On-going    Target Date  01/30/17      PT LONG TERM GOAL #7   Title  Will report returning to 50% of baseline function/activities (household activities, carrying boxes from utility building, decorating for Christmas)    Time  8    Period  Weeks    Status  On-going    Target Date  01/30/17            Plan - 12/30/16 1637    Clinical Impression Statement  Continued vestibular training with review of x 1 viewing in standing with feet apart progressing to feet together; during feet together pt reported feeling as if she were  falling forwards or backwards.  No LOB with x 1 viewing.  Continued habituation training with reaching to the floor and looking up coordinated with breathing.  Pt tolerated well with minimal reports of dizziness and no LOB.  Will continue to progress towards LTG.    PT Frequency  2x / week    PT Duration  8 weeks    PT Treatment/Interventions  ADLs/Self Care Home Management;Gait training;Stair training;Functional mobility training;Therapeutic activities;Therapeutic exercise;Balance training;Neuromuscular re-education;Patient/family education;Energy conservation;Vestibular;Visual/perceptual remediation/compensation    PT Next Visit Plan  Continue with balance activities and gait on compliant surfaces.   progress x 1 viewing; balance with head movements to stimulate vestibular system - pt gets most dizzy with looking up and bending down to floor.    Consulted and Agree with Plan of Care  Patient       Patient will benefit from skilled therapeutic intervention in order to improve the following deficits and impairments:  Decreased activity tolerance, Decreased balance, Decreased endurance, Decreased strength, Difficulty walking, Dizziness, Impaired UE functional use, Impaired vision/preception, Pain  Visit Diagnosis: Hemiplegia and hemiparesis following cerebral infarction affecting right dominant side (HCC)  Unsteadiness on feet  Dizziness and giddiness  Difficulty in walking, not elsewhere classified  Muscle weakness (generalized)     Problem List Patient Active Problem List   Diagnosis Date Noted  . CVA (cerebral vascular accident) ([place]) 10/25/2016  . Stroke (cerebrum) ([place]) 10/24/2016  . Essential hypertension 10/24/2016  . Diabetes mellitus with complication ([place]) 25/63/8937  . Chronic pain 10/24/2016    Rico Junker, PT, DPT 12/30/16    4:42 PM    [facility] [address] [place], [place], [address] Phone:  [phone]   Fax:  [phone]  Name: Jill Howard MRN: [id number] Date of Birth: Aug 26, 1940

## 2017-01-02 ENCOUNTER — Encounter: Payer: Medicare Other | Admitting: Occupational Therapy

## 2017-01-03 ENCOUNTER — Ambulatory Visit: Payer: Medicare Other | Admitting: Physical Therapy

## 2017-01-03 ENCOUNTER — Encounter: Payer: Self-pay | Admitting: Physical Therapy

## 2017-01-03 DIAGNOSIS — I69351 Hemiplegia and hemiparesis following cerebral infarction affecting right dominant side: Secondary | ICD-10-CM | POA: Diagnosis not present

## 2017-01-03 DIAGNOSIS — R42 Dizziness and giddiness: Secondary | ICD-10-CM

## 2017-01-03 DIAGNOSIS — R2681 Unsteadiness on feet: Secondary | ICD-10-CM

## 2017-01-03 NOTE — Patient Instructions (Signed)
  Gaze Stabilization - Tip Card  1.Target must remain in focus, not blurry, and appear stationary while head is in motion. 2.Perform exercises with small head movements (45 to either side of midline). 3.Increase speed of head motion so long as target is in focus. 4.If you wear eyeglasses, be sure you can see target through lens (therapist will give specific instructions for bifocal / progressive lenses). 5.These exercises may provoke dizziness or nausea. Work through these symptoms. If too dizzy, slow head movement slightly. Rest between each exercise. 6.Exercises demand concentration; avoid distractions. 7.For safety, perform standing exercises close to a counter, wall, corner, or next to someone.   Gaze Stabilization: Standing Feet Partial Heel-Toe    Feet in partial heel-toe position, keeping eyes on target on wall 3 feet away, tilt head down slightly and move head side to side for 30-60 seconds. Repeat while moving head up and down for 30-60 seconds.  Repeat both directions with other foot in front.  Do 2-3 sessions per day. Copyright  V[employer]. All rights reserved.

## 2017-01-03 NOTE — Therapy (Signed)
[facility] [address] [place], [place], [address] Phone: [phone]   Fax:  [phone]  Physical Therapy Treatment  Patient Details  Name: Jill Howard MRN: [id number] Date of Birth: 20-Nov-1940 Referring Provider: Lavella Hammock, [place]   Encounter Date: 01/03/2017  PT End of Session - 01/03/17 1425    Visit Number  8    Number of Visits  17    Date for PT Re-Evaluation  01/30/17    Authorization Type  Medicare/AARP G code and PN every 10th visit    PT Start Time  1332    PT Stop Time  1417    PT Time Calculation (min)  45 min    Activity Tolerance  Patient tolerated treatment well    Behavior During Therapy  [facility] for tasks assessed/performed       Past Medical History:  Diagnosis Date  . Diabetes mellitus without complication ([place])   . Hypertension   . Kidney stone   . Stroke ([place])   . Vertigo     Past Surgical History:  Procedure Laterality Date  . ABDOMINAL HYSTERECTOMY    . KIDNEY STONE SURGERY      There were no vitals filed for this visit.  Subjective Assessment - 01/03/17 1336    Subjective  Dizziness is better--even bending over and standing up is much better. Taking the Claritin during the day is helping.     Patient is accompained by:  Family member    Pertinent History  bilat knee OA, vertigo, diabetes, TIA, HTN, hyperlipidemia, fibromyalgia, vascular dementia, and migraines    Limitations  Standing;Walking    Patient Stated Goals  To improve balance to prevent a fall and gain more strength in her hand    Currently in Pain?  No/denies                       Vestibular Treatment/Exercise - 01/03/17 1357      Vestibular Treatment/Exercise   Vestibular Treatment Provided  Gaze    Gaze Exercises  X1 Viewing Horizontal;X1 Viewing Vertical;Eye/Head Exercise Vertical      Standing Vertical Head Turns   Number of Reps   -- 60 sec    Symptom Description   standing on blue  mat on ramp (facing incline) with tossing 14" ball up over head and catching in front of herself with following with her eyes; standing on blue mat on ramp (facing incline) with tossing 14" ball up over head and catching in front of herself with following with her eyes      X1 Viewing Horizontal   Foot Position  together and partial heel toe    Time  -- 60 sec; 40 sec    Reps  2    Comments  at 40 sec began to see double with dizziness 5/10      X1 Viewing Vertical   Foot Position  together and partial heel toe    Time  -- 60sec; 35 sec      Eye/Head Exercise Vertical   Foot Position  walking tossing ball over her head and catching    Reps  -- 120 ft    Comments  drift to left x 2 with independent recovery         Balance Exercises - 01/03/17 1423      Balance Exercises: Standing   SLS with Vectors  Foam/compliant surface single then double cone taps on blue mat  Rockerboard  Anterior/posterior;EC;10 seconds;20 seconds;30 seconds;Intermittent UE support    Step Over Hurdles / Cones  6" hurdles on blue mat, fwd, sideways and backwards x 4 lengthseach        PT Education - 01/03/17 1425    Education provided  Yes    Education Details  updated HEP and rationale    Person(s) Educated  Patient    Methods  Explanation;Demonstration;Handout;Verbal cues    Comprehension  Verbalized understanding;Returned demonstration;Need further instruction       PT Short Term Goals - 12/29/16 0834      PT SHORT TERM GOAL #1   Title  Pt will participate in further balance and endurance testing - 6 min walk, FGA    Baseline  see LTGs    Status  Achieved    Target Date  12/31/16      PT SHORT TERM GOAL #2   Title  Pt will participate in further vestibular assessment     Baseline  met 11/30    Time  4    Period  Weeks    Status  Achieved    Target Date  12/31/16      PT SHORT TERM GOAL #3   Title  Pt will improve gait velocity to > or = 3.8 ft/sec    Baseline  12/5: 3.6 ft/sec     Time  4    Period  Weeks    Status  Not Met    Target Date  12/31/16      PT SHORT TERM GOAL #4   Title  Will negotiate 2 stairs x 3 reps, no rails, alternating sequence with min A    Baseline  12/5: Pt demonstrated alt. pattern with supervision but required one rail.     Time  4    Period  Weeks    Status  Partially Met    Target Date  12/31/16        PT Long Term Goals - 12/23/16 2255      PT LONG TERM GOAL #1   Title  Pt will perform balance and strengthening HEP with husband's supervision    Time  8    Period  Weeks    Status  On-going    Target Date  01/30/17      PT LONG TERM GOAL #2   Title  Pt will improve 6 min walk test distance by 100'     Baseline  12/09/16: 1041 no AD at baseline with no rest breaks taken    Time  8    Period  Weeks    Status  On-going    Target Date  01/30/17      PT LONG TERM GOAL #3   Title  Pt will improve FGA score by 4 points from initial assessment    Baseline  12/09/16: 24/30 scored today for baseline    Time  8    Period  Weeks    Status  Revised    Target Date  01/30/17      PT LONG TERM GOAL #4   Title  Pt will improve gait velocity to > or = 4.0 ft/sec for improved safety in community    Baseline  3.5    Time  8    Period  Weeks    Status  On-going    Target Date  01/30/17      PT LONG TERM GOAL #5   Title  Pt will improve overall function (FOTO) to >  or = 85%    Baseline  79%    Time  8    Period  Weeks    Status  On-going    Target Date  01/30/17      PT LONG TERM GOAL #6   Title  Ambulate 1000' over uneven outdoor surfaces/curbs with supervision and negotiate 2 stairs x 3 reps without rails, alternating sequence with supervision    Time  8    Period  Weeks    Status  On-going    Target Date  01/30/17      PT LONG TERM GOAL #7   Title  Will report returning to 50% of baseline function/activities (household activities, carrying boxes from utility building, decorating for Christmas)    Time  8    Period   Weeks    Status  On-going    Target Date  01/30/17            Plan - 01/03/17 1426    Clinical Impression Statement  Patient tolerated x 1 VOR exercises with feet apart and then together with no imbalance, incr dizziness, or blurry vision. Progressed to partial tandem and pt only tolerated 30-40 sec when vision becamd double and rated dizziness 5/10. Updated HEP to partial tandem stance. Remainder of session continued to focus on vestibular and balance training. Patient progressing well and will benefit from PT to achieve LTGs    PT Frequency  2x / week    PT Duration  8 weeks    PT Treatment/Interventions  ADLs/Self Care Home Management;Gait training;Stair training;Functional mobility training;Therapeutic activities;Therapeutic exercise;Balance training;Neuromuscular re-education;Patient/family education;Energy conservation;Vestibular;Visual/perceptual remediation/compensation    PT Next Visit Plan  Continue with balance activities and gait on compliant surfaces.   progress x 1 viewing; balance with head movements to stimulate vestibular system - pt gets most dizzy with looking up and bending down to floor.    Consulted and Agree with Plan of Care  Patient       Patient will benefit from skilled therapeutic intervention in order to improve the following deficits and impairments:  Decreased activity tolerance, Decreased balance, Decreased endurance, Decreased strength, Difficulty walking, Dizziness, Impaired UE functional use, Impaired vision/preception, Pain  Visit Diagnosis: Dizziness and giddiness  Unsteadiness on feet     Problem List Patient Active Problem List   Diagnosis Date Noted  . CVA (cerebral vascular accident) ([place]) 10/25/2016  . Stroke (cerebrum) ([place]) 10/24/2016  . Essential hypertension 10/24/2016  . Diabetes mellitus with complication ([place]) 38/25/0539  . Chronic pain 10/24/2016    Rexanne Mano, PT 01/03/2017, 2:30 PM  [facility] [address] [place], [place], [address] Phone: [phone]   Fax:  [phone]  Name: Jill Howard MRN: [id number] Date of Birth: Jul 13, 1940

## 2017-01-05 ENCOUNTER — Encounter: Payer: Self-pay | Admitting: Occupational Therapy

## 2017-01-05 ENCOUNTER — Ambulatory Visit: Payer: Medicare Other | Admitting: Speech Pathology

## 2017-01-05 ENCOUNTER — Other Ambulatory Visit: Payer: Self-pay

## 2017-01-05 ENCOUNTER — Ambulatory Visit: Payer: Medicare Other | Admitting: Occupational Therapy

## 2017-01-05 ENCOUNTER — Encounter: Payer: Self-pay | Admitting: Speech Pathology

## 2017-01-05 VITALS — BP 125/70 | HR 79

## 2017-01-05 DIAGNOSIS — I69351 Hemiplegia and hemiparesis following cerebral infarction affecting right dominant side: Secondary | ICD-10-CM

## 2017-01-05 DIAGNOSIS — I69318 Other symptoms and signs involving cognitive functions following cerebral infarction: Secondary | ICD-10-CM

## 2017-01-05 DIAGNOSIS — R4701 Aphasia: Secondary | ICD-10-CM

## 2017-01-05 DIAGNOSIS — R41842 Visuospatial deficit: Secondary | ICD-10-CM

## 2017-01-05 DIAGNOSIS — R2681 Unsteadiness on feet: Secondary | ICD-10-CM

## 2017-01-05 DIAGNOSIS — M6281 Muscle weakness (generalized): Secondary | ICD-10-CM

## 2017-01-05 DIAGNOSIS — R278 Other lack of coordination: Secondary | ICD-10-CM

## 2017-01-05 DIAGNOSIS — R41840 Attention and concentration deficit: Secondary | ICD-10-CM

## 2017-01-05 NOTE — Therapy (Signed)
[facility] [address] [place], [place], [address] Phone: [phone]   Fax:  [phone]  Occupational Therapy Treatment  Patient Details  Name: Jill NuttingCarol Howard MRN: [id number] Date of Birth: 06-01-1940 Referring Provider (Historical): Guido Sanderonna [place], [place]   Encounter Date: 01/05/2017  OT End of Session - 01/05/17 1408    Visit Number  9    Number of Visits  17    Date for OT Re-Evaluation  01/30/17    Authorization Type  Medicare/AARP; G-code needed    Authorization - Visit Number  9    Authorization - Number of Visits  10    OT Start Time  1406    OT Stop Time  1447    OT Time Calculation (min)  41 min    Activity Tolerance  Patient tolerated treatment well    Behavior During Therapy  [facility] for tasks assessed/performed       Past Medical History:  Diagnosis Date  . Diabetes mellitus without complication (HCC)   . Hypertension   . Kidney stone   . Stroke ([facility])   . Vertigo     Past Surgical History:  Procedure Laterality Date  . ABDOMINAL HYSTERECTOMY    . KIDNEY STONE SURGERY      Vitals:   01/05/17 1407  BP: 125/70  Pulse: 79    Subjective Assessment - 01/05/17 1407    Subjective   feeling strange today, blood sugar low this morning     Patient is accompained by:  Family member    Pertinent History  vertigo, fibromyalgia, DM, HTN, hyperlipidemia, ?vascular dementia     Limitations  fall risk, R homonymous hemianopsia    Patient Stated Goals  improve balance, improve strength, improve vision, would like to be able to drive again    Currently in Pain?  Yes    Pain Score  3     Pain Location  -- headache    Pain Descriptors / Indicators  Headache    Pain Type  Acute pain    Pain Onset  Today    Pain Frequency  Intermittent    Aggravating Factors   blood sugar    Pain Relieving Factors  peanut butter         Hidden pictures for complex visual scanning and attention with min-mod difficulty  today, min cueing.  Environmental scanning in busy environment with ambulation and tossing ball between hands with good accuracy.                   OT Education - 01/05/17 1503    Education Details  updated theraband HEP to red--returned demo each x15    Person(s) Educated  Patient    Methods  Explanation;Demonstration    Comprehension  Verbalized understanding;Returned demonstration       OT Short Term Goals - 12/27/16 2156      OT SHORT TERM GOAL #1   Title  Pt will be independent with initial HEP for RUE strength.--check STGs 12/30/16    Status  Achieved      OT SHORT TERM GOAL #2   Title  Pt will verbalize understanding of visual compensation strategies.    Status  Achieved      OT SHORT TERM GOAL #3   Title  Pt will perform simple environmental scanning/navigation with at least 90% accuracy for incr safety for community activities.    Status  Achieved 12/20/16  100% accuracy      OT  SHORT TERM GOAL #4   Title  Pt will perform complex tabletop visual scanning and reading with at least 95% accuracy.    Status  Achieved        OT Long Term Goals - 12/07/16 1256      OT LONG TERM GOAL #1   Title  Pt will be independent with updated RUE strengthening HEP.--check LTGs 01/30/17    Status  On-going      OT LONG TERM GOAL #2   Title  Pt will perform environmental scanning with simple divided attention with at least 90% accuracy for improved safety for community activities.    Status  On-going      OT LONG TERM GOAL #3   Title  Pt will perform all previous home maintenance tasks independently.    Status  On-going      OT LONG TERM GOAL #4   Title  Pt will perform environmental scanning/navigation in busy environment with at least 90% accuracy for improved safety for community activities.    Status  On-going            Plan - 01/05/17 1409    Clinical Impression Statement  Pt is progressing well towards goals.  Headache today affected performance per  pt.      Rehab Potential  Good    OT Frequency  2x / week    OT Duration  8 weeks    Plan  G-code; typing, environmental scanning with divided attention/environmental scanning in busy environment    OT Home Exercise Plan  Education provided:  visual compensation strategies; yellow theraband HEP--updated to red    Consulted and Agree with Plan of Care  Patient;Family member/caregiver       Patient will benefit from skilled therapeutic intervention in order to improve the following deficits and impairments:  Impaired vision/preception, Decreased mobility, Decreased balance, Decreased endurance, Decreased cognition, Decreased coordination, Decreased strength, Decreased activity tolerance  Visit Diagnosis: Visuospatial deficit  Other lack of coordination  Muscle weakness (generalized)  Other symptoms and signs involving cognitive functions following cerebral infarction  Hemiplegia and hemiparesis following cerebral infarction affecting right dominant side (HCC)  Unsteadiness on feet  Attention and concentration deficit    Problem List Patient Active Problem List   Diagnosis Date Noted  . CVA (cerebral vascular accident) ([facility]) 10/25/2016  . 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 Essential hypertension 10/24/2016  . Diabetes mellitus with complication ([facility]) 10/24/2016  . Chronic pain 10/24/2016    [facility] HospitalFREEMAN,Jill 01/05/2017, 3:07 PM  [facility] [address] [place], [place], [address] Phone: [phone]   Fax:  [phone]  Name: Jill NuttingCarol Howard MRN: [id number] Date of Birth: 1941/01/03   Willa FraterAngela Howard, OTR/L [facility] [address] [place], [place]  [address] [phone] phone [phone] 01/05/17 3:07 PM

## 2017-01-06 ENCOUNTER — Ambulatory Visit: Payer: Medicare Other | Admitting: Physical Therapy

## 2017-01-06 ENCOUNTER — Encounter: Payer: Self-pay | Admitting: Physical Therapy

## 2017-01-06 DIAGNOSIS — R2681 Unsteadiness on feet: Secondary | ICD-10-CM

## 2017-01-06 DIAGNOSIS — R42 Dizziness and giddiness: Secondary | ICD-10-CM

## 2017-01-06 DIAGNOSIS — I69351 Hemiplegia and hemiparesis following cerebral infarction affecting right dominant side: Secondary | ICD-10-CM | POA: Diagnosis not present

## 2017-01-06 NOTE — Therapy (Signed)
[facility] [address] [place], [place], [address] Phone: [phone]   Fax:  [phone]  Speech Language Pathology Treatment  Patient Details  Name: Jill Howard MRN: [id number] Date of Birth: 1940/11/20 Referring Provider: Guido Sanderonna Jean King PA-C   Encounter Date: 01/05/2017  End of Session - 01/05/17 1317    Visit Number  5    Number of Visits  17    Date for SLP Re-Evaluation  02/03/17    SLP Start Time  1317    SLP Stop Time   1400    SLP Time Calculation (min)  43 min    Activity Tolerance  Patient tolerated treatment well       Past Medical History:  Diagnosis Date  . Diabetes mellitus without complication (HCC)   . Hypertension   . Kidney stone   . Stroke (HCC)   . Vertigo     Past Surgical History:  Procedure Laterality Date  . ABDOMINAL HYSTERECTOMY    . KIDNEY STONE SURGERY      There were no vitals filed for this visit.  Subjective Assessment - 01/05/17 1319    Subjective  "When I read it I said I know what all this is and then it was a total blank." (re: naming items in a category)    Currently in Pain?  No/denies    Pain Score  4     Pain Location  Head    Pain Descriptors / Indicators  Headache    Pain Type  Acute pain    Aggravating Factors   blood sugar    Pain Relieving Factors  peanut butter    Multiple Pain Sites  No            ADULT SLP TREATMENT - 01/05/17 1317      General Information   Behavior/Cognition  Alert;Cooperative;Pleasant mood      Treatment Provided   Treatment provided  Cognitive-Linquistic      Cognitive-Linquistic Treatment   Treatment focused on  Aphasia    Skilled Treatment SLP reviewed pt's homework for naming items in a concrete category. Facilitated naming for concrete categories ranging from simple to mod complex categories. For simple categories, pt able to name 10+ items with extended time. For moderately complex or less common categories,  pt required mod-max A (descriptions, first letter and phonemic cues) after an average of 6.5 items. Pt named 3 strategies to compensate for anomia/aphasia. SLP facilitated use of strategies in simple conversation with occasional min A.        Assessment / Recommendations / Plan   Plan  Continue with current plan of care      Progression Toward Goals   Progression toward goals  Progressing toward goals         SLP Short Term Goals - 01/05/17 1317      SLP SHORT TERM GOAL #1   Title  Pt will name 10 items for a simple category with occasional min A over 2 sessions    Time  2    Period  Weeks    Status  On-going      SLP SHORT TERM GOAL #2   Title  Pt will write at sentnece level, ID and correct her errors with occasional min A over 2 sessions    Time  2    Period  Weeks    Status  On-going      SLP SHORT TERM GOAL #3   Title  Pt  will utilize compensations for aphasia in structured language tasks with occasional min cues over 3 sessions    Time  2    Period  Weeks    Status  On-going      SLP SHORT TERM GOAL #4   Title  Cognitive linguistic eval to be completed, goals added if indicated    Time  2    Period  Weeks    Status  On-going       SLP Long Term Goals - 01/05/17 1317      SLP LONG TERM GOAL #1   Title  Pt will name 8 items in a mildly complex category with occasional min A over 2 sessions    Time  6    Period  Weeks    Status  On-going      SLP LONG TERM GOAL #2   Title  Pt will write 2-3 sentnece paragraph, correcting her errors, with occasional min A over 2 sessions    Time  6    Period  Weeks    Status  On-going      SLP LONG TERM GOAL #3   Title  Pt will utilize compensations for aphasia over 12 minute milldy complex conversation with 2 or less requests for repair/clarification by ST over 3 sessions    Time  6    Period  Weeks    Status  On-going       Plan - 01/06/17 0809    Clinical Impression Statement  Pt presents today with impairment in  specific word finding tasks (divergent, moreso with less-common or min-mod complex categories). SLP noted 2 paraphasic errors and 3 instances of anomia during extended conversation. Continued ST intervention is recommended to maximize effective communication for improved QOL. Pt would benefit from formal cognitive testing.     Speech Therapy Frequency  2x / week    Treatment/Interventions  Compensatory strategies;Patient/family education;Functional tasks;Cueing hierarchy;Environmental controls;Cognitive reorganization;Multimodal communcation approach;Language facilitation;Internal/external aids;SLP instruction and feedback    Potential to Achieve Goals  Good    Potential Considerations  Ability to learn/carryover information    Consulted and Agree with Plan of Care  Patient       Patient will benefit from skilled therapeutic intervention in order to improve the following deficits and impairments:   Aphasia    Problem List Patient Active Problem List   Diagnosis Date Noted  . CVA (cerebral vascular accident) (HCC) 10/25/2016  . 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 Essential hypertension 10/24/2016  . Diabetes mellitus with complication (HCC) 10/24/2016  . Chronic pain 10/24/2016   Rondel BatonMary Beth Sylvester Minton, MS, CCC-SLP Speech-Language Pathologist  Arlana LindauMary E Glenard Keesling 01/06/2017, 8:12 AM  [facility] [address] [place], [place], [address] Phone: [phone]   Fax:  [phone]   Name: Jill Howard MRN: [id number] Date of Birth: 07-31-1940

## 2017-01-06 NOTE — Therapy (Signed)
[facility] [address] [place], [place], [address] Phone: [phone]   Fax:  [phone]  Physical Therapy Treatment  Patient Details  Name: Jill Howard MRN: [id number] Date of Birth: [birth year] Referring Provider: Lavella Hammock, [place]   Encounter Date: 01/06/2017  PT End of Session - 01/06/17 1455    Visit Number  9    Number of Visits  17    Date for PT Re-Evaluation  01/30/17    Authorization Type  Medicare/AARP G code and PN every 10th visit    PT Start Time  1448    PT Stop Time  1530    PT Time Calculation (min)  42 min    Activity Tolerance  Patient tolerated treatment well    Behavior During Therapy  [facility] for tasks assessed/performed       Past Medical History:  Diagnosis Date  . Diabetes mellitus without complication ([place])   . Hypertension   . Kidney stone   . Stroke ([place])   . Vertigo     Past Surgical History:  Procedure Laterality Date  . ABDOMINAL HYSTERECTOMY    . KIDNEY STONE SURGERY      There were no vitals filed for this visit.  Subjective Assessment - 01/06/17 1452    Subjective  No new complaints. No falls or pain to report. Does report her sugar has been dropping at random times. Reports it was 70 when she got home from therapies yesterday. It took until the evening to get it back up. Reports better readings so far today. Has spoken to her MD office and they want her keeping strict records until she see's them (on a waiting list to get appt with her MD). Was told to call back if this continues to be a problem.     Patient is accompained by:  Family member    Pertinent History  bilat knee OA, vertigo, diabetes, TIA, HTN, hyperlipidemia, fibromyalgia, vascular dementia, and migraines    Limitations  Standing;Walking    Patient Stated Goals  To improve balance to prevent a fall and gain more strength in her hand    Currently in Pain?  No/denies    Pain Score  0-No pain          [facility] Adult PT Treatment/Exercise - 01/06/17 1502      Ambulation/Gait   Ambulation/Gait  --      High Level Balance   High Level Balance Activities  Tandem walking;Marching backwards;Marching forwards tandem/heel/toe walking fwd/bwd    High Level Balance Comments  on both red mats with no UE support.: 3 laps each/each way with min guard to min assist for balance.       Neuro Re-ed    Neuro Re-ed Details   wide stand on open blue mat: with ball clasped in both hands pt performed diagonal motions while tracking ball, alternating sides. x 3 each way with no dizziness reported. min assist for balance.           Balance Exercises - 01/06/17 1525      Balance Exercises: Standing   Rockerboard  Anterior/posterior;Lateral;Head turns;EO;30 seconds;10 reps;Intermittent UE support      Balance Exercises: Standing   Rebounder Limitations  performed both ways on balance board: no UE support to intermittent touch to bars for balance when dizziness occured- EC no head movments, progressing to EC head movements left<>right, up<>down and diagonals both ways. When pt reported mild dizziness with head movments, light fingertip touch  to bars resolved this and pt was able to continued. pt needed min gaurd to  min assist for balance, along with cues on posture and weight shifing to assist with balance.           PT Short Term Goals - 12/29/16 0834      PT SHORT TERM GOAL #1   Title  Pt will participate in further balance and endurance testing - 6 min walk, FGA    Baseline  see LTGs    Status  Achieved    Target Date  12/31/16      PT SHORT TERM GOAL #2   Title  Pt will participate in further vestibular assessment     Baseline  met 11/30    Time  4    Period  Weeks    Status  Achieved    Target Date  12/31/16      PT SHORT TERM GOAL #3   Title  Pt will improve gait velocity to > or = 3.8 ft/sec    Baseline  12/5: 3.6 ft/sec    Time  4    Period  Weeks    Status  Not Met     Target Date  12/31/16      PT SHORT TERM GOAL #4   Title  Will negotiate 2 stairs x 3 reps, no rails, alternating sequence with min A    Baseline  12/5: Pt demonstrated alt. pattern with supervision but required one rail.     Time  4    Period  Weeks    Status  Partially Met    Target Date  12/31/16        PT Long Term Goals - 12/23/16 2255      PT LONG TERM GOAL #1   Title  Pt will perform balance and strengthening HEP with husband's supervision    Time  8    Period  Weeks    Status  On-going    Target Date  01/30/17      PT LONG TERM GOAL #2   Title  Pt will improve 6 min walk test distance by 100'     Baseline  12/09/16: 1041 no AD at baseline with no rest breaks taken    Time  8    Period  Weeks    Status  On-going    Target Date  01/30/17      PT LONG TERM GOAL #3   Title  Pt will improve FGA score by 4 points from initial assessment    Baseline  12/09/16: 24/30 scored today for baseline    Time  8    Period  Weeks    Status  Revised    Target Date  01/30/17      PT LONG TERM GOAL #4   Title  Pt will improve gait velocity to > or = 4.0 ft/sec for improved safety in community    Baseline  3.5    Time  8    Period  Weeks    Status  On-going    Target Date  01/30/17      PT LONG TERM GOAL #5   Title  Pt will improve overall function (FOTO) to > or = 85%    Baseline  79%    Time  8    Period  Weeks    Status  On-going    Target Date  01/30/17      PT LONG TERM GOAL #6  Title  Ambulate 1000' over uneven outdoor surfaces/curbs with supervision and negotiate 2 stairs x 3 reps without rails, alternating sequence with supervision    Time  8    Period  Weeks    Status  On-going    Target Date  01/30/17      PT LONG TERM GOAL #7   Title  Will report returning to 50% of baseline function/activities (household activities, carrying boxes from utility building, decorating for Christmas)    Time  8    Period  Weeks    Status  On-going    Target Date  01/30/17             Plan - 01/06/17 1456    Clinical Impression Statement  Today's skilled session continued to address high level balance with only mild dizziness reported at times that resolved when pt had light UE support. Pt is making steady progress toward goals and should benefit from continued PT to progress toward unmet goals.     PT Frequency  2x / week    PT Duration  8 weeks    PT Treatment/Interventions  ADLs/Self Care Home Management;Gait training;Stair training;Functional mobility training;Therapeutic activities;Therapeutic exercise;Balance training;Neuromuscular re-education;Patient/family education;Energy conservation;Vestibular;Visual/perceptual remediation/compensation    PT Next Visit Plan G-code next visit.  Continue with balance activities and gait on compliant surfaces.   progress x 1 viewing; balance with head movements to stimulate vestibular system - pt gets most dizzy with looking up and bending down to floor.    Consulted and Agree with Plan of Care  Patient    Family Member Consulted  husband       Patient will benefit from skilled therapeutic intervention in order to improve the following deficits and impairments:  Decreased activity tolerance, Decreased balance, Decreased endurance, Decreased strength, Difficulty walking, Dizziness, Impaired UE functional use, Impaired vision/preception, Pain  Visit Diagnosis: Unsteadiness on feet  Dizziness and giddiness     Problem List Patient Active Problem List   Diagnosis Date Noted  . CVA (cerebral vascular accident) ([place]) 10/25/2016  . Stroke (cerebrum) ([place]) 10/24/2016  . Essential hypertension 10/24/2016  . Diabetes mellitus with complication ([place]) 35/45/6256  . Chronic pain 10/24/2016    Willow Ora, PTA, [facility] [address] [place], [place] [address] [phone] 01/06/17, 4:52 PM   Name: Ricci Paff MRN: [id number] Date of Birth: Nov 09, 1940

## 2017-01-09 ENCOUNTER — Ambulatory Visit: Payer: Medicare Other | Admitting: Occupational Therapy

## 2017-01-09 ENCOUNTER — Ambulatory Visit: Payer: Medicare Other | Admitting: Speech Pathology

## 2017-01-09 ENCOUNTER — Encounter: Payer: Self-pay | Admitting: Occupational Therapy

## 2017-01-09 ENCOUNTER — Other Ambulatory Visit: Payer: Self-pay

## 2017-01-09 DIAGNOSIS — R4701 Aphasia: Secondary | ICD-10-CM

## 2017-01-09 DIAGNOSIS — R41842 Visuospatial deficit: Secondary | ICD-10-CM

## 2017-01-09 DIAGNOSIS — R41840 Attention and concentration deficit: Secondary | ICD-10-CM

## 2017-01-09 DIAGNOSIS — R278 Other lack of coordination: Secondary | ICD-10-CM

## 2017-01-09 DIAGNOSIS — I69351 Hemiplegia and hemiparesis following cerebral infarction affecting right dominant side: Secondary | ICD-10-CM | POA: Diagnosis not present

## 2017-01-09 DIAGNOSIS — I69318 Other symptoms and signs involving cognitive functions following cerebral infarction: Secondary | ICD-10-CM

## 2017-01-09 DIAGNOSIS — R2681 Unsteadiness on feet: Secondary | ICD-10-CM

## 2017-01-09 DIAGNOSIS — R41841 Cognitive communication deficit: Secondary | ICD-10-CM

## 2017-01-09 NOTE — Therapy (Signed)
[facility] [address] [place], [place], [address] Phone: [phone]   Fax:  [phone]  Occupational Therapy Treatment  Patient Details  Name: Jill Howard MRN: [id number] Date of Birth: March 27, 1940 Referring Provider (Historical): Lavella Hammock, [place]   Encounter Date: 01/09/2017  OT End of Session - 01/09/17 1412    Visit Number  10    Number of Visits  17    Date for OT Re-Evaluation  01/30/17    Authorization Type  Medicare/AARP; G-code needed    Authorization - Visit Number  10    Authorization - Number of Visits  10    OT Start Time  6962    OT Stop Time  1445    OT Time Calculation (min)  42 min    Activity Tolerance  Patient tolerated treatment well    Behavior During Therapy  [facility] for tasks assessed/performed       Past Medical History:  Diagnosis Date  . Diabetes mellitus without complication ([place])   . Hypertension   . Kidney stone   . Stroke ([place])   . Vertigo     Past Surgical History:  Procedure Laterality Date  . ABDOMINAL HYSTERECTOMY    . KIDNEY STONE SURGERY      There were no vitals filed for this visit.  Subjective Assessment - 01/09/17 1404    Subjective   still has headache affecting vision (blood sugar/BP was checked today and was ok).  Has started making cookies, muffins, cake, fudge, peanut balls.  Pt is doing all previous home maintenance tasks    Patient is accompained by:  Family member    Pertinent History  vertigo, fibromyalgia, DM, HTN, hyperlipidemia, ?vascular dementia     Limitations  fall risk, R homonymous hemianopsia    Patient Stated Goals  improve balance, improve strength, improve vision, would like to be able to drive again    Currently in Pain?  Yes    Pain Score  2     Pain Location  -- headache/migraine    Pain Descriptors / Indicators  Headache    Pain Type  Acute pain    Pain Onset  In the past 7 days    Pain Frequency  Constant    Aggravating  Factors   unknown    Pain Relieving Factors  nothing, rest, incr liquids        Reviewed progress towards goals.  Pt reports migraine is affecting performance today.  Reviewed hidden picture from previous session.  Complex word search with incr time, but good performance.  Complex environmental scanning with ambulation and cognitive task for divided attention with 21/24 items located initially in busy/dynamic environment.  Then remaining found on 2nd pass with min v.c. (2/3 items missed on R side.   Arm bike x34mn level 3 for conditioning without rest (forward/backwards).                     OT Short Term Goals - 12/27/16 2156      OT SHORT TERM GOAL #1   Title  Pt will be independent with initial HEP for RUE strength.--check STGs 12/30/16    Status  Achieved      OT SHORT TERM GOAL #2   Title  Pt will verbalize understanding of visual compensation strategies.    Status  Achieved      OT SHORT TERM GOAL #3   Title  Pt will perform simple environmental scanning/navigation with at  least 90% accuracy for incr safety for community activities.    Status  Achieved 12/20/16  100% accuracy      OT SHORT TERM GOAL #4   Title  Pt will perform complex tabletop visual scanning and reading with at least 95% accuracy.    Status  Achieved        OT Long Term Goals - 02-01-2017 1416      OT LONG TERM GOAL #1   Title  Pt will be independent with updated RUE strengthening HEP.--check LTGs 01/30/17    Status  On-going      OT LONG TERM GOAL #2   Title  Pt will perform environmental scanning with simple divided attention with at least 90% accuracy for improved safety for community activities.    Status  On-going      OT LONG TERM GOAL #3   Title  Pt will perform all previous home maintenance tasks independently.    Status  Achieved 2017-02-01      OT LONG TERM GOAL #4   Title  Pt will perform environmental scanning/navigation in busy environment with at least 90% accuracy  for improved safety for community activities.    Time  8    Period  Weeks    Status  Achieved            Plan - 2017/02/01 1412    Clinical Impression Statement   Headache today affected performance per pt.  Pt is progressing well towards goals.  All STGs met and 2/4 LTGs met.  Pt would benefit from continued occupational therapy to assist in returning to prior community activities.      Rehab Potential  Good    OT Frequency  2x / week    OT Duration  8 weeks    Plan  environmental scanning with divided attention/environmental scanning in busy environment, review updated HEP    OT Home Exercise Plan  Education provided:  visual compensation strategies; yellow theraband HEP--updated to red    Consulted and Agree with Plan of Care  Patient;Family member/caregiver       Patient will benefit from skilled therapeutic intervention in order to improve the following deficits and impairments:  Impaired vision/preception, Decreased mobility, Decreased balance, Decreased endurance, Decreased cognition, Decreased coordination, Decreased strength, Decreased activity tolerance  Visit Diagnosis: Visuospatial deficit  Other lack of coordination  Other symptoms and signs involving cognitive functions following cerebral infarction  Hemiplegia and hemiparesis following cerebral infarction affecting right dominant side (HCC)  Unsteadiness on feet  Attention and concentration deficit  G-Codes - 02/01/17 1451    Functional Assessment Tool Used (Outpatient only)  pt is performing complex environmental scanning with divided attention with 88% accuracy today    Self Care Current Status (K4401)  At least 1 percent but less than 20 percent impaired, limited or restricted    Self Care Goal Status (U2725)  At least 1 percent but less than 20 percent impaired, limited or restricted       Problem List Patient Active Problem List   Diagnosis Date Noted  . CVA (cerebral vascular accident) ([place])  10/25/2016  . Stroke (cerebrum) ([place]) 10/24/2016  . Essential hypertension 10/24/2016  . Diabetes mellitus with complication ([place]) 36/64/4034  . Chronic pain 10/24/2016    Occupational Therapy Progress Note  Dates of Reporting Period: 12/01/16 to 02-01-17  Objective Reports of Subjective Statement: see above  Objective Measurements: see above  Goal Update: see above  Plan: continue with updates to strengthening HEP  prn and complex environmental scanning   Reason Skilled Services are Required: see above    [facility] 01/09/2017, 3:08 PM  [facility] [address] [place], [place], [address] Phone: [phone]   Fax:  [phone]  Name: Jill Howard MRN: [id number] Date of Birth: 05/22/1940   Vianne Bulls, OTR/L [facility] [address] [place], [place]  [address] [phone] phone [phone] 01/09/17 3:08 PM

## 2017-01-09 NOTE — Therapy (Signed)
[facility] [address] [place], [place], [address] Phone: [phone]   Fax:  [phone]  Speech Language Pathology Treatment  Patient Details  Name: Jill Howard MRN: [id number] Date of Birth: 04-29-1940 Referring Provider: Guido Sanderonna Jean King PA-C   Encounter Date: 01/09/2017  End of Session - 01/09/17 1455    Visit Number  6    Number of Visits  17    Date for SLP Re-Evaluation  02/03/17    SLP Start Time  1449    SLP Stop Time   1530    SLP Time Calculation (min)  41 min    Activity Tolerance  Patient tolerated treatment well       Past Medical History:  Diagnosis Date  . Diabetes mellitus without complication (HCC)   . Hypertension   . Kidney stone   . Stroke (HCC)   . Vertigo     Past Surgical History:  Procedure Laterality Date  . ABDOMINAL HYSTERECTOMY    . KIDNEY STONE SURGERY      There were no vitals filed for this visit.  Subjective Assessment - 01/09/17 1449    Subjective  "I didn't get to write the sentences. I had a migraine."    Currently in Pain?  Yes    Pain Score  2     Pain Location  Head    Pain Descriptors / Indicators  Headache    Pain Type  Acute pain    Pain Onset  In the past 7 days            ADULT SLP TREATMENT - 01/09/17 1449      General Information   Behavior/Cognition  Alert;Cooperative;Pleasant mood    Patient Positioning  Upright in chair      Treatment Provided   Treatment provided  Cognitive-Linquistic      Cognitive-Linquistic Treatment   Treatment focused on  Aphasia    Skilled Treatment  Pt generated simple sentences given 1 word with occasional min A for written paraphasias. Named 12 items for a simple category rare min A. SLP provided occasional min A for use of compensations for aphasia in simple conversation. Pt reports she is struggling with her memory. SLP initiated cognitive testing via CLQT; will complete in next 1-2 sessions.      Assessment / Recommendations / Plan   Plan  Continue with current plan of care      Progression Toward Goals   Progression toward goals  Progressing toward goals         SLP Short Term Goals - 01/09/17 1501      SLP SHORT TERM GOAL #1   Title  Pt will name 10 items for a simple category with occasional min A over 2 sessions    Baseline  01/05/17, 01/09/17    Time  1    Period  Weeks    Status  Achieved      SLP SHORT TERM GOAL #2   Title  Pt will write at sentence level, ID and correct her errors with occasional min A over 2 sessions    Baseline  01/09/17    Time  1    Period  Weeks    Status  On-going      SLP SHORT TERM GOAL #3   Title  Pt will utilize compensations for aphasia in structured language tasks with occasional min cues over 3 sessions    Baseline  01/05/17, 01/09/17    Time  1  Period  Weeks    Status  On-going      SLP SHORT TERM GOAL #4   Title  Cognitive linguistic eval to be completed, goals added if indicated    Baseline  CLQT begun 01/09/17    Time  1    Period  Weeks    Status  On-going       SLP Long Term Goals - 01/09/17 1704      SLP LONG TERM GOAL #1   Title  Pt will name 8 items in a mildly complex category with occasional min A over 2 sessions    Time  5    Period  Weeks    Status  On-going      SLP LONG TERM GOAL #2   Title  Pt will write 2-3 sentnece paragraph, correcting her errors, with occasional min A over 2 sessions    Time  5    Period  Weeks    Status  On-going      SLP LONG TERM GOAL #3   Title  Pt will utilize compensations for aphasia over 12 minute milldy complex conversation with 2 or less requests for repair/clarification by ST over 3 sessions    Time  5    Period  Weeks    Status  On-going       Plan - 01/09/17 1455    Clinical Impression Statement  Pt presents today with impairment in specific word finding tasks (divergent, moreso with less-common or min-mod complex categories). Paraphasic errors noted in  sentence level writing tasks. See skilled intervention for details. Continued ST intervention is recommended to maximize effective communication for improved QOL. Pt would benefit from formal cognitive testing.     Speech Therapy Frequency  2x / week    Treatment/Interventions  Compensatory strategies;Patient/family education;Functional tasks;Cueing hierarchy;Environmental controls;Cognitive reorganization;Multimodal communcation approach;Language facilitation;Internal/external aids;SLP instruction and feedback    Potential to Achieve Goals  Good    Potential Considerations  Ability to learn/carryover information    SLP Home Exercise Plan  continue worksheets    Consulted and Agree with Plan of Care  Patient       Patient will benefit from skilled therapeutic intervention in order to improve the following deficits and impairments:   Aphasia  Cognitive communication deficit    Problem List Patient Active Problem List   Diagnosis Date Noted  . CVA (cerebral vascular accident) (HCC) 10/25/2016  . 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 Essential hypertension 10/24/2016  . Diabetes mellitus with complication (HCC) 10/24/2016  . Chronic pain 10/24/2016   Rondel BatonMary Beth Devonn Giampietro, MS, CCC-SLP Speech-Language Pathologist  Arlana LindauMary E Malvern Kadlec 01/09/2017, 5:13 PM  [facility] [address] [place], [place], [address] Phone: [phone]   Fax:  [phone]   Name: Jill NuttingCarol Howard MRN: [id number] Date of Birth: [birth year]

## 2017-01-10 ENCOUNTER — Ambulatory Visit: Payer: Medicare Other | Admitting: *Deleted

## 2017-01-10 ENCOUNTER — Ambulatory Visit: Payer: Medicare Other | Admitting: Physical Therapy

## 2017-01-11 ENCOUNTER — Ambulatory Visit: Payer: Medicare Other | Admitting: Physical Therapy

## 2017-01-11 ENCOUNTER — Encounter: Payer: Self-pay | Admitting: Physical Therapy

## 2017-01-11 VITALS — BP 138/66 | HR 85

## 2017-01-11 DIAGNOSIS — R262 Difficulty in walking, not elsewhere classified: Secondary | ICD-10-CM

## 2017-01-11 DIAGNOSIS — I69351 Hemiplegia and hemiparesis following cerebral infarction affecting right dominant side: Secondary | ICD-10-CM | POA: Diagnosis not present

## 2017-01-11 DIAGNOSIS — M6281 Muscle weakness (generalized): Secondary | ICD-10-CM

## 2017-01-11 DIAGNOSIS — R2681 Unsteadiness on feet: Secondary | ICD-10-CM

## 2017-01-11 DIAGNOSIS — R42 Dizziness and giddiness: Secondary | ICD-10-CM

## 2017-01-11 NOTE — Therapy (Signed)
[facility] [address] [place], [place], [address] Phone: [phone]   Fax:  [phone]  Physical Therapy Treatment  Patient Details  Name: Jill Howard MRN: [id number] Date of Birth: March 16, 1940 Referring Provider: Lavella Hammock, [place]   Encounter Date: 01/11/2017  PT End of Session - 01/11/17 1633    Visit Number  10    Number of Visits  17    Date for PT Re-Evaluation  01/30/17    Authorization Type  Medicare/AARP G code and PN every 10th visit    PT Start Time  1403    PT Stop Time  1449    PT Time Calculation (min)  46 min    Activity Tolerance  Patient limited by pain    Behavior During Therapy  [facility] for tasks assessed/performed       Past Medical History:  Diagnosis Date  . Diabetes mellitus without complication ([place])   . Hypertension   . Kidney stone   . Stroke ([place])   . Vertigo     Past Surgical History:  Procedure Laterality Date  . ABDOMINAL HYSTERECTOMY    . KIDNEY STONE SURGERY      Vitals:   01/11/17 1409  BP: 138/66  Pulse: 85    Subjective Assessment - 01/11/17 1409    Subjective  Still reporting a headache x 2 weeks - dull pain.  Was very sensitive to light, sound and movement last Sunday with some nausea.  Does not experience increased dizziness with headaches.  Blood sugars have been better.      Patient is accompained by:  Family member    Pertinent History  bilat knee OA, vertigo, diabetes, TIA, HTN, hyperlipidemia, fibromyalgia, vascular dementia, and migraines    Limitations  Standing;Walking    Patient Stated Goals  To improve balance to prevent a fall and gain more strength in her hand    Currently in Pain?  Yes    Pain Score  5     Pain Location  Head    Pain Descriptors / Indicators  Headache    Pain Type  Chronic pain         [facility] PT Assessment - 01/11/17 1416      Standardized Balance Assessment   Standardized Balance Assessment  10 meter walk test    10  Meter Walk  9.81 or 3.34 ft/sec - pt with headache today                   Vestibular Treatment/Exercise - 01/11/17 1418      Vestibular Treatment/Exercise   Vestibular Treatment Provided  Gaze    Gaze Exercises  X1 Viewing Horizontal;X1 Viewing Vertical      X1 Viewing Horizontal   Foot Position  feet together, R/L partial tandem    Reps  3    Comments  60 seconds each      X1 Viewing Vertical   Foot Position  feet together, R/L partial tandem    Reps  3    Comments  60 seconds         Balance Exercises - 01/11/17 1445      Balance Exercises: Standing   Gait with Head Turns  Forward variety of directions, reaching up and down to floor        PT Education - 01/11/17 1633    Education provided  Yes    Education Details  increased x 1 viewing to 60 seconds each direction  Person(s) Educated  Patient    Methods  Explanation    Comprehension  Verbalized understanding       PT Short Term Goals - 12/29/16 0834      PT SHORT TERM GOAL #1   Title  Pt will participate in further balance and endurance testing - 6 min walk, FGA    Baseline  see LTGs    Status  Achieved    Target Date  12/31/16      PT SHORT TERM GOAL #2   Title  Pt will participate in further vestibular assessment     Baseline  met 11/30    Time  4    Period  Weeks    Status  Achieved    Target Date  12/31/16      PT SHORT TERM GOAL #3   Title  Pt will improve gait velocity to > or = 3.8 ft/sec    Baseline  12/5: 3.6 ft/sec    Time  4    Period  Weeks    Status  Not Met    Target Date  12/31/16      PT SHORT TERM GOAL #4   Title  Will negotiate 2 stairs x 3 reps, no rails, alternating sequence with min A    Baseline  12/5: Pt demonstrated alt. pattern with supervision but required one rail.     Time  4    Period  Weeks    Status  Partially Met    Target Date  12/31/16        PT Long Term Goals - 12/23/16 2255      PT LONG TERM GOAL #1   Title  Pt will perform balance  and strengthening HEP with husband's supervision    Time  8    Period  Weeks    Status  On-going    Target Date  01/30/17      PT LONG TERM GOAL #2   Title  Pt will improve 6 min walk test distance by 100'     Baseline  12/09/16: 1041 no AD at baseline with no rest breaks taken    Time  8    Period  Weeks    Status  On-going    Target Date  01/30/17      PT LONG TERM GOAL #3   Title  Pt will improve FGA score by 4 points from initial assessment    Baseline  12/09/16: 24/30 scored today for baseline    Time  8    Period  Weeks    Status  Revised    Target Date  01/30/17      PT LONG TERM GOAL #4   Title  Pt will improve gait velocity to > or = 4.0 ft/sec for improved safety in community    Baseline  3.5    Time  8    Period  Weeks    Status  On-going    Target Date  01/30/17      PT LONG TERM GOAL #5   Title  Pt will improve overall function (FOTO) to > or = 85%    Baseline  79%    Time  8    Period  Weeks    Status  On-going    Target Date  01/30/17      PT LONG TERM GOAL #6   Title  Ambulate 1000' over uneven outdoor surfaces/curbs with supervision and negotiate 2 stairs x 3 reps without  rails, alternating sequence with supervision    Time  8    Period  Weeks    Status  On-going    Target Date  01/30/17      PT LONG TERM GOAL #7   Title  Will report returning to 50% of baseline function/activities (household activities, carrying boxes from utility building, decorating for Christmas)    Time  8    Period  Weeks    Status  On-going    Target Date  01/30/17            Plan - 2017-02-08 1634    Clinical Impression Statement  Performed reassessment of gait velocity today for 10th visit PN; pt's gait velocity was slightly decreased today from evaluation but pt reporting significant HA and fatigue today.  Continued to address vestibular impairments; pt able to tolerate progression of x 1 viewing to 60 seconds with more narrow BOS and tolerated head turns in random  directions, looking up and to floor for ball toss during gait with supervision and only minor symptoms.  Pt is making steady progress towards goals; will continue to address impairments to decrease falls risk and maximize functional mobility independence.    PT Frequency  2x / week    PT Duration  8 weeks    PT Treatment/Interventions  ADLs/Self Care Home Management;Gait training;Stair training;Functional mobility training;Therapeutic activities;Therapeutic exercise;Balance training;Neuromuscular re-education;Patient/family education;Energy conservation;Vestibular;Visual/perceptual remediation/compensation    PT Next Visit Plan  Continue with balance activities and gait on compliant surfaces.   progress x 1 viewing; balance with head movements to stimulate vestibular system - pt gets most dizzy with looking up and bending down to floor.    Consulted and Agree with Plan of Care  Patient    Family Member Consulted  husband       Patient will benefit from skilled therapeutic intervention in order to improve the following deficits and impairments:  Decreased activity tolerance, Decreased balance, Decreased endurance, Decreased strength, Difficulty walking, Dizziness, Impaired UE functional use, Impaired vision/preception, Pain  Visit Diagnosis: Hemiplegia and hemiparesis following cerebral infarction affecting right dominant side (HCC)  Muscle weakness (generalized)  Unsteadiness on feet  Dizziness and giddiness  Difficulty in walking, not elsewhere classified   G-Codes - 02-08-17 1638    Functional Assessment Tool Used (Outpatient Only)  FOTO: 79; gait velocity 3.34 ft/sec    Functional Limitation  Mobility: Walking and moving around    Mobility: Walking and Moving Around Current Status [phone])  At least 20 percent but less than 40 percent impaired, limited or restricted    Mobility: Walking and Moving Around Goal Status [phone])  At least 1 percent but less than 20 percent impaired, limited  or restricted      Physical Therapy Progress Note  Dates of Reporting Period: 12/01/16 to 2017-02-08  Objective Reports of Subjective Statement: See impression statement above  Objective Measurements: gait velocity  Goal Update: See STG achievement; progressing towards LTG  Plan: Continue POC  Reason Skilled Services are Required: to continue to address vestibular, strength, balance and gait deficits to decrease falls risk and maximize functional mobility independence.     Problem List Patient Active Problem List   Diagnosis Date Noted  . CVA (cerebral vascular accident) ([place]) 10/25/2016  . Stroke (cerebrum) ([place]) 10/24/2016  . Essential hypertension 10/24/2016  . Diabetes mellitus with complication ([place]) 28/36/6294  . Chronic pain 10/24/2016    Rico Junker, PT, DPT 2017-02-08    4:42 PM    [facility] [address] [place], [place], [address] Phone: [phone]   Fax:  [phone]  Name: Jill Howard MRN: [id number] Date of Birth: 1940-11-30

## 2017-01-12 ENCOUNTER — Encounter: Payer: Self-pay | Admitting: Occupational Therapy

## 2017-01-12 ENCOUNTER — Other Ambulatory Visit: Payer: Self-pay

## 2017-01-12 ENCOUNTER — Ambulatory Visit: Payer: Medicare Other

## 2017-01-12 ENCOUNTER — Ambulatory Visit: Payer: Medicare Other | Admitting: Occupational Therapy

## 2017-01-12 DIAGNOSIS — R4701 Aphasia: Secondary | ICD-10-CM

## 2017-01-12 DIAGNOSIS — I69351 Hemiplegia and hemiparesis following cerebral infarction affecting right dominant side: Secondary | ICD-10-CM

## 2017-01-12 DIAGNOSIS — R278 Other lack of coordination: Secondary | ICD-10-CM

## 2017-01-12 DIAGNOSIS — R41840 Attention and concentration deficit: Secondary | ICD-10-CM

## 2017-01-12 DIAGNOSIS — I69318 Other symptoms and signs involving cognitive functions following cerebral infarction: Secondary | ICD-10-CM

## 2017-01-12 DIAGNOSIS — R2681 Unsteadiness on feet: Secondary | ICD-10-CM

## 2017-01-12 DIAGNOSIS — M6281 Muscle weakness (generalized): Secondary | ICD-10-CM

## 2017-01-12 DIAGNOSIS — R41841 Cognitive communication deficit: Secondary | ICD-10-CM

## 2017-01-12 DIAGNOSIS — R41842 Visuospatial deficit: Secondary | ICD-10-CM

## 2017-01-12 NOTE — Therapy (Signed)
[facility] [address] [place], [place], [address] Phone: [phone]   Fax:  [phone]  Occupational Therapy Treatment  Patient Details  Name: Jill NuttingCarol Viereck MRN: [id number] Date of Birth: [birth year] Referring Provider (Historical): Guido Sanderonna Jean King, [place]-C   Encounter Date: 01/12/2017  OT End of Session - 01/12/17 1410    Visit Number  11    Number of Visits  17    Date for OT Re-Evaluation  01/30/17    Authorization Type  Medicare/AARP; G-code needed    Authorization - Visit Number  11    Authorization - Number of Visits  20    OT Start Time  1405    OT Stop Time  1445    OT Time Calculation (min)  40 min    Activity Tolerance  Patient tolerated treatment well    Behavior During Therapy  [facility] for tasks assessed/performed       Past Medical History:  Diagnosis Date  . Diabetes mellitus without complication (HCC)   . Hypertension   . Kidney stone   . Stroke (HCC)   . Vertigo     Past Surgical History:  Procedure Laterality Date  . ABDOMINAL HYSTERECTOMY    . KIDNEY STONE SURGERY      There were no vitals filed for this visit.  Subjective Assessment - 01/12/17 1408    Subjective   still has headache, has called the MD    Patient is accompained by:  Family member    Pertinent History  vertigo, fibromyalgia, DM, HTN, hyperlipidemia, ?vascular dementia     Limitations  fall risk, R homonymous hemianopsia    Patient Stated Goals  improve balance, improve strength, improve vision, would like to be able to drive again    Currently in Pain?  Yes    Pain Score  2     Pain Descriptors / Indicators  Headache    Pain Type  Chronic pain    Pain Onset  In the past 7 days    Pain Frequency  Constant    Aggravating Factors   don't know     Pain Relieving Factors  rest         In sitting, functional reaching to place small pegs in vertical pegboard with RUE with good accuracy to copy design and good  coordination for incr activity tolerance.  Arm bike x6 min (forward/backward) level 3 without rest for conditioning.  Complex environmental scanning in busy environment with 100% accuracy, while ambulating and performing category generation.  Pt with min difficulty with category generation and stops while thinking.  Hidden picture activity with 2 found in 5 min and remaining issued for homework for problem-solving/visual scanning.                OT Short Term Goals - 12/27/16 2156      OT SHORT TERM GOAL #1   Title  Pt will be independent with initial HEP for RUE strength.--check STGs 12/30/16    Status  Achieved      OT SHORT TERM GOAL #2   Title  Pt will verbalize understanding of visual compensation strategies.    Status  Achieved      OT SHORT TERM GOAL #3   Title  Pt will perform simple environmental scanning/navigation with at least 90% accuracy for incr safety for community activities.    Status  Achieved 12/20/16  100% accuracy      OT SHORT TERM GOAL #4  Title  Pt will perform complex tabletop visual scanning and reading with at least 95% accuracy.    Status  Achieved        OT Long Term Goals - 01/09/17 1416      OT LONG TERM GOAL #1   Title  Pt will be independent with updated RUE strengthening HEP.--check LTGs 01/30/17    Status  On-going      OT LONG TERM GOAL #2   Title  Pt will perform environmental scanning with simple divided attention with at least 90% accuracy for improved safety for community activities.    Status  On-going      OT LONG TERM GOAL #3   Title  Pt will perform all previous home maintenance tasks independently.    Status  Achieved 01/09/17      OT LONG TERM GOAL #4   Title  Pt will perform environmental scanning/navigation in busy environment with at least 90% accuracy for improved safety for community activities.    Time  8    Period  Weeks    Status  Achieved            Plan - 01/12/17 1412    Clinical Impression  Statement  Pt is progressing well towards goals.  Pt continues to have difficulty with more complex divided attention, but is compensating well for visual deficits.    Rehab Potential  Good    OT Frequency  2x / week    OT Duration  8 weeks    Plan  environmental scanning with divided attention/environmental scanning in busy environment; review updated strengthening HEP    OT Home Exercise Plan  Education provided:  visual compensation strategies; yellow theraband HEP--updated to red    Consulted and Agree with Plan of Care  Patient;Family member/caregiver       Patient will benefit from skilled therapeutic intervention in order to improve the following deficits and impairments:  Impaired vision/preception, Decreased mobility, Decreased balance, Decreased endurance, Decreased cognition, Decreased coordination, Decreased strength, Decreased activity tolerance  Visit Diagnosis: Visuospatial deficit  Hemiplegia and hemiparesis following cerebral infarction affecting right dominant side (HCC)  Muscle weakness (generalized)  Unsteadiness on feet  Other lack of coordination  Other symptoms and signs involving cognitive functions following cerebral infarction  Attention and concentration deficit    Problem List Patient Active Problem List   Diagnosis Date Noted  . CVA (cerebral vascular accident) (HCC) 10/25/2016  . 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 Essential hypertension 10/24/2016  . Diabetes mellitus with complication (HCC) 10/24/2016  . Chronic pain 10/24/2016    [facility] CenterFREEMAN,Omara Alcon 01/12/2017, 5:36 PM  [facility] [address] [place], [place], [address] Phone: [phone]   Fax:  [phone]  Name: Jill NuttingCarol Howard MRN: [id number] Date of Birth: 03/02/1940   Willa FraterAngela Mayola Mcbain, OTR/L [facility] [address] [place], [place]  [address] [phone] phone [phone] 01/12/17  5:36 PM

## 2017-01-12 NOTE — Therapy (Signed)
[facility] [address] [place], [place], [address] Phone: [phone]   Fax:  [phone]  Speech Language Pathology Treatment  Patient Details  Name: Jill Howard MRN: [id number] Date of Birth: May 07, 1940 Referring Provider: Lavella Hammock PA-C   Encounter Date: 01/12/2017  End of Session - 01/12/17 1555    Visit Number  7    Number of Visits  17    Date for SLP Re-Evaluation  02/03/17    SLP Start Time  1319    SLP Stop Time   1401    SLP Time Calculation (min)  42 min    Activity Tolerance  Patient tolerated treatment well       Past Medical History:  Diagnosis Date  . Diabetes mellitus without complication ([place])   . Hypertension   . Kidney stone   . Stroke ([place])   . Vertigo     Past Surgical History:  Procedure Laterality Date  . ABDOMINAL HYSTERECTOMY    . KIDNEY STONE SURGERY      There were no vitals filed for this visit.  Subjective Assessment - 01/12/17 1330    Subjective  "I don't like these headaches."    Currently in Pain?  Yes    Pain Score  3     Pain Location  Head    Pain Orientation  Left    Pain Descriptors / Indicators  Headache    Pain Type  Chronic pain    Pain Onset  In the past 7 days    Pain Frequency  Constant    Aggravating Factors   don't know    Pain Relieving Factors  rest            ADULT SLP TREATMENT - 01/12/17 1333      General Information   Behavior/Cognition  Alert;Cooperative;Pleasant mood      Treatment Provided   Treatment provided  Cognitive-Linquistic      Cognitive-Linquistic Treatment   Treatment focused on  Cognition    Skilled Treatment  SLP completed the Cognitive Linguistic Quick Test (CLQT). Difficulty seen in design generation and clock drawing. Goals for cognitive lingistics will be added.      Assessment / Recommendations / Plan   Plan  Goals updated      Progression Toward Goals   Progression toward goals  Progressing toward  goals       SLP Education - 01/12/17 1555    Education provided  Yes    Education Details  deficits in cognitive linguistics    Person(s) Educated  Patient    Methods  Explanation    Comprehension  Verbalized understanding       SLP Short Term Goals - 01/12/17 1556      SLP SHORT TERM GOAL #1   Title  Pt will name 10 items for a simple category with occasional min A over 2 sessions    Status  Achieved      SLP SHORT TERM GOAL #2   Title  Pt will write at sentence level, ID and correct her errors with occasional min A over 2 sessions    Baseline  01/09/17    Time  --    Period  Weeks    Status  Partially Met and ongoing - see LTG      SLP SHORT TERM GOAL #3   Title  Pt will utilize compensations for aphasia in structured language tasks with occasional min cues over 3 sessions  Status  Achieved      SLP SHORT TERM GOAL #4   Title  Cognitive linguistic eval to be completed, goals added if indicated    Baseline  CLQT begun 01/09/17    Time  --    Period  --    Status  Achieved       SLP Long Term Goals - 01/12/17 1557      SLP LONG TERM GOAL #1   Title  Pt will name 8 items in a mildly complex category with occasional min A over 2 sessions    Time  5    Period  Weeks    Status  On-going      SLP LONG TERM GOAL #2   Title  Pt will write 2-3 sentnece paragraph, correcting her errors, with occasional min A over 2 sessions    Time  5    Period  Weeks    Status  On-going      SLP LONG TERM GOAL #3   Title  Pt will utilize compensations for aphasia over 12 minute milldy complex conversation with 2 or less requests for repair/clarification by ST over 3 sessions    Time  5    Period  Weeks    Status  On-going      SLP LONG TERM GOAL #4   Title  pt will demo skills to complete a simple executive function task with rare min A x2 sessions    Time  5    Period  Weeks    Status  New         Patient will benefit from skilled therapeutic intervention in order to  improve the following deficits and impairments:   Aphasia  Cognitive communication deficit    Problem List Patient Active Problem List   Diagnosis Date Noted  . CVA (cerebral vascular accident) ([place]) 10/25/2016  . Stroke (cerebrum) ([place]) 10/24/2016  . Essential hypertension 10/24/2016  . Diabetes mellitus with complication ([place]) 60/10/9321  . Chronic pain 10/24/2016    [facility] ,MS, CCC-SLP  01/12/2017, 3:59 PM  [facility] [address] [place], [place], [address] Phone: [phone]   Fax:  [phone]   Name: Jill Howard MRN: [id number] Date of Birth: 12-14-1940

## 2017-01-12 NOTE — Patient Instructions (Signed)
  Please complete the assigned speech therapy homework prior to your next session and return it to the speech therapist at your next visit.  

## 2017-01-18 ENCOUNTER — Encounter: Payer: Medicare Other | Admitting: Occupational Therapy

## 2017-01-19 ENCOUNTER — Encounter: Payer: Self-pay | Admitting: Physical Therapy

## 2017-01-19 ENCOUNTER — Encounter: Payer: Self-pay | Admitting: Occupational Therapy

## 2017-01-19 ENCOUNTER — Other Ambulatory Visit: Payer: Self-pay

## 2017-01-19 ENCOUNTER — Ambulatory Visit: Payer: Medicare Other

## 2017-01-19 ENCOUNTER — Ambulatory Visit: Payer: Medicare Other | Admitting: Physical Therapy

## 2017-01-19 ENCOUNTER — Ambulatory Visit: Payer: Medicare Other | Admitting: Occupational Therapy

## 2017-01-19 DIAGNOSIS — R41841 Cognitive communication deficit: Secondary | ICD-10-CM

## 2017-01-19 DIAGNOSIS — R4701 Aphasia: Secondary | ICD-10-CM

## 2017-01-19 DIAGNOSIS — R2681 Unsteadiness on feet: Secondary | ICD-10-CM

## 2017-01-19 DIAGNOSIS — R42 Dizziness and giddiness: Secondary | ICD-10-CM

## 2017-01-19 DIAGNOSIS — I69318 Other symptoms and signs involving cognitive functions following cerebral infarction: Secondary | ICD-10-CM

## 2017-01-19 DIAGNOSIS — R41842 Visuospatial deficit: Secondary | ICD-10-CM

## 2017-01-19 DIAGNOSIS — I69351 Hemiplegia and hemiparesis following cerebral infarction affecting right dominant side: Secondary | ICD-10-CM | POA: Diagnosis not present

## 2017-01-19 DIAGNOSIS — R41840 Attention and concentration deficit: Secondary | ICD-10-CM

## 2017-01-19 DIAGNOSIS — M6281 Muscle weakness (generalized): Secondary | ICD-10-CM

## 2017-01-19 DIAGNOSIS — R262 Difficulty in walking, not elsewhere classified: Secondary | ICD-10-CM

## 2017-01-19 DIAGNOSIS — R278 Other lack of coordination: Secondary | ICD-10-CM

## 2017-01-19 NOTE — Therapy (Signed)
[facility] [address] [place], [place], [address] Phone: [phone]   Fax:  [phone]  Occupational Therapy Treatment  Patient Details  Name: Jill Howard MRN: [id number] Date of Birth: January 10, 1941 Referring Provider (Historical): Guido Sanderonna [place], [place]   Encounter Date: 01/19/2017  OT End of Session - 01/19/17 1551    Visit Number  12    Number of Visits  17    Date for OT Re-Evaluation  01/30/17    Authorization Type  Medicare/AARP; G-code needed    Authorization - Visit Number  12    Authorization - Number of Visits  20    OT Start Time  1537    OT Stop Time  1617    OT Time Calculation (min)  40 min    Activity Tolerance  Patient tolerated treatment well    Behavior During Therapy  [facility]FL for tasks assessed/performed       Past Medical History:  Diagnosis Date  . Diabetes mellitus without complication (HCC)   . Hypertension   . Kidney stone   . Stroke ([facility])   . Vertigo     Past Surgical History:  Procedure Laterality Date  . ABDOMINAL HYSTERECTOMY    . KIDNEY STONE SURGERY      There were no vitals filed for this visit.  Subjective Assessment - 01/19/17 1550    Patient is accompained by:  Family member    Pertinent History  vertigo, fibromyalgia, DM, HTN, hyperlipidemia, ?vascular dementia     Limitations  fall risk, R homonymous hemianopsia    Patient Stated Goals  improve balance, improve strength, improve vision, would like to be able to drive again    Currently in Pain?  No/denies    Pain Onset  In the past 7 days        Reviewed red theraband HEP and pt returned demo each x15 with intermittent min v.c. For proper positioning.  Complex environmental scanning in busy environment with 100% accuracy, while ambulating and performing category generation.  Pt with min difficulty with category generation and stops while thinking, but good performance overall.  Functional reaching with RUE to  place small pegs to copy design with min incr time, 100% accuracy for incr coordination, visual scanning, and activity tolerance.  Reviewed Hidden picture activity issued last session for homework for problem-solving/visual scanning with good accuracy.   Discussed progress and anticipated d/c next week.               OT Short Term Goals - 12/27/16 2156      OT SHORT TERM GOAL #1   Title  Pt will be independent with initial HEP for RUE strength.--check STGs 12/30/16    Status  Achieved      OT SHORT TERM GOAL #2   Title  Pt will verbalize understanding of visual compensation strategies.    Status  Achieved      OT SHORT TERM GOAL #3   Title  Pt will perform simple environmental scanning/navigation with at least 90% accuracy for incr safety for community activities.    Status  Achieved 12/20/16  100% accuracy      OT SHORT TERM GOAL #4   Title  Pt will perform complex tabletop visual scanning and reading with at least 95% accuracy.    Status  Achieved        OT Long Term Goals - 01/09/17 1416      OT LONG TERM GOAL #1   Title  Pt will be independent with updated RUE strengthening HEP.--check LTGs 01/30/17    Status  On-going      OT LONG TERM GOAL #2   Title  Pt will perform environmental scanning with simple divided attention with at least 90% accuracy for improved safety for community activities.    Status  On-going      OT LONG TERM GOAL #3   Title  Pt will perform all previous home maintenance tasks independently.    Status  Achieved 01/09/17      OT LONG TERM GOAL #4   Title  Pt will perform environmental scanning/navigation in busy environment with at least 90% accuracy for improved safety for community activities.    Time  8    Period  Weeks    Status  Achieved            Plan - 01/19/17 1552    Clinical Impression Statement  Pt continues to progress well towards goals.  Pt continues to have some difficulty with more complex divided attention, but  is compensating well for visual deficits.    Rehab Potential  Good    OT Frequency  2x / week    OT Duration  8 weeks    Plan  begin checking rmaining goals (anticipate d/c next week); environmental scanning with divided attention/environmental scanning in busy environment    OT Home Exercise Plan  Education provided:  visual compensation strategies; yellow theraband HEP--updated to red    Consulted and Agree with Plan of Care  Patient;Family member/caregiver       Patient will benefit from skilled therapeutic intervention in order to improve the following deficits and impairments:  Impaired vision/preception, Decreased mobility, Decreased balance, Decreased endurance, Decreased cognition, Decreased coordination, Decreased strength, Decreased activity tolerance  Visit Diagnosis: Hemiplegia and hemiparesis following cerebral infarction affecting right dominant side (HCC)  Unsteadiness on feet  Other lack of coordination  Visuospatial deficit  Other symptoms and signs involving cognitive functions following cerebral infarction  Attention and concentration deficit    Problem List Patient Active Problem List   Diagnosis Date Noted  . CVA (cerebral vascular accident) (H[facility]) 10/25/2016  . 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 Essential hypertension 10/24/2016  . Diabetes mellitus with complication (H[facility]) 10/24/2016  . Chronic pain 10/24/2016    [facility] LL[facility]REEMAN,ANGELA 01/19/2017, 4:01 PM  [facility] [address] [place], [place], [address] Phone: [phone]   Fax:  [phone]  Name: Jill Howard MRN: [id number] Date of Birth: October 30, 1940   Willa FraterAngela Freeman, OTR/L [facility] [address] [place], [place]  [address] [phone] phone [phone] 01/19/17 4:01 PM

## 2017-01-19 NOTE — Patient Instructions (Signed)
  Please complete the assigned speech therapy homework prior to your next session and return it to the speech therapist at your next visit.  

## 2017-01-19 NOTE — Therapy (Signed)
[facility] [address] [place], [place], [address] Phone: [phone]   Fax:  [phone]  Speech Language Pathology Treatment  Patient Details  Name: Jill Howard MRN: [id number] Date of Birth: 1940-12-02 Referring Provider: Lavella Hammock PA-C   Encounter Date: 01/19/2017  End of Session - 01/19/17 1354    Visit Number  8    Number of Visits  17    Date for SLP Re-Evaluation  02/03/17    SLP Start Time  1319    SLP Stop Time   1400    SLP Time Calculation (min)  41 min    Activity Tolerance  Patient tolerated treatment well       Past Medical History:  Diagnosis Date  . Diabetes mellitus without complication ([place])   . Hypertension   . Kidney stone   . Stroke ([place])   . Vertigo     Past Surgical History:  Procedure Laterality Date  . ABDOMINAL HYSTERECTOMY    . KIDNEY STONE SURGERY      There were no vitals filed for this visit.  Subjective Assessment - 01/19/17 1322    Subjective  Pt provided homework witout SLP asking.            ADULT SLP TREATMENT - 01/19/17 1326      General Information   Behavior/Cognition  Alert;Cooperative;Pleasant mood      Treatment Provided   Treatment provided  Cognitive-Linquistic      Cognitive-Linquistic Treatment   Treatment focused on  Cognition    Skilled Treatment  SLP corrected pt's homework for detailed written instructions with her. Pt with one error on each that she did not note/correct, and req'd SLP cues to ID. With 6-step sequence cards, pt placed cards in order and provided viable explanation for her sequence with excellent success. Homework provided to pt.      Assessment / Recommendations / Plan   Plan  Continue with current plan of care      Progression Toward Goals   Progression toward goals  Progressing toward goals         SLP Short Term Goals - 01/12/17 1556      SLP SHORT TERM GOAL #1   Title  Pt will name 10 items for a  simple category with occasional min A over 2 sessions    Status  Achieved      SLP SHORT TERM GOAL #2   Title  Pt will write at sentence level, ID and correct her errors with occasional min A over 2 sessions    Baseline  01/09/17    Time  --    Period  Weeks    Status  Partially Met and ongoing - see LTG      SLP SHORT TERM GOAL #3   Title  Pt will utilize compensations for aphasia in structured language tasks with occasional min cues over 3 sessions    Status  Achieved      SLP SHORT TERM GOAL #4   Title  Cognitive linguistic eval to be completed, goals added if indicated    Baseline  CLQT begun 01/09/17    Time  --    Period  --    Status  Achieved       SLP Long Term Goals - 01/19/17 1358      SLP LONG TERM GOAL #1   Title  Pt will name 8 items in a mildly complex category with occasional min A over  2 sessions    Time  4    Period  Weeks    Status  On-going      SLP LONG TERM GOAL #2   Title  Pt will write 2-3 sentnece paragraph, correcting her errors, with occasional min A over 2 sessions    Time  4    Period  Weeks    Status  On-going      SLP LONG TERM GOAL #3   Title  Pt will utilize compensations for aphasia over 12 minute milldy complex conversation with 2 or less requests for repair/clarification by ST over 3 sessions    Time  4    Period  Weeks    Status  On-going      SLP LONG TERM GOAL #4   Title  pt will demo skills to complete a simple executive function task with rare min A x2 sessions    Time  4    Period  Weeks    Status  On-going       Plan - 01/19/17 1355    Clinical Impression Statement  Pt presents today with impairment in specific word finding tasks (divergent, moreso with less-common or min-mod complex categories). Paraphasic errors x1 noted in conversation. See skilled intervention for details. Continued ST intervention is recommended to maximize effective communication for improved QOL. Pt would benefit from formal cognitive testing.      Speech Therapy Frequency  2x / week    Treatment/Interventions  Compensatory strategies;Patient/family education;Functional tasks;Cueing hierarchy;Environmental controls;Cognitive reorganization;Multimodal communcation approach;Language facilitation;Internal/external aids;SLP instruction and feedback    Potential to Achieve Goals  Good    Potential Considerations  Ability to learn/carryover information    SLP Home Exercise Plan  continue worksheets    Consulted and Agree with Plan of Care  Patient       Patient will benefit from skilled therapeutic intervention in order to improve the following deficits and impairments:   Aphasia  Cognitive communication deficit    Problem List Patient Active Problem List   Diagnosis Date Noted  . CVA (cerebral vascular accident) ([place]) 10/25/2016  . Stroke (cerebrum) ([place]) 10/24/2016  . Essential hypertension 10/24/2016  . Diabetes mellitus with complication ([place]) 22/63/3354  . Chronic pain 10/24/2016    [facility] ,MS, CCC-SLP  01/19/2017, 2:02 PM  [facility] [address] [place], [place], [address] Phone: [phone]   Fax:  [phone]   Name: Jill Howard MRN: [id number] Date of Birth: 1941/01/13

## 2017-01-19 NOTE — Therapy (Signed)
[facility] [address] [place], [place], [address] Phone: [phone]   Fax:  [phone]  Physical Therapy Treatment  Patient Details  Name: Jill Howard MRN: [id number] Date of Birth: October 11, 1940 Referring Provider: Lavella Hammock, [place]   Encounter Date: 01/19/2017  PT End of Session - 01/19/17 1411    Visit Number  11    Number of Visits  17    Date for PT Re-Evaluation  01/30/17    Authorization Type  Medicare/AARP G code and PN every 10th visit    PT Start Time  1405    PT Stop Time  1445    PT Time Calculation (min)  40 min    Equipment Utilized During Treatment  Gait belt    Activity Tolerance  Patient tolerated treatment well;No increased pain    Behavior During Therapy  WFL for tasks assessed/performed       Past Medical History:  Diagnosis Date  . Diabetes mellitus without complication ([place])   . Hypertension   . Kidney stone   . Stroke ([place])   . Vertigo     Past Surgical History:  Procedure Laterality Date  . ABDOMINAL HYSTERECTOMY    . KIDNEY STONE SURGERY      There were no vitals filed for this visit.  Subjective Assessment - 01/19/17 1406    Subjective  No new complaints. No falls. Reports dizziness is better. Headache comes and goes. No pain, does report an "aggravation" of her left thumb as he hyperextended it yesterday when she hit it.     Patient is accompained by:  Family member    Pertinent History  bilat knee OA, vertigo, diabetes, TIA, HTN, hyperlipidemia, fibromyalgia, vascular dementia, and migraines    Limitations  Standing;Walking    Patient Stated Goals  To improve balance to prevent a fall and gain more strength in her hand    Currently in Pain?  No/denies    Pain Score  0-No pain           [facility] Adult PT Treatment/Exercise - 01/19/17 1423      Neuro Re-ed    Neuro Re-ed Details   gait while tossing hankerchief up and catching it when it comes down. allternating  hands and tracking it with eyes x 200 feet with min gaurd to min assist for balance, 4/10 dizziness afterwards that resolved with seated rest break;       Vestibular Treatment/Exercise - 01/19/17 0001      X1 Viewing Horizontal   Foot Position  feet together on solid floor, feet apart on blue air ex    Comments  60 sec x1 on floor, 30 sec x 3 on airex      X1 Viewing Vertical   Foot Position  feet together on floor, feet apart on blue airex    Comments  60 seconds x1 on floor, 30 sec's x 3 on airex         Balance Exercises - 01/19/17 1430      Balance Exercises: Standing   Balance Beam  standing across red beam no UE support: alt fwd stepping to floor/back on, then alt stepping backward to floor/back on, 10 reps each bil LE's with min gaurd assist. cues on step length and weight shifitng.    Other Standing Exercises  standing on inverted BOSU no UE support: rocking fwd/bwd, then laterally. then holding it steady- EC no head movements, progressing to EC head movements left<>right and up<>down. min  to mod assist for balance with cues on posture and weight shifting for balance assistance.           PT Short Term Goals - 12/29/16 0834      PT SHORT TERM GOAL #1   Title  Pt will participate in further balance and endurance testing - 6 min walk, FGA    Baseline  see LTGs    Status  Achieved    Target Date  12/31/16      PT SHORT TERM GOAL #2   Title  Pt will participate in further vestibular assessment     Baseline  met 11/30    Time  4    Period  Weeks    Status  Achieved    Target Date  12/31/16      PT SHORT TERM GOAL #3   Title  Pt will improve gait velocity to > or = 3.8 ft/sec    Baseline  12/5: 3.6 ft/sec    Time  4    Period  Weeks    Status  Not Met    Target Date  12/31/16      PT SHORT TERM GOAL #4   Title  Will negotiate 2 stairs x 3 reps, no rails, alternating sequence with min A    Baseline  12/5: Pt demonstrated alt. pattern with supervision but  required one rail.     Time  4    Period  Weeks    Status  Partially Met    Target Date  12/31/16        PT Long Term Goals - 12/23/16 2255      PT LONG TERM GOAL #1   Title  Pt will perform balance and strengthening HEP with husband's supervision    Time  8    Period  Weeks    Status  On-going    Target Date  01/30/17      PT LONG TERM GOAL #2   Title  Pt will improve 6 min walk test distance by 100'     Baseline  12/09/16: 1041 no AD at baseline with no rest breaks taken    Time  8    Period  Weeks    Status  On-going    Target Date  01/30/17      PT LONG TERM GOAL #3   Title  Pt will improve FGA score by 4 points from initial assessment    Baseline  12/09/16: 24/30 scored today for baseline    Time  8    Period  Weeks    Status  Revised    Target Date  01/30/17      PT LONG TERM GOAL #4   Title  Pt will improve gait velocity to > or = 4.0 ft/sec for improved safety in community    Baseline  3.5    Time  8    Period  Weeks    Status  On-going    Target Date  01/30/17      PT LONG TERM GOAL #5   Title  Pt will improve overall function (FOTO) to > or = 85%    Baseline  79%    Time  8    Period  Weeks    Status  On-going    Target Date  01/30/17      PT LONG TERM GOAL #6   Title  Ambulate 1000' over uneven outdoor surfaces/curbs with supervision and negotiate 2 stairs x 3  reps without rails, alternating sequence with supervision    Time  8    Period  Weeks    Status  On-going    Target Date  01/30/17      PT LONG TERM GOAL #7   Title  Will report returning to 50% of baseline function/activities (household activities, carrying boxes from utility building, decorating for Christmas)    Time  8    Period  Weeks    Status  On-going    Target Date  01/30/17            Plan - 01/19/17 1411    Clinical Impression Statement  Today's skilled session continued to address high level balance activities with minimal dizziness at times reported that resolved  with rest breaks. Pt is progressing toward goals and should benefit from continued PT to progress toward unmet goals    PT Frequency  2x / week    PT Duration  8 weeks    PT Treatment/Interventions  ADLs/Self Care Home Management;Gait training;Stair training;Functional mobility training;Therapeutic activities;Therapeutic exercise;Balance training;Neuromuscular re-education;Patient/family education;Energy conservation;Vestibular;Visual/perceptual remediation/compensation    PT Next Visit Plan  Continue with balance activities and gait on compliant surfaces.   progress x 1 viewing; balance with head movements to stimulate vestibular system - pt gets most dizzy with looking up and bending down to floor.    Consulted and Agree with Plan of Care  Patient    Family Member Consulted  husband       Patient will benefit from skilled therapeutic intervention in order to improve the following deficits and impairments:  Decreased activity tolerance, Decreased balance, Decreased endurance, Decreased strength, Difficulty walking, Dizziness, Impaired UE functional use, Impaired vision/preception, Pain  Visit Diagnosis: Hemiplegia and hemiparesis following cerebral infarction affecting right dominant side (HCC)  Muscle weakness (generalized)  Unsteadiness on feet  Dizziness and giddiness  Difficulty in walking, not elsewhere classified     Problem List Patient Active Problem List   Diagnosis Date Noted  . CVA (cerebral vascular accident) ([place]) 10/25/2016  . Stroke (cerebrum) ([place]) 10/24/2016  . Essential hypertension 10/24/2016  . Diabetes mellitus with complication ([place]) 09/60/4540  . Chronic pain 10/24/2016    Willow Ora, PTA, [facility] [address] [place], [place] [address] [phone] 01/20/17, 10:19 AM   Name: Arlita Buffkin MRN: [id number] Date of Birth: 1940-11-12

## 2017-01-20 ENCOUNTER — Encounter: Payer: Self-pay | Admitting: Physical Therapy

## 2017-01-20 ENCOUNTER — Ambulatory Visit: Payer: Medicare Other

## 2017-01-20 ENCOUNTER — Ambulatory Visit: Payer: Medicare Other | Admitting: Physical Therapy

## 2017-01-20 DIAGNOSIS — R4701 Aphasia: Secondary | ICD-10-CM

## 2017-01-20 DIAGNOSIS — R41841 Cognitive communication deficit: Secondary | ICD-10-CM

## 2017-01-20 DIAGNOSIS — R262 Difficulty in walking, not elsewhere classified: Secondary | ICD-10-CM

## 2017-01-20 DIAGNOSIS — R42 Dizziness and giddiness: Secondary | ICD-10-CM

## 2017-01-20 DIAGNOSIS — M6281 Muscle weakness (generalized): Secondary | ICD-10-CM

## 2017-01-20 DIAGNOSIS — I69351 Hemiplegia and hemiparesis following cerebral infarction affecting right dominant side: Secondary | ICD-10-CM | POA: Diagnosis not present

## 2017-01-20 DIAGNOSIS — R2681 Unsteadiness on feet: Secondary | ICD-10-CM

## 2017-01-20 NOTE — Therapy (Signed)
[facility] [address] [place], [place], [address] Phone: [phone]   Fax:  [phone]  Speech Language Pathology Treatment  Patient Details  Name: Jill Howard MRN: [id number] Date of Birth: 08-Aug-1940 Referring Provider: Lavella Hammock PA-C   Encounter Date: 01/20/2017  End of Session - 01/20/17 1508    Visit Number  9    Number of Visits  17    Date for SLP Re-Evaluation  02/03/17    SLP Start Time  2992    SLP Stop Time   1446    SLP Time Calculation (min)  43 min    Activity Tolerance  Patient tolerated treatment well       Past Medical History:  Diagnosis Date  . Diabetes mellitus without complication ([place])   . Hypertension   . Kidney stone   . Stroke ([place])   . Vertigo     Past Surgical History:  Procedure Laterality Date  . ABDOMINAL HYSTERECTOMY    . KIDNEY STONE SURGERY      There were no vitals filed for this visit.  Subjective Assessment - 01/20/17 1411    Subjective  Pt provided homework witout SLP asking.    Currently in Pain?  No/denies            ADULT SLP TREATMENT - 01/20/17 1411      General Information   Behavior/Cognition  Alert;Cooperative;Pleasant mood      Treatment Provided   Treatment provided  Cognitive-Linquistic      Cognitive-Linquistic Treatment   Treatment focused on  Cognition    Skilled Treatment  SLP worked with pt on alternating attention, attention to detail, and emergent awareness with grocery list. Pt first attempt req'd SLP cues to look more closely at her list for specific items pt chose the alternate for her shopping. (e.g., chose to buy granny smith instead of what her list stated-[employer]). Pt req'd verbal and visual cues for error awareness. Pt's alternating attention made this task more difficult in that pt missed sequential confirmation of items, requirng initial occasional cues faded to indpendent. Pt problem solving due to overspending was  completed with extra time and rare min SLP verbal cues.  Reduced executive function skills seen with initial fulfillment of the list, as pt waited until the very end to add up her bill, instead of estimating as she neared the end of the list.      Assessment / Recommendations / Plan   Plan  Continue with current plan of care      Progression Toward Goals   Progression toward goals  Progressing toward goals         SLP Short Term Goals - 01/12/17 1556      SLP SHORT TERM GOAL #1   Title  Pt will name 10 items for a simple category with occasional min A over 2 sessions    Status  Achieved      SLP [place] #2   Title  Pt will write at sentence level, ID and correct her errors with occasional min A over 2 sessions    Baseline  01/09/17    Time  --    Period  Weeks    Status  Partially Met and ongoing - see LTG      SLP SHORT TERM GOAL #3   Title  Pt will utilize compensations for aphasia in structured language tasks with occasional min cues over 3 sessions    Status  Achieved      SLP SHORT TERM GOAL #4   Title  Cognitive linguistic eval to be completed, goals added if indicated    Baseline  CLQT begun 01/09/17    Time  --    Period  --    Status  Achieved       SLP Long Term Goals - 01/20/17 1509      SLP LONG TERM GOAL #1   Title  Pt will name 8 items in a mildly complex category with occasional min A over 2 sessions    Time  4    Period  Weeks    Status  On-going      SLP LONG TERM GOAL #2   Title  Pt will write 2-3 sentnece paragraph, correcting her errors, with occasional min A over 2 sessions    Time  4    Period  Weeks    Status  On-going      SLP LONG TERM GOAL #3   Title  Pt will utilize compensations for aphasia over 12 minute milldy complex conversation with 2 or less requests for repair/clarification by ST over 3 sessions    Time  4    Period  Weeks    Status  On-going      SLP LONG TERM GOAL #4   Title  pt will demo skills to complete a  simple executive function task with rare min A x2 sessions    Time  4    Period  Weeks    Status  On-going       Plan - 01/20/17 1508    Clinical Impression Statement  Pt presents today with impairment in specific word finding tasks (divergent, moreso with less-common or min-mod complex categories), and in cognitive linguistics. Paraphasic errors not noted in conversation. See skilled intervention for details. Continued ST intervention is recommended to maximize effective communication for improved QOL. Pt would benefit from formal cognitive testing.     Speech Therapy Frequency  2x / week    Treatment/Interventions  Compensatory strategies;Patient/family education;Functional tasks;Cueing hierarchy;Environmental controls;Cognitive reorganization;Multimodal communcation approach;Language facilitation;Internal/external aids;SLP instruction and feedback    Potential to Achieve Goals  Good    Potential Considerations  Ability to learn/carryover information    SLP Home Exercise Plan  continue worksheets    Consulted and Agree with Plan of Care  Patient       Patient will benefit from skilled therapeutic intervention in order to improve the following deficits and impairments:   Cognitive communication deficit  Aphasia    Problem List Patient Active Problem List   Diagnosis Date Noted  . CVA (cerebral vascular accident) ([place]) 10/25/2016  . Stroke (cerebrum) ([place]) 10/24/2016  . Essential hypertension 10/24/2016  . Diabetes mellitus with complication ([place]) 17/49/4496  . Chronic pain 10/24/2016    [facility] ,[place], CCC-SLP  01/20/2017, 3:10 PM  [facility] [address] [place], [place], [address] Phone: [phone]   Fax:  [phone]   Name: Jill Howard MRN: [id number] Date of Birth: [birth year]

## 2017-01-20 NOTE — Patient Instructions (Signed)
  Please complete the assigned speech therapy homework prior to your next session and return it to the speech therapist at your next visit.  

## 2017-01-20 NOTE — Therapy (Signed)
[facility] [address] [place], [place], [address] Phone: [phone]   Fax:  [phone]  Physical Therapy Treatment  Patient Details  Name: Jill Howard MRN: [id number] Date of Birth: [birth year] Referring Provider: Lavella Hammock, [place]   Encounter Date: 01/20/2017  PT End of Session - 01/20/17 1324    Visit Number  12    Number of Visits  17    Date for PT Re-Evaluation  01/30/17    Authorization Type  Medicare/AARP G code and PN every 10th visit    PT Start Time  1319    PT Stop Time  1400    PT Time Calculation (min)  41 min    Equipment Utilized During Treatment  Gait belt    Activity Tolerance  Patient tolerated treatment well;No increased pain    Behavior During Therapy  WFL for tasks assessed/performed       Past Medical History:  Diagnosis Date  . Diabetes mellitus without complication ([place])   . Hypertension   . Kidney stone   . Stroke ([place])   . Vertigo     Past Surgical History:  Procedure Laterality Date  . ABDOMINAL HYSTERECTOMY    . KIDNEY STONE SURGERY      There were no vitals filed for this visit.  Subjective Assessment - 01/20/17 1320    Subjective  Reports her blood sugar dropped to 45 after therapies yesterday when she got home. She has eaten before hand and snacked when she got home. Reports her husband had dinner ready really quick. She took a nap after dinner and woke blurry vision. She went back to sleep and her vision was worse. Did not think to check it the 1st time due to she's had vision issues before and attributed it to that. Was 54 at that time. She has already take her meds prior to this, so she ate some apples and peanut butter while monitoring her sugars through out. Reports it was slow to increase. No issues today. No headache today.     Patient is accompained by:  Family member    Pertinent History  bilat knee OA, vertigo, diabetes, TIA, HTN, hyperlipidemia,  fibromyalgia, vascular dementia, and migraines    Limitations  Standing;Walking    Patient Stated Goals  To improve balance to prevent a fall and gain more strength in her hand    Currently in Pain?  No/denies    Pain Score  0-No pain          [facility] Adult PT Treatment/Exercise - 01/20/17 1328      Ambulation/Gait   Ambulation/Gait  Yes    Ambulation/Gait Assistance  4: Min guard;4: Min assist    Assistive device  None    Gait Pattern  Step-through pattern;Decreased arm swing - right;Decreased arm swing - left;Decreased trunk rotation    Ambulation Surface  Level;Indoor    Gait Comments  ~50 foot hallway: forward gait with head movements left<>fwd<>right, then up<>fwd<>down x 4 laps each with min guard to min assist for balance. cues to maintain speed, minor veering noted. mild dizziness reported after up/down movements that cleared with rest between laps.       High Level Balance   High Level Balance Activities  Tandem walking;Braiding tandem/heel/toe walk all fwd/bwd    High Level Balance Comments  on both red mats with no UE support (occasional touch to counter for balance).: 3 laps each/each way with min guard to min assist for balance.  Vestibular Treatment/Exercise - 01/20/17 1336      X1 Viewing Horizontal   Foot Position  feet hip width apart on blue air ex    Reps  3    Comments  30 sec's each, mild dizziness on 2cd one only that resolved within 5-10 sec's      X1 Viewing Vertical   Foot Position  feet hip width apart on air ex    Reps  3    Comments  30 sec's each. mild dizziness with 1st rep only that resolved within 10-15 sec's           PT Short Term Goals - 12/29/16 0834      PT SHORT TERM GOAL #1   Title  Pt will participate in further balance and endurance testing - 6 min walk, FGA    Baseline  see LTGs    Status  Achieved    Target Date  12/31/16      PT SHORT TERM GOAL #2   Title  Pt will participate in further vestibular assessment      Baseline  met 11/30    Time  4    Period  Weeks    Status  Achieved    Target Date  12/31/16      PT SHORT TERM GOAL #3   Title  Pt will improve gait velocity to > or = 3.8 ft/sec    Baseline  12/5: 3.6 ft/sec    Time  4    Period  Weeks    Status  Not Met    Target Date  12/31/16      PT SHORT TERM GOAL #4   Title  Will negotiate 2 stairs x 3 reps, no rails, alternating sequence with min A    Baseline  12/5: Pt demonstrated alt. pattern with supervision but required one rail.     Time  4    Period  Weeks    Status  Partially Met    Target Date  12/31/16        PT Long Term Goals - 12/23/16 2255      PT LONG TERM GOAL #1   Title  Pt will perform balance and strengthening HEP with husband's supervision    Time  8    Period  Weeks    Status  On-going    Target Date  01/30/17      PT LONG TERM GOAL #2   Title  Pt will improve 6 min walk test distance by 100'     Baseline  12/09/16: 1041 no AD at baseline with no rest breaks taken    Time  8    Period  Weeks    Status  On-going    Target Date  01/30/17      PT LONG TERM GOAL #3   Title  Pt will improve FGA score by 4 points from initial assessment    Baseline  12/09/16: 24/30 scored today for baseline    Time  8    Period  Weeks    Status  Revised    Target Date  01/30/17      PT LONG TERM GOAL #4   Title  Pt will improve gait velocity to > or = 4.0 ft/sec for improved safety in community    Baseline  3.5    Time  8    Period  Weeks    Status  On-going    Target Date  01/30/17  PT LONG TERM GOAL #5   Title  Pt will improve overall function (FOTO) to > or = 85%    Baseline  79%    Time  8    Period  Weeks    Status  On-going    Target Date  01/30/17      PT LONG TERM GOAL #6   Title  Ambulate 1000' over uneven outdoor surfaces/curbs with supervision and negotiate 2 stairs x 3 reps without rails, alternating sequence with supervision    Time  8    Period  Weeks    Status  On-going    Target Date   01/30/17      PT LONG TERM GOAL #7   Title  Will report returning to 50% of baseline function/activities (household activities, carrying boxes from utility building, decorating for Christmas)    Time  8    Period  Weeks    Status  On-going    Target Date  01/30/17         Plan - 01/20/17 1324    Clinical Impression Statement  Today's skilled session focused on high level balance with no issues reported. Pt is making steady progress toward goals and should benefit from continued PT to progress toward unmet goals.     PT Frequency  2x / week    PT Duration  8 weeks    PT Treatment/Interventions  ADLs/Self Care Home Management;Gait training;Stair training;Functional mobility training;Therapeutic activities;Therapeutic exercise;Balance training;Neuromuscular re-education;Patient/family education;Energy conservation;Vestibular;Visual/perceptual remediation/compensation    PT Next Visit Plan  Continue with balance activities and gait on compliant surfaces.   progress x 1 viewing; balance with head movements to stimulate vestibular system - pt gets most dizzy with looking up and bending down to floor.    Consulted and Agree with Plan of Care  Patient    Family Member Consulted  husband       Patient will benefit from skilled therapeutic intervention in order to improve the following deficits and impairments:  Decreased activity tolerance, Decreased balance, Decreased endurance, Decreased strength, Difficulty walking, Dizziness, Impaired UE functional use, Impaired vision/preception, Pain  Visit Diagnosis: Unsteadiness on feet  Muscle weakness (generalized)  Dizziness and giddiness  Difficulty in walking, not elsewhere classified     Problem List Patient Active Problem List   Diagnosis Date Noted  . CVA (cerebral vascular accident) ([place]) 10/25/2016  . Stroke (cerebrum) ([place]) 10/24/2016  . Essential hypertension 10/24/2016  . Diabetes mellitus with complication ([place]) 48/18/5631  .  Chronic pain 10/24/2016    Willow Ora, PTA, [facility] [address] [place], [place] [address] [phone] 01/20/17, 7:18 PM   Name: Jill Howard MRN: [id number] Date of Birth: 09/19/1940

## 2017-01-25 ENCOUNTER — Ambulatory Visit: Payer: Medicare Other

## 2017-01-25 ENCOUNTER — Ambulatory Visit: Payer: Medicare Other | Admitting: Occupational Therapy

## 2017-01-26 ENCOUNTER — Ambulatory Visit: Payer: Medicare Other | Attending: Internal Medicine | Admitting: Physical Therapy

## 2017-01-26 ENCOUNTER — Encounter: Payer: Self-pay | Admitting: Physical Therapy

## 2017-01-26 DIAGNOSIS — R262 Difficulty in walking, not elsewhere classified: Secondary | ICD-10-CM | POA: Diagnosis present

## 2017-01-26 DIAGNOSIS — R42 Dizziness and giddiness: Secondary | ICD-10-CM

## 2017-01-26 DIAGNOSIS — R2681 Unsteadiness on feet: Secondary | ICD-10-CM | POA: Insufficient documentation

## 2017-01-26 DIAGNOSIS — R41841 Cognitive communication deficit: Secondary | ICD-10-CM | POA: Insufficient documentation

## 2017-01-26 DIAGNOSIS — R278 Other lack of coordination: Secondary | ICD-10-CM | POA: Insufficient documentation

## 2017-01-26 DIAGNOSIS — I69351 Hemiplegia and hemiparesis following cerebral infarction affecting right dominant side: Secondary | ICD-10-CM | POA: Diagnosis present

## 2017-01-26 DIAGNOSIS — M6281 Muscle weakness (generalized): Secondary | ICD-10-CM | POA: Diagnosis present

## 2017-01-26 DIAGNOSIS — R41840 Attention and concentration deficit: Secondary | ICD-10-CM | POA: Diagnosis present

## 2017-01-26 DIAGNOSIS — R4701 Aphasia: Secondary | ICD-10-CM | POA: Insufficient documentation

## 2017-01-26 DIAGNOSIS — I69318 Other symptoms and signs involving cognitive functions following cerebral infarction: Secondary | ICD-10-CM | POA: Diagnosis present

## 2017-01-26 DIAGNOSIS — R41842 Visuospatial deficit: Secondary | ICD-10-CM | POA: Diagnosis present

## 2017-01-26 NOTE — Therapy (Signed)
[facility] [address] [place], [place], [address] Phone: [phone]   Fax:  [phone]  Physical Therapy Treatment and D/C Summary  Patient Details  Name: Jill Howard MRN: [id number] Date of Birth: [birth year] Referring Provider: Lavella Hammock, [place]   Encounter Date: 01/26/2017  PT End of Session - 01/26/17 1457    Visit Number  13    Number of Visits  17    Date for PT Re-Evaluation  01/30/17    Authorization Type  Medicare/AARP     PT Start Time  1400    PT Stop Time  1445    PT Time Calculation (min)  45 min    Activity Tolerance  Patient tolerated treatment well    Behavior During Therapy  [facility] for tasks assessed/performed       Past Medical History:  Diagnosis Date  . Diabetes mellitus without complication ([place])   . Hypertension   . Kidney stone   . Stroke ([place])   . Vertigo     Past Surgical History:  Procedure Laterality Date  . ABDOMINAL HYSTERECTOMY    . KIDNEY STONE SURGERY      There were no vitals filed for this visit.  Subjective Assessment - 01/26/17 1406    Subjective  Pt's blood sugar has remained stable since last week; has been working on taking down Christmas trees.  No LOB or falls, one episode of lightheadedness after squatting down for too long.    Patient is accompained by:  Family member    Pertinent History  bilat knee OA, vertigo, diabetes, TIA, HTN, hyperlipidemia, fibromyalgia, vascular dementia, and migraines    Limitations  Standing;Walking    Patient Stated Goals  To improve balance to prevent a fall and gain more strength in her hand    Currently in Pain?  No/denies         [facility] PT Assessment - 01/26/17 1411      Observation/Other Assessments   Focus on Therapeutic Outcomes (FOTO)   64% decreased from eval but NEURO QOL increased significantly    Other Surveys   Other Surveys    Neuro Quality of Life   LE: eval - 15.7; D/C: 44.4      6 Minute Walk- Baseline    6 Minute Walk- Baseline  yes    BP (mmHg)  127/77    HR (bpm)  78    02 Sat (%RA)  99 %    Modified Borg Scale for Dyspnea  0- Nothing at all    Perceived Rate of Exertion (Borg)  6-      6 Minute walk- Post Test   6 Minute Walk Post Test  yes    BP (mmHg)  162/74    HR (bpm)  92    02 Sat (%RA)  98 %    Modified Borg Scale for Dyspnea  0.5- Very, very slight shortness of breath    Perceived Rate of Exertion (Borg)  10-      6 minute walk test results    Aerobic Endurance Distance Walked  1299    Endurance additional comments  no rest breaks, no AD.  increased from 1041 ft      Standardized Balance Assessment   Standardized Balance Assessment  10 meter walk test    10 Meter Walk  9.44 seconds or 3.4 ft/sec      Functional Gait  Assessment   Gait assessed   Yes    Gait Level  Surface  Walks 20 ft in less than 5.5 sec, no assistive devices, good speed, no evidence for imbalance, normal gait pattern, deviates no more than 6 in outside of the 12 in walkway width.    Change in Gait Speed  Able to smoothly change walking speed without loss of balance or gait deviation. Deviate no more than 6 in outside of the 12 in walkway width.    Gait with Horizontal Head Turns  Performs head turns smoothly with no change in gait. Deviates no more than 6 in outside 12 in walkway width    Gait with Vertical Head Turns  Performs head turns with no change in gait. Deviates no more than 6 in outside 12 in walkway width.    Gait and Pivot Turn  Pivot turns safely within 3 sec and stops quickly with no loss of balance.    Step Over Obstacle  Is able to step over one shoe box (4.5 in total height) without changing gait speed. No evidence of imbalance.    Gait with Narrow Base of Support  Ambulates less than 4 steps heel to toe or cannot perform without assistance.    Gait with Eyes Closed  Walks 20 ft, slow speed, abnormal gait pattern, evidence for imbalance, deviates 10-15 in outside 12 in walkway width.  Requires more than 9 sec to ambulate 20 ft.    Ambulating Backwards  Walks 20 ft, no assistive devices, good speed, no evidence for imbalance, normal gait    Steps  Alternating feet, no rail. two feet when descending due to knee pain    Total Score  24    FGA comment:  24/30 unchanged from week 4                          PT Education - 01/26/17 1457    Education provided  Yes    Education Details  clinical findings and progress made; plan to D/C from PT today    Person(s) Educated  Patient    Methods  Explanation    Comprehension  Verbalized understanding       PT Short Term Goals - 12/29/16 0834      PT SHORT TERM GOAL #1   Title  Pt will participate in further balance and endurance testing - 6 min walk, FGA    Baseline  see LTGs    Status  Achieved    Target Date  12/31/16      PT SHORT TERM GOAL #2   Title  Pt will participate in further vestibular assessment     Baseline  met 11/30    Time  4    Period  Weeks    Status  Achieved    Target Date  12/31/16      PT SHORT TERM GOAL #3   Title  Pt will improve gait velocity to > or = 3.8 ft/sec    Baseline  12/5: 3.6 ft/sec    Time  4    Period  Weeks    Status  Not Met    Target Date  12/31/16      PT SHORT TERM GOAL #4   Title  Will negotiate 2 stairs x 3 reps, no rails, alternating sequence with min A    Baseline  12/5: Pt demonstrated alt. pattern with supervision but required one rail.     Time  4    Period  Weeks    Status  Partially Met    Target Date  12/31/16        PT Long Term Goals - 01/26/17 1409      PT LONG TERM GOAL #1   Title  Pt will perform balance and strengthening HEP with husband's supervision    Time  8    Period  Weeks    Status  Achieved      PT LONG TERM GOAL #2   Title  Pt will improve 6 min walk test distance by 100'     Baseline  1299'    Time  8    Period  Weeks    Status  Achieved      PT LONG TERM GOAL #3   Title  Pt will improve FGA score by 4 points  from initial assessment    Baseline  12/09/16: 24/30 scored today for baseline; 24/30 at D/C - pt reports this is her baseline balance    Time  8    Period  Weeks    Status  Not Met      PT LONG TERM GOAL #4   Title  Pt will improve gait velocity to > or = 4.0 ft/sec for improved safety in community    Baseline  3.4 unchanged - pt feels this is her baseline walking speed    Time  8    Period  Weeks    Status  Not Met      PT LONG TERM GOAL #5   Title  Pt will improve overall function (FOTO) to > or = 85%    Baseline  Overall function decreased to 64% but NEURO QOL increased for LE from 15.7 to 44.4.    Time  8    Period  Weeks    Status  Partially Met      PT LONG TERM GOAL #6   Title  Ambulate 1000' over uneven outdoor surfaces/curbs with supervision and negotiate 2 stairs x 3 reps without rails, alternating sequence with supervision    Baseline  reports negotiating stairs and curbs in community when shopping and at church    Time  8    Period  Weeks    Status  Achieved      PT LONG TERM GOAL #7   Title  Will report returning to 50% of baseline function/activities (household activities, carrying boxes from utility building, decorating for Christmas)    Time  8    Period  Weeks    Status  Achieved            Plan - 01/26/17 1458    Clinical Impression Statement  Treatment session today focused on assessment of progress towards LTG.  Pt has met 4/7 LTG and partially met FOTO goal with decrease in overall FOTO score but increase in NEURO QOL for LE from 15.7 to 44.4.  Pt has also demonstrated improvements in functional endurance, balance for community ambulation and has returned to assisting with decorating at the church.  Pt feels that her gait speed and functional balance during gait (FGA) are at her baseline and her current limitations are due to chronic knee pain/OA.  Pt is safe and ready for D/C.    PT Frequency  2x / week    PT Duration  8 weeks    PT  Treatment/Interventions  ADLs/Self Care Home Management;Gait training;Stair training;Functional mobility training;Therapeutic activities;Therapeutic exercise;Balance training;Neuromuscular re-education;Patient/family education;Energy conservation;Vestibular;Visual/perceptual remediation/compensation    PT Next Visit Plan  D/C    Consulted  and Agree with Plan of Care  Patient       Patient will benefit from skilled therapeutic intervention in order to improve the following deficits and impairments:  Decreased activity tolerance, Decreased balance, Decreased endurance, Decreased strength, Difficulty walking, Dizziness, Impaired UE functional use, Impaired vision/preception, Pain  Visit Diagnosis: Hemiplegia and hemiparesis following cerebral infarction affecting right dominant side (HCC)  Muscle weakness (generalized)  Unsteadiness on feet  Dizziness and giddiness  Difficulty in walking, not elsewhere classified     Problem List Patient Active Problem List   Diagnosis Date Noted  . CVA (cerebral vascular accident) ([place]) 10/25/2016  . Stroke (cerebrum) ([place]) 10/24/2016  . Essential hypertension 10/24/2016  . Diabetes mellitus with complication ([place]) 46/96/2952  . Chronic pain 10/24/2016    PHYSICAL THERAPY DISCHARGE SUMMARY  Visits from Start of Care: 13  Current functional level related to goals / functional outcomes: See LTG achievement and impression statement above   Remaining deficits: Knee pain, impaired balance   Education / Equipment: HEP  Plan: Patient agrees to discharge.  Patient goals were partially met. Patient is being discharged due to being pleased with the current functional level.  ?????     Rico Junker, PT, DPT 01/26/17    3:02 PM    [facility] [address] [place], [place], [address] Phone: [phone]   Fax:  [phone]  Name: Jill Howard MRN: [id number] Date of Birth:  April 14, 1940

## 2017-01-27 ENCOUNTER — Other Ambulatory Visit: Payer: Self-pay

## 2017-01-27 ENCOUNTER — Ambulatory Visit: Payer: Medicare Other | Admitting: Occupational Therapy

## 2017-01-27 ENCOUNTER — Ambulatory Visit: Payer: Medicare Other

## 2017-01-27 ENCOUNTER — Encounter: Payer: Self-pay | Admitting: Occupational Therapy

## 2017-01-27 DIAGNOSIS — R41840 Attention and concentration deficit: Secondary | ICD-10-CM

## 2017-01-27 DIAGNOSIS — R4701 Aphasia: Secondary | ICD-10-CM

## 2017-01-27 DIAGNOSIS — I69351 Hemiplegia and hemiparesis following cerebral infarction affecting right dominant side: Secondary | ICD-10-CM

## 2017-01-27 DIAGNOSIS — R278 Other lack of coordination: Secondary | ICD-10-CM

## 2017-01-27 DIAGNOSIS — R2681 Unsteadiness on feet: Secondary | ICD-10-CM

## 2017-01-27 DIAGNOSIS — R41842 Visuospatial deficit: Secondary | ICD-10-CM

## 2017-01-27 DIAGNOSIS — I69318 Other symptoms and signs involving cognitive functions following cerebral infarction: Secondary | ICD-10-CM

## 2017-01-27 DIAGNOSIS — R41841 Cognitive communication deficit: Secondary | ICD-10-CM

## 2017-01-27 DIAGNOSIS — M6281 Muscle weakness (generalized): Secondary | ICD-10-CM

## 2017-01-27 NOTE — Patient Instructions (Signed)
  Please complete the assigned speech therapy homework prior to your next session and return it to the speech therapist at your next visit.  

## 2017-01-27 NOTE — Therapy (Addendum)
[facility] [address] [place], [place], [address] Phone: [phone]   Fax:  [phone]  Occupational Therapy Treatment  Patient Details  Name: Jill Howard MRN: [id number] Date of Birth: Jun 03, 1940 Referring Provider (Historical): Lavella Hammock, [place]   Encounter Date: 01/27/2017  OT End of Session - 01/27/17 1455    Visit Number  13    Number of Visits  17    Date for OT Re-Evaluation  01/30/17    Authorization Type  Medicare/AARP; G-code needed    OT Start Time  1453    OT Stop Time  1535    OT Time Calculation (min)  42 min    Activity Tolerance  Patient tolerated treatment well    Behavior During Therapy  [facility] for tasks assessed/performed       Past Medical History:  Diagnosis Date  . Diabetes mellitus without complication ([place])   . Hypertension   . Kidney stone   . Stroke ([place])   . Vertigo     Past Surgical History:  Procedure Laterality Date  . ABDOMINAL HYSTERECTOMY    . KIDNEY STONE SURGERY      There were no vitals filed for this visit.  Subjective Assessment - 01/27/17 1455    Subjective   Pt reports that she feels more confident about things now and feels like she is seeing better.    Patient is accompained by:  Family member    Pertinent History  vertigo, fibromyalgia, DM, HTN, hyperlipidemia, ?vascular dementia     Limitations  fall risk, R homonymous hemianopsia    Patient Stated Goals  improve balance, improve strength, improve vision, would like to be able to drive again    Currently in Pain?  No/denies    Pain Onset  In the past 7 days        Played Spot It for visual scanning, cognition, and quick reaction time.  Pt with good performance with new activity.  Environmental scanning with divided attention (category generation) in busy environment with good accuracy, occasional min verbal prompts to avoid pauses with category generation.  In standing, responding to stimuli in R  and L visual fields (ball tossed from behind/each side while pt looking straight ahead).  Pt responded well to stimuli on both sides.     Checked goals and discussed progress.  Pt is appropriate for d/c at this time.                     OT Short Term Goals - 12/27/16 2156      OT SHORT TERM GOAL #1   Title  Pt will be independent with initial HEP for RUE strength.--check STGs 12/30/16    Status  Achieved      OT SHORT TERM GOAL #2   Title  Pt will verbalize understanding of visual compensation strategies.    Status  Achieved      OT SHORT TERM GOAL #3   Title  Pt will perform simple environmental scanning/navigation with at least 90% accuracy for incr safety for community activities.    Status  Achieved 12/20/16  100% accuracy      OT SHORT TERM GOAL #4   Title  Pt will perform complex tabletop visual scanning and reading with at least 95% accuracy.    Status  Achieved        OT Long Term Goals - 01/27/17 1517      OT LONG TERM GOAL #1  Title  Pt will be independent with updated RUE strengthening HEP.--check LTGs 01/30/17    Status  Achieved      OT LONG TERM GOAL #2   Title  Pt will perform environmental scanning with simple divided attention with at least 90% accuracy for improved safety for community activities.    Status  Achieved      OT LONG TERM GOAL #3   Title  Pt will perform all previous home maintenance tasks independently.    Status  Achieved 01/09/17      OT LONG TERM GOAL #4   Title  Pt will perform environmental scanning/navigation in busy environment with at least 90% accuracy for improved safety for community activities.    Time  8    Period  Weeks    Status  Achieved            Plan - 01/27/17 1456    Clinical Impression Statement  Pt continues to progress well towards goals.  Pt continues to have some difficulty with more complex divided attention, but is compensating well for visual deficits.    Rehab Potential  Good    OT  Frequency  2x / week    OT Duration  8 weeks    Plan  d/c OT    OT Home Exercise Plan  Education provided:  visual compensation strategies; yellow theraband HEP--updated to red    Consulted and Agree with Plan of Care  Patient;Family member/caregiver       Patient will benefit from skilled therapeutic intervention in order to improve the following deficits and impairments:  Impaired vision/preception, Decreased mobility, Decreased balance, Decreased endurance, Decreased cognition, Decreased coordination, Decreased strength, Decreased activity tolerance  Visit Diagnosis: Hemiplegia and hemiparesis following cerebral infarction affecting right dominant side (HCC)  Muscle weakness (generalized)  Unsteadiness on feet  Other lack of coordination  Visuospatial deficit  Other symptoms and signs involving cognitive functions following cerebral infarction  Attention and concentration deficit    Problem List Patient Active Problem List   Diagnosis Date Noted  . CVA (cerebral vascular accident) ([place]) 10/25/2016  . Stroke (cerebrum) ([place]) 10/24/2016  . Essential hypertension 10/24/2016  . Diabetes mellitus with complication ([place]) 24/26/8341  . Chronic pain 10/24/2016    OCCUPATIONAL THERAPY DISCHARGE SUMMARY  Visits from Start of Care: 13  Current functional level related to goals / functional outcomes: See above   Remaining deficits: decr divided attention (improved); visual scanning improved and pt appears demo/reports improved R visual field deficit/compensation for visual-perceptual deficits, RUE strength improved   Education / Equipment: Pt instructed in visual compensation strategies, HEP.  Pt verbalized understanding of all education provided.  Plan: Patient agrees to discharge.  Patient goals were met. Patient is being discharged due to meeting the stated rehab goals.  ?????    (will fax d/c note)   [facility] 01/27/2017, 4:47 PM  [facility] [address] [place], [place], [address] Phone: [phone]   Fax:  [phone]  Name: Jill Howard MRN: [id number] Date of Birth: 30-Aug-1940   Vianne Bulls, OTR/L [facility] [address] [place], [place]  [address] [phone] phone [phone] 01/27/17 4:47 PM

## 2017-01-27 NOTE — Therapy (Signed)
[facility] [address] [place], [place], [address] Phone: [phone]   Fax:  [phone]  Speech Language Pathology Treatment  Patient Details  Name: Jill Howard MRN: [id number] Date of Birth: 10-16-1940 Referring Provider: Lavella Hammock PA-C   Encounter Date: 01/27/2017  End of Session - 01/27/17 1600    Visit Number  10    Number of Visits  17    Date for SLP Re-Evaluation  02/03/17    SLP Start Time  9371    SLP Stop Time   1445    SLP Time Calculation (min)  42 min    Activity Tolerance  Patient tolerated treatment well       Past Medical History:  Diagnosis Date  . Diabetes mellitus without complication ([place])   . Hypertension   . Kidney stone   . Stroke ([place])   . Vertigo     Past Surgical History:  Procedure Laterality Date  . ABDOMINAL HYSTERECTOMY    . KIDNEY STONE SURGERY      There were no vitals filed for this visit.  Subjective Assessment - 01/27/17 1416    Subjective  Pt provided homework witout SLP asking. "I had that stomach bug."    Currently in Pain?  No/denies            ADULT SLP TREATMENT - 01/27/17 1418      General Information   Behavior/Cognition  Alert;Cooperative;Pleasant mood      Treatment Provided   Treatment provided  Cognitive-Linquistic      Cognitive-Linquistic Treatment   Treatment focused on  Cognition    Skilled Treatment  SLP reviewed homework with pt. Pt performance with homework was 100% success (26 step sequencing, city stores map). Pt planned out 26-step on a separate sheet of paper. SLP facilitated pt's cognitive linguistic skills. Pt noted as tangential in conversation rarely. Most of the time today when conversation ensued, pt remained appropriately on topic.       Assessment / Recommendations / Plan   Plan  Continue with current plan of care      Progression Toward Goals   Progression toward goals  Progressing toward goals         SLP  Short Term Goals - 01/12/17 1556      SLP SHORT TERM GOAL #1   Title  Pt will name 10 items for a simple category with occasional min A over 2 sessions    Status  Achieved      SLP SHORT TERM GOAL #2   Title  Pt will write at sentence level, ID and correct her errors with occasional min A over 2 sessions    Baseline  01/09/17    Time  --    Period  Weeks    Status  Partially Met and ongoing - see LTG      SLP SHORT TERM GOAL #3   Title  Pt will utilize compensations for aphasia in structured language tasks with occasional min cues over 3 sessions    Status  Achieved      SLP SHORT TERM GOAL #4   Title  Cognitive linguistic eval to be completed, goals added if indicated    Baseline  CLQT begun 01/09/17    Time  --    Period  --    Status  Achieved       SLP Long Term Goals - 01/27/17 1444      SLP LONG TERM GOAL #1  Title  Pt will name 8 items in a mildly complex category with occasional min A over 2 sessions    Time  3    Period  Weeks    Status  On-going      SLP LONG TERM GOAL #2   Title  Pt will write 2-3 sentnece paragraph, correcting her errors, with occasional min A over 2 sessions    Time  3    Period  Weeks    Status  On-going      SLP LONG TERM GOAL #3   Title  Pt will utilize compensations for aphasia over 12 minute milldy complex conversation with 2 or less requests for repair/clarification by ST over 3 sessions    Time  3    Period  Weeks    Status  On-going      SLP LONG TERM GOAL #4   Title  pt will demo skills to complete a simple executive function task with rare min A x2 sessions    Time  3    Period  Weeks    Status  On-going       Plan - Feb 17, 2017 1600    Clinical Impression Statement  Pt presents today with impairment in cognitive linguistics, however pt noted today to adhere to topic mostly WNL. Paraphasic errors not noted in conversation. See skilled intervention for details. Continued ST intervention is recommended to maximize effective  communication for improved QOL. Pt would benefit from formal cognitive testing.     Speech Therapy Frequency  2x / week    Treatment/Interventions  Compensatory strategies;Patient/family education;Functional tasks;Cueing hierarchy;Environmental controls;Cognitive reorganization;Multimodal communcation approach;Language facilitation;Internal/external aids;SLP instruction and feedback    Potential to Achieve Goals  Good    Potential Considerations  Ability to learn/carryover information    SLP Home Exercise Plan  continue worksheets    Consulted and Agree with Plan of Care  Patient       Patient will benefit from skilled therapeutic intervention in order to improve the following deficits and impairments:   Cognitive communication deficit  Aphasia  G-Codes - 2017/02/17 1602    Functional Assessment Tool Used  NOMS    Functional Limitations  Spoken language expressive    Spoken Language Expression Current Status [phone])  At least 1 percent but less than 20 percent impaired, limited or restricted    Spoken Language Expression Goal Status ([id number])  At least 1 percent but less than 20 percent impaired, limited or restricted      Speech Therapy Progress Note  Dates of Reporting Period: 12-08-17 to 02/17/17  Objective Reports of Subjective Statement: Pt was seen for 10 visits primarily focusing on cognitive linguistics. Aphasia appears to have mostly resolved.   Objective Measurements: Pt's attention has improved, attention to detail has improved.  Goal Update: See goals above  Plan: Cont to see pt for skilled ST.  Reason Skilled Services are Required: Pt's higher level attention and other cognitive-linguistic skills cont to require SLP cues. Pt is satisfied with continuing her remaining scheduled 4 visits and then d/c.  Problem List Patient Active Problem List   Diagnosis Date Noted  . CVA (cerebral vascular accident) ([place]) 10/25/2016  . 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 Essential  hypertension 10/24/2016  . Diabetes mellitus with complication ([place]) 70/26/3785  . Chronic pain 10/24/2016    [facility] ,MS, CCC-SLP  2017/02/17, 4:03 PM  [facility] [address] [place], [place], [address] Phone: [phone]   Fax:  [place]   Name: Jill Howard MRN: [id number] Date of Birth: May 13, 1940

## 2017-01-31 ENCOUNTER — Ambulatory Visit: Payer: Medicare Other | Admitting: Speech Pathology

## 2017-01-31 DIAGNOSIS — I69351 Hemiplegia and hemiparesis following cerebral infarction affecting right dominant side: Secondary | ICD-10-CM | POA: Diagnosis not present

## 2017-01-31 DIAGNOSIS — R4701 Aphasia: Secondary | ICD-10-CM

## 2017-01-31 DIAGNOSIS — R41841 Cognitive communication deficit: Secondary | ICD-10-CM

## 2017-01-31 NOTE — Patient Instructions (Signed)
  Scattergories, Family Feud, Taboo, St. AnneOutburst, BrillionScrabble, Patent attorneyassword, Easy Crosswords  Name items in categories  Name items in a category for each letter of the alphabet (for example: Christmas things, summer things, furniture, colors, Easter, foods, kitchen items etc) this is more of a challenge. You can name more than one thing for each letter - Also write them  When you can't think of a word - describe it, gesture it, define it

## 2017-01-31 NOTE — Therapy (Signed)
[facility] [address] [place], [place], [address] Phone: [phone]   Fax:  [phone]  Speech Language Pathology Treatment  Patient Details  Name: Jill Howard MRN: [id number] Date of Birth: 1940-07-15 Referring Provider: Lavella Hammock PA-C   Encounter Date: 01/31/2017  End of Session - 01/31/17 1513    Visit Number  11    Number of Visits  17    Date for SLP Re-Evaluation  02/03/17    SLP Start Time  54    SLP Stop Time   1400    SLP Time Calculation (min)  42 min    Activity Tolerance  Patient tolerated treatment well       Past Medical History:  Diagnosis Date  . Diabetes mellitus without complication ([place])   . Hypertension   . Kidney stone   . Stroke ([place])   . Vertigo     Past Surgical History:  Procedure Laterality Date  . ABDOMINAL HYSTERECTOMY    . KIDNEY STONE SURGERY      There were no vitals filed for this visit.         ADULT SLP TREATMENT - 01/31/17 1335      General Information   Behavior/Cognition  Alert;Cooperative;Pleasant mood      Treatment Provided   Treatment provided  Cognitive-Linquistic      Pain Assessment   Pain Assessment  No/denies pain      Cognitive-Linquistic Treatment   Treatment focused on  Cognition    Skilled Treatment  Facilitated organization/executive function generating chart to organize schedule and hours with rare min A - Pt independently utilized strategy of crossing off numbers/names as she completed them. Pt also independently double checked her work., however on calculator tasks, pt required min A to ID errors. Pt named 6 items in a mildly complex category with min 1st letter cues, required min semantic cues to name more than 6 items      Assessment / Recommendations / [place] with current plan of care      Progression Toward Goals   Progression toward goals  Progressing toward goals         SLP Short Term Goals -  01/31/17 1512      SLP SHORT TERM GOAL #1   Title  Pt will name 10 items for a simple category with occasional min A over 2 sessions    Status  Achieved      SLP SHORT TERM GOAL #2   Title  Pt will write at sentence level, ID and correct her errors with occasional min A over 2 sessions    Baseline  01/09/17    Period  Weeks    Status  Partially Met and ongoing - see LTG      SLP SHORT TERM GOAL #3   Title  Pt will utilize compensations for aphasia in structured language tasks with occasional min cues over 3 sessions    Status  Achieved      SLP SHORT TERM GOAL #4   Title  Cognitive linguistic eval to be completed, goals added if indicated    Baseline  CLQT begun 01/09/17    Status  Achieved       SLP Long Term Goals - 01/31/17 1512      SLP LONG TERM GOAL #1   Title  Pt will name 8 items in a mildly complex category with occasional min A over 2 sessions  Time  2    Period  Weeks    Status  On-going      SLP LONG TERM GOAL #2   Title  Pt will write 2-3 sentnece paragraph, correcting her errors, with occasional min A over 2 sessions    Time  2    Period  Weeks    Status  On-going      SLP LONG TERM GOAL #3   Title  Pt will utilize compensations for aphasia over 12 minute milldy complex conversation with 2 or less requests for repair/clarification by ST over 3 sessions    Time  2    Period  Weeks    Status  On-going      SLP LONG TERM GOAL #4   Title  pt will demo skills to complete a simple executive function task with rare min A x2 sessions    Time  2    Period  Weeks    Status  On-going       Plan - 01/31/17 1509    Clinical Impression Statement  Pt continues to present with aphasia in conversation and structured naming tasks today. Organization, attention and error awareness required min to mod A - Continue skilled ST to maximize cognitive linguistic skills for improved communication and independence. Cognitive testing not completed today.      Treatment/Interventions  Compensatory strategies;Patient/family education;Functional tasks;Cueing hierarchy;Environmental controls;Cognitive reorganization;Multimodal communcation approach;Language facilitation;Internal/external aids;SLP instruction and feedback    Potential to Achieve Goals  Good    Potential Considerations  Ability to learn/carryover information       Patient will benefit from skilled therapeutic intervention in order to improve the following deficits and impairments:   Cognitive communication deficit  Aphasia    Problem List Patient Active Problem List   Diagnosis Date Noted  . CVA (cerebral vascular accident) ([place]) 10/25/2016  . 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 Essential hypertension 10/24/2016  . Diabetes mellitus with complication ([place]) 93/81/8299  . Chronic pain 10/24/2016    [place], Annye Rusk MS, CCC-SLP 01/31/2017, 3:14 PM  [facility] [address] [place], [place], [address] Phone: [phone]   Fax:  [phone]   Name: Jill Howard MRN: [id number] Date of Birth: [birth year]

## 2017-02-02 ENCOUNTER — Ambulatory Visit: Payer: Medicare Other | Admitting: Speech Pathology

## 2017-02-02 ENCOUNTER — Encounter: Payer: Self-pay | Admitting: Speech Pathology

## 2017-02-02 DIAGNOSIS — I69351 Hemiplegia and hemiparesis following cerebral infarction affecting right dominant side: Secondary | ICD-10-CM | POA: Diagnosis not present

## 2017-02-02 DIAGNOSIS — R4701 Aphasia: Secondary | ICD-10-CM

## 2017-02-02 NOTE — Therapy (Signed)
[facility] [address] [place], [place], [address] Phone: [phone]   Fax:  [phone]  Speech Language Pathology Treatment  Patient Details  Name: Jill Howard MRN: [id number] Date of Birth: 1940-10-22 Referring Provider: Lavella Hammock PA-C   Encounter Date: 02/02/2017  End of Session - 02/02/17 1618    Visit Number  13    Number of Visits  33    Date for SLP Re-Evaluation  03/31/17    SLP Start Time  3235    SLP Stop Time   1403    SLP Time Calculation (min)  46 min    Activity Tolerance  Patient tolerated treatment well       Past Medical History:  Diagnosis Date  . Diabetes mellitus without complication ([place])   . Hypertension   . Kidney stone   . Stroke ([place])   . Vertigo     Past Surgical History:  Procedure Laterality Date  . ABDOMINAL HYSTERECTOMY    . KIDNEY STONE SURGERY      There were no vitals filed for this visit.  Subjective Assessment - 02/02/17 1514    Subjective  "I got the homework corrected last night"    Currently in Pain?  No/denies            ADULT SLP TREATMENT - 02/02/17 1514      General Information   Behavior/Cognition  Alert;Cooperative;Pleasant mood      Treatment Provided   Treatment provided  Cognitive-Linquistic      Pain Assessment   Pain Assessment  No/denies pain      Cognitive-Linquistic Treatment   Treatment focused on  Cognition    Skilled Treatment  Pt completed moderately complex organization home work accurately. She found and corrected errors indepdently. Cognition formally assessed today - [place] Cognitive Assessment (MoCA) revealed a 26/30 which is [facility]. Pt recalled 5/5 words with a delay. Errors in sentence repetition and figure coptying . Pt perfromed serial 7 subtractions with 3/5 correct. Divergent naming -mildly complex, pt names 5 items in category, requiring semantic and written cues. Pt instructed to utilize compensations for word  finding episodes in conversation with ongoing questioning cues.       Assessment / Recommendations / Plan   Plan  Continue with current plan of care      Progression Toward Goals   Progression toward goals  Progressing toward goals       SLP Education - 02/02/17 1519    Education provided  Yes    Education Details  use describing/definition for aphasia compensations    Person(s) Educated  Patient    Methods  Explanation       SLP Short Term Goals - 02/02/17 1521      SLP SHORT TERM GOAL #1   Title  Pt will name 10 items for a simple category with occasional min A over 2 sessions    Status  Achieved      SLP SHORT TERM GOAL #2   Title  Pt will write at sentence level, ID and correct her errors with occasional min A over 2 sessions    Baseline  01/09/17    Period  Weeks    Status  Partially Met and ongoing - see LTG      SLP SHORT TERM GOAL #3   Title  Pt will utilize compensations for aphasia in structured language tasks with occasional min cues over 3 sessions    Status  Achieved  SLP SHORT TERM GOAL #4   Title  Cognitive linguistic eval to be completed, goals added if indicated    Baseline  CLQT begun 01/09/17    Status  Achieved       SLP Long Term Goals - 02/02/17 1617      SLP LONG TERM GOAL #1   Title  Pt will name 8 items in a mildly complex category with occasional min A over 2 sessions    Time  8    Period  Weeks    Status  On-going      SLP LONG TERM GOAL #2   Title  Pt will write 2-3 sentnece paragraph, correcting her errors, with occasional min A over 2 sessions    Time  8    Period  Weeks    Status  On-going      SLP LONG TERM GOAL #3   Title  Pt will utilize compensations for aphasia over 12 minute milldy complex conversation with 2 or less requests for repair/clarification by ST over 3 sessions    Time  8    Period  Weeks    Status  On-going      SLP LONG TERM GOAL #4   Title  pt will demo skills to complete a simple executive function  task with rare min A x2 sessions    Time  2    Period  Weeks    Status  Achieved       Plan - 02/02/17 1615    Clinical Impression Statement  Cognitive assessment (MoCA) completed - pt scored 26/30, low WNL. She continues to present with aphasia affecting conversation and communication of wants. Pt has made steady progress towards goals. Trained in compensations for aphasia. I recommend she ontinue skilled 2x a week for 8 more weeks to maximize communication for independence, to reduce caregiver burden and QOL. I updated dates on goals, and completed re-cert.     Duration  -- 16 weeks or total of 33 visits    Treatment/Interventions  Compensatory strategies;Patient/family education;Functional tasks;Cueing hierarchy;Environmental controls;Cognitive reorganization;Multimodal communcation approach;Language facilitation;Internal/external aids;SLP instruction and feedback    Potential to Achieve Goals  Good    Potential Considerations  Ability to learn/carryover information       Patient will benefit from skilled therapeutic intervention in order to improve the following deficits and impairments:   Aphasia - Plan: SLP plan of care cert/re-cert    Problem List Patient Active Problem List   Diagnosis Date Noted  . CVA (cerebral vascular accident) ([place]) 10/25/2016  . 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 Essential hypertension 10/24/2016  . Diabetes mellitus with complication ([place]) 34/19/3790  . Chronic pain 10/24/2016    Sayeed Weatherall, Annye Rusk MS, CCC-SLP 02/02/2017, 4:21 PM  [facility] [address] [place], [place], [address] Phone: [phone]   Fax:  [phone]   Name: Jill Howard MRN: [id number] Date of Birth: August 11, 1940

## 2017-02-07 ENCOUNTER — Other Ambulatory Visit: Payer: Self-pay

## 2017-02-07 ENCOUNTER — Encounter: Payer: Self-pay | Admitting: Speech Pathology

## 2017-02-07 ENCOUNTER — Ambulatory Visit: Payer: Medicare Other | Admitting: Speech Pathology

## 2017-02-07 DIAGNOSIS — R4701 Aphasia: Secondary | ICD-10-CM

## 2017-02-07 DIAGNOSIS — I69351 Hemiplegia and hemiparesis following cerebral infarction affecting right dominant side: Secondary | ICD-10-CM | POA: Diagnosis not present

## 2017-02-07 NOTE — Therapy (Signed)
[facility] [address] [place], [place], [address] Phone: [phone]   Fax:  [phone]  Speech Language Pathology Treatment  Patient Details  Name: Jill Howard MRN: [id number] Date of Birth: 11/19/1940 Referring Provider: Donna Jean King PA-C   Encounter Date: 02/07/2017  End of Session - 02/07/17 1522    Visit Number  14    Number of Visits  33    Date for SLP Re-Evaluation  03/31/17    SLP Start Time  1317    SLP Stop Time   1400    SLP Time Calculation (min)  43 min    Activity Tolerance  Patient tolerated treatment well       Past Medical History:  Diagnosis Date  . Diabetes mellitus without complication (HCC)   . Hypertension   . Kidney stone   . Stroke (HCC)   . Vertigo     Past Surgical History:  Procedure Laterality Date  . ABDOMINAL HYSTERECTOMY    . KIDNEY STONE SURGERY      There were no vitals filed for this visit.  Subjective Assessment - 02/07/17 1324    Subjective  "He says what he wants me to say and that is not what I am saying"    Currently in Pain?  No/denies            ADULT SLP TREATMENT - 02/07/17 1325      General Information   Behavior/Cognition  Alert;Cooperative;Pleasant mood      Treatment Provided   Treatment provided  Cognitive-Linquistic      Pain Assessment   Pain Assessment  No/denies pain      Cognitive-Linquistic Treatment   Treatment focused on  Cognition    Skilled Treatment  Facilitated written epxression generating moderately complex sentences with 3 given words with extended time and occasional min A for error awareness. Pt did verbalize that this writing task took longer than it would have prior to her CVA.  Pt named 6 items for moderately complex categories, required mod A and extended time       Assessment / Recommendations / Plan   Plan  Continue with current plan of care      Progression Toward Goals   Progression toward goals   Progressing toward goals         SLP Short Term Goals - 02/07/17 1521      SLP SHORT TERM GOAL #1   Title  Pt will name 10 items for a simple category with occasional min A over 2 sessions    Status  Achieved      SLP SHORT TERM GOAL #2   Title  Pt will write at sentence level, ID and correct her errors with occasional min A over 2 sessions    Baseline  01/09/17    Period  Weeks    Status  Partially Met and ongoing - see LTG      SLP SHORT TERM GOAL #3   Title  Pt will utilize compensations for aphasia in structured language tasks with occasional min cues over 3 sessions    Status  Achieved      SLP SHORT TERM GOAL #4   Title  Cognitive linguistic eval to be completed, goals added if indicated    Baseline  CLQT begun 01/09/17    Status  Achieved       SLP Long Term Goals - 02/07/17 1521      SLP LONG TERM GOAL #1     Title  Pt will name 8 items in a mildly complex category with occasional min A over 2 sessions    Time  7    Period  Weeks    Status  On-going      SLP LONG TERM GOAL #2   Title  Pt will write 2-3 sentnece paragraph, correcting her errors, with occasional min A over 2 sessions    Time  7    Period  Weeks    Status  On-going      SLP LONG TERM GOAL #3   Title  Pt will utilize compensations for aphasia over 12 minute milldy complex conversation with 2 or less requests for repair/clarification by ST over 3 sessions    Time  7    Period  Weeks    Status  On-going      SLP LONG TERM GOAL #4   Title  pt will demo skills to complete a simple executive function task with rare min A x2 sessions    Time  2    Period  Weeks    Status  Achieved       Plan - 02/07/17 1519    Clinical Impression Statement  Pt making good progress with word finding and writing at sentence level with extended time and min A.  Word finding difficulties and aphasic errors in written expression persist. I recommend pt continue skilled ST to maximize communication for improved  independence in community. Pt to return Thursday this week, then continue 1x a week for 3-4 more weeks.    Speech Therapy Frequency  2x / week    Treatment/Interventions  Compensatory strategies;Patient/family education;Functional tasks;Cueing hierarchy;Environmental controls;Cognitive reorganization;Multimodal communcation approach;Language facilitation;Internal/external aids;SLP instruction and feedback    Potential to Achieve Goals  Good    Consulted and Agree with Plan of Care  Patient       Patient will benefit from skilled therapeutic intervention in order to improve the following deficits and impairments:   Aphasia    Problem List Patient Active Problem List   Diagnosis Date Noted  . CVA (cerebral vascular accident) ([place]) 10/25/2016  . 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 Essential hypertension 10/24/2016  . Diabetes mellitus with complication ([place]) 12/45/8099  . Chronic pain 10/24/2016    Audry Kauzlarich, Annye Rusk MS, CCC-SLP 02/07/2017, 3:22 PM  [facility] [address] [place], [place], [address] Phone: [phone]   Fax:  [phone]   Name: Jill Howard MRN: [id number] Date of Birth: 1940-12-26

## 2017-02-09 ENCOUNTER — Encounter: Payer: Medicare Other | Admitting: Speech Pathology

## 2017-02-14 ENCOUNTER — Ambulatory Visit: Payer: Medicare Other | Admitting: Speech Pathology

## 2017-02-14 DIAGNOSIS — I69351 Hemiplegia and hemiparesis following cerebral infarction affecting right dominant side: Secondary | ICD-10-CM | POA: Diagnosis not present

## 2017-02-14 DIAGNOSIS — R4701 Aphasia: Secondary | ICD-10-CM

## 2017-02-14 NOTE — Therapy (Signed)
[facility] [address] [place], [place], [address] Phone: [phone]   Fax:  [phone]  Speech Language Pathology Treatment  Patient Details  Name: Jill Howard MRN: [id number] Date of Birth: 05/20/1940 Referring Provider: Lavella Hammock PA-C   Encounter Date: 02/14/2017  End of Session - 02/14/17 1158    Visit Number  15    Number of Visits  33    Date for SLP Re-Evaluation  03/31/17    SLP Start Time  1100    SLP Stop Time   1147    SLP Time Calculation (min)  47 min    Activity Tolerance  Patient tolerated treatment well       Past Medical History:  Diagnosis Date  . Diabetes mellitus without complication ([place])   . Hypertension   . Kidney stone   . Stroke ([place])   . Vertigo     Past Surgical History:  Procedure Laterality Date  . ABDOMINAL HYSTERECTOMY    . KIDNEY STONE SURGERY      There were no vitals filed for this visit.  Subjective Assessment - 02/14/17 1113    Subjective  "My words are in there, I just have to figure out how to get them out"    Currently in Pain?  No/denies            ADULT SLP TREATMENT - 02/14/17 1114      General Information   Behavior/Cognition  Alert;Cooperative;Pleasant mood      Treatment Provided   Treatment provided  Cognitive-Linquistic      Pain Assessment   Pain Assessment  No/denies pain      Cognitive-Linquistic Treatment   Treatment focused on  Aphasia    Skilled Treatment  Pt returned functional written paragraph homework with minimal aphasic errors. Divergent naming moderately complex categories pt independently named 7 items in a category, she rerquired          SLP Short Term Goals - 02/14/17 1158      SLP SHORT TERM GOAL #1   Title  Pt will name 10 items for a simple category with occasional min A over 2 sessions    Status  Achieved      SLP SHORT TERM GOAL #2   Title  Pt will write at sentence level, ID and correct her errors  with occasional min A over 2 sessions    Baseline  01/09/17    Period  Weeks    Status  Partially Met and ongoing - see LTG      SLP SHORT TERM GOAL #3   Title  Pt will utilize compensations for aphasia in structured language tasks with occasional min cues over 3 sessions    Status  Achieved      SLP SHORT TERM GOAL #4   Title  Cognitive linguistic eval to be completed, goals added if indicated    Baseline  CLQT begun 01/09/17    Status  Achieved       SLP Long Term Goals - 02/14/17 1158      SLP LONG TERM GOAL #1   Title  Pt will name 8 items in a mildly complex category with occasional min A over 2 sessions    Time  6    Period  Weeks    Status  On-going      SLP LONG TERM GOAL #2   Title  Pt will write 2-3 sentnece paragraph, correcting her errors, with occasional min A  over 2 sessions    Time  6    Period  Weeks    Status  On-going      SLP LONG TERM GOAL #3   Title  Pt will utilize compensations for aphasia over 12 minute milldy complex conversation with 2 or less requests for repair/clarification by ST over 3 sessions    Time  7    Period  Weeks    Status  On-going      SLP LONG TERM GOAL #4   Title  pt will demo skills to complete a simple executive function task with rare min A x2 sessions    Time  2    Period  Weeks    Status  Achieved       Plan - 02/14/17 1141    Clinical Impression Statement  Pt continues to progress, using compnesations for aphasia in conversations with questioning cues. She contiues to ID and correct errors in writing with rare min A. Continue skilled ST to maximize communication and carryover of compensations for aphasia for improved QOL and to reduce caregiver burden.     Speech Therapy Frequency  1x /week    Treatment/Interventions  Compensatory strategies;Patient/family education;Functional tasks;Cueing hierarchy;Environmental controls;Cognitive reorganization;Multimodal communcation approach;Language facilitation;Internal/external  aids;SLP instruction and feedback    Potential to Achieve Goals  Good    Potential Considerations  Ability to learn/carryover information    SLP Home Exercise Plan  continue worksheets    Consulted and Agree with Plan of Care  Patient       Patient will benefit from skilled therapeutic intervention in order to improve the following deficits and impairments:   Aphasia    Problem List Patient Active Problem List   Diagnosis Date Noted  . CVA (cerebral vascular accident) ([facility]) 10/25/2016  . 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 Essential hypertension 10/24/2016  . Diabetes mellitus with complication ([facility]) 29/92/4268  . Chronic pain 10/24/2016    [place], Annye Rusk MS, CCC-SLP 02/14/2017, 11:59 AM  [facility] [address] [place], [place], [address] Phone: [phone]   Fax:  [phone]   Name: Jill Howard MRN: [id number] Date of Birth: 17-Aug-1940

## 2017-03-01 ENCOUNTER — Ambulatory Visit: Payer: Medicare Other | Attending: Internal Medicine

## 2017-03-01 DIAGNOSIS — R4701 Aphasia: Secondary | ICD-10-CM | POA: Insufficient documentation

## 2017-03-01 DIAGNOSIS — R41841 Cognitive communication deficit: Secondary | ICD-10-CM | POA: Diagnosis present

## 2017-03-01 NOTE — Therapy (Signed)
[facility] [address] [place], [place], [address] Phone: [phone]   Fax:  [phone]  Speech Language Pathology Treatment  Patient Details  Name: Jill Howard MRN: [id number] Date of Birth: 09-Mar-1940 Referring Provider: Lavella Hammock PA-C   Encounter Date: 03/01/2017  End of Session - 03/01/17 1602    Visit Number  16    Number of Visits  33    Date for SLP Re-Evaluation  03/31/17    SLP Start Time  1533    SLP Stop Time   1610 last day of ST    SLP Time Calculation (min)  37 min    Activity Tolerance  Patient tolerated treatment well       Past Medical History:  Diagnosis Date  . Diabetes mellitus without complication ([facility])   . Hypertension   . Kidney stone   . Stroke ([facility])   . Vertigo     Past Surgical History:  Procedure Laterality Date  . ABDOMINAL HYSTERECTOMY    . KIDNEY STONE SURGERY      There were no vitals filed for this visit.  Subjective Assessment - 03/01/17 1556    Subjective  "I wanted to bring something on my last day." (pt, re: basket of treats)    Currently in Pain?  No/denies            ADULT SLP TREATMENT - 03/01/17 1557      General Information   Behavior/Cognition  Alert;Cooperative;Pleasant mood      Treatment Provided   Treatment provided  Cognitive-Linquistic      Cognitive-Linquistic Treatment   Treatment focused on  Aphasia    Skilled Treatment  Pt returns with homework from two sessions, as she was out last week due to sickness. Functional writing homework (instructions, formal letter, etc) was completed with aphasic errors x1. In 15 minute conversation re: mod complex topics (personalities and occupations of siblings) pt was without noted aphasic symptoms. Pt wrote an 11-sentence narrative about a situation with her siblings with x3 aphasic errors, ID'd by pt upon encouragement by SLP for reviewing her work.SLP suggested pt cont to write and work on naming  regularly after d/c.      Assessment / Recommendations / Plan   Plan  Discharge SLP treatment due to (comment) pt satisfied with current rehab level.      Progression Toward Goals   Progression toward goals  -- d/c day, see ST Long Term Goals         SLP Short Term Goals - 02/14/17 1158      SLP SHORT TERM GOAL #1   Title  Pt will name 10 items for a simple category with occasional min A over 2 sessions    Status  Achieved      SLP SHORT TERM GOAL #2   Title  Pt will write at sentence level, ID and correct her errors with occasional min A over 2 sessions    Baseline  01/09/17    Period  Weeks    Status  Partially Met and ongoing - see LTG      SLP SHORT TERM GOAL #3   Title  Pt will utilize compensations for aphasia in structured language tasks with occasional min cues over 3 sessions    Status  Achieved      SLP SHORT TERM GOAL #4   Title  Cognitive linguistic eval to be completed, goals added if indicated    Baseline  CLQT begun  01/09/17    Status  Achieved       SLP Long Term Goals - 03/01/17 1604      SLP LONG TERM GOAL #1   Title  Pt will name 8 items in a mildly complex category with occasional min A over 2 sessions    Status  Partially Met one session      SLP LONG TERM GOAL #2   Title  Pt will write 2-3 sentnece paragraph, correcting her errors, with occasional min A over 2 sessions    Status  Achieved      SLP LONG TERM GOAL #3   Title  Pt will utilize compensations for aphasia over 12 minute milldy complex conversation with 2 or less requests for repair/clarification by ST over 3 sessions    Status  Partially Met one sesssion      SLP LONG TERM GOAL #4   Title  pt will demo skills to complete a simple executive function task with rare min A x2 sessions    Status  Achieved         Patient will benefit from skilled therapeutic intervention in order to improve the following deficits and impairments:   Aphasia  Cognitive communication deficit    SPEECH THERAPY DISCHARGE SUMMARY  Visits from Start of Care: 16  Current functional level related to goals / functional outcomes: Pt's verbal and written language have improved dramatically since eval. See goals above in "ST Long Term Goals." Pt reports she is pleased with her success in [place].   Remaining deficits: Mild anomia, and mild written aphasia, both which pt compensates for well.   Education / Equipment: Compensations for anomia, written messages.  Plan: Patient agrees to discharge.  Patient goals were partially met. Patient is being discharged due to being pleased with the current functional level.  ?????    '  Problem List Patient Active Problem List   Diagnosis Date Noted  . CVA (cerebral vascular accident) ([facility]) 10/25/2016  . 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 Essential hypertension 10/24/2016  . Diabetes mellitus with complication ([facility]) 62/94/7654  . Chronic pain 10/24/2016    [facility] ,MS, CCC-SLP  03/01/2017, 4:30 PM  [facility] [address] [place], [place], [address] Phone: [phone]   Fax:  [phone]   Name: Jill Howard MRN: [id number] Date of Birth: Jun 06, 1940

## 2017-03-23 ENCOUNTER — Other Ambulatory Visit: Payer: Self-pay

## 2017-03-23 ENCOUNTER — Emergency Department (HOSPITAL_BASED_OUTPATIENT_CLINIC_OR_DEPARTMENT_OTHER): Payer: Medicare Other

## 2017-03-23 ENCOUNTER — Encounter (HOSPITAL_BASED_OUTPATIENT_CLINIC_OR_DEPARTMENT_OTHER): Payer: Self-pay | Admitting: *Deleted

## 2017-03-23 ENCOUNTER — Observation Stay (HOSPITAL_COMMUNITY): Payer: Medicare Other

## 2017-03-23 ENCOUNTER — Observation Stay (HOSPITAL_BASED_OUTPATIENT_CLINIC_OR_DEPARTMENT_OTHER)
Admission: EM | Admit: 2017-03-23 | Discharge: 2017-03-24 | Disposition: A | Discharge status: Home / Self Care | Payer: Medicare Other | Attending: Emergency Medicine | Admitting: Emergency Medicine

## 2017-03-23 DIAGNOSIS — G459 Transient cerebral ischemic attack, unspecified: Secondary | ICD-10-CM | POA: Diagnosis not present

## 2017-03-23 DIAGNOSIS — Z8673 Personal history of transient ischemic attack (TIA), and cerebral infarction without residual deficits: Secondary | ICD-10-CM | POA: Insufficient documentation

## 2017-03-23 DIAGNOSIS — Z91040 Latex allergy status: Secondary | ICD-10-CM | POA: Insufficient documentation

## 2017-03-23 DIAGNOSIS — E118 Type 2 diabetes mellitus with unspecified complications: Secondary | ICD-10-CM | POA: Diagnosis not present

## 2017-03-23 DIAGNOSIS — R1310 Dysphagia, unspecified: Secondary | ICD-10-CM | POA: Diagnosis not present

## 2017-03-23 DIAGNOSIS — R2990 Unspecified symptoms and signs involving the nervous system: Secondary | ICD-10-CM | POA: Diagnosis present

## 2017-03-23 DIAGNOSIS — R202 Paresthesia of skin: Principal | ICD-10-CM | POA: Insufficient documentation

## 2017-03-23 DIAGNOSIS — R29818 Other symptoms and signs involving the nervous system: Secondary | ICD-10-CM | POA: Diagnosis not present

## 2017-03-23 DIAGNOSIS — I6300 Cerebral infarction due to thrombosis of unspecified precerebral artery: Secondary | ICD-10-CM | POA: Diagnosis not present

## 2017-03-23 DIAGNOSIS — R200 Anesthesia of skin: Secondary | ICD-10-CM | POA: Diagnosis present

## 2017-03-23 DIAGNOSIS — Z7902 Long term (current) use of antithrombotics/antiplatelets: Secondary | ICD-10-CM | POA: Insufficient documentation

## 2017-03-23 DIAGNOSIS — Z794 Long term (current) use of insulin: Secondary | ICD-10-CM | POA: Insufficient documentation

## 2017-03-23 DIAGNOSIS — Z79899 Other long term (current) drug therapy: Secondary | ICD-10-CM | POA: Insufficient documentation

## 2017-03-23 DIAGNOSIS — I10 Essential (primary) hypertension: Secondary | ICD-10-CM | POA: Diagnosis not present

## 2017-03-23 DIAGNOSIS — I639 Cerebral infarction, unspecified: Secondary | ICD-10-CM | POA: Diagnosis present

## 2017-03-23 DIAGNOSIS — E119 Type 2 diabetes mellitus without complications: Secondary | ICD-10-CM | POA: Diagnosis not present

## 2017-03-23 LAB — BASIC METABOLIC PANEL
Anion gap: 12 (ref 5–15)
BUN: 19 mg/dL (ref 6–20)
CALCIUM: 9.4 mg/dL (ref 8.9–10.3)
CHLORIDE: 106 mmol/L (ref 101–111)
CO2: 19 mmol/L — ABNORMAL LOW (ref 22–32)
CREATININE: 0.69 mg/dL (ref 0.44–1.00)
Glucose, Bld: 270 mg/dL — ABNORMAL HIGH (ref 65–99)
Potassium: 4 mmol/L (ref 3.5–5.1)
SODIUM: 137 mmol/L (ref 135–145)

## 2017-03-23 LAB — CBC WITH DIFFERENTIAL/PLATELET
BASOS ABS: 0 10*3/uL (ref 0.0–0.1)
BASOS PCT: 0 %
EOS ABS: 0.2 10*3/uL (ref 0.0–0.7)
Eosinophils Relative: 3 %
HCT: 39.7 % (ref 36.0–46.0)
HEMOGLOBIN: 13.1 g/dL (ref 12.0–15.0)
Lymphocytes Relative: 46 %
Lymphs Abs: 3.2 10*3/uL (ref 0.7–4.0)
MCH: 29 pg (ref 26.0–34.0)
MCHC: 33 g/dL (ref 30.0–36.0)
MCV: 87.8 fL (ref 78.0–100.0)
Monocytes Absolute: 0.4 10*3/uL (ref 0.1–1.0)
Monocytes Relative: 6 %
NEUTROS PCT: 45 %
Neutro Abs: 3.1 10*3/uL (ref 1.7–7.7)
Platelets: 199 10*3/uL (ref 150–400)
RBC: 4.52 MIL/uL (ref 3.87–5.11)
RDW: 13.9 % (ref 11.5–15.5)
WBC: 6.9 10*3/uL (ref 4.0–10.5)

## 2017-03-23 LAB — CBC
HCT: 36.9 % (ref 36.0–46.0)
Hemoglobin: 12 g/dL (ref 12.0–15.0)
MCH: 28.6 pg (ref 26.0–34.0)
MCHC: 32.5 g/dL (ref 30.0–36.0)
MCV: 88.1 fL (ref 78.0–100.0)
PLATELETS: 202 10*3/uL (ref 150–400)
RBC: 4.19 MIL/uL (ref 3.87–5.11)
RDW: 13.8 % (ref 11.5–15.5)
WBC: 6.6 10*3/uL (ref 4.0–10.5)

## 2017-03-23 LAB — GLUCOSE, CAPILLARY: GLUCOSE-CAPILLARY: 216 mg/dL — AB (ref 65–99)

## 2017-03-23 LAB — CREATININE, SERUM
Creatinine, Ser: 0.88 mg/dL (ref 0.44–1.00)
GFR calc Af Amer: >60 mL/min (ref >60)
GFR calc non Af Amer: >60 mL/min (ref >60)

## 2017-03-23 LAB — CBG MONITORING, ED: Glucose-Capillary: 249 mg/dL — ABNORMAL HIGH (ref 65–99)

## 2017-03-23 MED ORDER — ASPIRIN EC 81 MG PO TBEC
81.0000 mg | DELAYED_RELEASE_TABLET | Freq: Every day | ORAL | Status: DC
  Administered 2017-03-23 – 2017-03-24 (×2): 81 mg via ORAL
  Filled 2017-03-23 (×2): qty 1

## 2017-03-23 MED ORDER — ALPRAZOLAM 0.5 MG PO TABS
0.5000 mg | ORAL_TABLET | Freq: Once | ORAL | Status: AC
  Administered 2017-03-23: 0.5 mg via ORAL
  Filled 2017-03-23: qty 1

## 2017-03-23 MED ORDER — ASPIRIN 325 MG PO TABS: 325.0000 mg | ORAL_TABLET | Freq: Every day | ORAL | Status: DC

## 2017-03-23 MED ORDER — IOPAMIDOL (ISOVUE-370) INJECTION 76%
100.0000 mL | Freq: Once | INTRAVENOUS | Status: AC | PRN
  Administered 2017-03-23: 80 mL via INTRAVENOUS

## 2017-03-23 MED ORDER — ACETAMINOPHEN 160 MG/5ML PO SOLN: 650.0000 mg | ORAL | Status: DC | PRN

## 2017-03-23 MED ORDER — ENOXAPARIN SODIUM 40 MG/0.4ML ~~LOC~~ SOLN
40.0000 mg | SUBCUTANEOUS | Status: DC
  Administered 2017-03-23: 40 mg via SUBCUTANEOUS
  Filled 2017-03-23: qty 0.4

## 2017-03-23 MED ORDER — HYDRALAZINE HCL 20 MG/ML IJ SOLN: 5.0000 mg | Freq: Four times a day (QID) | INTRAMUSCULAR | Status: DC | PRN

## 2017-03-23 MED ORDER — ACETAMINOPHEN 325 MG PO TABS
650.0000 mg | ORAL_TABLET | ORAL | Status: DC | PRN
  Administered 2017-03-23: 650 mg via ORAL
  Filled 2017-03-23: qty 2

## 2017-03-23 MED ORDER — ACETAMINOPHEN 650 MG RE SUPP: 650.0000 mg | RECTAL | Status: DC | PRN

## 2017-03-23 MED ORDER — INSULIN ASPART 100 UNIT/ML ~~LOC~~ SOLN
0.0000 [IU] | Freq: Three times a day (TID) | SUBCUTANEOUS | Status: DC
  Administered 2017-03-24: 2 [IU] via SUBCUTANEOUS

## 2017-03-23 MED ORDER — STROKE: EARLY STAGES OF RECOVERY BOOK
Freq: Once | Status: AC
  Administered 2017-03-23: 19:00:00

## 2017-03-23 MED ORDER — ATORVASTATIN CALCIUM 80 MG PO TABS
80.0000 mg | ORAL_TABLET | Freq: Every day | ORAL | Status: DC
  Administered 2017-03-23: 80 mg via ORAL
  Filled 2017-03-23: qty 1

## 2017-03-23 MED ORDER — INSULIN ASPART 100 UNIT/ML ~~LOC~~ SOLN
0.0000 [IU] | Freq: Every day | SUBCUTANEOUS | Status: DC
  Administered 2017-03-23: 2 [IU] via SUBCUTANEOUS

## 2017-03-23 MED ORDER — ASPIRIN 300 MG RE SUPP: 300.0000 mg | Freq: Every day | RECTAL | Status: DC

## 2017-03-23 MED ORDER — SENNOSIDES-DOCUSATE SODIUM 8.6-50 MG PO TABS: 1.0000 | ORAL_TABLET | Freq: Every evening | ORAL | Status: DC | PRN

## 2017-03-23 MED ORDER — CLOPIDOGREL BISULFATE 75 MG PO TABS
75.0000 mg | ORAL_TABLET | Freq: Every day | ORAL | Status: DC
  Administered 2017-03-24: 75 mg via ORAL
  Filled 2017-03-23: qty 1

## 2017-03-23 NOTE — ED Triage Notes (Signed)
Pt reports sudden onset of "real bad headache" around 30 minutes ago, headache resolved but she cont with pain to the left side and front of her neck, pt states this is the same sensation she had with her cva in October, pt states she has numbness to left side of her face. No drift or facial asymetry noted, speech is clear, pt taken directly to ct scan monitored by cindy, rn per protocol after fsbs and assisted into gown.

## 2017-03-23 NOTE — ED Notes (Signed)
ED Provider at bedside. 

## 2017-03-23 NOTE — H&P (Signed)
History and Physical    Nikolette Reindl JYN:[id number] DOB: 02-Aug-1940 DOA: 03/23/2017  PCP: [name], PA-C   Patient coming from: Home  I have personally briefly reviewed patient's old medical records in [facility]  Chief Complaint: Head and neck pain with facial numbness  HPI: Zayda Angell is a 77 y.o. female with medical history significant of CVA in October 2018, diabetes mellitus type 2, hypertension, hyperlipidemia and nephrolithiasis presented to med [facility] today with complaints of sudden onset of head and neck pain with left-sided facial numbness.  Patient's states that she started experiencing sudden onset of pain in the back of her head, very sharp radiating down towards her neck was started around 7:30 AM today.  She also noticed left-sided facial numbness and difficulty finding words.  She also felt that her left upper extremity was weak for a while.  No fever, nausea, vomiting, loss of consciousness, bowel bladder incontinence, seizure activities, blurring of vision.  Patient has history of CVA and is currently on Plavix.  She has been taken off aspirin by neurology as an outpatient.  ED Course: CT head showed no acute findings.  Patient was evaluated by telemetry neurology who recommended CT angiogram of the head and neck and transfer to [facility] for further neurology evaluation and workup.  At the time of my evaluation, patient feels better.  Her left side of face still feels numb but her left upper extremity does not feel that week.  She states that her headache is also improving, currently is at 4 out of 10.  Review of Systems: As per HPI otherwise 10 point review of systems negative.    Past Medical History:  Diagnosis Date  . Diabetes mellitus without complication (HCC)   . Hypertension   . Kidney stone   . Stroke (HCC)   . Vertigo     Past Surgical History:  Procedure Laterality Date  . ABDOMINAL HYSTERECTOMY    . KIDNEY STONE  SURGERY     Social history  reports that  has never smoked. she has never used smokeless tobacco. She reports that she does not drink alcohol or use drugs.  Lives at home with family  Allergies  Allergen Reactions  . Influenza Vaccines Swelling    Arm was swollen in 2002   . Sitagliptin Swelling  . Guaifenesin Other (See Comments)    Unknown  . Latex Other (See Comments)    Unknown  . Other Other (See Comments)    Band-Aid  . Phenylephrine-Pheniramine-Dm Other (See Comments)    Unknown  . Ultram [Tramadol Hcl] Other (See Comments)    hallucinations  . Escitalopram Oxalate Other (See Comments)    Hallucinations    Family History  Problem Relation Age of Onset  . Stroke Other   . Heart attack Other   . Cancer Other   . Diabetes Other   . Stroke Maternal Aunt   . Stroke Paternal Aunt     Prior to Admission medications   Medication Sig Start Date End Date Taking? Authorizing Provider  Dulaglutide (TRULICITY [place]) Inject into the skin.   Yes [provider]  amLODipine (NORVASC) 5 MG tablet Take 5 mg by mouth daily.    [provider]  aspirin 81 MG tablet Take 81 mg by mouth daily.    [provider]  atorvastatin (LIPITOR) 80 MG tablet Take 1 tablet (80 mg total) by mouth at bedtime. 10/25/16   Cathren Harsh, MD  canagliflozin (INVOKANA) 300 MG TABS tablet Take 300 mg by mouth. 12/08/15   [provider]  Cholecalciferol (VITAMIN D3) 5000 UNITS CAPS Take 1,000 Units by mouth.     [provider]  clopidogrel (PLAVIX) 75 MG tablet Take 1 tablet (75 mg total) by mouth daily. 10/26/16   Rai, Delene Ruffini, MD  furosemide (LASIX) 20 MG tablet Take 20 mg as needed by mouth.  09/08/16   [provider]  glyBURIDE (DIABETA) 5 MG tablet Take 5 mg by mouth 2 (two) times daily with a meal.    [provider]  Insulin Glargine (TOUJEO SOLOSTAR [place]) Inject 40 Units into the skin.    [provider]  lisinopril  (PRINIVIL,ZESTRIL) 20 MG tablet Take 20 mg by mouth daily. 09/09/16   [provider]  meclizine (ANTIVERT) 12.5 MG tablet Take 12.5 mg by mouth 3 (three) times daily as needed for dizziness. 10/21/16   [provider]  metFORMIN (GLUCOPHAGE) 1000 MG tablet Take 1,000 mg by mouth 2 (two) times daily with a meal.    [provider]  Omega-3 Fatty Acids (FISH OIL) 1000 MG CAPS Take daily by mouth.    [provider]  vitamin B-12 (CYANOCOBALAMIN) 1000 MCG tablet Take by mouth.    [provider]    Physical Exam: Vitals:   03/23/17 1300 03/23/17 1330 03/23/17 1400 03/23/17 1500  BP: (!) 143/66 126/64 127/66 (!) 123/55  Pulse: 77 76 74 76  Resp: (!) 21 20 (!) 23 15  Temp:      TempSrc:      SpO2: 99% 99% 98% 97%  Weight:      Height:        Constitutional: NAD, calm, comfortable Vitals:   03/23/17 1300 03/23/17 1330 03/23/17 1400 03/23/17 1500  BP: (!) 143/66 126/64 127/66 (!) 123/55  Pulse: 77 76 74 76  Resp: (!) 21 20 (!) 23 15  Temp:      TempSrc:      SpO2: 99% 99% 98% 97%  Weight:      Height:       Eyes: PERRL, lids and conjunctivae normal ENMT: Mucous membranes are moist. Posterior pharynx clear of any exudate or lesions.  Neck: normal, supple, no masses, no thyromegaly Respiratory: Bilateral decreased breath sounds at bases.  No wheezing.  No accessory muscle use Cardiovascular: Rate controlled, S1-S2 positive, no murmurs Abdomen: no tenderness, no masses palpated. No hepatosplenomegaly. Bowel sounds positive.  Musculoskeletal: no clubbing / cyanosis. No joint deformity upper and lower extremities.   Skin: no rashes, lesions, ulcers. No induration Neurologic: CN 2-12 grossly intact.  Power is 5/5 in all extremities.  Alert awake oriented. Psychiatric: Normal judgment and insight. Alert and oriented x 3. Normal mood.    Labs on Admission: I have personally reviewed following labs and imaging studies  CBC: Recent Labs  Lab  03/23/17 0850  WBC 6.9  NEUTROABS 3.1  HGB 13.1  HCT 39.7  MCV 87.8  PLT 199   Basic Metabolic Panel: Recent Labs  Lab 03/23/17 0850  NA 137  K 4.0  CL 106  CO2 19*  GLUCOSE 270*  BUN 19  CREATININE 0.69  CALCIUM 9.4   GFR: Estimated Creatinine Clearance: 59.2 mL/min (by C-G formula based on SCr of 0.69 mg/dL). Liver Function Tests: No results for input(s): AST, ALT, ALKPHOS, BILITOT, PROT, ALBUMIN in the last 168 hours. No results for input(s): LIPASE, AMYLASE in the last 168 hours. No results for input(s):  AMMONIA in the last 168 hours. Coagulation Profile: No results for input(s): INR, PROTIME in the last 168 hours. Cardiac Enzymes: No results for input(s): CKTOTAL, CKMB, CKMBINDEX, TROPONINI in the last 168 hours. BNP (last 3 results) No results for input(s): PROBNP in the last 8760 hours. HbA1C: No results for input(s): HGBA1C in the last 72 hours. CBG: Recent Labs  Lab 03/23/17 0831  GLUCAP 249*   Lipid Profile: No results for input(s): CHOL, HDL, LDLCALC, TRIG, CHOLHDL, LDLDIRECT in the last 72 hours. Thyroid Function Tests: No results for input(s): TSH, T4TOTAL, FREET4, T3FREE, THYROIDAB in the last 72 hours. Anemia Panel: No results for input(s): VITAMINB12, FOLATE, FERRITIN, TIBC, IRON, RETICCTPCT in the last 72 hours. Urine analysis:    Component Value Date/Time   COLORURINE YELLOW 10/28/2016 0736   APPEARANCEUR CLOUDY (A) 10/28/2016 0736   LABSPEC 1.007 10/28/2016 0736   PHURINE 5.0 10/28/2016 0736   GLUCOSEU >=500 (A) 10/28/2016 0736   HGBUR SMALL (A) 10/28/2016 0736   BILIRUBINUR NEGATIVE 10/28/2016 0736   KETONESUR NEGATIVE 10/28/2016 0736   PROTEINUR NEGATIVE 10/28/2016 0736   NITRITE NEGATIVE 10/28/2016 0736   LEUKOCYTESUR LARGE (A) 10/28/2016 0736    Radiological Exams on Admission: Ct Angio Head W Or Wo Contrast  Result Date: 03/23/2017 CLINICAL DATA:  77 year old female code stroke. Head pain and left facial numbness. EXAM: CT  ANGIOGRAPHY HEAD AND NECK TECHNIQUE: Multidetector CT imaging of the head and neck was performed using the standard protocol during bolus administration of intravenous contrast. Multiplanar CT image reconstructions and MIPs were obtained to evaluate the vascular anatomy. Carotid stenosis measurements (when applicable) are obtained utilizing NASCET criteria, using the distal internal carotid diameter as the denominator. CONTRAST:  80mL ISOVUE-370 IOPAMIDOL (ISOVUE-370) INJECTION 76% COMPARISON:  Head CT without contrast 0843 hours today. CTA head and neck and CT perfusion 10/24/2016. FINDINGS: CTA NECK Skeleton: Absent dentition. Stable cervical spine degeneration. No acute osseous abnormality identified. Upper chest: Negative visible lung apices. No superior mediastinal lymphadenopathy. Other neck: Partially retropharyngeal course of the carotids, more so the right. Otherwise negative. No neck mass or lymphadenopathy. Aortic arch: Stable arch with minimal atherosclerosis. Three vessel arch configuration. Stable great vessel origins without significant stenosis. Right carotid system: Negative right CCA aside from a partially retropharyngeal course. Calcified plaque of the proximal right ICA is stable without stenosis. Tortuous cervical right ICA with a kinked appearance, stable to the skull base. Left carotid system: Minimal calcified plaque at the left CCA origin without stenosis. Mildly tortuous left CCA. Mild soft and calcified plaque at the left carotid bifurcation is stable without stenosis. Tortuous cervical left ICA with a kinked appearance redemonstrated and stable to the skull base. Vertebral arteries: No proximal right subclavian artery stenosis. The right vertebral artery origin is stable and normal. Normal right vertebral artery to the skull base. Mild proximal left subclavian atherosclerosis without stenosis is stable. Left vertebral artery origin is normal. The vertebral arteries are fairly codominant.  The left vertebral artery is mildly tortuous but otherwise normal to the skull base. CTA HEAD Posterior circulation: Mild bilateral V4 segment calcified plaque without stenosis is stable. Both PICA origins remain patent. Patent vertebrobasilar junction without stenosis. Patent basilar artery without stenosis. Bilateral SCA and PCA origins remain normal. Posterior communicating arteries are diminutive or absent. Mild right PCA irregularity and stenosis in the distal P1 or P2 segment is stable (series 11, image 46). Distal PCA branches appear stable and patent. Anterior circulation: Both ICA siphons remain patent. Moderate calcified plaque  on the right is redemonstrated with no significant stenosis. Moderate to severe calcified plaque of the left ICA siphon is redemonstrated with severe supraclinoid segment stenosis (series 10, image 106). Both ophthalmic artery origins are normal. Patent carotid termini. Normal ACA origins. Anterior communicating artery is normal. Moderate to severe right ACA A2 segment irregularity and stenosis is stable (series 12, image 13). Patent left MCA M1 segment with chronic severe proximal M1 stenosis which appears stable (series 12, image 20). The left MCA bifurcation and left MCA branches appear stable with mild to moderate irregularity but no high-grade stenosis or left MCA branch occlusion identified. The right MCA M1 and right MCA bifurcation are patent without stenosis. Moderate chronic stenosis of the dominant right MCA M2 posterior branch is redemonstrated on series 11, image 46 and stable. Right MCA branches are stable with mild irregularity otherwise. Venous sinuses: Patent. Anatomic variants: None. Delayed phase: No abnormal enhancement identified. Stable gray-white matter differentiation since 0843 hours today, including chronic left parietal and occipital lobe encephalomalacia. Review of the MIP images confirms the above findings IMPRESSION: 1. Negative for emergent large  vessel occlusion. 2. Stable CTA Head and neck compared to 10/24/2016. There is mild extracranial but advanced intracranial atherosclerosis with the following significant stenoses: - moderate to severe Left ICA supraclinoid segment stenosis. - severe Left MCA M1 stenosis. - moderate to severe Right ACA A2 stenosis. - moderate Right MCA posterior right M2 branch stenosis. - mild to moderate Right PCA P2 stenosis. 3. Stable CT appearance of the brain since 0843 hours today. Electronically Signed   By: Odessa Fleming M.D.   On: 03/23/2017 10:07   Ct Angio Neck W And/or Wo Contrast  Result Date: 03/23/2017 CLINICAL DATA:  77 year old female code stroke. Head pain and left facial numbness. EXAM: CT ANGIOGRAPHY HEAD AND NECK TECHNIQUE: Multidetector CT imaging of the head and neck was performed using the standard protocol during bolus administration of intravenous contrast. Multiplanar CT image reconstructions and MIPs were obtained to evaluate the vascular anatomy. Carotid stenosis measurements (when applicable) are obtained utilizing NASCET criteria, using the distal internal carotid diameter as the denominator. CONTRAST:  80mL ISOVUE-370 IOPAMIDOL (ISOVUE-370) INJECTION 76% COMPARISON:  Head CT without contrast 0843 hours today. CTA head and neck and CT perfusion 10/24/2016. FINDINGS: CTA NECK Skeleton: Absent dentition. Stable cervical spine degeneration. No acute osseous abnormality identified. Upper chest: Negative visible lung apices. No superior mediastinal lymphadenopathy. Other neck: Partially retropharyngeal course of the carotids, more so the right. Otherwise negative. No neck mass or lymphadenopathy. Aortic arch: Stable arch with minimal atherosclerosis. Three vessel arch configuration. Stable great vessel origins without significant stenosis. Right carotid system: Negative right CCA aside from a partially retropharyngeal course. Calcified plaque of the proximal right ICA is stable without stenosis. Tortuous  cervical right ICA with a kinked appearance, stable to the skull base. Left carotid system: Minimal calcified plaque at the left CCA origin without stenosis. Mildly tortuous left CCA. Mild soft and calcified plaque at the left carotid bifurcation is stable without stenosis. Tortuous cervical left ICA with a kinked appearance redemonstrated and stable to the skull base. Vertebral arteries: No proximal right subclavian artery stenosis. The right vertebral artery origin is stable and normal. Normal right vertebral artery to the skull base. Mild proximal left subclavian atherosclerosis without stenosis is stable. Left vertebral artery origin is normal. The vertebral arteries are fairly codominant. The left vertebral artery is mildly tortuous but otherwise normal to the skull base. CTA HEAD Posterior circulation: Mild  bilateral V4 segment calcified plaque without stenosis is stable. Both PICA origins remain patent. Patent vertebrobasilar junction without stenosis. Patent basilar artery without stenosis. Bilateral SCA and PCA origins remain normal. Posterior communicating arteries are diminutive or absent. Mild right PCA irregularity and stenosis in the distal P1 or P2 segment is stable (series 11, image 46). Distal PCA branches appear stable and patent. Anterior circulation: Both ICA siphons remain patent. Moderate calcified plaque on the right is redemonstrated with no significant stenosis. Moderate to severe calcified plaque of the left ICA siphon is redemonstrated with severe supraclinoid segment stenosis (series 10, image 106). Both ophthalmic artery origins are normal. Patent carotid termini. Normal ACA origins. Anterior communicating artery is normal. Moderate to severe right ACA A2 segment irregularity and stenosis is stable (series 12, image 13). Patent left MCA M1 segment with chronic severe proximal M1 stenosis which appears stable (series 12, image 20). The left MCA bifurcation and left MCA branches appear  stable with mild to moderate irregularity but no high-grade stenosis or left MCA branch occlusion identified. The right MCA M1 and right MCA bifurcation are patent without stenosis. Moderate chronic stenosis of the dominant right MCA M2 posterior branch is redemonstrated on series 11, image 46 and stable. Right MCA branches are stable with mild irregularity otherwise. Venous sinuses: Patent. Anatomic variants: None. Delayed phase: No abnormal enhancement identified. Stable gray-white matter differentiation since 0843 hours today, including chronic left parietal and occipital lobe encephalomalacia. Review of the MIP images confirms the above findings IMPRESSION: 1. Negative for emergent large vessel occlusion. 2. Stable CTA Head and neck compared to 10/24/2016. There is mild extracranial but advanced intracranial atherosclerosis with the following significant stenoses: - moderate to severe Left ICA supraclinoid segment stenosis. - severe Left MCA M1 stenosis. - moderate to severe Right ACA A2 stenosis. - moderate Right MCA posterior right M2 branch stenosis. - mild to moderate Right PCA P2 stenosis. 3. Stable CT appearance of the brain since 0843 hours today. Electronically Signed   By: Odessa FlemingH  Hall M.D.   On: 03/23/2017 10:07   Ct Head Code Stroke Wo Contrast`  Result Date: 03/23/2017 CLINICAL DATA:  Code stroke. Sharp pain in back of head beginning 1 hour ago. Left facial numbness. EXAM: CT HEAD WITHOUT CONTRAST TECHNIQUE: Contiguous axial images were obtained from the base of the skull through the vertex without intravenous contrast. COMPARISON:  10/28/2016 FINDINGS: Brain: No evidence of acute infarction, hemorrhage, hydrocephalus, extra-axial collection or mass lesion/mass effect. There is a sizable remote left temporoparietal and occipital infarct with expected evolution since prior. Chronic small vessel ischemic change in the cerebral white matter without detected progression. Vascular: Atherosclerotic  calcification.  No hyperdense vessel. Skull: Negative Sinuses/Orbits: Bilateral cataract resection.  No acute finding. Other: These results were called by telephone at the time of interpretation on 03/23/2017 at 8:47 am to Dr. Doug SouSAM JACUBOWITZ , who verbally acknowledged these results. ASPECTS [facility]([place] Stroke Program Early CT Score) - Ganglionic level infarction (caudate, lentiform nuclei, internal capsule, insula, M1-M3 cortex): 7 - Supraganglionic infarction (M4-M6 cortex): 3 Total score (0-10 with 10 being normal): 10 IMPRESSION: 1. No acute finding. 2. Remote left posterior cerebral infarct. Electronically Signed   By: Marnee SpringJonathon  Watts M.D.   On: 03/23/2017 08:48     Assessment/Plan Active Problems:   Essential hypertension   Diabetes mellitus with complication (HCC)   CVA (cerebral vascular accident) [facility](HCC)   Neurological symptoms   Probable TIA presenting with headache/facial numbness and left upper extremity mild weakness  in a patient with history of CVA -Rule out stroke. -Initial CT of the head did not show any acute abnormality.  CT angiogram of the head and neck done as per telemetry neurology recommendation shows significant intracranial stenosis but stable compared to prior CT angiogram from October 2018. -MRI of the brain.  No need for MRA of the brain as CT angiogram has already been done. -Echocardiogram -PT/OT/SLP evaluation -Continue Plavix.  Add aspirin.  Continue statin.  Hemoglobin A1c and lipid profile in a.m.  Hypertension -Monitor blood pressure.  Hold blood pressure medications for now.  Use IV antihypertensives if needed  Diabetes mellitus type 2 -Hold oral meds.  Accu-Cheks with coverage.  Hemoglobin A1c in a.m.  Hyperlipidemia -Continue statin  DVT prophylaxis: Lovenox Code Status: Full Family Communication: Spoke to husband at bedside Disposition Plan: Home in 1-2 days Consults called: Neurology Admission status: Observation in telemetry  Severity of  Illness: The appropriate patient status for this patient is OBSERVATION. Observation status is judged to be reasonable and necessary in order to provide the required intensity of service to ensure the patient's safety. The patient's presenting symptoms, physical exam findings, and initial radiographic and laboratory data in the context of their medical condition is felt to place them at decreased risk for further clinical deterioration. Furthermore, it is anticipated that the patient will be medically stable for discharge from the hospital within 2 midnights of admission. The following factors support the patient status of observation.   " The patient's presenting symptoms include headache and facial numbness. " The physical exam findings include neurologically intact. " The initial radiographic and laboratory data are CT brain was negative for acute abnormality.      Glade Lloyd MD [facility] Pager [phone]  If 7PM-7AM, please contact night-coverage [email or web address] Password [facility]  03/23/2017, 5:29 PM

## 2017-03-23 NOTE — Consult Note (Addendum)
NEUROHOSPITALISTS - CONSULTATION NOTE   Requesting Physician: Dr. Ophelia CharterYates [facility]ist: Dr. Laurence SlateAroor  Admit date: 03/23/2017    Chief Complaint:   History obtained from:   Patient and Chart    HPI   Jill Howard is an 77 y.o. 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 She went to the hospital this morning after experiencing acute onset H/A and neck pain. Patient states she woke up at approximately 06:30, ate breakfast and then suddenly had a "really bad, knife-like, headache", pain/numbness on the left side of her face/neck and a brief period of Right arm weakness.   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 No  tPA was given due to low NIHSS and nondisabling symptoms. [facility] and stroke workup. Initial Head CT was negative for acute stroke. CTA Head/Neck was negative for dissection  Currently, she is complaining of mild Left facial numbness, intermittent 4/10, left sided, occipital, H/A which she says seems to get worse if her B/P elevates. She also has some very mild left sided neck pain but this is improving. She says her baseline Right peripheral vision has worsened since yesterday. She denies any difficulty speaking, difficulty with coordination, denies any new weakness or numbness. She denies any recent illness or travel. States she has slept very poorly over the past 3 days. She also admits that she occasional feels like her heart is racing when she is laying in bed.  Patient states she last followed up with Neurology/Dr Pearlean BrownieSethi in mid January and was taken off her baby ASA at that time. She continues to take Plavix daily and took her dose  this morning. She tells me she is a research patient but did not get chosen for the Loop recorder placement.  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  Baseline Findings: mild right hand weakness and VF cut on the right/Left with mild difficulty with word finding.   Date last known well: Date: 03/23/2017 Time last known well: Time: 07:30 tPA Given: No:  Modified Rankin: Rankin Score=0 NIHSS: 2 .  Past Medical History: She  has a past medical history of Diabetes mellitus without complication (HCC), Hypertension, Kidney stone, Stroke (HCC), and Vertigo., Hx chronic pain, Hx of a gastric ulcer 7 years ago  Past Surgical History: She  has a past surgical history that includes Abdominal hysterectomy and Kidney stone surgery.  Family History:  indicated that her mother is deceased. She indicated that her father is deceased. She indicated that her maternal aunt is deceased. She indicated that her paternal aunt is deceased. She indicated that the status of her other is unknown. Mother had CVA  Social History:  She  reports that  has never smoked. she has never used smokeless tobacco. She reports that she does not drink alcohol or use drugs.  Allergies:  Allergies  Allergen Reactions  . Influenza Vaccines Swelling    Arm was swollen in 2002   . Sitagliptin Swelling  . Guaifenesin Other (See Comments)    Unknown  . Latex Other (See Comments)    Unknown  . Other Other (See Comments)  Band-Aid  . Phenylephrine-Pheniramine-Dm Other (See Comments)    Unknown  . Ultram [Tramadol Hcl] Other (See Comments)    hallucinations  . Escitalopram Oxalate Other (See Comments)    Hallucinations   Medications: Prior to Admission:  Medications Prior to Admission  Medication Sig Dispense Refill Last Dose  . Dulaglutide (TRULICITY [place]) Inject into the skin.     Marland Kitchen amLODipine (NORVASC) 5 MG tablet Take 5 mg by mouth daily.   Taking  . aspirin 81 MG tablet Take 81  mg by mouth daily.   Taking  . atorvastatin (LIPITOR) 80 MG tablet Take 1 tablet (80 mg total) by mouth at bedtime. 30 tablet 3 Taking  . canagliflozin (INVOKANA) 300 MG TABS tablet Take 300 mg by mouth.   Taking  . Cholecalciferol (VITAMIN D3) 5000 UNITS CAPS Take 1,000 Units by mouth.    Taking  . clopidogrel (PLAVIX) 75 MG tablet Take 1 tablet (75 mg total) by mouth daily. 30 tablet 4 Taking  . furosemide (LASIX) 20 MG tablet Take 20 mg as needed by mouth.    Taking  . glyBURIDE (DIABETA) 5 MG tablet Take 5 mg by mouth 2 (two) times daily with a meal.   Taking  . Insulin Glargine (TOUJEO SOLOSTAR [place]) Inject 40 Units into the skin.   Taking  . lisinopril (PRINIVIL,ZESTRIL) 20 MG tablet Take 20 mg by mouth daily.   Taking  . meclizine (ANTIVERT) 12.5 MG tablet Take 12.5 mg by mouth 3 (three) times daily as needed for dizziness.   Taking  . metFORMIN (GLUCOPHAGE) 1000 MG tablet Take 1,000 mg by mouth 2 (two) times daily with a meal.   Taking  . Omega-3 Fatty Acids (FISH OIL) 1000 MG CAPS Take daily by mouth.   Taking  . vitamin B-12 (CYANOCOBALAMIN) 1000 MCG tablet Take by mouth.   Taking    ROS: History obtained from the patient General ROS: + fatigue,  Psychological ROS: negative for - behavioral disorder, hallucinations, memory difficulties, mood swings or suicidal ideation Ophthalmic ROS: + worsening of peripheral vision right eye ENT ROS: negative for - epistaxis, nasal discharge, oral lesions, sore throat, tinnitus or vertigo Allergy and Immunology ROS: negative for - hives or itchy/watery eyes Hematological and Lymphatic ROS: negative for - bleeding problems, bruising or swollen lymph nodes Endocrine ROS: negative for - galactorrhea, hair pattern changes, polydipsia/polyuria or temperature intolerance Respiratory ROS: negative for - cough, hemoptysis, shortness of breath or wheezing Cardiovascular ROS:+irregular heartbeat  Gastrointestinal ROS: negative for - abdominal pain,  diarrhea, hematemesis, nausea/vomiting or stool incontinence Genito-Urinary ROS: negative for - dysuria, hematuria, incontinence or urinary frequency/urgency Musculoskeletal ROS: negative for - joint swelling or muscular weakness Neurological ROS: as noted in HPI Dermatological ROS: negative for rash and skin lesion changes  Physical Examination: Vitals:   03/23/17 1300 03/23/17 1330 03/23/17 1400 03/23/17 1500  BP: (!) 143/66 126/64 127/66 (!) 123/55  Pulse: 77 76 74 76  Resp: (!) 21 20 (!) 23 15  Temp:      TempSrc:      SpO2: 99% 99% 98% 97%  Weight:      Height:       HEENT-  Normocephalic, Normal external eye/conjunctiva.  Normal external ears. Normal external nose, mucus membranes and septum.   Cardiovascular - Regular rate and rhythm  Respiratory - Lungs clear bilaterally. Non-labored breathing, No wheezing. Abdomen - soft and non-tender, BS normal Extremities- no edema or cyanosis Skin-warm and dry  Neurological Examination Mental Status: Alert, oriented,  thought content appropriate.  Speech fluent without evidence of aphasia or neglect.  Able to follow 3 step commands without difficulty. Cranial Nerves: II: Visual Fields- Bilateral visual field cut - baseline finding. Right peripheral vision worsened per patient. Pupils are equal, round, and reactive to light.   III,IV, VI: EOMI without ptosis or diploplia. Rotational and horizontal nystagmus.   V: Facial sensation is decreased on left VII: Facial movement is symmetric.  VIII: hearing is intact to voice X: Uvula elevates symmetrically XI: Shoulder shrug is symmetric. XII: tongue is midline without atrophy or fasciculations.  Motor: Tone is normal. Bulk is normal. 5/5 strength was present in all four extremities.  Sensor: Sensation is symmetric to light touch and temperature in the arms and legs. Deep Tendon Reflexes: 3+LE and 2+ UE's Plantars: Toes are downgoing bilaterally.  Cerebellar: normal finger-to-nose, normal  rapid alternating movements and normal heel-to-shin test Gait: normal gait and station   Lab Results: CBC: Recent Labs  Lab 03/23/17 0850  WBC 6.9  HGB 13.1  HCT 39.7  MCV 87.8  PLT 199   Basic Metabolic Panel: Recent Labs  Lab 03/23/17 0850  NA 137  K 4.0  CL 106  CO2 19*  GLUCOSE 270*  BUN 19  CREATININE 0.69  CALCIUM 9.4   Liver Function Tests: No results for input(s): AST, ALT, ALKPHOS, BILITOT, PROT, ALBUMIN in the last 168 hours. No results for input(s): LIPASE, AMYLASE in the last 168 hours. No results for input(s): AMMONIA in the last 168 hours. Thyroid Function Studies: No results for input(s): TSH, T4TOTAL, T3FREE, THYROIDAB in the last 72 hours. Cardiac Enzymes: No results for input(s): CKTOTAL, CKMB, CKMBINDEX, TROPONINI in the last 168 hours. Coagulation Studies: No results for input(s): APTT, INR in the last 72 hours. No results for input(s): DDIMER in the last 72 hours. [place] Work -Up No results for input(s): VITAMINB12, FOLATE, IRON, RETICCTPCT in the last 72 hours. Lipid Panel: No results for input(s): CHOL, TRIG, HDL, CHOLHDL, VLDL, LDLCALC in the last 168 hours. CBG & HgbA1C: Recent Labs  Lab 03/23/17 0831  GLUCAP 249*   No results for input(s): HGBA1C in the last 72 hours. Sepsis Labs: Recent Labs  Lab 03/23/17 0850  WBC 6.9   Microbiology: No results found for this or any previous visit (from the past 240 hour(s)). Urinalysis: No results for input(s): COLORURINE, APPEARANCEUR, LABSPEC, PHURINE, GLUCOSEU, HGBUR, BILIRUBINUR, KETONESUR, PROTEINUR, UROBILINOGEN, NITRITE, LEUKOCYTESUR in the last 168 hours. Urine Drug Screen:      Component Value Date/Time   LABOPIA NONE DETECTED 10/28/2016 0736   COCAINSCRNUR NONE DETECTED 10/28/2016 0736   LABBENZ NONE DETECTED 10/28/2016 0736   AMPHETMU NONE DETECTED 10/28/2016 0736   THCU NONE DETECTED 10/28/2016 0736   LABBARB NONE DETECTED 10/28/2016 0736    Alcohol Level: No results for  input(s): ETH in the last 168 hours.  Imaging: Ct Angio Head W Or Wo Contrast  Result Date: 03/23/2017 CLINICAL DATA:  77 year old female code stroke. Head pain and left facial numbness. EXAM: CT ANGIOGRAPHY HEAD AND NECK TECHNIQUE: Multidetector CT imaging of the head and neck was performed using the standard protocol during bolus administration of intravenous contrast. Multiplanar CT image reconstructions and MIPs were obtained to evaluate the vascular anatomy. Carotid stenosis measurements (when applicable) are obtained utilizing NASCET criteria, using the distal internal carotid diameter as the denominator. CONTRAST:  80mL ISOVUE-370 IOPAMIDOL (ISOVUE-370) INJECTION 76% COMPARISON:  Head CT without contrast 0843 hours today. CTA head and neck and CT perfusion  10/24/2016. FINDINGS: CTA NECK Skeleton: Absent dentition. Stable cervical spine degeneration. No acute osseous abnormality identified. Upper chest: Negative visible lung apices. No superior mediastinal lymphadenopathy. Other neck: Partially retropharyngeal course of the carotids, more so the right. Otherwise negative. No neck mass or lymphadenopathy. Aortic arch: Stable arch with minimal atherosclerosis. Three vessel arch configuration. Stable great vessel origins without significant stenosis. Right carotid system: Negative right CCA aside from a partially retropharyngeal course. Calcified plaque of the proximal right ICA is stable without stenosis. Tortuous cervical right ICA with a kinked appearance, stable to the skull base. Left carotid system: Minimal calcified plaque at the left CCA origin without stenosis. Mildly tortuous left CCA. Mild soft and calcified plaque at the left carotid bifurcation is stable without stenosis. Tortuous cervical left ICA with a kinked appearance redemonstrated and stable to the skull base. Vertebral arteries: No proximal right subclavian artery stenosis. The right vertebral artery origin is stable and normal. Normal  right vertebral artery to the skull base. Mild proximal left subclavian atherosclerosis without stenosis is stable. Left vertebral artery origin is normal. The vertebral arteries are fairly codominant. The left vertebral artery is mildly tortuous but otherwise normal to the skull base. CTA HEAD Posterior circulation: Mild bilateral V4 segment calcified plaque without stenosis is stable. Both PICA origins remain patent. Patent vertebrobasilar junction without stenosis. Patent basilar artery without stenosis. Bilateral SCA and PCA origins remain normal. Posterior communicating arteries are diminutive or absent. Mild right PCA irregularity and stenosis in the distal P1 or P2 segment is stable (series 11, image 46). Distal PCA branches appear stable and patent. Anterior circulation: Both ICA siphons remain patent. Moderate calcified plaque on the right is redemonstrated with no significant stenosis. Moderate to severe calcified plaque of the left ICA siphon is redemonstrated with severe supraclinoid segment stenosis (series 10, image 106). Both ophthalmic artery origins are normal. Patent carotid termini. Normal ACA origins. Anterior communicating artery is normal. Moderate to severe right ACA A2 segment irregularity and stenosis is stable (series 12, image 13). Patent left MCA M1 segment with chronic severe proximal M1 stenosis which appears stable (series 12, image 20). The left MCA bifurcation and left MCA branches appear stable with mild to moderate irregularity but no high-grade stenosis or left MCA branch occlusion identified. The right MCA M1 and right MCA bifurcation are patent without stenosis. Moderate chronic stenosis of the dominant right MCA M2 posterior branch is redemonstrated on series 11, image 46 and stable. Right MCA branches are stable with mild irregularity otherwise. Venous sinuses: Patent. Anatomic variants: None. Delayed phase: No abnormal enhancement identified. Stable gray-white matter  differentiation since 0843 hours today, including chronic left parietal and occipital lobe encephalomalacia. Review of the MIP images confirms the above findings IMPRESSION: 1. Negative for emergent large vessel occlusion. 2. Stable CTA Head and neck compared to 10/24/2016. There is mild extracranial but advanced intracranial atherosclerosis with the following significant stenoses: - moderate to severe Left ICA supraclinoid segment stenosis. - severe Left MCA M1 stenosis. - moderate to severe Right ACA A2 stenosis. - moderate Right MCA posterior right M2 branch stenosis. - mild to moderate Right PCA P2 stenosis. 3. Stable CT appearance of the brain since 0843 hours today. Electronically Signed   By: Odessa Fleming M.D.   On: 03/23/2017 10:07   Ct Angio Neck W And/or Wo Contrast  Result Date: 03/23/2017 CLINICAL DATA:  77 year old female code stroke. Head pain and left facial numbness. EXAM: CT ANGIOGRAPHY HEAD AND NECK TECHNIQUE: Multidetector CT  imaging of the head and neck was performed using the standard protocol during bolus administration of intravenous contrast. Multiplanar CT image reconstructions and MIPs were obtained to evaluate the vascular anatomy. Carotid stenosis measurements (when applicable) are obtained utilizing NASCET criteria, using the distal internal carotid diameter as the denominator. CONTRAST:  80mL ISOVUE-370 IOPAMIDOL (ISOVUE-370) INJECTION 76% COMPARISON:  Head CT without contrast 0843 hours today. CTA head and neck and CT perfusion 10/24/2016. FINDINGS: CTA NECK Skeleton: Absent dentition. Stable cervical spine degeneration. No acute osseous abnormality identified. Upper chest: Negative visible lung apices. No superior mediastinal lymphadenopathy. Other neck: Partially retropharyngeal course of the carotids, more so the right. Otherwise negative. No neck mass or lymphadenopathy. Aortic arch: Stable arch with minimal atherosclerosis. Three vessel arch configuration. Stable great vessel  origins without significant stenosis. Right carotid system: Negative right CCA aside from a partially retropharyngeal course. Calcified plaque of the proximal right ICA is stable without stenosis. Tortuous cervical right ICA with a kinked appearance, stable to the skull base. Left carotid system: Minimal calcified plaque at the left CCA origin without stenosis. Mildly tortuous left CCA. Mild soft and calcified plaque at the left carotid bifurcation is stable without stenosis. Tortuous cervical left ICA with a kinked appearance redemonstrated and stable to the skull base. Vertebral arteries: No proximal right subclavian artery stenosis. The right vertebral artery origin is stable and normal. Normal right vertebral artery to the skull base. Mild proximal left subclavian atherosclerosis without stenosis is stable. Left vertebral artery origin is normal. The vertebral arteries are fairly codominant. The left vertebral artery is mildly tortuous but otherwise normal to the skull base. CTA HEAD Posterior circulation: Mild bilateral V4 segment calcified plaque without stenosis is stable. Both PICA origins remain patent. Patent vertebrobasilar junction without stenosis. Patent basilar artery without stenosis. Bilateral SCA and PCA origins remain normal. Posterior communicating arteries are diminutive or absent. Mild right PCA irregularity and stenosis in the distal P1 or P2 segment is stable (series 11, image 46). Distal PCA branches appear stable and patent. Anterior circulation: Both ICA siphons remain patent. Moderate calcified plaque on the right is redemonstrated with no significant stenosis. Moderate to severe calcified plaque of the left ICA siphon is redemonstrated with severe supraclinoid segment stenosis (series 10, image 106). Both ophthalmic artery origins are normal. Patent carotid termini. Normal ACA origins. Anterior communicating artery is normal. Moderate to severe right ACA A2 segment irregularity and  stenosis is stable (series 12, image 13). Patent left MCA M1 segment with chronic severe proximal M1 stenosis which appears stable (series 12, image 20). The left MCA bifurcation and left MCA branches appear stable with mild to moderate irregularity but no high-grade stenosis or left MCA branch occlusion identified. The right MCA M1 and right MCA bifurcation are patent without stenosis. Moderate chronic stenosis of the dominant right MCA M2 posterior branch is redemonstrated on series 11, image 46 and stable. Right MCA branches are stable with mild irregularity otherwise. Venous sinuses: Patent. Anatomic variants: None. Delayed phase: No abnormal enhancement identified. Stable gray-white matter differentiation since 0843 hours today, including chronic left parietal and occipital lobe encephalomalacia. Review of the MIP images confirms the above findings IMPRESSION: 1. Negative for emergent large vessel occlusion. 2. Stable CTA Head and neck compared to 10/24/2016. There is mild extracranial but advanced intracranial atherosclerosis with the following significant stenoses: - moderate to severe Left ICA supraclinoid segment stenosis. - severe Left MCA M1 stenosis. - moderate to severe Right ACA A2 stenosis. - moderate Right  MCA posterior right M2 branch stenosis. - mild to moderate Right PCA P2 stenosis. 3. Stable CT appearance of the brain since 0843 hours today. Electronically Signed   By: Odessa Fleming M.D.   On: 03/23/2017 10:07   Ct Head Code Stroke Wo Contrast`  Result Date: 03/23/2017 CLINICAL DATA:  Code stroke. Sharp pain in back of head beginning 1 hour ago. Left facial numbness. EXAM: CT HEAD WITHOUT CONTRAST TECHNIQUE: Contiguous axial images were obtained from the base of the skull through the vertex without intravenous contrast. COMPARISON:  10/28/2016 FINDINGS: Brain: No evidence of acute infarction, hemorrhage, hydrocephalus, extra-axial collection or mass lesion/mass effect. There is a sizable remote  left temporoparietal and occipital infarct with expected evolution since prior. Chronic small vessel ischemic change in the cerebral white matter without detected progression. Vascular: Atherosclerotic calcification.  No hyperdense vessel. Skull: Negative Sinuses/Orbits: Bilateral cataract resection.  No acute finding. Other: These results were called by telephone at the time of interpretation on 03/23/2017 at 8:47 am to Dr. Doug Sou , who verbally acknowledged these results. ASPECTS [facility] Stroke Program Early CT Score) - Ganglionic level infarction (caudate, lentiform nuclei, internal capsule, insula, M1-M3 cortex): 7 - Supraganglionic infarction (M4-M6 cortex): 3 Total score (0-10 with 10 being normal): 10 IMPRESSION: 1. No acute finding. 2. Remote left posterior cerebral infarct. Electronically Signed   By: Marnee Spring M.D.   On: 03/23/2017 08:48   ECHO 10/25/2016 Study Conclusions - Left ventricle: The cavity size was normal. Wall thickness was   normal. Systolic function was vigorous. The estimated ejection   fraction was in the range of 65% to 70%. Wall motion was normal;   there were no regional wall motion abnormalities. Doppler   parameters are consistent with abnormal left ventricular   relaxation (grade 1 diastolic dysfunction).    IMPRESSION: Jill Howard is a 77 y.o. 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 All imaging thus far negative for acute findings.  Likely TIA:  Suspected Etiology: Likely small vessel versus cardioembolic Resultant Symptoms: Left facial numbness Stroke Risk Factors: carotid stenosis, diabetes mellitus, hyperlipidemia and hypertension Other Stroke Risk Factors: Advanced age, Hx stroke  Outstanding Stroke Work-up  Studies:     Echocardiogram:                                                    PENDING B/L Carotid U/S:                                                     PENDING  PLAN  03/23/2017: Patient will be admitted to the hospitalists and stroke workup will be obtained. Frequent Neurochecks  Telemetry Monitoring NPO until passes Stroke swallow screen Continue Aspirin/ Plavix, will add back ASA 81 mg (not 325 mg)  due to Hx of ulcer Continue Atorvastatin 80 mg HgbA1C and Lipid Profile PT/OT/SLP Consult PM & Rehab Consult Case Management /MSW May need TEE and Loop Recorder  Placement to rule out AFIB Ongoing aggressive stroke risk factor management Patient will be counseled to be compliant with her antithrombotic medications Patient will be counseled on Lifestyle modifications including, Diet, Exercise, and Stress Follow up with [facility] in 6 weeks  HX OF STROKES: 10/24/2016: Scattered areas of acute ischemia within the left MCA distribution, greatest in the posterior left parietal and temporal lobes  Baseline Findings: mild right hand weakness and VF cut on the right with mild difficulty with word finding.   INTRACRANIAL Atherosclerosis &Stenosis: moderate to severe Left ICA supraclinoid segment stenosis. - severe Left MCA M1 stenosis. - moderate to severe Right ACA A2 stenosis. - moderate Right MCA posterior right M2 branch stenosis. - mild to moderate Right PCA P2 stenosis.  DAPT resumed  DYSPHAGIA: NPO until passes SLP swallow evaluation Aspiration Precautions in progress  R/O AFIB: May need TEE and Loop Recorder Placement  HYPERTENSION: Vitals:   03/23/17 1300 03/23/17 1330 03/23/17 1400 03/23/17 1500  BP: (!) 143/66 126/64 127/66 (!) 123/55  Pulse: 77 76 74 76  Resp: (!) 21 20 (!) 23 15  Temp:      TempSrc:      SpO2: 99% 99% 98% 97%  Weight:      Height:      Stable, Avoid Hypotension and Dehydration Permissive hypertension (OK if <220/120) for  24-48 hours post stroke and then gradually normalized within 5-7 days. Labetolol PRN Long term BP goal normotensive. May slowly restart home B/P medications after 48 hours Home Meds: Multiple  HYPERLIPIDEMIA:   CURRENT LIPID PANEL PENDING    Component Value Date/Time   CHOL 146 10/25/2016 0447   TRIG 161 (H) 10/25/2016 0447   HDL 33 (L) 10/25/2016 0447   CHOLHDL 4.4 10/25/2016 0447   VLDL 32 10/25/2016 0447   LDLCALC 81 10/25/2016 0447  Home Meds:  Lipitor 80 mg LDL  goal < 70 Continued on  Lipitor to 80 mg daily for now Continue statin at discharge  DIABETES:  CURRENT HGA1C PENDING Lab Results  Component Value Date   HGBA1C 7.0 (H) 10/24/2016   Recent Labs  Lab 03/23/17 0831  GLUCAP 249*  HgbA1c goal < 7.0 Currently on: Novolog Continue CBG monitoring and SSI to maintain glucose 140-180 mg/dl DM education   Other Active Problems: Active Problems:   Neurological symptoms    Hospital day # 0 VTE prophylaxis: SCD's  Diet - No diet orders on file   FAMILY UPDATES: family at bedside  TEAM UPDATES:Yates, Victorino Dike, MD   Prior Home Stroke Medications:  aspirin 81 mg daily and clopidogrel 75 mg daily  Discharge Stroke Meds:  Please discharge patient on TBD   Disposition: 01-Home or Self Care Therapy Recs:               PENDING Home Equipment:         PENDING Follow Up:  Herma Ard -PCP Follow up in 1-2 weeks      Assessment and plan discussed with with attending physician and they are in agreement.    Beryl Meager, ANP-C [facility] 03/23/2017, 5:06 PM    03/23/2017 ATTENDING ASSESSMENT:    I agree with the above note. Patient's symptoms little unusual- starting off with the sharp shooting headache, later numbness over the left cheek and transient numbness over left arm. She does have a history of migraines in the past however this is not typical. She also history of strokes TIA and severe iCAD bilaterally. MRI  brain shows no acute  stroke.  I think differentials include TIA from critical intracranial atherosclerotic disease versus complex migraine phenomenon. I will restart aspirin in addition to Plavix.    Please page stroke NP  Or  PA  Or MD from 8am -4 pm  as this patient from this time will be  followed by the stroke.   You can look them up on [email or web address]  Password [email or web address]

## 2017-03-23 NOTE — Progress Notes (Signed)
Called by Dr. Ethelda ChickJacubowitz regarding 77yo female with h/o CVA presenting with "weird symptoms" this AM.  Posterior headache, neck pain, numbness in left cheek.  Exam normal.  CT negative.  CTA head/neck with moderate occlusion, no stenosis.  Teleneurology consulted and recommends admission for stroke evaluation.  Dr. Laurence SlateAroor is aware of the patient.  Per teleneurology recommendation, there does not appear to be need for Echo/carotid dopplers at this time.  Will place in observation with telemetry overnight.  Georgana CurioJennifer E. Vianka Ertel, M.D.

## 2017-03-23 NOTE — ED Provider Notes (Signed)
[facility] EMERGENCY DEPARTMENT Provider Note   CSN: [id number] Arrival date & time: 03/23/17  4098     History   Chief Complaint Chief Complaint  Patient presents with  . Numbness  . Neck Pain    HPI Jill Howard is a 77 y.o. female.  Patient developed sudden onset occipital headache and posterior left-sided neck pain coated by numbness in left cheek onset 7:30 AM today.  She denies any difficulty speaking denies any difficulty with coordination no focal weakness.  No visual change.  She presently has no pain only complains of numbness in her left cheek.  Symptoms similar to TIA she has had in the past.  Patient is no longer on aspirin.  She did take her Plavix this morning  HPI  Past Medical History:  Diagnosis Date  . Diabetes mellitus without complication (HCC)   . Hypertension   . Kidney stone   . Stroke (HCC)   . Vertigo     Patient Active Problem List   Diagnosis Date Noted  . CVA (cerebral vascular accident) (HCC) 10/25/2016  . 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 Essential hypertension 10/24/2016  . Diabetes mellitus with complication (HCC) 10/24/2016  . Chronic pain 10/24/2016    Past Surgical History:  Procedure Laterality Date  . ABDOMINAL HYSTERECTOMY    . KIDNEY STONE SURGERY      OB History    No data available       Home Medications    Prior to Admission medications   Medication Sig Start Date End Date Taking? Authorizing Provider  Dulaglutide (TRULICITY [place]) Inject into the skin.   Yes [provider]  amLODipine (NORVASC) 5 MG tablet Take 5 mg by mouth daily.    [provider]  aspirin 81 MG tablet Take 81 mg by mouth daily.    [provider]  atorvastatin (LIPITOR) 80 MG tablet Take 1 tablet (80 mg total) by mouth at bedtime. 10/25/16   Rai, Delene Ruffini, MD  canagliflozin (INVOKANA) 300 MG TABS tablet Take 300 mg by mouth. 12/08/15   [provider]  Cholecalciferol (VITAMIN D3) 5000 UNITS  CAPS Take 1,000 Units by mouth.     [provider]  clopidogrel (PLAVIX) 75 MG tablet Take 1 tablet (75 mg total) by mouth daily. 10/26/16   Rai, Delene Ruffini, MD  furosemide (LASIX) 20 MG tablet Take 20 mg as needed by mouth.  09/08/16   [provider]  glyBURIDE (DIABETA) 5 MG tablet Take 5 mg by mouth 2 (two) times daily with a meal.    [provider]  Insulin Glargine (TOUJEO SOLOSTAR Vancleave) Inject 40 Units into the skin.    [provider]  lisinopril (PRINIVIL,ZESTRIL) 20 MG tablet Take 20 mg by mouth daily. 09/09/16   [provider]  meclizine (ANTIVERT) 12.5 MG tablet Take 12.5 mg by mouth 3 (three) times daily as needed for dizziness. 10/21/16   [provider]  metFORMIN (GLUCOPHAGE) 1000 MG tablet Take 1,000 mg by mouth 2 (two) times daily with a meal.    [provider]  Omega-3 Fatty Acids (FISH OIL) 1000 MG CAPS Take daily by mouth.    [provider]  vitamin B-12 (CYANOCOBALAMIN) 1000 MCG tablet Take by mouth.    [provider]    Family History Family History  Problem Relation Age of Onset  . Stroke Other   . Heart attack Other   . Cancer Other   . Diabetes Other   .  Stroke Maternal Aunt   . Stroke Paternal Aunt     Social History Social History   Tobacco Use  . Smoking status: Never Smoker  . Smokeless tobacco: Never Used  Substance Use Topics  . Alcohol use: No  . Drug use: No     Allergies   Influenza vaccines; Sitagliptin; Guaifenesin; Latex; Other; Phenylephrine-pheniramine-dm; Ultram [tramadol hcl]; and Escitalopram oxalate   Review of Systems Review of Systems  Musculoskeletal: Positive for neck pain.  Allergic/Immunologic: Positive for immunocompromised state.       Diabetic  Neurological: Positive for numbness and headaches.  All other systems reviewed and are negative.    Physical Exam Updated Vital Signs BP (!) 178/76 (BP Location: Right Arm)   Pulse (!) 114    Temp 98.7 F (37.1 C) (Oral)   Resp 18   Ht 5\' 3"  (1.6 m)   Wt 73.5 kg (162 lb)   SpO2 100%   BMI 28.70 kg/m   Physical Exam  Constitutional: She is oriented to person, place, and time. She appears well-developed and well-nourished.  HENT:  Head: Normocephalic and atraumatic.  Eyes: Conjunctivae are normal. Pupils are equal, round, and reactive to light.  Neck: Neck supple. No tracheal deviation present. No thyromegaly present.  Cardiovascular: Normal rate and regular rhythm.  No murmur heard. Pulmonary/Chest: Effort normal and breath sounds normal.  Abdominal: Soft. Bowel sounds are normal. She exhibits no distension. There is no tenderness.  Musculoskeletal: Normal range of motion. She exhibits no edema or tenderness.  Strength 5/5 overall.  Cranial nerves II through XII grossly intact.  DTRs symmetric bilaterally at knee jerk ankle jerk and biceps toes downward going bilaterally  Neurological: She is alert and oriented to person, place, and time. Coordination normal.  Skin: Skin is warm and dry. No rash noted.  Psychiatric: She has a normal mood and affect.  Nursing note and vitals reviewed.    ED Treatments / Results  Labs (all labs ordered are listed, but only abnormal results are displayed) Labs Reviewed  CBG MONITORING, ED - Abnormal; Notable for the following components:      Result Value   Glucose-Capillary 249 (*)    All other components within normal limits  BASIC METABOLIC PANEL  CBC WITH DIFFERENTIAL/PLATELET    EKG  EKG Interpretation  Date/Time:  Thursday March 23 2017 09:06:11 EST Ventricular Rate:  87 PR Interval:    QRS Duration: 96 QT Interval:  391 QTC Calculation: 471 R Axis:   13 Text Interpretation:  Sinus rhythm No significant change since last tracing Confirmed by Doug Sou [phone]) on 03/23/2017 9:12:20 AM      Results for orders placed or performed during the hospital encounter of 03/23/17  Basic metabolic panel  Result Value  Ref Range   Sodium 137 135 - 145 mmol/L   Potassium 4.0 3.5 - 5.1 mmol/L   Chloride 106 101 - 111 mmol/L   CO2 19 (L) 22 - 32 mmol/L   Glucose, Bld 270 (H) 65 - 99 mg/dL   BUN 19 6 - 20 mg/dL   Creatinine, Ser 6.04 0.44 - 1.00 mg/dL   Calcium 9.4 8.9 - 54.0 mg/dL   GFR calc non Af Amer >60 >60 mL/min   GFR calc Af Amer >60 >60 mL/min   Anion gap 12 5 - 15  CBC with Differential/Platelet  Result Value Ref Range   WBC 6.9 4.0 - 10.5 K/uL   RBC 4.52 3.87 - 5.11 MIL/uL   Hemoglobin 13.1 12.0 -  15.0 g/dL   HCT 60.4 54.0 - 98.1 %   MCV 87.8 78.0 - 100.0 fL   MCH 29.0 26.0 - 34.0 pg   MCHC 33.0 30.0 - 36.0 g/dL   RDW 19.1 47.8 - 29.5 %   Platelets 199 150 - 400 K/uL   Neutrophils Relative % 45 %   Neutro Abs 3.1 1.7 - 7.7 K/uL   Lymphocytes Relative 46 %   Lymphs Abs 3.2 0.7 - 4.0 K/uL   Monocytes Relative 6 %   Monocytes Absolute 0.4 0.1 - 1.0 K/uL   Eosinophils Relative 3 %   Eosinophils Absolute 0.2 0.0 - 0.7 K/uL   Basophils Relative 0 %   Basophils Absolute 0.0 0.0 - 0.1 K/uL  CBG monitoring, ED  Result Value Ref Range   Glucose-Capillary 249 (H) 65 - 99 mg/dL   Ct Angio Head W Or Wo Contrast  Result Date: 03/23/2017 CLINICAL DATA:  77 year old female code stroke. Head pain and left facial numbness. EXAM: CT ANGIOGRAPHY HEAD AND NECK TECHNIQUE: Multidetector CT imaging of the head and neck was performed using the standard protocol during bolus administration of intravenous contrast. Multiplanar CT image reconstructions and MIPs were obtained to evaluate the vascular anatomy. Carotid stenosis measurements (when applicable) are obtained utilizing NASCET criteria, using the distal internal carotid diameter as the denominator. CONTRAST:  80mL ISOVUE-370 IOPAMIDOL (ISOVUE-370) INJECTION 76% COMPARISON:  Head CT without contrast 0843 hours today. CTA head and neck and CT perfusion 10/24/2016. FINDINGS: CTA NECK Skeleton: Absent dentition. Stable cervical spine degeneration. No acute  osseous abnormality identified. Upper chest: Negative visible lung apices. No superior mediastinal lymphadenopathy. Other neck: Partially retropharyngeal course of the carotids, more so the right. Otherwise negative. No neck mass or lymphadenopathy. Aortic arch: Stable arch with minimal atherosclerosis. Three vessel arch configuration. Stable great vessel origins without significant stenosis. Right carotid system: Negative right CCA aside from a partially retropharyngeal course. Calcified plaque of the proximal right ICA is stable without stenosis. Tortuous cervical right ICA with a kinked appearance, stable to the skull base. Left carotid system: Minimal calcified plaque at the left CCA origin without stenosis. Mildly tortuous left CCA. Mild soft and calcified plaque at the left carotid bifurcation is stable without stenosis. Tortuous cervical left ICA with a kinked appearance redemonstrated and stable to the skull base. Vertebral arteries: No proximal right subclavian artery stenosis. The right vertebral artery origin is stable and normal. Normal right vertebral artery to the skull base. Mild proximal left subclavian atherosclerosis without stenosis is stable. Left vertebral artery origin is normal. The vertebral arteries are fairly codominant. The left vertebral artery is mildly tortuous but otherwise normal to the skull base. CTA HEAD Posterior circulation: Mild bilateral V4 segment calcified plaque without stenosis is stable. Both PICA origins remain patent. Patent vertebrobasilar junction without stenosis. Patent basilar artery without stenosis. Bilateral SCA and PCA origins remain normal. Posterior communicating arteries are diminutive or absent. Mild right PCA irregularity and stenosis in the distal P1 or P2 segment is stable (series 11, image 46). Distal PCA branches appear stable and patent. Anterior circulation: Both ICA siphons remain patent. Moderate calcified plaque on the right is redemonstrated with  no significant stenosis. Moderate to severe calcified plaque of the left ICA siphon is redemonstrated with severe supraclinoid segment stenosis (series 10, image 106). Both ophthalmic artery origins are normal. Patent carotid termini. Normal ACA origins. Anterior communicating artery is normal. Moderate to severe right ACA A2 segment irregularity and stenosis is stable (series 12,  image 13). Patent left MCA M1 segment with chronic severe proximal M1 stenosis which appears stable (series 12, image 20). The left MCA bifurcation and left MCA branches appear stable with mild to moderate irregularity but no high-grade stenosis or left MCA branch occlusion identified. The right MCA M1 and right MCA bifurcation are patent without stenosis. Moderate chronic stenosis of the dominant right MCA M2 posterior branch is redemonstrated on series 11, image 46 and stable. Right MCA branches are stable with mild irregularity otherwise. Venous sinuses: Patent. Anatomic variants: None. Delayed phase: No abnormal enhancement identified. Stable gray-white matter differentiation since 0843 hours today, including chronic left parietal and occipital lobe encephalomalacia. Review of the MIP images confirms the above findings IMPRESSION: 1. Negative for emergent large vessel occlusion. 2. Stable CTA Head and neck compared to 10/24/2016. There is mild extracranial but advanced intracranial atherosclerosis with the following significant stenoses: - moderate to severe Left ICA supraclinoid segment stenosis. - severe Left MCA M1 stenosis. - moderate to severe Right ACA A2 stenosis. - moderate Right MCA posterior right M2 branch stenosis. - mild to moderate Right PCA P2 stenosis. 3. Stable CT appearance of the brain since 0843 hours today. Electronically Signed   By: Odessa Fleming M.D.   On: 03/23/2017 10:07   Ct Angio Neck W And/or Wo Contrast  Result Date: 03/23/2017 CLINICAL DATA:  77 year old female code stroke. Head pain and left facial  numbness. EXAM: CT ANGIOGRAPHY HEAD AND NECK TECHNIQUE: Multidetector CT imaging of the head and neck was performed using the standard protocol during bolus administration of intravenous contrast. Multiplanar CT image reconstructions and MIPs were obtained to evaluate the vascular anatomy. Carotid stenosis measurements (when applicable) are obtained utilizing NASCET criteria, using the distal internal carotid diameter as the denominator. CONTRAST:  80mL ISOVUE-370 IOPAMIDOL (ISOVUE-370) INJECTION 76% COMPARISON:  Head CT without contrast 0843 hours today. CTA head and neck and CT perfusion 10/24/2016. FINDINGS: CTA NECK Skeleton: Absent dentition. Stable cervical spine degeneration. No acute osseous abnormality identified. Upper chest: Negative visible lung apices. No superior mediastinal lymphadenopathy. Other neck: Partially retropharyngeal course of the carotids, more so the right. Otherwise negative. No neck mass or lymphadenopathy. Aortic arch: Stable arch with minimal atherosclerosis. Three vessel arch configuration. Stable great vessel origins without significant stenosis. Right carotid system: Negative right CCA aside from a partially retropharyngeal course. Calcified plaque of the proximal right ICA is stable without stenosis. Tortuous cervical right ICA with a kinked appearance, stable to the skull base. Left carotid system: Minimal calcified plaque at the left CCA origin without stenosis. Mildly tortuous left CCA. Mild soft and calcified plaque at the left carotid bifurcation is stable without stenosis. Tortuous cervical left ICA with a kinked appearance redemonstrated and stable to the skull base. Vertebral arteries: No proximal right subclavian artery stenosis. The right vertebral artery origin is stable and normal. Normal right vertebral artery to the skull base. Mild proximal left subclavian atherosclerosis without stenosis is stable. Left vertebral artery origin is normal. The vertebral arteries are  fairly codominant. The left vertebral artery is mildly tortuous but otherwise normal to the skull base. CTA HEAD Posterior circulation: Mild bilateral V4 segment calcified plaque without stenosis is stable. Both PICA origins remain patent. Patent vertebrobasilar junction without stenosis. Patent basilar artery without stenosis. Bilateral SCA and PCA origins remain normal. Posterior communicating arteries are diminutive or absent. Mild right PCA irregularity and stenosis in the distal P1 or P2 segment is stable (series 11, image 46). Distal PCA branches appear  stable and patent. Anterior circulation: Both ICA siphons remain patent. Moderate calcified plaque on the right is redemonstrated with no significant stenosis. Moderate to severe calcified plaque of the left ICA siphon is redemonstrated with severe supraclinoid segment stenosis (series 10, image 106). Both ophthalmic artery origins are normal. Patent carotid termini. Normal ACA origins. Anterior communicating artery is normal. Moderate to severe right ACA A2 segment irregularity and stenosis is stable (series 12, image 13). Patent left MCA M1 segment with chronic severe proximal M1 stenosis which appears stable (series 12, image 20). The left MCA bifurcation and left MCA branches appear stable with mild to moderate irregularity but no high-grade stenosis or left MCA branch occlusion identified. The right MCA M1 and right MCA bifurcation are patent without stenosis. Moderate chronic stenosis of the dominant right MCA M2 posterior branch is redemonstrated on series 11, image 46 and stable. Right MCA branches are stable with mild irregularity otherwise. Venous sinuses: Patent. Anatomic variants: None. Delayed phase: No abnormal enhancement identified. Stable gray-white matter differentiation since 0843 hours today, including chronic left parietal and occipital lobe encephalomalacia. Review of the MIP images confirms the above findings IMPRESSION: 1. Negative for  emergent large vessel occlusion. 2. Stable CTA Head and neck compared to 10/24/2016. There is mild extracranial but advanced intracranial atherosclerosis with the following significant stenoses: - moderate to severe Left ICA supraclinoid segment stenosis. - severe Left MCA M1 stenosis. - moderate to severe Right ACA A2 stenosis. - moderate Right MCA posterior right M2 branch stenosis. - mild to moderate Right PCA P2 stenosis. 3. Stable CT appearance of the brain since 0843 hours today. Electronically Signed   By: Odessa FlemingH  Hall M.D.   On: 03/23/2017 10:07   Ct Head Code Stroke Wo Contrast`  Result Date: 03/23/2017 CLINICAL DATA:  Code stroke. Sharp pain in back of head beginning 1 hour ago. Left facial numbness. EXAM: CT HEAD WITHOUT CONTRAST TECHNIQUE: Contiguous axial images were obtained from the base of the skull through the vertex without intravenous contrast. COMPARISON:  10/28/2016 FINDINGS: Brain: No evidence of acute infarction, hemorrhage, hydrocephalus, extra-axial collection or mass lesion/mass effect. There is a sizable remote left temporoparietal and occipital infarct with expected evolution since prior. Chronic small vessel ischemic change in the cerebral white matter without detected progression. Vascular: Atherosclerotic calcification.  No hyperdense vessel. Skull: Negative Sinuses/Orbits: Bilateral cataract resection.  No acute finding. Other: These results were called by telephone at the time of interpretation on 03/23/2017 at 8:47 am to Dr. Doug SouSAM Jerrie Schussler , who verbally acknowledged these results. ASPECTS [facility]([place] Stroke Program Early CT Score) - Ganglionic level infarction (caudate, lentiform nuclei, internal capsule, insula, M1-M3 cortex): 7 - Supraganglionic infarction (M4-M6 cortex): 3 Total score (0-10 with 10 being normal): 10 IMPRESSION: 1. No acute finding. 2. Remote left posterior cerebral infarct. Electronically Signed   By: Marnee SpringJonathon  Watts M.D.   On: 03/23/2017 08:48    Radiology Ct  Head Code Stroke Wo Contrast`  Result Date: 03/23/2017 CLINICAL DATA:  Code stroke. Sharp pain in back of head beginning 1 hour ago. Left facial numbness. EXAM: CT HEAD WITHOUT CONTRAST TECHNIQUE: Contiguous axial images were obtained from the base of the skull through the vertex without intravenous contrast. COMPARISON:  10/28/2016 FINDINGS: Brain: No evidence of acute infarction, hemorrhage, hydrocephalus, extra-axial collection or mass lesion/mass effect. There is a sizable remote left temporoparietal and occipital infarct with expected evolution since prior. Chronic small vessel ischemic change in the cerebral white matter without detected progression. Vascular: Atherosclerotic calcification.  No hyperdense vessel. Skull: Negative Sinuses/Orbits: Bilateral cataract resection.  No acute finding. Other: These results were called by telephone at the time of interpretation on 03/23/2017 at 8:47 am to Dr. Doug Sou , who verbally acknowledged these results. ASPECTS [facility] Stroke Program Early CT Score) - Ganglionic level infarction (caudate, lentiform nuclei, internal capsule, insula, M1-M3 cortex): 7 - Supraganglionic infarction (M4-M6 cortex): 3 Total score (0-10 with 10 being normal): 10 IMPRESSION: 1. No acute finding. 2. Remote left posterior cerebral infarct. Electronically Signed   By: Marnee Spring M.D.   On: 03/23/2017 08:48    Procedures Procedures (including critical care time)  Medications Ordered in ED Medications - No data to display   Initial Impression / Assessment and Plan / ED Course  I have reviewed the triage vital signs and the nursing notes.  Pertinent labs & imaging results that were available during my care of the patient were reviewed by me and considered in my medical decision making (see chart for details).     Patient evaluated by tele-neurology suggest CT angios head and neck, ordered here.  And 23-hour hospitalization for stroke workup Patient passed swallow  screen.  11 AM symptoms remain unchanged.  Continues to complain of numbness in her left cheek otherwise asymptomatic.  She is handling her secretions well.  No facial asymmetry.  I consulted Dr . Laurence Slate , Who will see pt in hospital and and Dr.Dr Ophelia Charter who will make arrangements for 23-hour observation.  Lab work consistent with hyperglycemia Final Clinical Impressions(s) / ED Diagnoses  Diagnoses #1 paresthesias #2 bad headache Final diagnoses:  None   #3 hyperglycemia ED Discharge Orders    None       Doug Sou, MD 03/23/17 1134

## 2017-03-23 NOTE — Consult Note (Signed)
   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 No new nondisabling symptoms. Admit for stroke workup. The left posterior headache that radiates forward sounds like a vertebral artery dissection. Recommend doing CTA head/neck.  - - -   Not a tpa candidate due to: NIHSS 2 with no new nondisabling symptoms.  Presentation is not suggestive of Large Vessel Occlusive Disease. Thrombectomy would not be recommended.   Differential Diagnosis:   1. Cardioembolic stroke  2. Small vessel disease/lacune  3. Thromboembolic, artery-to-artery mechanism  4. Hypercoagulable state-related infarct  5. Transient ischemic attack  6. Thrombotic mechanism, large artery disease   Comments:   Door time:  0821 [facility] contacted: [phone] [facility] at bedside: 0851 NIHSS assessment time: 0901  Recommendations:  CTA head/neck Tele DVT proph - lovenox permissive htn PT/OT/speech bedside swallow eval  Inpatient neurology consultation Inpatient stroke evaluation as per Neurology/ Internal Medicine Discussed with ED MD Please call with questions  -----------------------------------------------------------------------------------------  CC L facial numbness  History of Present Illness   Patient is a 77 yo woman with acute onset L posterior headache along with left facial numbness starting at approx 0730. 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 The numbness persists. No LOC/convulsion.   From the last stroke, she has mild right hand weakness and VF cut on the right with mild difficulty with word finding.   Diagnostic: CT head unremarkable.   Exam:   RESULT SUMMARY: 1 points NIH Stroke Scale   INPUTS: 1A: Level of consciousness -> 0 = Alert; keenly responsive 1B: Ask month  and age -> 0 = Both questions right 1C: 'Blink eyes' & 'squeeze hands' -> 0 = Performs both tasks 2: Horizontal extraocular movements -> 0 = Normal 3: Visual fields -> 0 = No visual loss 4: Facial palsy -> 0 = Normal symmetry 5A: Left arm motor drift -> 0 = No drift for 10 seconds 5B: Right arm motor drift -> 0 = No drift for 10 seconds 6A: Left leg motor drift -> 0 = No drift for 5 seconds 6B: Right leg motor drift -> 0 = No drift for 5 seconds 7: Limb Ataxia -> 0 = No ataxia 8: Sensation -> 1 = Mild-moderate loss: less sharp/more dull  9: Language/aphasia -> 0 = Normal; no aphasia 10: Dysarthria -> 0 = Normal 11: Extinction/inattention -> 0 = No abnormality    Medical Decision Making:  - Extensive number of diagnosis or management options are considered above.   - Extensive amount of complex data reviewed.   - High risk of complication and/or morbidity or mortality are associated with differential diagnostic considerations above.  - There may be Uncertain outcome and increased probability of prolonged functional impairment or high probability of severe prolonged functional impairment associated with some of these differential diagnosis.  Medical Data Reviewed:  1.Data reviewed include clinical labs, radiology,  Medical Tests;   2.Tests results discussed w/performing or interpreting physician;   3.Obtaining/reviewing old medical records;  4.Obtaining case history from another source;  5.Independent review of image, tracing or specimen.    Patient was informed the Neurology Consult would happen via telehealth (remote video) and consented to receiving care in this manner.

## 2017-03-24 ENCOUNTER — Observation Stay (HOSPITAL_BASED_OUTPATIENT_CLINIC_OR_DEPARTMENT_OTHER): Payer: Medicare Other

## 2017-03-24 DIAGNOSIS — G43109 Migraine with aura, not intractable, without status migrainosus: Secondary | ICD-10-CM

## 2017-03-24 DIAGNOSIS — I5030 Unspecified diastolic (congestive) heart failure: Secondary | ICD-10-CM

## 2017-03-24 DIAGNOSIS — E118 Type 2 diabetes mellitus with unspecified complications: Secondary | ICD-10-CM | POA: Diagnosis not present

## 2017-03-24 DIAGNOSIS — I10 Essential (primary) hypertension: Secondary | ICD-10-CM | POA: Diagnosis not present

## 2017-03-24 DIAGNOSIS — R202 Paresthesia of skin: Secondary | ICD-10-CM | POA: Diagnosis not present

## 2017-03-24 LAB — COMPREHENSIVE METABOLIC PANEL
ALT: 15 U/L (ref 14–54)
ANION GAP: 9 (ref 5–15)
AST: 17 U/L (ref 15–41)
Albumin: 3.1 g/dL — ABNORMAL LOW (ref 3.5–5.0)
Alkaline Phosphatase: 56 U/L (ref 38–126)
BUN: 15 mg/dL (ref 6–20)
CALCIUM: 9 mg/dL (ref 8.9–10.3)
CHLORIDE: 110 mmol/L (ref 101–111)
CO2: 20 mmol/L — AB (ref 22–32)
CREATININE: 0.63 mg/dL (ref 0.44–1.00)
GFR calc Af Amer: >60 mL/min (ref >60)
Glucose, Bld: 138 mg/dL — ABNORMAL HIGH (ref 65–99)
Potassium: 4 mmol/L (ref 3.5–5.1)
Sodium: 139 mmol/L (ref 135–145)
Total Bilirubin: 0.7 mg/dL (ref 0.3–1.2)
Total Protein: 6.2 g/dL — ABNORMAL LOW (ref 6.5–8.1)

## 2017-03-24 LAB — HEMOGLOBIN A1C
HEMOGLOBIN A1C: 7.2 % — AB (ref 4.8–5.6)
MEAN PLASMA GLUCOSE: 159.94 mg/dL

## 2017-03-24 LAB — GLUCOSE, CAPILLARY
Glucose-Capillary: 132 mg/dL — ABNORMAL HIGH (ref 65–99)
Glucose-Capillary: 143 mg/dL — ABNORMAL HIGH (ref 65–99)

## 2017-03-24 LAB — MAGNESIUM: MAGNESIUM: 1.5 mg/dL — AB (ref 1.7–2.4)

## 2017-03-24 LAB — CBC WITH DIFFERENTIAL/PLATELET
BASOS ABS: 0 10*3/uL (ref 0.0–0.1)
BASOS PCT: 1 %
Eosinophils Absolute: 0.2 10*3/uL (ref 0.0–0.7)
Eosinophils Relative: 4 %
HEMATOCRIT: 36.7 % (ref 36.0–46.0)
HEMOGLOBIN: 11.7 g/dL — AB (ref 12.0–15.0)
Lymphocytes Relative: 51 %
Lymphs Abs: 2.6 10*3/uL (ref 0.7–4.0)
MCH: 28.4 pg (ref 26.0–34.0)
MCHC: 31.9 g/dL (ref 30.0–36.0)
MCV: 89.1 fL (ref 78.0–100.0)
Monocytes Absolute: 0.3 10*3/uL (ref 0.1–1.0)
Monocytes Relative: 6 %
NEUTROS ABS: 1.9 10*3/uL (ref 1.7–7.7)
NEUTROS PCT: 38 %
Platelets: 177 10*3/uL (ref 150–400)
RBC: 4.12 MIL/uL (ref 3.87–5.11)
RDW: 14 % (ref 11.5–15.5)
WBC: 5 10*3/uL (ref 4.0–10.5)

## 2017-03-24 LAB — LIPID PANEL
CHOL/HDL RATIO: 3.5 ratio
CHOLESTEROL: 116 mg/dL (ref 0–200)
HDL: 33 mg/dL — ABNORMAL LOW (ref >40)
LDL Cholesterol: 51 mg/dL (ref 0–99)
TRIGLYCERIDES: 160 mg/dL — AB (ref <150)
VLDL: 32 mg/dL (ref 0–40)

## 2017-03-24 LAB — ECHOCARDIOGRAM COMPLETE
HEIGHTINCHES: 63 in
Weight: 2592 oz

## 2017-03-24 MED ORDER — ASPIRIN 81 MG PO TABS: 81.0000 mg | ORAL_TABLET | Freq: Every day | ORAL | 0 refills | Status: DC

## 2017-03-24 NOTE — Care Management Obs Status (Signed)
MEDICARE OBSERVATION STATUS NOTIFICATION   Patient Details  Name: Nilsa NuttingCarol Bourgoin MRN: [id number] Date of Birth: 21-Feb-1940   Medicare Observation Status Notification Given:  Yes    Kermit BaloKelli F Ravon Mortellaro, RN 03/24/2017, 12:21 PM

## 2017-03-24 NOTE — Care Management Note (Signed)
Case Management Note  Patient Details  Name: Nilsa NuttingCarol Hansen MRN: [id number] Date of Birth: [birth year]  Subjective/Objective:      Pt in with neurological symptoms. She is from home with spouse.              Action/Plan: No f/u per OT. Pt has intermittent supervision at home. Pt has PCP, insurance and transportation home.  CM signing off.   Expected Discharge Date:  03/24/17               Expected Discharge Plan:  Home/Self Care  In-House Referral:     Discharge planning Services     Post Acute Care Choice:    Choice offered to:     DME Arranged:    DME Agency:     HH Arranged:    HH Agency:     Status of Service:  Completed, signed off  If discussed at [employer] Length of Stay Meetings, dates discussed:    Additional Comments:  Kermit BaloKelli F Leonela Kivi, RN 03/24/2017, 12:22 PM

## 2017-03-24 NOTE — Progress Notes (Signed)
PT Cancellation Note  Patient Details Name: Jill NuttingCarol Howard MRN: [id number] DOB: [birth year]   Cancelled Treatment:    Reason Eval/Treat Not Completed: Patient at procedure or test/unavailable Attempted to evaluate patient at 0850, patient out of room, family reports she is at echo. Will re-atempt later today.    Etta GrandchildSean Clennon Nasca, PT, DPT Acute Rehab Services Pager: [phone]     Etta GrandchildSean  [name] 03/24/2017, 8:50 AM

## 2017-03-24 NOTE — Progress Notes (Signed)
  Echocardiogram 2D Echocardiogram has been performed.  Jill Howard L Androw 03/24/2017, 9:14 AM

## 2017-03-24 NOTE — Progress Notes (Addendum)
NEUROHOSPITALISTS STROKE TEAM - DAILY PROGRESS NOTE   ADMISSION HISTORY: Jill Howard is an 77 y.o. 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 She went to the hospital this morning after experiencing acute onset H/A and neck pain. Patient states she woke up at approximately 06:30, ate breakfast and then suddenly had a "really bad, knife-like, headache", pain/numbness on the left side of her face/neck and a brief period of Right arm weakness.   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 No  tPA was given due to low NIHSS and nondisabling symptoms. Patient was transferred to [facility] for further Neurology consultation and stroke workup. Initial Head CT was negative for acute stroke. CTA Head/Neck was negative for dissection  Currently, she is complaining of mild Left facial numbness, intermittent 4/10, left sided, occipital, H/A which she says seems to get worse if her B/P elevates. She also has some very mild left sided neck pain but this is improving. She says her baseline Right peripheral vision has worsened since yesterday. She denies any difficulty speaking, difficulty with coordination, denies any new weakness or numbness. She denies any recent illness or travel. States she has slept very poorly over the past 3 days. She also admits that she occasional feels like her heart is racing when she is laying in bed.  Patient states she last followed up with Neurology/Dr Pearlean Brownie in mid January and was taken off her baby ASA at that time. She continues to take Plavix daily and took her dose this morning. She tells me she is a research patient but did not get chosen for the Loop recorder  placement.  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  Baseline Findings: mild right hand weakness and VF cut on the right/Left with mild difficulty with word finding.   Date last known well: Date: 03/23/2017 Time last known well: Time: 07:30 tPA Given: No:  Modified Rankin: Rankin Score=0 NIHSS: 2  SUBJECTIVE (INTERVAL HISTORY) Family is at the bedside. Patient is found laying in bed in NAD. Overall she feels her condition is rapidly improving. Voices no new complaints. No new events reported overnight. Patient denies H/A today, continues to have some mild left facial numbness and left sided neck discomfort. States her Right peripheral vision has decreased back to level in 10/2016. Imaging and POC reviewed, Patient ready to be discharged home                          Physical Examination: Vitals:   03/24/17 0220 03/24/17 0400 03/24/17 0600 03/24/17 0727  BP: (!) 140/54 126/70 (!) 94/52 (!) 126/56  Pulse: 68 79 74 73  Resp: 16 16 18 18   Temp:  97.7 F (36.5 C) 98.5 F (36.9 C) 98.5 F (36.9 C)  TempSrc:  Oral Oral Oral  SpO2: 96% 98% 99%  96%  Weight:      Height:       HEENT-  Normocephalic, Normal external eye/conjunctiva.  Normal external ears. Normal external nose, mucus membranes and septum.   Cardiovascular - Regular rate and rhythm  Respiratory - Lungs clear bilaterally. Non-labored breathing, No wheezing. Abdomen - soft and non-tender, BS normal Extremities- no edema or cyanosis Skin-warm and dry  Neurological Examination Mental Status: Alert, oriented, thought content appropriate.  Speech fluent without evidence of aphasia or neglect.  Able to follow 3 step commands without difficulty. Cranial Nerves: II: Visual Fields- Bilateral visual field cut - baseline finding. Right peripheral vision worsened per patient. Pupils are equal, round, and reactive to light.   III,IV, VI: EOMI without ptosis or diploplia. Rotational and horizontal  nystagmus.   V: Facial sensation is decreased on left, improving  VII: Facial movement is symmetric.  VIII: hearing is intact to voice X: Uvula elevates symmetrically XI: Shoulder shrug is symmetric. XII: tongue is midline without atrophy or fasciculations.  Motor: Tone is normal. Bulk is normal. 5/5 strength was present in all four extremities.  Sensor: Sensation is symmetric to light touch and temperature in the arms and legs. Deep Tendon Reflexes: 3+LE and 2+ UE's Plantars: Toes are downgoing bilaterally.  Cerebellar: normal finger-to-nose, normal rapid alternating movements and normal heel-to-shin test Gait: normal gait and station   Lab Results: CBC: Recent Labs  Lab 03/23/17 0850 03/23/17 2039 03/24/17 0643  WBC 6.9 6.6 5.0  HGB 13.1 12.0 11.7*  HCT 39.7 36.9 36.7  MCV 87.8 88.1 89.1  PLT 199 202 177   Basic Metabolic Panel: Recent Labs  Lab 03/23/17 0850 03/23/17 2039 03/24/17 0643  NA 137  --  139  K 4.0  --  4.0  CL 106  --  110  CO2 19*  --  20*  GLUCOSE 270*  --  138*  BUN 19  --  15  CREATININE 0.69 0.88 0.63  CALCIUM 9.4  --  9.0  MG  --   --  1.5*   Liver Function Tests: Recent Labs  Lab 03/24/17 0643  AST 17  ALT 15  ALKPHOS 56  BILITOT 0.7  PROT 6.2*  ALBUMIN 3.1*   Urine Drug Screen:      Component Value Date/Time   LABOPIA NONE DETECTED 10/28/2016 0736   COCAINSCRNUR NONE DETECTED 10/28/2016 0736   LABBENZ NONE DETECTED 10/28/2016 0736   AMPHETMU NONE DETECTED 10/28/2016 0736   THCU NONE DETECTED 10/28/2016 0736   LABBARB NONE DETECTED 10/28/2016 0736    Alcohol Level: No results for input(s): ETH in the last 168 hours.  Imaging: Ct Angio Head W Or Wo Contrast  Result Date: 03/23/2017 CLINICAL DATA:  77 year old female code stroke. Head pain and left facial numbness. EXAM: CT ANGIOGRAPHY HEAD AND NECK TECHNIQUE: Multidetector CT imaging of the head and neck was performed using the standard protocol during bolus administration of  intravenous contrast. Multiplanar CT image reconstructions and MIPs were obtained to evaluate the vascular anatomy. Carotid stenosis measurements (when applicable) are obtained utilizing NASCET criteria, using the distal internal carotid diameter as the denominator. CONTRAST:  80mL ISOVUE-370 IOPAMIDOL (ISOVUE-370) INJECTION 76% COMPARISON:  Head CT without contrast 0843 hours today. CTA head and neck and CT perfusion 10/24/2016. FINDINGS: CTA NECK Skeleton: Absent dentition. Stable cervical spine degeneration. No acute osseous abnormality identified. Upper chest: Negative visible lung apices. No superior mediastinal lymphadenopathy. Other neck: Partially retropharyngeal course of the carotids, more so the right. Otherwise negative.  No neck mass or lymphadenopathy. Aortic arch: Stable arch with minimal atherosclerosis. Three vessel arch configuration. Stable great vessel origins without significant stenosis. Right carotid system: Negative right CCA aside from a partially retropharyngeal course. Calcified plaque of the proximal right ICA is stable without stenosis. Tortuous cervical right ICA with a kinked appearance, stable to the skull base. Left carotid system: Minimal calcified plaque at the left CCA origin without stenosis. Mildly tortuous left CCA. Mild soft and calcified plaque at the left carotid bifurcation is stable without stenosis. Tortuous cervical left ICA with a kinked appearance redemonstrated and stable to the skull base. Vertebral arteries: No proximal right subclavian artery stenosis. The right vertebral artery origin is stable and normal. Normal right vertebral artery to the skull base. Mild proximal left subclavian atherosclerosis without stenosis is stable. Left vertebral artery origin is normal. The vertebral arteries are fairly codominant. The left vertebral artery is mildly tortuous but otherwise normal to the skull base. CTA HEAD Posterior circulation: Mild bilateral V4 segment calcified  plaque without stenosis is stable. Both PICA origins remain patent. Patent vertebrobasilar junction without stenosis. Patent basilar artery without stenosis. Bilateral SCA and PCA origins remain normal. Posterior communicating arteries are diminutive or absent. Mild right PCA irregularity and stenosis in the distal P1 or P2 segment is stable (series 11, image 46). Distal PCA branches appear stable and patent. Anterior circulation: Both ICA siphons remain patent. Moderate calcified plaque on the right is redemonstrated with no significant stenosis. Moderate to severe calcified plaque of the left ICA siphon is redemonstrated with severe supraclinoid segment stenosis (series 10, image 106). Both ophthalmic artery origins are normal. Patent carotid termini. Normal ACA origins. Anterior communicating artery is normal. Moderate to severe right ACA A2 segment irregularity and stenosis is stable (series 12, image 13). Patent left MCA M1 segment with chronic severe proximal M1 stenosis which appears stable (series 12, image 20). The left MCA bifurcation and left MCA branches appear stable with mild to moderate irregularity but no high-grade stenosis or left MCA branch occlusion identified. The right MCA M1 and right MCA bifurcation are patent without stenosis. Moderate chronic stenosis of the dominant right MCA M2 posterior branch is redemonstrated on series 11, image 46 and stable. Right MCA branches are stable with mild irregularity otherwise. Venous sinuses: Patent. Anatomic variants: None. Delayed phase: No abnormal enhancement identified. Stable gray-white matter differentiation since 0843 hours today, including chronic left parietal and occipital lobe encephalomalacia. Review of the MIP images confirms the above findings IMPRESSION: 1. Negative for emergent large vessel occlusion. 2. Stable CTA Head and neck compared to 10/24/2016. There is mild extracranial but advanced intracranial atherosclerosis with the following  significant stenoses: - moderate to severe Left ICA supraclinoid segment stenosis. - severe Left MCA M1 stenosis. - moderate to severe Right ACA A2 stenosis. - moderate Right MCA posterior right M2 branch stenosis. - mild to moderate Right PCA P2 stenosis. 3. Stable CT appearance of the brain since 0843 hours today. Electronically Signed   By: Odessa Fleming M.D.   On: 03/23/2017 10:07   Ct Angio Neck W And/or Wo Contrast  Result Date: 03/23/2017 CLINICAL DATA:  77 year old female code stroke. Head pain and left facial numbness. EXAM: CT ANGIOGRAPHY HEAD AND NECK TECHNIQUE: Multidetector CT imaging of the head and neck was performed using the standard protocol during bolus administration of intravenous contrast. Multiplanar CT image reconstructions and MIPs were obtained to evaluate the vascular anatomy. Carotid stenosis measurements (when applicable) are obtained utilizing NASCET  criteria, using the distal internal carotid diameter as the denominator. CONTRAST:  80mL ISOVUE-370 IOPAMIDOL (ISOVUE-370) INJECTION 76% COMPARISON:  Head CT without contrast 0843 hours today. CTA head and neck and CT perfusion 10/24/2016. FINDINGS: CTA NECK Skeleton: Absent dentition. Stable cervical spine degeneration. No acute osseous abnormality identified. Upper chest: Negative visible lung apices. No superior mediastinal lymphadenopathy. Other neck: Partially retropharyngeal course of the carotids, more so the right. Otherwise negative. No neck mass or lymphadenopathy. Aortic arch: Stable arch with minimal atherosclerosis. Three vessel arch configuration. Stable great vessel origins without significant stenosis. Right carotid system: Negative right CCA aside from a partially retropharyngeal course. Calcified plaque of the proximal right ICA is stable without stenosis. Tortuous cervical right ICA with a kinked appearance, stable to the skull base. Left carotid system: Minimal calcified plaque at the left CCA origin without stenosis.  Mildly tortuous left CCA. Mild soft and calcified plaque at the left carotid bifurcation is stable without stenosis. Tortuous cervical left ICA with a kinked appearance redemonstrated and stable to the skull base. Vertebral arteries: No proximal right subclavian artery stenosis. The right vertebral artery origin is stable and normal. Normal right vertebral artery to the skull base. Mild proximal left subclavian atherosclerosis without stenosis is stable. Left vertebral artery origin is normal. The vertebral arteries are fairly codominant. The left vertebral artery is mildly tortuous but otherwise normal to the skull base. CTA HEAD Posterior circulation: Mild bilateral V4 segment calcified plaque without stenosis is stable. Both PICA origins remain patent. Patent vertebrobasilar junction without stenosis. Patent basilar artery without stenosis. Bilateral SCA and PCA origins remain normal. Posterior communicating arteries are diminutive or absent. Mild right PCA irregularity and stenosis in the distal P1 or P2 segment is stable (series 11, image 46). Distal PCA branches appear stable and patent. Anterior circulation: Both ICA siphons remain patent. Moderate calcified plaque on the right is redemonstrated with no significant stenosis. Moderate to severe calcified plaque of the left ICA siphon is redemonstrated with severe supraclinoid segment stenosis (series 10, image 106). Both ophthalmic artery origins are normal. Patent carotid termini. Normal ACA origins. Anterior communicating artery is normal. Moderate to severe right ACA A2 segment irregularity and stenosis is stable (series 12, image 13). Patent left MCA M1 segment with chronic severe proximal M1 stenosis which appears stable (series 12, image 20). The left MCA bifurcation and left MCA branches appear stable with mild to moderate irregularity but no high-grade stenosis or left MCA branch occlusion identified. The right MCA M1 and right MCA bifurcation are  patent without stenosis. Moderate chronic stenosis of the dominant right MCA M2 posterior branch is redemonstrated on series 11, image 46 and stable. Right MCA branches are stable with mild irregularity otherwise. Venous sinuses: Patent. Anatomic variants: None. Delayed phase: No abnormal enhancement identified. Stable gray-white matter differentiation since 0843 hours today, including chronic left parietal and occipital lobe encephalomalacia. Review of the MIP images confirms the above findings IMPRESSION: 1. Negative for emergent large vessel occlusion. 2. Stable CTA Head and neck compared to 10/24/2016. There is mild extracranial but advanced intracranial atherosclerosis with the following significant stenoses: - moderate to severe Left ICA supraclinoid segment stenosis. - severe Left MCA M1 stenosis. - moderate to severe Right ACA A2 stenosis. - moderate Right MCA posterior right M2 branch stenosis. - mild to moderate Right PCA P2 stenosis. 3. Stable CT appearance of the brain since 0843 hours today. Electronically Signed   By: Odessa Fleming M.D.   On: 03/23/2017 10:07  Mr Brain Wo Contrast  Result Date: 03/23/2017 CLINICAL DATA:  Acute onset posterior sharp head pain radiates to the neck this morning. LEFT facial numbness and word-finding difficulties today. History of stroke, hypertension, diabetes. On Plavix. EXAM: MRI HEAD WITHOUT CONTRAST TECHNIQUE: Multiplanar, multiecho pulse sequences of the brain and surrounding structures were obtained without intravenous contrast. COMPARISON:  MRI of the head October 28, 2016 and CT/CTA HEAD March 23, 2017 FINDINGS: INTRACRANIAL CONTENTS: No reduced diffusion to suggest acute ischemia. Spurious reduced diffusion associated with susceptibility artifact and old LEFT temporal parietoccipital lobe infarcts. Mild ex vacuo dilatation LEFT occipital horn. Ventricles and sulci are otherwise normal for patient's age. A few scattered subcentimeter supratentorial white matter  FLAIR T2 hyperintensities compatible with mild chronic small vessel ischemic disease, less than expected for age. No midline shift, mass effect or masses. No abnormal extra-axial fluid collections. VASCULAR: Normal major intracranial vascular flow voids present at skull base. SKULL AND UPPER CERVICAL SPINE: No abnormal sellar expansion. No suspicious calvarial bone marrow signal. Craniocervical junction maintained. SINUSES/ORBITS: Trace paranasal sinus mucosal thickening. Trace LEFT greater than RIGHT mastoid effusions. The included ocular globes and orbital contents are non-suspicious. Status post bilateral ocular lens implants. OTHER: Patient is edentulous. IMPRESSION: 1. No acute intracranial process. 2. Old LEFT MCA/posterior watershed territory infarcts. Electronically Signed   By: Awilda Metro M.D.   On: 03/23/2017 20:18   Ct Head Code Stroke Wo Contrast`  Result Date: 03/23/2017 CLINICAL DATA:  Code stroke. Sharp pain in back of head beginning 1 hour ago. Left facial numbness. EXAM: CT HEAD WITHOUT CONTRAST TECHNIQUE: Contiguous axial images were obtained from the base of the skull through the vertex without intravenous contrast. COMPARISON:  10/28/2016 FINDINGS: Brain: No evidence of acute infarction, hemorrhage, hydrocephalus, extra-axial collection or mass lesion/mass effect. There is a sizable remote left temporoparietal and occipital infarct with expected evolution since prior. Chronic small vessel ischemic change in the cerebral white matter without detected progression. Vascular: Atherosclerotic calcification.  No hyperdense vessel. Skull: Negative Sinuses/Orbits: Bilateral cataract resection.  No acute finding. Other: These results were called by telephone at the time of interpretation on 03/23/2017 at 8:47 am to Dr. Doug Sou , who verbally acknowledged these results. ASPECTS [facility] Stroke Program Early CT Score) - Ganglionic level infarction (caudate, lentiform nuclei, internal  capsule, insula, M1-M3 cortex): 7 - Supraganglionic infarction (M4-M6 cortex): 3 Total score (0-10 with 10 being normal): 10 IMPRESSION: 1. No acute finding. 2. Remote left posterior cerebral infarct. Electronically Signed   By: Marnee Spring M.D.   On: 03/23/2017 08:48   ECHO 10/25/2016 Study Conclusions - Left ventricle: The cavity size was normal. Wall thickness was   normal. Systolic function was vigorous. The estimated ejection   fraction was in the range of 65% to 70%. Wall motion was normal;   there were no regional wall motion abnormalities. Doppler   parameters are consistent with abnormal left ventricular   relaxation (grade 1 diastolic dysfunction).  ECHO  03/24/2017 Completed and results pending    IMPRESSION: Jill Howard is a 77 y.o. 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 All imaging thus far negative for acute findings.  Likely TIA:  Suspected Etiology: Likely small vessel versus cardioembolic Resultant Symptoms: Left facial numbness  Stroke Risk Factors: carotid stenosis, diabetes mellitus, hyperlipidemia and hypertension Other Stroke Risk Factors: Advanced age, Hx stroke  Outstanding Stroke Work-up Studies:    Work up completed  PLAN  03/24/2017: Likely discharge home today with family Frequent Neurochecks  Telemetry Monitoring Continue Aspirin/ Plavix, will add back ASA 81 mg (not 325 mg)  due to Hx of ulcer Continue Atorvastatin 80 mg PT/OT/SLP May need 30 day cardiac monitor to rule out AFIB, outpatient Neurology to arrange Ongoing aggressive stroke risk factor management Patient will be counseled to be compliant with her antithrombotic medications Patient will be counseled on Lifestyle modifications including, Diet, Exercise,  and Stress Follow up with [facility] in 2 weeks  HX OF STROKES: 10/24/2016: Scattered areas of acute ischemia within the left MCA distribution, greatest in the posterior left parietal and temporal lobes  Baseline Findings: mild right hand weakness and VF cut on the right with mild difficulty with word finding.   INTRACRANIAL Atherosclerosis &Stenosis: moderate to severe Left ICA supraclinoid segment stenosis. - severe Left MCA M1 stenosis. - moderate to severe Right ACA A2 stenosis. - moderate Right MCA posterior right M2 branch stenosis. - mild to moderate Right PCA P2 stenosis.  DAPT resumed  R/O AFIB: May need 30 day cardiac monitor, outpatient Neurology to arrange  HYPERTENSION: Stable, Avoid Hypotension and Dehydration Permissive hypertension (OK if <220/120) for 24-48 hours post stroke and then gradually normalized within 5-7 days. Labetolol PRN Long term BP goal normotensive. May slowly restart home B/P medications after 48 hours Home Meds: Multiple  HYPERLIPIDEMIA:    Component Value Date/Time   CHOL 116 03/24/2017 0643   TRIG 160 (H) 03/24/2017 0643   HDL 33 (L) 03/24/2017 0643   CHOLHDL 3.5 03/24/2017 0643   VLDL 32 03/24/2017 0643   LDLCALC 51 03/24/2017 0643  Home Meds:  Lipitor 80 mg LDL  goal < 70 Continued on  Lipitor to 80 mg daily for now Continue statin at discharge  DIABETES: Lab Results  Component Value Date   HGBA1C 7.2 (H) 03/24/2017  HgbA1c goal < 7.0 Currently on: Novolog Continue CBG monitoring and SSI to maintain glucose 140-180 mg/dl DM education   Other Active Problems: Active Problems:   Essential hypertension   Diabetes mellitus with complication (HCC)   CVA (cerebral vascular accident) [facility](HCC)   Neurological symptoms    Hospital day # 0 VTE prophylaxis: SCD's  Diet - Fall precautions Fall precautions Diet heart healthy/carb modified Room service appropriate? Yes; Fluid consistency: Thin   FAMILY UPDATES:  family at bedside  TEAM UPDATES:Alekh, Kshitiz, MD   Prior Home Stroke Medications:  aspirin 81 mg daily and clopidogrel 75 mg daily  Discharge Stroke Meds:  Please discharge patient on ASA 81 mg daily and Plavix 75 mg daily  Disposition: 01-Home or Self Care Therapy Recs:               HOME Follow Up:  Follow-up Information    Micki RileySethi, Pramod S, MD. Schedule an appointment as soon as possible for a visit in 2 week(s).   Specialties:  Neurology, Radiology Contact information: [address] [place] [address] [phone]          Herma ArdJudd, Donna J, PA-C -PCP Follow up in 1-2 weeks      Assessment and plan discussed with with attending physician and they are in agreement.    Beryl MeagerMary A Costello, ANP-C [facility] 03/24/2017, 11:07 AM    ATTENDING ASSESSMENT:   I reviewed  above note and agree with the assessment and plan. I have made any additions or clarifications directly to the above note.  77 year old female with history of diabetes, hypertension, HLD, gastric ulcer admitted in 10/2016 for aphasia, right hand weakness.  MRI showed left posterior MCA infarcts.  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  EF 65-70%.  LDL 81 A1c 7.0.  She was put on dual antiplatelet and Lipitor on discharge.  She was also enrolled to stroke AF trial and randomized to medical treatment arm.   She follows with Dr. Pearlean Brownie in clinic and completed dual antiplatelet treatment, on aspirin only PTA.  At this time, patient admitted for headache and neck pain followed by left facial numbness, and increased right visual field deficit.  CT head showed old left MCA infarcts, no acute abnormality.  CTA head and neck essentially the same as in 10/2016 no significant change.  MRI negative for acute stroke.  LDL 51 and A1c 7.2.   Patient symptoms may be present complicated migraine with recrudescence of old stroke symptoms, however, TIA not able  to completely ruled out.  Resume dual antiplatelet at this time, and will set up close follow-up with Dr. Meryl Dare in 2 weeks.  Neurology will sign off. Please call with questions. Pt will follow up with Dr. Pearlean Brownie at [facility] in about 2 weeks. Thanks for the consult.   Marvel Plan, MD PhD Stroke Neurology 03/24/2017 11:46 PM

## 2017-03-24 NOTE — Evaluation (Signed)
Speech Language Pathology Evaluation Patient Details Name: Jill Howard MRN: [id number] DOB: 07-17-1940 Today's Date: 03/24/2017 Time: [id number] SLP Time Calculation (min) (ACUTE ONLY): 20 min  Problem List:  Patient Active Problem List   Diagnosis Date Noted  . Neurological symptoms 03/23/2017  . CVA (cerebral vascular accident) (HCC) 10/25/2016  . 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 Essential hypertension 10/24/2016  . Diabetes mellitus with complication (HCC) 10/24/2016  . Chronic pain 10/24/2016   Past Medical History:  Past Medical History:  Diagnosis Date  . Diabetes mellitus without complication (HCC)   . Hypertension   . Kidney stone   . Stroke (HCC)   . Vertigo    Past Surgical History:  Past Surgical History:  Procedure Laterality Date  . ABDOMINAL HYSTERECTOMY    . KIDNEY STONE SURGERY     HPI:  77 year old female admitted 03/23/17 with head and neck pain, left face numbness, and decreased word retrieval. 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   Assessment / Plan / Recommendation Clinical Impression  Pt presents with baseline mild word retrieval deficits, residual from October 2018 CVA. Pt is observed and reported to be at baseline level of function. She was discharged from outpatient speech therapy in February 2019, and reports having HEP provided by SLP. Pt and family report decline in verbal expression when fatigued. She was encouraged to continue to work on the home exercise program, but to notify PCP if difficulty worsens. No further ST intervention is recommended at this time. Please reconsult if needs arise.    SLP Assessment  SLP Recommendation/Assessment: All further Speech Language Pathology  needs can be addressed in the next venue of care SLP Visit Diagnosis: Cognitive communication deficit (R41.841)    Follow Up Recommendations  None(rec return to  outpatient therapy if neeeds arise.)          SLP Evaluation Cognition  Overall Cognitive Status: Within Functional Limits for tasks assessed Arousal/Alertness: Awake/alert Orientation Level: Oriented X4       Comprehension  Auditory Comprehension Overall Auditory Comprehension: Appears within functional limits for tasks assessed    Expression Expression Primary Mode of Expression: Verbal Verbal Expression Overall Verbal Expression: Impaired at baseline(mild word retrieval deficits, residual from October 2018 CVA) Written Expression Dominant Hand: Right   Oral / Motor  Oral Motor/Sensory Function Overall Oral Motor/Sensory Function: Within functional limits Motor Speech Overall Motor Speech: Appears within functional limits for tasks assessed   GO                   Elica Almas B. Murvin NatalBueche, [facility] LPMSP, CCC-SLP Speech Language Pathologist [phone]  Leigh AuroraBueche, Campbell Kray Brown 03/24/2017, 12:25 PM

## 2017-03-24 NOTE — Evaluation (Signed)
Physical Therapy Evaluation and Discharge Patient Details Name: Jill NuttingCarol Howard MRN: [id number] DOB: Sep 28, 1940 Today's Date: 03/24/2017   History of Present Illness  77 y.o. female with medical history significant of CVA in October 2018, diabetes mellitus type 2, hypertension, hyperlipidemia and nephrolithiasis [facility] with complaints of sudden onset of head and neck pain with left-sided facial numbness.  MRI does not show new infarcts. 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 All education has been completed and the patient has no further questions. PTA, pt living with husband with 24 hour support with stairs to enter 1 level home. Pt was independent with mobility with history of long course of PT to address balance and prior stroke. Upon eval pt presents near baseline strength, however with R visual field deficits, and mild balance deficits. Pt is supervision for ambulation and stairs, and denies recommendations for therapy recs for cont improvement of balance. NO PT follow up needed at this time, encouraged patient to continue her previous HEPs to maintain safety. Pt and family report no concerns for d/c home toady.  PT is signing off. Thank you for this referral.     Follow Up Recommendations No PT follow up;Supervision for mobility/OOB    Equipment Recommendations  None recommended by PT    Recommendations for Other Services       Precautions / Restrictions Precautions Precautions: None Restrictions Weight Bearing Restrictions: No      Mobility  Bed Mobility Overal bed mobility: Independent                Transfers Overall transfer level: Independent Equipment used: None                Ambulation/Gait Ambulation/Gait assistance: Supervision Ambulation Distance (Feet): 200 Feet Assistive device: None Gait  Pattern/deviations: WFL(Within Functional Limits) Gait velocity: mild decrease   General Gait Details: patients gait quality decreases with dual tasking and head turning. able to mintain stability and reports no concerns for safety.   Stairs Stairs: Yes Stairs assistance: Supervision Stair Management: Two rails;Alternating pattern Number of Stairs: 8 General stair comments: alternating pattern, cued for step to to maximize safety, educated husband on guarding.   Wheelchair Mobility    Modified Rankin (Stroke Patients Only)       Balance Overall balance assessment: Needs assistance   Sitting balance-Leahy Scale: Good       Standing balance-Leahy Scale: Fair Standing balance comment: (romberg elicits sway quickly, cannot tolerate tandem stance)                             Pertinent Vitals/Pain Pain Assessment: No/denies pain Faces Pain Scale: Hurts a little bit Pain Location: L neck Pain Descriptors / Indicators: Discomfort Pain Intervention(s): Monitored during session    Home Living Family/patient expects to be discharged to:: Private residence Living Arrangements: Spouse/significant other Available Help at Discharge: Family;Available 24 hours/day Type of Home: House Home Access: Stairs to enter Entrance Stairs-Rails: Can reach both Entrance Stairs-Number of Steps: 6ish steps however only has to do 2 at a time Home Layout: One level Home Equipment: Bedside commode Additional Comments: Not able to get all home information as transport arrived for MRI.     Prior Function Level of Independence: Independent         Comments: does not drive since last CVA; has  difficulty reading     Hand Dominance   Dominant Hand: Right    Extremity/Trunk Assessment   Upper Extremity Assessment Upper Extremity Assessment: Defer to OT evaluation    Lower Extremity Assessment Lower Extremity Assessment: (BLE strength 4/5)    Cervical / Trunk  Assessment Cervical / Trunk Assessment: Normal  Communication   Communication: Expressive difficulties(word finding at times)  Cognition Arousal/Alertness: Awake/alert Behavior During Therapy: WFL for tasks assessed/performed Overall Cognitive Status: Within Functional Limits for tasks assessed                                        General Comments General comments (skin integrity, edema, etc.): extensive conversation with family regarding safety considerations at home    Exercises Other Exercises Other Exercises: Educated on use of "anchors" to increase awareness of termianl endof page; educate on visual acanning tasks, i.e. "poor man's dynavision"   Assessment/Plan    PT Assessment Patent does not need any further PT services  PT Problem List         PT Treatment Interventions      PT Goals (Current goals can be found in the Care Plan section)  Acute Rehab PT Goals Patient Stated Goal: to follow up with eye apointment.  PT Goal Formulation: With patient/family Time For Goal Achievement: 03/31/17 Potential to Achieve Goals: Good    Frequency     Barriers to discharge        Co-evaluation               AM-PAC PT "6 Clicks" Daily Activity  Outcome Measure Difficulty turning over in bed (including adjusting bedclothes, sheets and blankets)?: None Difficulty moving from lying on back to sitting on the side of the bed? : None Difficulty sitting down on and standing up from a chair with arms (e.g., wheelchair, bedside commode, etc,.)?: None Help needed moving to and from a bed to chair (including a wheelchair)?: None Help needed walking in hospital room?: A Little Help needed climbing 3-5 steps with a railing? : A Little 6 Click Score: 22    End of Session Equipment Utilized During Treatment: Gait belt Activity Tolerance: Patient tolerated treatment well Patient left: in bed;with call bell/phone within reach;with family/visitor present Nurse  Communication: Mobility status PT Visit Diagnosis: Unsteadiness on feet (R26.81);Other symptoms and signs involving the nervous system (R29.898)    Time: 1130-1200 PT Time Calculation (min) (ACUTE ONLY): 30 min   Charges:   PT Evaluation $PT Eval Moderate Complexity: 1 Mod PT Treatments $Gait Training: 8-22 mins   PT G Codes:        Etta Grandchild, PT, DPT Acute Rehab Services Pager: [phone]    Etta Grandchild 03/24/2017, 12:42 PM

## 2017-03-24 NOTE — Progress Notes (Signed)
Patient discharged home with family. Discharge information given. Patient questions asked and answered. IV and telemetry discontinued. Patient transported from unit via wheelchair with volunteer services. Lawson RadarHeather M Braedyn Riggle

## 2017-03-24 NOTE — Discharge Summary (Signed)
Physician Discharge Summary  Jill NuttingCarol Howard ZOX:[id number] DOB: 1940/10/23 DOA: 03/23/2017  PCP: Dorthula MatasJudd, Donna J, PA-C  Admit date: 03/23/2017 Discharge date: 03/24/2017  Admitted From: Home Disposition:  Home  Recommendations for Outpatient Follow-up:  1. Follow up with PCP in 1 week 2. Follow-up with neurology/Dr. Pearlean BrownieSethi in 2 weeks   Home Health: No Equipment/Devices: None  Discharge Condition: Stable CODE STATUS: Full Diet recommendation: Heart Healthy / Carb Modified   Brief/Interim Summary: 77 y.o. female with medical history significant of CVA in October 2018, diabetes mellitus type 2, hypertension, hyperlipidemia and nephrolithiasis [facility] with complaints of sudden onset of head and neck pain with left-sided facial numbness.  Initial CT scan of the head was negative for acute abnormality.  CT angiogram of the head showed intracranial stenosis.  [facility] was [facility] work[facility].  Neurology evaluated the patient.  MRI of the brain was negative for acute stroke.  Patient's symptoms have almost resolved.  She has tolerated physical therapy and is tolerating diet.  Neurology has cleared the patient for discharge.    Discharge Diagnoses:  Active Problems:   Essential hypertension   Diabetes mellitus with complication (HCC)   CVA (cerebral vascular accident) (HCC)   Neurological symptoms   Probable TIA presenting with headache/facial numbness and left upper extremity mild weakness in a patient with history of CVA -Initial CT of the head did not show any acute abnormality.  CT angiogram of the head and neck done as per telemetry neurology recommendation showed significant intracranial stenosis but stable compared to prior CT angiogram from October 2018. -MRI of the brain was negative for acute stroke. -Echocardiogram has been done.  Report is pending.  This can be followed up as an outpatient.  Neurology has cleared the  patient for discharge.  Outpatient follow-up with neurology/Dr. Pearlean BrownieSethi in 2 weeks -Patient has tolerated diet and physical therapy -Continue Plavix.  Aspirin 81 mg daily as per neurology recommendations.  Continue statin.    Hypertension -Blood pressure stable.  Outpatient follow-up.  Continue outpatient antihypertensives  Diabetes mellitus type 2 -Resume home regimen.  Outpatient follow-up  Hyperlipidemia -Continue statin    Discharge Instructions  Discharge Instructions    Ambulatory referral to Neurology   Complete by:  As directed    An appointment is requested in approximately: 2 weeks   Call MD for:  difficulty breathing, headache or visual disturbances   Complete by:  As directed    Call MD for:  extreme fatigue   Complete by:  As directed    Call MD for:  hives   Complete by:  As directed    Call MD for:  persistant dizziness or light-headedness   Complete by:  As directed    Call MD for:  persistant nausea and vomiting   Complete by:  As directed    Call MD for:  severe uncontrolled pain   Complete by:  As directed    Call MD for:  temperature >100.4   Complete by:  As directed    Diet - low sodium heart healthy   Complete by:  As directed    Diet Carb Modified   Complete by:  As directed    Increase activity slowly   Complete by:  As directed      Allergies as of 03/24/2017      Reactions   Influenza Vaccines Swelling   Arm was swollen in 2002    Sitagliptin  Swelling   Guaifenesin Other (See Comments)   Unknown   Latex Other (See Comments)   Unknown   Other Other (See Comments)   Band-Aid   Phenylephrine-pheniramine-dm Other (See Comments)   Unknown   Ultram [tramadol Hcl] Other (See Comments)   hallucinations   Escitalopram Oxalate Other (See Comments)   Hallucinations      Medication List    TAKE these medications   amLODipine 5 MG tablet Commonly known as:  NORVASC Take 5 mg by mouth daily.   aspirin 81 MG tablet Take 1 tablet (81 mg  total) by mouth daily.   atorvastatin 80 MG tablet Commonly known as:  LIPITOR Take 1 tablet (80 mg total) by mouth at bedtime.   canagliflozin 300 MG Tabs tablet Commonly known as:  INVOKANA Take 300 mg by mouth.   clopidogrel 75 MG tablet Commonly known as:  PLAVIX Take 1 tablet (75 mg total) by mouth daily.   Fish Oil 1000 MG Caps Take daily by mouth.   furosemide 20 MG tablet Commonly known as:  LASIX Take 20 mg as needed by mouth.   glyBURIDE 5 MG tablet Commonly known as:  DIABETA Take 5 mg by mouth 2 (two) times daily with a meal.   lisinopril 20 MG tablet Commonly known as:  PRINIVIL,ZESTRIL Take 20 mg by mouth daily.   meclizine 12.5 MG tablet Commonly known as:  ANTIVERT Take 12.5 mg by mouth 3 (three) times daily as needed for dizziness.   metFORMIN 1000 MG tablet Commonly known as:  GLUCOPHAGE Take 1,000 mg by mouth 2 (two) times daily with a meal.   TOUJEO SOLOSTAR [place] Inject 40 Units into the skin.   TRULICITY [place] Inject into the skin.   vitamin B-12 1000 MCG tablet Commonly known as:  CYANOCOBALAMIN Take by mouth.   Vitamin D3 5000 units Caps Take 1,000 Units by mouth.      Follow-up Information    Micki Riley, MD. Schedule an appointment as soon as possible for a visit in 2 week(s).   Specialties:  Neurology, Radiology Contact information: [address] [place] [address] [phone]        [name], PA-C. Schedule an appointment as soon as possible for a visit in 1 week(s).   Specialty:  Internal Medicine Contact information: [address] [place] [address] [phone]          Allergies  Allergen Reactions  . Influenza Vaccines Swelling    Arm was swollen in 2002   . Sitagliptin Swelling  . Guaifenesin Other (See Comments)    Unknown  . Latex Other (See Comments)    Unknown  . Other Other (See Comments)    Band-Aid  . Phenylephrine-Pheniramine-Dm Other (See Comments)    Unknown  .  Ultram [Tramadol Hcl] Other (See Comments)    hallucinations  . Escitalopram Oxalate Other (See Comments)    Hallucinations    Consultations:  Neurology   Procedures/Studies: Ct Angio Head W Or Wo Contrast  Result Date: 03/23/2017 CLINICAL DATA:  77 year old female code stroke. Head pain and left facial numbness. EXAM: CT ANGIOGRAPHY HEAD AND NECK TECHNIQUE: Multidetector CT imaging of the head and neck was performed using the standard protocol during bolus administration of intravenous contrast. Multiplanar CT image reconstructions and MIPs were obtained to evaluate the vascular anatomy. Carotid stenosis measurements (when applicable) are obtained utilizing NASCET criteria, using the distal internal carotid diameter as the denominator. CONTRAST:  80mL ISOVUE-370 IOPAMIDOL (  ISOVUE-370) INJECTION 76% COMPARISON:  Head CT without contrast 0843 hours today. CTA head and neck and CT perfusion 10/24/2016. FINDINGS: CTA NECK Skeleton: Absent dentition. Stable cervical spine degeneration. No acute osseous abnormality identified. Upper chest: Negative visible lung apices. No superior mediastinal lymphadenopathy. Other neck: Partially retropharyngeal course of the carotids, more so the right. Otherwise negative. No neck mass or lymphadenopathy. Aortic arch: Stable arch with minimal atherosclerosis. Three vessel arch configuration. Stable great vessel origins without significant stenosis. Right carotid system: Negative right CCA aside from a partially retropharyngeal course. Calcified plaque of the proximal right ICA is stable without stenosis. Tortuous cervical right ICA with a kinked appearance, stable to the skull base. Left carotid system: Minimal calcified plaque at the left CCA origin without stenosis. Mildly tortuous left CCA. Mild soft and calcified plaque at the left carotid bifurcation is stable without stenosis. Tortuous cervical left ICA with a kinked appearance redemonstrated and stable to the skull  base. Vertebral arteries: No proximal right subclavian artery stenosis. The right vertebral artery origin is stable and normal. Normal right vertebral artery to the skull base. Mild proximal left subclavian atherosclerosis without stenosis is stable. Left vertebral artery origin is normal. The vertebral arteries are fairly codominant. The left vertebral artery is mildly tortuous but otherwise normal to the skull base. CTA HEAD Posterior circulation: Mild bilateral V4 segment calcified plaque without stenosis is stable. Both PICA origins remain patent. Patent vertebrobasilar junction without stenosis. Patent basilar artery without stenosis. Bilateral SCA and PCA origins remain normal. Posterior communicating arteries are diminutive or absent. Mild right PCA irregularity and stenosis in the distal P1 or P2 segment is stable (series 11, image 46). Distal PCA branches appear stable and patent. Anterior circulation: Both ICA siphons remain patent. Moderate calcified plaque on the right is redemonstrated with no significant stenosis. Moderate to severe calcified plaque of the left ICA siphon is redemonstrated with severe supraclinoid segment stenosis (series 10, image 106). Both ophthalmic artery origins are normal. Patent carotid termini. Normal ACA origins. Anterior communicating artery is normal. Moderate to severe right ACA A2 segment irregularity and stenosis is stable (series 12, image 13). Patent left MCA M1 segment with chronic severe proximal M1 stenosis which appears stable (series 12, image 20). The left MCA bifurcation and left MCA branches appear stable with mild to moderate irregularity but no high-grade stenosis or left MCA branch occlusion identified. The right MCA M1 and right MCA bifurcation are patent without stenosis. Moderate chronic stenosis of the dominant right MCA M2 posterior branch is redemonstrated on series 11, image 46 and stable. Right MCA branches are stable with mild irregularity  otherwise. Venous sinuses: Patent. Anatomic variants: None. Delayed phase: No abnormal enhancement identified. Stable gray-white matter differentiation since 0843 hours today, including chronic left parietal and occipital lobe encephalomalacia. Review of the MIP images confirms the above findings IMPRESSION: 1. Negative for emergent large vessel occlusion. 2. Stable CTA Head and neck compared to 10/24/2016. There is mild extracranial but advanced intracranial atherosclerosis with the following significant stenoses: - moderate to severe Left ICA supraclinoid segment stenosis. - severe Left MCA M1 stenosis. - moderate to severe Right ACA A2 stenosis. - moderate Right MCA posterior right M2 branch stenosis. - mild to moderate Right PCA P2 stenosis. 3. Stable CT appearance of the brain since 0843 hours today. Electronically Signed   By: Odessa Fleming M.D.   On: 03/23/2017 10:07   Ct Angio Neck W And/or Wo Contrast  Result Date: 03/23/2017 CLINICAL DATA:  77 year old female code stroke. Head pain and left facial numbness. EXAM: CT ANGIOGRAPHY HEAD AND NECK TECHNIQUE: Multidetector CT imaging of the head and neck was performed using the standard protocol during bolus administration of intravenous contrast. Multiplanar CT image reconstructions and MIPs were obtained to evaluate the vascular anatomy. Carotid stenosis measurements (when applicable) are obtained utilizing NASCET criteria, using the distal internal carotid diameter as the denominator. CONTRAST:  80mL ISOVUE-370 IOPAMIDOL (ISOVUE-370) INJECTION 76% COMPARISON:  Head CT without contrast 0843 hours today. CTA head and neck and CT perfusion 10/24/2016. FINDINGS: CTA NECK Skeleton: Absent dentition. Stable cervical spine degeneration. No acute osseous abnormality identified. Upper chest: Negative visible lung apices. No superior mediastinal lymphadenopathy. Other neck: Partially retropharyngeal course of the carotids, more so the right. Otherwise negative. No neck  mass or lymphadenopathy. Aortic arch: Stable arch with minimal atherosclerosis. Three vessel arch configuration. Stable great vessel origins without significant stenosis. Right carotid system: Negative right CCA aside from a partially retropharyngeal course. Calcified plaque of the proximal right ICA is stable without stenosis. Tortuous cervical right ICA with a kinked appearance, stable to the skull base. Left carotid system: Minimal calcified plaque at the left CCA origin without stenosis. Mildly tortuous left CCA. Mild soft and calcified plaque at the left carotid bifurcation is stable without stenosis. Tortuous cervical left ICA with a kinked appearance redemonstrated and stable to the skull base. Vertebral arteries: No proximal right subclavian artery stenosis. The right vertebral artery origin is stable and normal. Normal right vertebral artery to the skull base. Mild proximal left subclavian atherosclerosis without stenosis is stable. Left vertebral artery origin is normal. The vertebral arteries are fairly codominant. The left vertebral artery is mildly tortuous but otherwise normal to the skull base. CTA HEAD Posterior circulation: Mild bilateral V4 segment calcified plaque without stenosis is stable. Both PICA origins remain patent. Patent vertebrobasilar junction without stenosis. Patent basilar artery without stenosis. Bilateral SCA and PCA origins remain normal. Posterior communicating arteries are diminutive or absent. Mild right PCA irregularity and stenosis in the distal P1 or P2 segment is stable (series 11, image 46). Distal PCA branches appear stable and patent. Anterior circulation: Both ICA siphons remain patent. Moderate calcified plaque on the right is redemonstrated with no significant stenosis. Moderate to severe calcified plaque of the left ICA siphon is redemonstrated with severe supraclinoid segment stenosis (series 10, image 106). Both ophthalmic artery origins are normal. Patent carotid  termini. Normal ACA origins. Anterior communicating artery is normal. Moderate to severe right ACA A2 segment irregularity and stenosis is stable (series 12, image 13). Patent left MCA M1 segment with chronic severe proximal M1 stenosis which appears stable (series 12, image 20). The left MCA bifurcation and left MCA branches appear stable with mild to moderate irregularity but no high-grade stenosis or left MCA branch occlusion identified. The right MCA M1 and right MCA bifurcation are patent without stenosis. Moderate chronic stenosis of the dominant right MCA M2 posterior branch is redemonstrated on series 11, image 46 and stable. Right MCA branches are stable with mild irregularity otherwise. Venous sinuses: Patent. Anatomic variants: None. Delayed phase: No abnormal enhancement identified. Stable gray-white matter differentiation since 0843 hours today, including chronic left parietal and occipital lobe encephalomalacia. Review of the MIP images confirms the above findings IMPRESSION: 1. Negative for emergent large vessel occlusion. 2. Stable CTA Head and neck compared to 10/24/2016. There is mild extracranial but advanced intracranial atherosclerosis with the following significant stenoses: - moderate to severe Left ICA supraclinoid  segment stenosis. - severe Left MCA M1 stenosis. - moderate to severe Right ACA A2 stenosis. - moderate Right MCA posterior right M2 branch stenosis. - mild to moderate Right PCA P2 stenosis. 3. Stable CT appearance of the brain since 0843 hours today. Electronically Signed   By: Odessa Fleming M.D.   On: 03/23/2017 10:07   Mr Brain Wo Contrast  Result Date: 03/23/2017 CLINICAL DATA:  Acute onset posterior sharp head pain radiates to the neck this morning. LEFT facial numbness and word-finding difficulties today. History of stroke, hypertension, diabetes. On Plavix. EXAM: MRI HEAD WITHOUT CONTRAST TECHNIQUE: Multiplanar, multiecho pulse sequences of the brain and surrounding  structures were obtained without intravenous contrast. COMPARISON:  MRI of the head October 28, 2016 and CT/CTA HEAD March 23, 2017 FINDINGS: INTRACRANIAL CONTENTS: No reduced diffusion to suggest acute ischemia. Spurious reduced diffusion associated with susceptibility artifact and old LEFT temporal parietoccipital lobe infarcts. Mild ex vacuo dilatation LEFT occipital horn. Ventricles and sulci are otherwise normal for patient's age. A few scattered subcentimeter supratentorial white matter FLAIR T2 hyperintensities compatible with mild chronic small vessel ischemic disease, less than expected for age. No midline shift, mass effect or masses. No abnormal extra-axial fluid collections. VASCULAR: Normal major intracranial vascular flow voids present at skull base. SKULL AND UPPER CERVICAL SPINE: No abnormal sellar expansion. No suspicious calvarial bone marrow signal. Craniocervical junction maintained. SINUSES/ORBITS: Trace paranasal sinus mucosal thickening. Trace LEFT greater than RIGHT mastoid effusions. The included ocular globes and orbital contents are non-suspicious. Status post bilateral ocular lens implants. OTHER: Patient is edentulous. IMPRESSION: 1. No acute intracranial process. 2. Old LEFT MCA/posterior watershed territory infarcts. Electronically Signed   By: Awilda Metro M.D.   On: 03/23/2017 20:18   Ct Head Code Stroke Wo Contrast`  Result Date: 03/23/2017 CLINICAL DATA:  Code stroke. Sharp pain in back of head beginning 1 hour ago. Left facial numbness. EXAM: CT HEAD WITHOUT CONTRAST TECHNIQUE: Contiguous axial images were obtained from the base of the skull through the vertex without intravenous contrast. COMPARISON:  10/28/2016 FINDINGS: Brain: No evidence of acute infarction, hemorrhage, hydrocephalus, extra-axial collection or mass lesion/mass effect. There is a sizable remote left temporoparietal and occipital infarct with expected evolution since prior. Chronic small vessel  ischemic change in the cerebral white matter without detected progression. Vascular: Atherosclerotic calcification.  No hyperdense vessel. Skull: Negative Sinuses/Orbits: Bilateral cataract resection.  No acute finding. Other: These results were called by telephone at the time of interpretation on 03/23/2017 at 8:47 am to Dr. Doug Sou , who verbally acknowledged these results. ASPECTS [facility], [facility] Stroke Program Early CT Score) - Ganglionic level infarction (caudate, lentiform nuclei, internal capsule, insula, M1-M3 cortex): 7 - Supraganglionic infarction (M4-M6 cortex): 3 Total score (0-10 with 10 being normal): 10 IMPRESSION: 1. No acute finding. 2. Remote left posterior cerebral infarct. Electronically Signed   By: Marnee Spring M.D.   On: 03/23/2017 08:48    Echo report pending   Subjective: Patient seen and examined at bedside.  She denies any overnight fever, nausea or vomiting.  Her headache is almost resolved.  Her facial numbness is almost resolved.  Discharge Exam: Vitals:   03/24/17 0600 03/24/17 0727  BP: (!) 94/52 (!) 126/56  Pulse: 74 73  Resp: 18 18  Temp: 98.5 F (36.9 C) 98.5 F (36.9 C)  SpO2: 99% 96%   Vitals:   03/24/17 0220 03/24/17 0400 03/24/17 0600 03/24/17 0727  BP: (!) 140/54 126/70 (!) 94/52 (!) 126/56  Pulse: 68  79 74 73  Resp: 16 16 18 18   Temp:  97.7 F (36.5 C) 98.5 F (36.9 C) 98.5 F (36.9 C)  TempSrc:  Oral Oral Oral  SpO2: 96% 98% 99% 96%  Weight:      Height:        General: Pt is alert, awake, not in acute distress Cardiovascular: Rate controlled, S1/S2 + Respiratory: Bilateral decreased breath sounds at bases Abdominal: Soft, NT, ND, bowel sounds + Extremities: no edema, no cyanosis    The results of significant diagnostics from this hospitalization (including imaging, microbiology, ancillary and laboratory) are listed below for reference.     Microbiology: No results found for this or any previous visit (from the past 240  hour(s)).   Labs: BNP (last 3 results) No results for input(s): BNP in the last 8760 hours. Basic Metabolic Panel: Recent Labs  Lab 03/23/17 0850 03/23/17 2039 03/24/17 0643  NA 137  --  139  K 4.0  --  4.0  CL 106  --  110  CO2 19*  --  20*  GLUCOSE 270*  --  138*  BUN 19  --  15  CREATININE 0.69 0.88 0.63  CALCIUM 9.4  --  9.0  MG  --   --  1.5*   Liver Function Tests: Recent Labs  Lab 03/24/17 0643  AST 17  ALT 15  ALKPHOS 56  BILITOT 0.7  PROT 6.2*  ALBUMIN 3.1*   No results for input(s): LIPASE, AMYLASE in the last 168 hours. No results for input(s): AMMONIA in the last 168 hours. CBC: Recent Labs  Lab 03/23/17 0850 03/23/17 2039 03/24/17 0643  WBC 6.9 6.6 5.0  NEUTROABS 3.1  --  1.9  HGB 13.1 12.0 11.7*  HCT 39.7 36.9 36.7  MCV 87.8 88.1 89.1  PLT 199 202 177   Cardiac Enzymes: No results for input(s): CKTOTAL, CKMB, CKMBINDEX, TROPONINI in the last 168 hours. BNP: Invalid input(s): POCBNP CBG: Recent Labs  Lab 03/23/17 0831 03/23/17 2133 03/24/17 0602  GLUCAP 249* 216* 132*   D-Dimer No results for input(s): DDIMER in the last 72 hours. Hgb A1c Recent Labs    03/24/17 0643  HGBA1C 7.2*   Lipid Profile Recent Labs    03/24/17 0643  CHOL 116  HDL 33*  LDLCALC 51  TRIG 696*  CHOLHDL 3.5   Thyroid function studies No results for input(s): TSH, T4TOTAL, T3FREE, THYROIDAB in the last 72 hours.  Invalid input(s): FREET3 Anemia work up No results for input(s): VITAMINB12, FOLATE, FERRITIN, TIBC, IRON, RETICCTPCT in the last 72 hours. Urinalysis    Component Value Date/Time   COLORURINE YELLOW 10/28/2016 0736   APPEARANCEUR CLOUDY (A) 10/28/2016 0736   LABSPEC 1.007 10/28/2016 0736   PHURINE 5.0 10/28/2016 0736   GLUCOSEU >=500 (A) 10/28/2016 0736   HGBUR SMALL (A) 10/28/2016 0736   BILIRUBINUR NEGATIVE 10/28/2016 0736   KETONESUR NEGATIVE 10/28/2016 0736   PROTEINUR NEGATIVE 10/28/2016 0736   NITRITE NEGATIVE 10/28/2016  0736   LEUKOCYTESUR LARGE (A) 10/28/2016 0736   Sepsis Labs Invalid input(s): PROCALCITONIN,  WBC,  LACTICIDVEN Microbiology No results found for this or any previous visit (from the past 240 hour(s)).   Time coordinating discharge: 35 minutes  SIGNED:   Glade Lloyd, MD  [facility] 03/24/2017, 11:10 AM Pager: [phone]  If 7PM-7AM, please contact night-coverage [email or web address] Password [email or web address]

## 2017-03-24 NOTE — Progress Notes (Signed)
Occupational Therapy Evaluation Patient Details Name: Jill NuttingCarol Howard MRN: [id number] DOB: [birth year] Today's Date: 03/24/2017    History of Present Illness 77 y.o. female with medical history significant of CVA in October 2018, diabetes mellitus type 2, hypertension, hyperlipidemia and nephrolithiasis presented [facility] on 03/23/2017 with complaints of sudden onset of head and neck pain with left-sided facial numbness.  MRI does not show new infarcts. Old MCA/posterior territory watershed infarcts   Clinical Impression   PTA, pt living at home with husband and was completing ADl and mobility @ modified independent level. Pt states her vision is "worse after the episode". Pt complains of being a "little dizzy" yesterday but today is better.  Pt demonstrates a R field deficit. Pt had learned compensatory strategies from outpt setting. Educated pt on additional strategies and activities. Educated on reducing risk of falls at home. Pt has a follow up appt with her eye doctor for a full field assessment in April. At this time, pt does not feel that she needs further outpt OT. Had discussion with pt that if she does not progress as expected, she can discuss need for outpt OT at a later date with her MD. Pt safe to DC home with family when medically stable.     Follow Up Recommendations  Supervision - Intermittent    Equipment Recommendations  None recommended by OT    Recommendations for Other Services       Precautions / Restrictions Restrictions Weight Bearing Restrictions: Yes      Mobility Bed Mobility Overal bed mobility: Independent                Transfers                      Balance Overall balance assessment: Needs assistance   Sitting balance-Leahy Scale: Good       Standing balance-Leahy Scale: Fair Standing balance comment: states she was dizzy yesterday but better today; will defet testing to PT                           ADL  either performed or assessed with clinical judgement   ADL Overall ADL's : At baseline                                             Vision Baseline Vision/History: Wears glasses Wears Glasses: Reading only Patient Visual Report: Other (comment);Peripheral vision impairment(increaed R sided deficit) Vision Assessment?: Yes Eye Alignment: Within Functional Limits Ocular Range of Motion: Within Functional Limits Alignment/Gaze Preference: Within Defined Limits Tracking/Visual Pursuits: Decreased smoothness of horizontal tracking Saccades: Additional eye shifts occurred during testing Convergence: Within functional limits Visual Fields: Right visual field deficit     Perception     Praxis      Pertinent Vitals/Pain Pain Assessment: Faces Faces Pain Scale: Hurts a little bit Pain Location: L neck Pain Descriptors / Indicators: Discomfort Pain Intervention(s): Limited activity within patient's tolerance     Hand Dominance Right   Extremity/Trunk Assessment Upper Extremity Assessment Upper Extremity Assessment: Overall WFL for tasks assessed   Lower Extremity Assessment Lower Extremity Assessment: Defer to PT evaluation   Cervical / Trunk Assessment Cervical / Trunk Assessment: Normal   Communication Communication Communication: Expressive difficulties   Cognition Arousal/Alertness: Awake/alert Behavior [facility] for  tasks assessed/performed Overall Cognitive Status: History of cognitive impairments - at baseline                                     General Comments       Exercises Exercises: Other exercises Other Exercises Other Exercises: Educated on use of "anchors" to increase awareness of termianl endof page; educate on visual acanning tasks, i.e. "poor man's dynavision"   Shoulder Instructions      Home Living Family/patient expects to be discharged to:: Private residence Living Arrangements: Spouse/significant  other Available Help at Discharge: Family;Available 24 hours/day Type of Home: House Home Access: Stairs to enter [employer] of Steps: 6ish steps however only has to do 2 at a time         Bathroom Shower/Tub: Tub/shower unit;Walk-in shower   Bathroom Toilet: Standard Bathroom Accessibility: Yes   Home Equipment: Bedside commode   Additional Comments: Not able to get all home information as transport arrived for MRI.       Prior Functioning/Environment Level of Independence: Independent        Comments: does not drive since last CVA; has difficulty reading        OT Problem List: Impaired balance (sitting and/or standing);Impaired vision/perception      OT Treatment/Interventions:      OT Goals(Current goals can be found in the care plan section) Acute Rehab OT Goals Patient Stated Goal: to drive again OT Goal Formulation: All assessment and education complete, DC therapy  OT Frequency:     Barriers to D/C:            Co-evaluation              AM-PAC PT "6 Clicks" Daily Activity     Outcome Measure Help from another person eating meals?: None Help from another person taking care of personal grooming?: None Help from another person toileting, which includes using toliet, bedpan, or urinal?: None Help from another person bathing (including washing, rinsing, drying)?: None Help from another person to put on and taking off regular upper body clothing?: None Help from another person to put on and taking off regular lower body clothing?: None 6 Click Score: 24   End of Session Nurse Communication: Mobility status  Activity Tolerance: Patient tolerated treatment well Patient left: in bed;with call bell/phone within reach;Other (comment)(with PT)  OT Visit Diagnosis: Unsteadiness on feet (R26.81);Low vision, both eyes (H54.2)                Time: 1610-9604 OT Time Calculation (min): 20 min Charges:  OT General Charges $OT Visit: 1 Visit OT  Evaluation $OT Eval Low Complexity: 1 Low G-Codes:     Nillie Bartolotta, OT/L  [phone] 03/24/2017  Tannor Pyon,HILLARY 03/24/2017, 11:59 AM

## 2017-03-27 ENCOUNTER — Telehealth: Payer: Self-pay | Admitting: Neurology

## 2017-03-27 NOTE — Telephone Encounter (Signed)
Dr.Sethi see note. Pt already has appt with Eber Jonesarolyn NP in May 2019. Does she need to be seen within 2 weeks per discharge note thanks.

## 2017-03-27 NOTE — Telephone Encounter (Signed)
Pt called needing a hospital follow up with Dr. Pearlean BrownieSethi within 2 weeks. As of right now nothing is available on the schedule pt will need to be worked in

## 2017-03-27 NOTE — Telephone Encounter (Signed)
RN call patient that Dr. Pearlean BrownieSEthi reviewed her hospital stay to the ED. She can keep the appt in May 2019 with Eber Jonesarolyn. Rn stated is she has any concerns or more sympts she can be seen sooner. Pt stated she is doing better. PT verbalized understanding.

## 2017-03-27 NOTE — Telephone Encounter (Signed)
Kindly call the patient and if she is doing well keep the appointment in May with Eber Jonesarolyn. In case there are any unanswered questions or urgent needs then I may see her sooner

## 2017-03-29 ENCOUNTER — Telehealth: Payer: Self-pay | Admitting: Neurology

## 2017-03-29 MED ORDER — ASPIRIN EC 325 MG PO TBEC: 325.0000 mg | DELAYED_RELEASE_TABLET | Freq: Every day | ORAL | 0 refills | Status: DC

## 2017-03-29 NOTE — Telephone Encounter (Signed)
[facility]-(Donna Judd/PCP) [phone] called the provider saw the pt today and is referring the pt to a vascular neurosurgeon due to "clot deep in the brain". She is wanting to know if there is a particular provider Dr Pearlean BrownieSethi would want to recommend. Pt is wanting to stay within [place].  Please call to advise.

## 2017-03-29 NOTE — Telephone Encounter (Addendum)
I agree with Dr. 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 She has been [facility] by [facility]. Her recently admission did not show new stroke and she was put back on DAPT. Her intracranial vascular stenosis are treated now with medical management, no need for intervention at this time. Also, vascular surgery will not deal with intracranial vascular stenosis. Due to severe stenosis of left tICA and M1, BP goal 130-150. Will continue current treatment. No need further referral at this time. She will follow up with Dr. Pearlean BrownieSethi and Darrol Angelarolyn Martin NP at stroke clinic.   I called pt back and answered her questions. She said her PCP increased her norvasc to 7.5mg  daily. I asked her to check BP daily and record and BP goal 130-150. I also told her there is no need of any vascular surgery at this time. Continue medical management. She said her PCP would like to increase her antiplatelet dose. Currently she is on ASA 81 with plavix 75, I am OK to increase her ASA to 325 for intracranial stenosis. I told her to use ASA EC 325 mg given her hx of gastric ulcer. She expressed understanding and appreciation.   Marvel PlanJindong Gayle Collard, MD PhD Stroke Neurology 03/29/2017 5:42 PM  Meds ordered this encounter  Medications  . aspirin EC 325 MG tablet    Sig: Take 1 tablet (325 mg total) by mouth daily.    Dispense:  30 tablet    Refill:  0

## 2017-03-29 NOTE — Telephone Encounter (Signed)
Dr. Roda ShuttersXu saw the patient in the hospital and patient needs to follow-up with him. I do not believe patient needs to see any vascular neurosurgeon.

## 2017-03-29 NOTE — Telephone Encounter (Signed)
Dr.Sethi, please advise see note below.

## 2017-03-29 NOTE — Addendum Note (Signed)
Addended by: Marvel PlanXU, Sergio Hobart on: 03/29/2017 05:56 PM   Modules accepted: Orders

## 2017-03-30 NOTE — Telephone Encounter (Signed)
Rn call [facility]. Dawn stated the pt saw her PCP and mention it to her during the office visit. The pt told PCP she had a blood clot in her brain and wanted a referral to a vascular neurosurgeon. Rn stated Dr Roda ShuttersXu and Dr. Pearlean BrownieSethi  Stroke MD receive the message about pt wanting a referral, and did not recommend a vascular neurologist per their note. Rn stated Dr.Xu saw patient in hospital 03/24/2017,and nothing is mention about her seeing a vascular neurosurgery. RN also stated they only recommend her increase patient to 325mg , with addition continue taking her plavix daily. Also to keep her blood pressure systolic blood pressure between 130-150. Dawn will let the pts PCP of what Dr. Pearlean BrownieSEthi and Dr. Roda ShuttersXu stated. Rn stated the MD can see the phone note and office notes in care everywhere because they have epic charting.

## 2017-04-25 ENCOUNTER — Encounter: Payer: Self-pay | Admitting: *Deleted

## 2017-04-25 ENCOUNTER — Telehealth: Payer: Self-pay | Admitting: *Deleted

## 2017-04-25 DIAGNOSIS — Z006 Encounter for examination for normal comparison and control in clinical research program: Secondary | ICD-10-CM

## 2017-04-25 NOTE — Progress Notes (Signed)
STROKE_AF Research study month 6 telephone follow up completed. Patient tells me she had TIA on 03/23/17 and was at hospital for observation until 03/24/17. Medication changes are Norvasc increased to 7.5 mg daily ASA increased to 325 mg daily. Modified rankin score 0 and rates her health state 85. She states she gets her driving privileges tomorrow. Next research required visit is due between 03/oct/2019- 28/Nov/2019. I thanked her for her participation in the study.

## 2017-04-25 NOTE — Telephone Encounter (Signed)
STROKE-AF Research study: Left message to call research office for 6 month telephone follow up.

## 2017-06-01 ENCOUNTER — Encounter: Payer: Self-pay | Admitting: Adult Health

## 2017-06-01 ENCOUNTER — Ambulatory Visit (INDEPENDENT_AMBULATORY_CARE_PROVIDER_SITE_OTHER): Payer: Medicare Other | Admitting: Adult Health

## 2017-06-01 VITALS — BP 145/87 | HR 95 | Ht 63.0 in | Wt 169.4 lb

## 2017-06-01 DIAGNOSIS — E118 Type 2 diabetes mellitus with unspecified complications: Secondary | ICD-10-CM

## 2017-06-01 DIAGNOSIS — E785 Hyperlipidemia, unspecified: Secondary | ICD-10-CM

## 2017-06-01 DIAGNOSIS — G459 Transient cerebral ischemic attack, unspecified: Secondary | ICD-10-CM | POA: Diagnosis not present

## 2017-06-01 DIAGNOSIS — I63312 Cerebral infarction due to thrombosis of left middle cerebral artery: Secondary | ICD-10-CM | POA: Diagnosis not present

## 2017-06-01 DIAGNOSIS — I10 Essential (primary) hypertension: Secondary | ICD-10-CM

## 2017-06-01 NOTE — Patient Instructions (Addendum)
Continue aspirin 325 mg daily and clopidogrel 75 mg daily  and lipitor  for secondary stroke prevention  Continue to follow up with PCP regarding cholesterol, diabetes and blood pressure management   Okay to take tylenol for occasional headaches  Continue to monitor blood pressure at home  Continue to stay active and eat healthy  Maintain strict control of hypertension with blood pressure goal below 130/90, diabetes with hemoglobin A1c goal below 6.5% and cholesterol with LDL cholesterol (bad cholesterol) goal below 70 mg/dL. I also advised the patient to eat a healthy diet with plenty of whole grains, cereals, fruits and vegetables, exercise regularly and maintain ideal body weight.  Followup in the future with me in 6 months or call earlier if needed       Thank you for coming to see [place] at [facility]. I hope we have been able to provide you high quality care today.  You may receive a patient satisfaction survey over the next few weeks. We would appreciate your feedback and comments so that we may continue to improve ourselves and the health of our patients.

## 2017-06-01 NOTE — Progress Notes (Signed)
[facility] [address] [place]. [place] [address] [phone]       OFFICE FOLLOW-UP NOTE  Ms. Vinetta Brach Date of Birth:  1940-12-13 Medical Record Number:  [id number]   HPI: Ms Vantassell is a 77 year is not lady seen today for the first office follow-up visit following hospital admission for stroke in October 2018.  She is accompanied by her daughter.  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 She found it hard to use utensils. She had been having a hard time walking at times with leg weakness. These symptoms were off and on and completely resolved in between, hence she did not seek medical attention. She also has had frontal headache ongoing for the past 2 weeks, which is different than her usual migraines.On 10/24/16 morning around 3 AM, she had extreme difficulty walking, could not support her weight and had word finding difficulty, slurred speech and is what prompted the family to bring her to the emergency room [place] for evaluation.She was evaluated clinically and Transferred to [facility] for an MRI/MRA brain. MRI of the brain showed strokes in the posterior division of the left MCA territory. The MRA of the brain also showed stenosis versus subocclusive thrombus in the left M1.LKW: at least days ago if not 2 weeks ago. tpa given?: no, outside the window.  MRI scan of the brain showed acute infarct in the posterior division of the left middle cerebral artery with MRA showing high-grade stenosis of the proximal left middle cerebral artery transthoracic echo showed normal ejection fraction.  LDL  cholesterol was 81 mg percent.  Hemoglobin A1c was 7.  Patient had expressive aphasia and right hand weakness.Patient participated in the stroke atrial fibrillation study.  Patient states her speech has improved but only when she is tired she has some expressive difficulties.  Right hand strength is also improving but she has mild diminished fine motor skills and clumsiness.  She plans to start outpatient physical and occupational therapy tomorrow.  She continues to have right-sided peripheral vision loss.  She is a presently states that her fibromyalgia pain seems to be a lot better since his stroke.  Her blood pressure is well controlled and today it is 101/6 3.  She is tolerating Lipitor well without muscle aches and pains.  She is on aspirin and Plavix and tolerating it well but getting bruising easily though no major bleeding.  Her blood sugar is also doing better and fasting sugars range from 120-130 range.  06/01/17 update: Patient is being seen today for follow-up and recent hospital admission with diagnosis of TIA.  Patient admitted on 03/23/2017 with complaints of mild left facial numbness left-sided occipital headache and very mild left-sided neck pain.  Denies difficulty speaking, difficulty with coordination in any new weakness or numbness.  Denies any recent illness or travel.  She does admit to sleeping poorly over the last 3 days.  All imaging negative for acute findings  And patient was diagnosed with TIA likely from small vessel disease versus cardioembolic.  Patient is currently in atrial fibrillation study and being monitored every 6 months.  As patient was previously taking Plavix, it was recommended  to add back aspirin 81 mg.  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   LDL 51 and recommended to continue Lipitor 80 mg.  Patient was discharged home in stable condition.  In between his appointment and hospital discharge, it was recommended by Dr. Roda Shutters to continue Plavix but increase aspirin from 81 to 325.  Since discharge, patient has been doing well.  She has not had any additional TIA or strokelike symptoms.  She has continued to take both aspirin 325 and Plavix 75 with mild bruising but no bleeding.  Continues to take atorvastatin without side effects of myalgias.  Blood pressure today's visit 145/87 and per patient this is elevated as typical BP 136/68.  Patient recently was seen by ophthalmologist was found that she has scar tissue from past cataract surgery in bilateral eyes and they are planning on removing this in August.  She does have complaints of occasional headaches but these are not debilitating and very rare.  Patient does have history of migraines.  She does admit to taking Tylenol PM nightly to help with sleep and this was advised by her PCP per patient.  Patient back to baseline and doing all activities she previously was.  Denies new or worsening stroke/TIA symptoms.  ROS:   14 system review of systems is positive for headaches and all other systems negative  PMH:  Past Medical History:  Diagnosis Date  . Diabetes mellitus without complication (HCC)   . Hypertension   . Kidney stone   . Stroke (HCC)   . Vertigo     Social History:  Social History   Socioeconomic History  . Marital status: Married    Spouse name: Not on file  . Number of children: Not on file  . Years of education: Not on file  . Highest education level: Not on file  Social Needs  . Financial resource strain: Not on file  . Food insecurity - worry: Not on file  . Food insecurity - inability: Not on file  . Transportation needs - medical: Not on file  . Transportation needs - non-medical: Not on file  Occupational History  . Not on file  Tobacco Use  . Smoking  status: Never Smoker  . Smokeless tobacco: Never Used  Substance and Sexual Activity  . Alcohol use: No  . Drug use: No  . Sexual activity: Not on file  Other Topics Concern  . Not on file  Social History Narrative  . Not on file    Medications:   Current Outpatient Medications on File Prior to Visit  Medication Sig Dispense Refill  . amLODipine (NORVASC) 5 MG tablet Take 5 mg by mouth daily.    Marland Kitchen aspirin 81 MG tablet Take 81 mg by mouth daily.    Marland Kitchen atorvastatin (LIPITOR) 80 MG tablet Take 1 tablet (80 mg total) by mouth at bedtime. 30 tablet 3  . canagliflozin (INVOKANA) 300 MG TABS tablet Take 300 mg by mouth.    . Cholecalciferol (VITAMIN D3) 5000 UNITS CAPS Take 1,000 Units by mouth.     . clopidogrel (PLAVIX) 75 MG tablet Take 1 tablet (75 mg total) by mouth daily. 30 tablet 4  . furosemide (LASIX) 20 MG tablet Take 20 mg as needed by mouth.     Marland Kitchen  glyBURIDE (DIABETA) 5 MG tablet Take 5 mg by mouth 2 (two) times daily with a meal.    . Insulin Glargine (TOUJEO SOLOSTAR [place]) Inject 40 Units into the skin.    Marland Kitchen lisinopril (PRINIVIL,ZESTRIL) 20 MG tablet Take 20 mg by mouth daily.    . meclizine (ANTIVERT) 12.5 MG tablet Take 12.5 mg by mouth 3 (three) times daily as needed for dizziness.    . metFORMIN (GLUCOPHAGE) 1000 MG tablet Take 1,000 mg by mouth 2 (two) times daily with a meal.    . vitamin B-12 (CYANOCOBALAMIN) 1000 MCG tablet Take by mouth.     No current facility-administered medications on file prior to visit.     Allergies:   Allergies  Allergen Reactions  . Influenza Vaccines Swelling    Arm was swollen in 2002   . Sitagliptin Swelling  . Guaifenesin Other (See Comments)    Unknown  . Latex Other (See Comments)    Unknown  . Other Other (See Comments)    Band-Aid  . Phenylephrine-Pheniramine-Dm Other (See Comments)    Unknown  . Ultram [Tramadol Hcl] Other (See Comments)    hallucinations  . Escitalopram Oxalate Other (See Comments)    Hallucinations     Physical Exam General: well developed, pleasant elderly Caucasian female, well nourished, seated, in no evident distress Head: head normocephalic and atraumatic.  Neck: supple with no carotid or supraclavicular bruits Cardiovascular: regular rate and rhythm, no murmurs Musculoskeletal: no deformity Skin:  no rash/petichiae Vascular:  Normal pulses all extremities Vitals:   06/01/17 1526  BP: (!) 145/87  Pulse: 95   Neurologic Exam Mental Status: Awake and fully alert. Oriented to place and time. Recent and remote memory intact. Attention span, concentration and fund of knowledge appropriate. Mood and affect appropriate.  Cranial Nerves: Fundoscopic exam reveals sharp disc margins. Pupils equal, briskly reactive to light. Extraocular movements full without nystagmus. Visual fields showed dense right homonymous hemianopsia to confrontation. Hearing intact. Facial sensation intact. Face, tongue, palate moves normally and symmetrically.  Motor: Normal bulk and tone. Normal strength in all tested extremity muscles.  Diminished fine finger movements on the right.  Orbits left to right upper extremity.  Mild right grip weakness. Sensory.: intact to touch ,pinprick .position and vibratory sensation.  Coordination: Rapid alternating movements normal in all extremities. Finger-to-nose and heel-to-shin performed accurately bilaterally. Gait and Station: Arises from chair without difficulty. Stance is normal. Gait demonstrates normal stride length and balance . Able to heel, toe and tandem walk with slight difficulty.  Reflexes: 1+ and symmetric. Toes downgoing.   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  She is made good functional recovery except for persistent right homonymous hemianopsia.  Vascular risk factors of hypertension, hyperlipidemia , diabetes and intracranial  atherosclerosis.  Patient returns for follow-up visit and recent TIA on 03/23/2017.  Patient continues to participate in atrial fibrillation trial.    PLAN: -Continue aspirin 325, Plavix 75 and Lipitor for secondary stroke prevention -f/u PCP for HLD, DM and HTN management -PCP to continue to prescribe medications -Okay to take Tylenol for occasional headaches -will continue to monitor BP at home -Maintain strict control of hypertension with blood pressure goal below 130/90, diabetes with hemoglobin A1c goal below 6.5% and cholesterol with LDL cholesterol (bad cholesterol) goal below 70 mg/dL. I also advised the patient to eat a healthy diet with plenty  of whole grains, cereals, fruits and vegetables, exercise regularly and maintain ideal body weight.  Followup in the future with me in 6 months or call earlier if needed  Greater than 50% time during this 25 minute consultation visit was spent on counseling and coordination of care about HLD, HTN and DM (risk factors), discussion about risk benefit of anticoagulation and answering questions.   George Hugh, AGNP-BC  [facility] [address] [place], [place] [address]  Phone [phone] Fax [phone]

## 2017-06-05 NOTE — Progress Notes (Signed)
I agree with the above plan 

## 2017-06-12 ENCOUNTER — Other Ambulatory Visit: Payer: Self-pay

## 2017-06-12 ENCOUNTER — Emergency Department (HOSPITAL_COMMUNITY): Payer: Medicare Other

## 2017-06-12 ENCOUNTER — Encounter (HOSPITAL_COMMUNITY): Payer: Self-pay | Admitting: Pharmacy Technician

## 2017-06-12 ENCOUNTER — Observation Stay (HOSPITAL_COMMUNITY)
Admission: EM | Admit: 2017-06-12 | Discharge: 2017-06-13 | Disposition: A | Discharge status: Home / Self Care | Payer: Medicare Other | Attending: Family Medicine | Admitting: Family Medicine

## 2017-06-12 DIAGNOSIS — Z794 Long term (current) use of insulin: Secondary | ICD-10-CM | POA: Insufficient documentation

## 2017-06-12 DIAGNOSIS — R51 Headache: Secondary | ICD-10-CM | POA: Diagnosis present

## 2017-06-12 DIAGNOSIS — Z8669 Personal history of other diseases of the nervous system and sense organs: Secondary | ICD-10-CM | POA: Insufficient documentation

## 2017-06-12 DIAGNOSIS — R519 Headache, unspecified: Secondary | ICD-10-CM

## 2017-06-12 DIAGNOSIS — R4781 Slurred speech: Secondary | ICD-10-CM | POA: Insufficient documentation

## 2017-06-12 DIAGNOSIS — E119 Type 2 diabetes mellitus without complications: Secondary | ICD-10-CM | POA: Diagnosis not present

## 2017-06-12 DIAGNOSIS — Z79899 Other long term (current) drug therapy: Secondary | ICD-10-CM | POA: Diagnosis not present

## 2017-06-12 DIAGNOSIS — Z7982 Long term (current) use of aspirin: Secondary | ICD-10-CM | POA: Insufficient documentation

## 2017-06-12 DIAGNOSIS — I10 Essential (primary) hypertension: Secondary | ICD-10-CM | POA: Diagnosis not present

## 2017-06-12 DIAGNOSIS — E118 Type 2 diabetes mellitus with unspecified complications: Secondary | ICD-10-CM | POA: Diagnosis present

## 2017-06-12 DIAGNOSIS — Z8673 Personal history of transient ischemic attack (TIA), and cerebral infarction without residual deficits: Secondary | ICD-10-CM | POA: Insufficient documentation

## 2017-06-12 DIAGNOSIS — E785 Hyperlipidemia, unspecified: Secondary | ICD-10-CM | POA: Diagnosis not present

## 2017-06-12 DIAGNOSIS — G459 Transient cerebral ischemic attack, unspecified: Secondary | ICD-10-CM | POA: Diagnosis present

## 2017-06-12 DIAGNOSIS — Z7902 Long term (current) use of antithrombotics/antiplatelets: Secondary | ICD-10-CM | POA: Diagnosis not present

## 2017-06-12 LAB — COMPREHENSIVE METABOLIC PANEL
ALT: 24 U/L (ref 14–54)
ANION GAP: 14 (ref 5–15)
AST: 21 U/L (ref 15–41)
Albumin: 3.5 g/dL (ref 3.5–5.0)
Alkaline Phosphatase: 69 U/L (ref 38–126)
BUN: 13 mg/dL (ref 6–20)
CO2: 19 mmol/L — AB (ref 22–32)
Calcium: 9.7 mg/dL (ref 8.9–10.3)
Chloride: 107 mmol/L (ref 101–111)
Creatinine, Ser: 0.7 mg/dL (ref 0.44–1.00)
GFR calc non Af Amer: >60 mL/min (ref >60)
Glucose, Bld: 125 mg/dL — ABNORMAL HIGH (ref 65–99)
POTASSIUM: 3.9 mmol/L (ref 3.5–5.1)
SODIUM: 140 mmol/L (ref 135–145)
TOTAL PROTEIN: 7.1 g/dL (ref 6.5–8.1)
Total Bilirubin: 0.5 mg/dL (ref 0.3–1.2)

## 2017-06-12 LAB — CBC WITH DIFFERENTIAL/PLATELET
Abs Immature Granulocytes: 0 10*3/uL (ref 0.0–0.1)
BASOS ABS: 0.1 10*3/uL (ref 0.0–0.1)
BASOS PCT: 1 %
EOS ABS: 0.2 10*3/uL (ref 0.0–0.7)
EOS PCT: 3 %
HCT: 37.7 % (ref 36.0–46.0)
Hemoglobin: 12.3 g/dL (ref 12.0–15.0)
Immature Granulocytes: 0 %
Lymphocytes Relative: 42 %
Lymphs Abs: 3 10*3/uL (ref 0.7–4.0)
MCH: 29.2 pg (ref 26.0–34.0)
MCHC: 32.6 g/dL (ref 30.0–36.0)
MCV: 89.5 fL (ref 78.0–100.0)
MONO ABS: 0.6 10*3/uL (ref 0.1–1.0)
Monocytes Relative: 8 %
Neutro Abs: 3.3 10*3/uL (ref 1.7–7.7)
Neutrophils Relative %: 46 %
Platelets: 199 10*3/uL (ref 150–400)
RBC: 4.21 MIL/uL (ref 3.87–5.11)
RDW: 13 % (ref 11.5–15.5)
WBC: 7.3 10*3/uL (ref 4.0–10.5)

## 2017-06-12 MED ORDER — LORAZEPAM 2 MG/ML IJ SOLN
1.0000 mg | Freq: Once | INTRAMUSCULAR | Status: AC
  Administered 2017-06-13: 1 mg via INTRAVENOUS
  Filled 2017-06-12 (×2): qty 1

## 2017-06-12 MED ORDER — ONDANSETRON HCL 4 MG/2ML IJ SOLN
4.0000 mg | Freq: Once | INTRAMUSCULAR | Status: AC
  Administered 2017-06-12: 4 mg via INTRAVENOUS
  Filled 2017-06-12: qty 2

## 2017-06-12 MED ORDER — FENTANYL CITRATE (PF) 100 MCG/2ML IJ SOLN
50.0000 ug | Freq: Once | INTRAMUSCULAR | Status: AC
  Administered 2017-06-12: 50 ug via INTRAVENOUS
  Filled 2017-06-12: qty 2

## 2017-06-12 MED ORDER — SODIUM CHLORIDE 0.9 % IV BOLUS
500.0000 mL | Freq: Once | INTRAVENOUS | Status: AC
Start: 2017-06-12 — End: 2017-06-13
  Administered 2017-06-12: 500 mL via INTRAVENOUS

## 2017-06-12 NOTE — ED Provider Notes (Addendum)
[facility] EMERGENCY DEPARTMENT Provider Note   CSN: [id number] Arrival date & time: 06/12/17  2119     History   Chief Complaint Chief Complaint  Patient presents with  . Headache    HPI Jill Howard is a 77 y.o. female.  Patient presents with occipital headache earlier today.  Her husband reports that she had trouble on Friday with "word finding".  Past medical history per her daughter includes a stroke on October 2018 and a TIA in March 2019.  She also had a traumatic injury to her occipital area at age 59 which has led to chronic migraine headaches.  No motor or sensory deficits.     Past Medical History:  Diagnosis Date  . Diabetes mellitus without complication (HCC)   . Hypertension   . Kidney stone   . Stroke (HCC)   . Vertigo     Patient Active Problem List   Diagnosis Date Noted  . Neurological symptoms 03/23/2017  . CVA (cerebral vascular accident) (HCC) 10/25/2016  . 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 Essential hypertension 10/24/2016  . Diabetes mellitus with complication (HCC) 10/24/2016  . Chronic pain 10/24/2016    Past Surgical History:  Procedure Laterality Date  . ABDOMINAL HYSTERECTOMY    . KIDNEY STONE SURGERY       OB History   None      Home Medications    Prior to Admission medications   Medication Sig Start Date End Date Taking? Authorizing Provider  amLODipine (NORVASC) 5 MG tablet Take 7.5 mg by mouth daily.    [provider]  aspirin EC 325 MG tablet Take 1 tablet (325 mg total) by mouth daily. 03/29/17   Marvel Plan, MD  atorvastatin (LIPITOR) 80 MG tablet Take 1 tablet (80 mg total) by mouth at bedtime. 10/25/16   Rai, Delene Ruffini, MD  clopidogrel (PLAVIX) 75 MG tablet Take 1 tablet (75 mg total) by mouth daily. 10/26/16   Rai, Delene Ruffini, MD  Dulaglutide (TRULICITY [place]) Inject into the skin.    [provider]  glyBURIDE (DIABETA) 5 MG tablet Take 5 mg by mouth 2 (two) times daily with a  meal.    [provider]  Insulin Glargine (TOUJEO SOLOSTAR [place]) Inject 40 Units into the skin.    [provider]  lisinopril (PRINIVIL,ZESTRIL) 20 MG tablet Take 20 mg by mouth daily. 09/09/16   [provider]  meclizine (ANTIVERT) 12.5 MG tablet Take 12.5 mg by mouth 3 (three) times daily as needed for dizziness. 10/21/16   [provider]  metFORMIN (GLUCOPHAGE) 1000 MG tablet Take 1,000 mg by mouth 2 (two) times daily with a meal.    [provider]  Omega-3 Fatty Acids (FISH OIL) 1000 MG CAPS Take daily by mouth.    [provider]  vitamin B-12 (CYANOCOBALAMIN) 1000 MCG tablet Take by mouth.    [provider]    Family History Family History  Problem Relation Age of Onset  . Stroke Other   . Heart attack Other   . Cancer Other   . Diabetes Other   . Stroke Maternal Aunt   . Stroke Paternal Aunt     Social History Social History   Tobacco Use  . Smoking status: Never Smoker  . Smokeless tobacco: Never Used  Substance Use Topics  . Alcohol use: No  . Drug use: No     Allergies   Influenza vaccines; Sitagliptin; Guaifenesin; Latex; Other; Phenylephrine-pheniramine-dm; Ultram [tramadol hcl];  and Escitalopram oxalate   Review of Systems Review of Systems  All other systems reviewed and are negative.    Physical Exam Updated Vital Signs BP (!) 150/65   Pulse 100   Temp 98.5 F (36.9 C) (Oral)   Resp (!) 22   Ht 5' 3.5" (1.613 m)   Wt 76.7 kg (169 lb)   SpO2 97%   BMI 29.47 kg/m   Physical Exam  Constitutional: She is oriented to person, place, and time. She appears well-developed and well-nourished.  HENT:  Head: Normocephalic and atraumatic.  Eyes: Conjunctivae are normal.  Neck: Neck supple.  Cardiovascular: Normal rate and regular rhythm.  Pulmonary/Chest: Effort normal and breath sounds normal.  Abdominal: Soft. Bowel sounds are normal.  Musculoskeletal: Normal range of motion.    Neurological: She is alert and oriented to person, place, and time.  memory, sensory, cerebellar exam normal.  Skin: Skin is warm and dry.  Psychiatric: She has a normal mood and affect. Her behavior is normal.  Nursing note and vitals reviewed.    ED Treatments / Results  Labs (all labs ordered are listed, but only abnormal results are displayed) Labs Reviewed  COMPREHENSIVE METABOLIC PANEL - Abnormal; Notable for the following components:      Result Value   CO2 19 (*)    Glucose, Bld 125 (*)    All other components within normal limits  CBC WITH DIFFERENTIAL/PLATELET    EKG None  Radiology No results found.  Procedures Procedures (including critical care time)  Medications Ordered in ED Medications  LORazepam (ATIVAN) injection 1 mg (has no administration in time range)  sodium chloride 0.9 % bolus 500 mL (500 mLs Intravenous New Bag/Given 06/12/17 2231)  fentaNYL (SUBLIMAZE) injection 50 mcg (50 mcg Intravenous Given 06/12/17 2231)  ondansetron (ZOFRAN) injection 4 mg (4 mg Intravenous Given 06/12/17 2231)  ondansetron (ZOFRAN) injection 4 mg (4 mg Intravenous Given 06/12/17 2323)     Initial Impression / Assessment and Plan / ED Course  I have reviewed the triage vital signs and the nursing notes.  Pertinent labs & imaging results that were available during my care of the patient were reviewed by me and considered in my medical decision making (see chart for details).     Patient presents with occipital headache and remote history of "word finding" issues.  She has a normal exam in the emergency department.  Patient given IV fentanyl for headache which made her nauseated.  Will obtain CT head/MRI brain, discuss with neuro hospitalist, admit to general medicine.  Final Clinical Impressions(s) / ED Diagnoses   Final diagnoses:  Intractable headache, unspecified chronicity pattern, unspecified headache type    ED Discharge Orders    None       Donnetta Hutching,  MD 06/12/17 2345    Donnetta Hutching, MD 06/13/17 [phone]

## 2017-06-12 NOTE — ED Notes (Signed)
Pt taken to CT.

## 2017-06-12 NOTE — ED Triage Notes (Signed)
PT bib EMS with c/o headach onset today. Sinus pressure according to pt. Also complains of slurred speech, however upon ems arrival pt with clear speech. Pt with hx CVA. Stroke neg with EMS. 160/86, HR 100, CBG 126, 98% RA.

## 2017-06-13 ENCOUNTER — Observation Stay (HOSPITAL_COMMUNITY): Payer: Medicare Other

## 2017-06-13 ENCOUNTER — Encounter (HOSPITAL_COMMUNITY): Payer: Self-pay | Admitting: Internal Medicine

## 2017-06-13 DIAGNOSIS — E118 Type 2 diabetes mellitus with unspecified complications: Secondary | ICD-10-CM | POA: Diagnosis not present

## 2017-06-13 DIAGNOSIS — R51 Headache: Secondary | ICD-10-CM | POA: Diagnosis not present

## 2017-06-13 DIAGNOSIS — I10 Essential (primary) hypertension: Secondary | ICD-10-CM

## 2017-06-13 DIAGNOSIS — Z8673 Personal history of transient ischemic attack (TIA), and cerebral infarction without residual deficits: Secondary | ICD-10-CM

## 2017-06-13 DIAGNOSIS — E785 Hyperlipidemia, unspecified: Secondary | ICD-10-CM | POA: Insufficient documentation

## 2017-06-13 DIAGNOSIS — R519 Headache, unspecified: Secondary | ICD-10-CM | POA: Insufficient documentation

## 2017-06-13 DIAGNOSIS — G459 Transient cerebral ischemic attack, unspecified: Secondary | ICD-10-CM

## 2017-06-13 LAB — COMPREHENSIVE METABOLIC PANEL
ALK PHOS: 63 U/L (ref 38–126)
ALT: 21 U/L (ref 14–54)
AST: 22 U/L (ref 15–41)
Albumin: 3.2 g/dL — ABNORMAL LOW (ref 3.5–5.0)
Anion gap: 11 (ref 5–15)
BUN: 12 mg/dL (ref 6–20)
CALCIUM: 9.3 mg/dL (ref 8.9–10.3)
CO2: 21 mmol/L — ABNORMAL LOW (ref 22–32)
Chloride: 106 mmol/L (ref 101–111)
Creatinine, Ser: 0.71 mg/dL (ref 0.44–1.00)
GLUCOSE: 223 mg/dL — AB (ref 65–99)
POTASSIUM: 4.3 mmol/L (ref 3.5–5.1)
SODIUM: 138 mmol/L (ref 135–145)
Total Bilirubin: 0.7 mg/dL (ref 0.3–1.2)
Total Protein: 7 g/dL (ref 6.5–8.1)

## 2017-06-13 LAB — URINALYSIS, ROUTINE W REFLEX MICROSCOPIC
Bilirubin Urine: NEGATIVE
Glucose, UA: 150 mg/dL — AB
Hgb urine dipstick: NEGATIVE
KETONES UR: NEGATIVE mg/dL
LEUKOCYTES UA: NEGATIVE
Nitrite: NEGATIVE
Protein, ur: NEGATIVE mg/dL
Specific Gravity, Urine: 1.006 (ref 1.005–1.030)
pH: 6 (ref 5.0–8.0)

## 2017-06-13 LAB — CBC
HEMATOCRIT: 35.4 % — AB (ref 36.0–46.0)
HEMOGLOBIN: 11.6 g/dL — AB (ref 12.0–15.0)
MCH: 28.7 pg (ref 26.0–34.0)
MCHC: 32.8 g/dL (ref 30.0–36.0)
MCV: 87.6 fL (ref 78.0–100.0)
Platelets: 186 10*3/uL (ref 150–400)
RBC: 4.04 MIL/uL (ref 3.87–5.11)
RDW: 12.9 % (ref 11.5–15.5)
WBC: 7.2 10*3/uL (ref 4.0–10.5)

## 2017-06-13 LAB — CBG MONITORING, ED
GLUCOSE-CAPILLARY: 204 mg/dL — AB (ref 65–99)
Glucose-Capillary: 152 mg/dL — ABNORMAL HIGH (ref 65–99)
Glucose-Capillary: 161 mg/dL — ABNORMAL HIGH (ref 65–99)

## 2017-06-13 LAB — SEDIMENTATION RATE: SED RATE: 31 mm/h — AB (ref 0–22)

## 2017-06-13 MED ORDER — MECLIZINE HCL 12.5 MG PO TABS
12.5000 mg | ORAL_TABLET | Freq: Three times a day (TID) | ORAL | Status: DC | PRN
  Administered 2017-06-13: 12.5 mg via ORAL
  Filled 2017-06-13: qty 1

## 2017-06-13 MED ORDER — ACETAMINOPHEN 325 MG PO TABS: 650.0000 mg | ORAL_TABLET | ORAL | Status: DC | PRN

## 2017-06-13 MED ORDER — PROMETHAZINE HCL 25 MG/ML IJ SOLN: 12.5000 mg | Freq: Once | INTRAMUSCULAR | Status: DC

## 2017-06-13 MED ORDER — ATORVASTATIN CALCIUM 80 MG PO TABS
80.0000 mg | ORAL_TABLET | Freq: Every day | ORAL | Status: DC
  Filled 2017-06-13: qty 1

## 2017-06-13 MED ORDER — CLOPIDOGREL BISULFATE 75 MG PO TABS
75.0000 mg | ORAL_TABLET | Freq: Every day | ORAL | Status: DC
  Administered 2017-06-13: 75 mg via ORAL
  Filled 2017-06-13: qty 1

## 2017-06-13 MED ORDER — LISINOPRIL 20 MG PO TABS
20.0000 mg | ORAL_TABLET | Freq: Every day | ORAL | Status: DC
  Filled 2017-06-13: qty 1

## 2017-06-13 MED ORDER — ENOXAPARIN SODIUM 40 MG/0.4ML ~~LOC~~ SOLN
40.0000 mg | SUBCUTANEOUS | Status: DC
  Administered 2017-06-13: 40 mg via SUBCUTANEOUS
  Filled 2017-06-13: qty 0.4

## 2017-06-13 MED ORDER — AMLODIPINE BESYLATE 5 MG PO TABS
7.5000 mg | ORAL_TABLET | Freq: Every day | ORAL | Status: DC
  Filled 2017-06-13: qty 1

## 2017-06-13 MED ORDER — STROKE: EARLY STAGES OF RECOVERY BOOK
Freq: Once | Status: DC
  Filled 2017-06-13: qty 1

## 2017-06-13 MED ORDER — ACETAMINOPHEN 650 MG RE SUPP: 650.0000 mg | RECTAL | Status: DC | PRN

## 2017-06-13 MED ORDER — ASPIRIN EC 325 MG PO TBEC
325.0000 mg | DELAYED_RELEASE_TABLET | Freq: Every day | ORAL | Status: DC
  Administered 2017-06-13: 325 mg via ORAL
  Filled 2017-06-13: qty 1

## 2017-06-13 MED ORDER — VITAMIN B-12 1000 MCG PO TABS
1000.0000 ug | ORAL_TABLET | Freq: Every day | ORAL | Status: DC
  Administered 2017-06-13: 1000 ug via ORAL
  Filled 2017-06-13 (×2): qty 1

## 2017-06-13 MED ORDER — ACETAMINOPHEN 160 MG/5ML PO SOLN: 650.0000 mg | ORAL | Status: DC | PRN

## 2017-06-13 MED ORDER — INSULIN GLARGINE 100 UNIT/ML ~~LOC~~ SOLN
30.0000 [IU] | Freq: Every day | SUBCUTANEOUS | Status: DC
  Filled 2017-06-13 (×2): qty 0.3

## 2017-06-13 MED ORDER — INSULIN ASPART 100 UNIT/ML ~~LOC~~ SOLN
0.0000 [IU] | Freq: Three times a day (TID) | SUBCUTANEOUS | Status: DC
  Administered 2017-06-13: 2 [IU] via SUBCUTANEOUS
  Filled 2017-06-13: qty 1

## 2017-06-13 MED ORDER — OMEGA-3-ACID ETHYL ESTERS 1 G PO CAPS
1.0000 g | ORAL_CAPSULE | Freq: Every day | ORAL | Status: DC
  Administered 2017-06-13: 1 g via ORAL
  Filled 2017-06-13: qty 1

## 2017-06-13 NOTE — ED Notes (Signed)
MRI rescheduled; pt unable to lie flat due to NV

## 2017-06-13 NOTE — Evaluation (Signed)
Physical Therapy Evaluation Patient Details Name: Jill Howard MRN: [id number] DOB: [birth year] Today's Date: 06/13/2017   History of Present Illness  Jill Howard is a 77 y.o. female with history of stroke in October 2018, hypertension, diabetes mellitus, hyperlipidemia presents to the ER after patient had started developing headache with difficulty speaking around 8 PM last evening.  Patient states that patient started developing headache and when she checked her blood pressure it was more than 170 systolic at the time she also started developing difficulty bringing out words.  Patient was brought to the ER.  Denies any weakness of the upper or lower extremity any visual symptoms or any difficulty swallowing.MRI revealed chronic left watershed infarcts.  Clinical Impression  Pt admitted with above diagnosis. Pt currently with functional limitations due to the deficits listed below (see PT Problem List). Pt with very little deficits overall but did feel more secure with RW.  She has one she can use at home.  Recommend PT while here and Outpt f/u prn.  Will follow acutely.  Pt will benefit from skilled PT to increase their independence and safety with mobility to allow discharge to the venue listed below.      Follow Up Recommendations Outpatient PT;Supervision - Intermittent    Equipment Recommendations  None recommended by PT    Recommendations for Other Services       Precautions / Restrictions Precautions Precautions: Fall Restrictions Weight Bearing Restrictions: No      Mobility  Bed Mobility Overal bed mobility: Independent                Transfers Overall transfer level: Independent                  Ambulation/Gait Ambulation/Gait assistance: Min guard Ambulation Distance (Feet): 250 Feet Assistive device: Rolling walker (2 wheeled);None Gait Pattern/deviations: Step-through pattern;Decreased stride length   Gait velocity interpretation: 1.31 - 2.62  ft/sec, indicative of limited community ambulator General Gait Details: Pt did well with RW without LOB.  Also walked without RW and did not lose balance just not as sure of her steps per pt.    Stairs            Wheelchair Mobility    Modified Rankin (Stroke Patients Only)       Balance Overall balance assessment: Needs assistance Sitting-balance support: No upper extremity supported;Feet supported Sitting balance-Leahy Scale: Good     Standing balance support: No upper extremity supported;During functional activity Standing balance-Leahy Scale: Fair                               Pertinent Vitals/Pain Pain Assessment: No/denies pain    Home Living Family/patient expects to be discharged to:: Private residence Living Arrangements: Spouse/significant other Available Help at Discharge: Family;Available 24 hours/day Type of Home: House Home Access: Stairs to enter Entrance Stairs-Rails: Can reach both Entrance Stairs-Number of Steps: 6ish steps however only has to do 2 at a time Home Layout: One level Home Equipment: Bedside commode;Cane - single point;Walker - 2 wheels;Tub bench      Prior Function Level of Independence: Independent         Comments: Pt had begun to drive in April as she has recovered well after Oct CVA     Hand Dominance   Dominant Hand: Right    Extremity/Trunk Assessment   Upper Extremity Assessment Upper Extremity Assessment: Defer to OT evaluation    Lower Extremity  Assessment Lower Extremity Assessment: Generalized weakness    Cervical / Trunk Assessment Cervical / Trunk Assessment: Normal  Communication   Communication: Expressive difficulties(word finding at times)  Cognition Arousal/Alertness: Awake/alert Behavior During Therapy: Flat affect Overall Cognitive Status: Within Functional Limits for tasks assessed                                        General Comments General comments (skin  integrity, edema, etc.): Did not elicit any vertigo or dizziness today during eval.  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      Exercises Other Exercises Other Exercises: reviewed x 1 exercise with pt    Assessment/Plan    PT Assessment Patient needs continued PT services  PT Problem List Decreased activity tolerance;Decreased balance;Decreased mobility;Decreased safety awareness;Decreased knowledge of precautions       PT Treatment Interventions DME instruction;Gait training;Functional mobility training;Therapeutic activities;Therapeutic exercise;Stair training;Balance training;Patient/family education    PT Goals (Current goals can be found in the Care Plan section)  Acute Rehab PT Goals Patient Stated Goal: to go home PT Goal Formulation: With patient Time For Goal Achievement: 06/27/17 Potential to Achieve Goals: Good    Frequency Min 4X/week   Barriers to discharge        Co-evaluation               AM-PAC PT "6 Clicks" Daily Activity  Outcome Measure Difficulty turning over in bed (including adjusting bedclothes, sheets and blankets)?: None Difficulty moving from lying on back to sitting on the side of the bed? : None Difficulty sitting down on and standing up from a chair with arms (e.g., wheelchair, bedside commode, etc,.)?: None Help needed moving to and from a bed to chair (including a wheelchair)?: None Help needed walking in hospital room?: A Little Help needed climbing 3-5 steps with a railing? : A Lot 6 Click Score: 21    End of Session Equipment Utilized During Treatment: Gait belt Activity Tolerance: Patient limited by fatigue Patient left: in chair;with call bell/phone within reach;with family/visitor present Nurse Communication: Mobility status PT Visit Diagnosis: Muscle weakness (generalized) (M62.81);Dizziness and giddiness (R42)    Time: 6962-9528 PT Time Calculation (min) (ACUTE ONLY): 17  min   Charges:   PT Evaluation $PT Eval Moderate Complexity: 1 Mod     PT G Codes:        Tynetta Bachmann,PT Acute Rehabilitation [phone] (pager)   Berline Lopes 06/13/2017, 10:50 AM

## 2017-06-13 NOTE — ED Notes (Signed)
Pt returned from MRI °

## 2017-06-13 NOTE — Progress Notes (Signed)
STROKE TEAM PROGRESS NOTE   SUBJECTIVE (INTERVAL HISTORY) Her daughter and husband are at the bedside.  Overall she feels her condition is completely resolved. She stated that yesterday she had a severe headache, involving the whole head, explosion feeling, BP from 120s to 170s.  Denies any visual changes, weakness or numbness, may have some confusion and word finding difficulty at that time.  She pushed the button for left alert and she was sent to [facility] for further evaluation yesterday.  Today, her headache resolved at 2 AM and currently she is back to baseline.   OBJECTIVE Temp:  [98 F (36.7 C)-98.5 F (36.9 C)] 98 F (36.7 C) (05/21 0008) Pulse Rate:  [81-107] 81 (05/21 1200) Cardiac Rhythm: Normal sinus rhythm (05/21 0031) Resp:  [14-22] 17 (05/21 1200) BP: (103-175)/(48-80) 125/56 (05/21 1200) SpO2:  [92 %-100 %] 96 % (05/21 1200) Weight:  [169 lb (76.7 kg)] 169 lb (76.7 kg) (05/20 2125)  Recent Labs  Lab 06/13/17 0227 06/13/17 0805 06/13/17 1114  GLUCAP 204* 152* 161*   Recent Labs  Lab 06/12/17 2227 06/13/17 0229  NA 140 138  K 3.9 4.3  CL 107 106  CO2 19* 21*  GLUCOSE 125* 223*  BUN 13 12  CREATININE 0.70 0.71  CALCIUM 9.7 9.3   Recent Labs  Lab 06/12/17 2227 06/13/17 0229  AST 21 22  ALT 24 21  ALKPHOS 69 63  BILITOT 0.5 0.7  PROT 7.1 7.0  ALBUMIN 3.5 3.2*   Recent Labs  Lab 06/12/17 2227 06/13/17 0229  WBC 7.3 7.2  NEUTROABS 3.3  --   HGB 12.3 11.6*  HCT 37.7 35.4*  MCV 89.5 87.6  PLT 199 186   No results for input(s): CKTOTAL, CKMB, CKMBINDEX, TROPONINI in the last 168 hours. No results for input(s): LABPROT, INR in the last 72 hours. Recent Labs    06/12/17 0423  COLORURINE STRAW*  LABSPEC 1.006  PHURINE 6.0  GLUCOSEU 150*  HGBUR NEGATIVE  BILIRUBINUR NEGATIVE  KETONESUR NEGATIVE  PROTEINUR NEGATIVE  NITRITE NEGATIVE  LEUKOCYTESUR NEGATIVE       Component Value Date/Time   CHOL 116 03/24/2017 0643   TRIG 160 (H) 03/24/2017  0643   HDL 33 (L) 03/24/2017 0643   CHOLHDL 3.5 03/24/2017 0643   VLDL 32 03/24/2017 0643   LDLCALC 51 03/24/2017 0643   Lab Results  Component Value Date   HGBA1C 7.2 (H) 03/24/2017      Component Value Date/Time   LABOPIA NONE DETECTED 10/28/2016 0736   COCAINSCRNUR NONE DETECTED 10/28/2016 0736   LABBENZ NONE DETECTED 10/28/2016 0736   AMPHETMU NONE DETECTED 10/28/2016 0736   THCU NONE DETECTED 10/28/2016 0736   LABBARB NONE DETECTED 10/28/2016 0736    No results for input(s): ETH in the last 168 hours.  I have personally reviewed the radiological images below and agree with the radiology interpretations.  Dg Chest 2 View  Result Date: 06/13/2017 CLINICAL DATA:  Weakness.  Nausea and vomiting. EXAM: CHEST - 2 VIEW COMPARISON:  Radiograph 10/24/2016 FINDINGS: The cardiomediastinal contours are normal. Heart size upper normal. Pulmonary vasculature is normal. No consolidation, pleural effusion, or pneumothorax. No acute osseous abnormalities are seen. IMPRESSION: No acute pulmonary process. Electronically Signed   By: Rubye Oaks M.D.   On: 06/13/2017 01:16   Ct Head Wo Contrast  Result Date: 06/13/2017 CLINICAL DATA:  Headache slurred speech EXAM: CT HEAD WITHOUT CONTRAST TECHNIQUE: Contiguous axial images were obtained from the base of the skull through the vertex  without intravenous contrast. COMPARISON:  MRI 03/23/2017, head CT 03/23/2016 FINDINGS: Brain: No acute territorial infarction, hemorrhage or intracranial mass. Old left posterior temporal and parietooccipital infarct. Atrophy. Stable ventricle size. Vascular: No hyperdense vessels.  Carotid vascular calcification Skull: Normal. Negative for fracture or focal lesion. Some fluid in the inferior left mastoid Sinuses/Orbits: Minimal mucosal thickening in the sinuses. No acute orbital abnormality Other: None IMPRESSION: 1. No CT evidence for acute intracranial abnormality. 2. Remote left posterior cerebral infarct.   Atrophy. Electronically Signed   By: Jasmine Pang M.D.   On: 06/13/2017 00:37   Mr Brain Wo Contrast  Result Date: 06/13/2017 CLINICAL DATA:  Initial evaluation for acute headache, word-finding difficulty. EXAM: MRI HEAD WITHOUT CONTRAST TECHNIQUE: Multiplanar, multiecho pulse sequences of the brain and surrounding structures were obtained without intravenous contrast. COMPARISON:  Prior CT from 06/12/2017 as well as previous MRI from 03/23/2017. FINDINGS: Brain: Generalized age-related cerebral atrophy. Extensive encephalomalacia with gliosis within the left temporal occipital region, consistent with prior left MCA/watershed territory infarction. Small amount of chronic hemosiderin staining. Mild chronic small vessel ischemic changes noted as well. No abnormal foci of restricted diffusion to suggest acute or subacute ischemia. Gray-white matter differentiation maintained. No other areas of chronic infarction. No acute intracranial hemorrhage. No mass lesion, midline shift or mass effect. No hydrocephalus. Mild ex vacuo dilatation of the occipital horn of the left lateral ventricle related to encephalomalacia. No extra-axial fluid collection. Major dural sinuses are patent. Pituitary gland suprasellar region within normal limits. Vascular: Major intracranial vascular flow voids maintained. Skull and upper cervical spine: Craniocervical junction within normal limits. Upper cervical spine unremarkable. Bone marrow signal intensity normal. No scalp soft tissue abnormality. Sinuses/Orbits: Globes and orbital soft tissues demonstrate no acute finding. Patient status post lens extraction bilaterally. Axial myopia noted. Paranasal sinuses are clear. Small bilateral mastoid effusions, of doubtful significance. Inner ear structures normal. Other: None. IMPRESSION: 1. No acute intracranial process. 2. Chronic left MCA/posterior watershed territory infarcts, stable. Electronically Signed   By: Rise Mu M.D.    On: 06/13/2017 04:55    PHYSICAL EXAM  Temp:  [98 F (36.7 C)-98.5 F (36.9 C)] 98 F (36.7 C) (05/21 0008) Pulse Rate:  [81-107] 81 (05/21 1200) Resp:  [14-22] 17 (05/21 1200) BP: (103-175)/(48-80) 125/56 (05/21 1200) SpO2:  [92 %-100 %] 96 % (05/21 1200) Weight:  [169 lb (76.7 kg)] 169 lb (76.7 kg) (05/20 2125)  General - Well nourished, well developed, in no apparent distress.  Ophthalmologic - fundi not visualized due to noncooperation.  Cardiovascular - Regular rate and rhythm with no murmur.  Mental Status -  Level of arousal and orientation to time, place, and person were intact. Language including expression, naming, repetition, comprehension was assessed and found intact. Attention span and concentration were normal. Fund of Knowledge was assessed and was intact.  Cranial Nerves II - XII - II - Visual field intact OU for hand movement, however, for finger count, she has difficulty at right hemi visual field, this is chronic since her last stroke in 10/2016. III, IV, VI - Extraocular movements intact. V - Facial sensation intact bilaterally. VII - Facial movement intact bilaterally. VIII - Hearing & vestibular intact bilaterally. X - Palate elevates symmetrically. XI - Chin turning & shoulder shrug intact bilaterally. XII - Tongue protrusion intact.  Motor Strength - The patient's strength was normal in all extremities and pronator drift was absent.  Bulk was normal and fasciculations were absent.   Motor Tone - Muscle  tone was assessed at the neck and appendages and was normal.  Reflexes - The patient's reflexes were symmetrical in all extremities and she had no pathological reflexes.  Sensory - Light touch, temperature/pinprick were assessed and were symmetrical.    Coordination - The patient had normal movements in the hands and feet with no ataxia or dysmetria.  Tremor was absent.  Gait and Station - deferred.   ASSESSMENT/PLAN [name] is a 77  y.o. female with history of hypertension, diabetes, hyperlipidemia, gastric ulcer, stroke, complicated migraine admitted for headache and word finding difficulty.    Complicated migraine  Resultant back to baseline   MRI no acute infarct, chronic left MCA territory infarcts.    CT head No acute finding  LDL 51 in 03/2017  HgbA1c 7.2 in 03/2017  Lovenox for VTE prophylaxis  aspirin 325 mg daily and clopidogrel 75 mg daily prior to admission, now on aspirin 325 mg daily and clopidogrel 75 mg daily. As pt has been on DAPT almost 3 months now from 03/24/17, will d/c ASA and continue on plavix alone.   Recommend tylenol at home for HA abortive therapy  Ongoing aggressive stroke risk factor management  Therapy recommendations:  Pending   Disposition:  Pending  Hx of HA and recrudescence of old stroke  03/2017 admitted for headache and neck pain followed by left facial numbness, and increased right visual field deficit.  CT head showed old left MCA infarcts, no acute abnormality.  CTA head and neck essentially the same as in 10/2016 no significant change.  MRI negative for acute stroke.  LDL 51 and A1c 7.2.  Resumed DAPT for about 3 months.   Follow up with neurology on 06/01/17  Hx of stroke  admitted in 10/2016 for aphasia, right hand weakness.  MRI showed left posterior MCA infarcts.  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  EF 65-70%.  LDL 81 A1c 7.0.  She was put on dual antiplatelet and Lipitor on discharge.  She was also enrolled to stroke AF trial and randomized to medical treatment arm.   Diabetes  HgbA1c 7.2 in 03/2017 goal < 7.0  Uncontrolled  CBG monitoring  SSI  close PCP follow up  Hypertension Stable  Long term BP goal normotensive  Hyperlipidemia  Home meds: Lipitor 80  LDL 51 in 03/2017, goal < 70  continue Lipitor 80  Continue statin at discharge  Other Stroke Risk Factors  Advanced  age  Migraine   Other Active Problems  Gastric ulcer  Hospital day # 0  Neurology will sign off. Please call with questions. Pt will follow up with stroke clinic NP at [facility] as scheduled. Thanks for the consult.   Marvel Plan, MD PhD Stroke Neurology 06/13/2017 1:20 PM    To contact Stroke Continuity provider, please refer to [email or web address]. After hours, contact General Neurology

## 2017-06-13 NOTE — ED Notes (Signed)
Pt given breakfast Tray, and then will transport to [facility]

## 2017-06-13 NOTE — ED Notes (Signed)
ED TO INPATIENT HANDOFF REPORT  Name/Age/Gender Jill Howard 77 y.o. female  Code Status    Code Status Orders  (From admission, onward)        Start     Ordered   06/13/17 0137  Full code  Continuous     06/13/17 0138    Code Status History    Date Active Date Inactive Code Status Order ID Comments User Context   03/23/2017 1801 03/24/2017 1656 Full Code [id number]  Aline August, MD Inpatient   10/24/2016 1251 10/26/2016 1953 Full Code [id number]  Waldemar Dickens, MD Inpatient      Home/SNF/Other Home  Chief Complaint Headache   Level of Care/Admitting Diagnosis ED Disposition    ED Disposition Condition [place]: [facility] [[id number]]  Level of Care: Telemetry [5]  Diagnosis: TIA (transient ischemic attack) [197588]  Admitting Physician: Rise Patience [phone]  Attending Physician: Rise Patience [[id number]]  PT Class (Do Not Modify): Observation [104]  PT Acc Code (Do Not Modify): Observation [10022]       Medical History Past Medical History:  Diagnosis Date  . Diabetes mellitus without complication ([place])   . Hypertension   . Kidney stone   . Stroke ([place])   . Vertigo     Allergies Allergies  Allergen Reactions  . Influenza Vaccines Swelling    Arm was swollen in 2002   . Sitagliptin Swelling  . Guaifenesin Other (See Comments)    Unknown  . Latex Other (See Comments)    Unknown  . Other Other (See Comments)    Band-Aid  . Phenylephrine-Pheniramine-Dm Other (See Comments)    Unknown  . Ultram [Tramadol Hcl] Other (See Comments)    hallucinations  . Escitalopram Oxalate Other (See Comments)    Hallucinations    IV Location/Drains/Wounds Patient Lines/Drains/Airways Status   Active Line/Drains/Airways    Name:   Placement date:   Placement time:   Site:   Days:   Peripheral IV 03/23/17 Right   03/23/17    0835    -   82   Peripheral IV 06/12/17 Right Forearm   06/12/17    2231    Forearm   1           Labs/Imaging Results for orders placed or performed during the hospital encounter of 06/12/17 (from the past 48 hour(s))  Urinalysis, Routine w reflex microscopic     Status: Abnormal   Collection Time: 06/12/17  4:23 AM  Result Value Ref Range   Color, Urine STRAW (A) YELLOW   APPearance CLEAR CLEAR   Specific Gravity, Urine 1.006 1.005 - 1.030   pH 6.0 5.0 - 8.0   Glucose, UA 150 (A) NEGATIVE mg/dL   Hgb urine dipstick NEGATIVE NEGATIVE   Bilirubin Urine NEGATIVE NEGATIVE   Ketones, ur NEGATIVE NEGATIVE mg/dL   Protein, ur NEGATIVE NEGATIVE mg/dL   Nitrite NEGATIVE NEGATIVE   Leukocytes, UA NEGATIVE NEGATIVE    Comment: Performed at [facility] [address], [place], [place] [address]  CBC with Differential     Status: None   Collection Time: 06/12/17 10:27 PM  Result Value Ref Range   WBC 7.3 4.0 - 10.5 K/uL   RBC 4.21 3.87 - 5.11 MIL/uL   Hemoglobin 12.3 12.0 - 15.0 g/dL   HCT 37.7 36.0 - 46.0 %   MCV 89.5 78.0 - 100.0 fL   MCH 29.2 26.0 - 34.0 pg   MCHC  32.6 30.0 - 36.0 g/dL   RDW 13.0 11.5 - 15.5 %   Platelets 199 150 - 400 K/uL   Neutrophils Relative % 46 %   Neutro Abs 3.3 1.7 - 7.7 K/uL   Lymphocytes Relative 42 %   Lymphs Abs 3.0 0.7 - 4.0 K/uL   Monocytes Relative 8 %   Monocytes Absolute 0.6 0.1 - 1.0 K/uL   Eosinophils Relative 3 %   Eosinophils Absolute 0.2 0.0 - 0.7 K/uL   Basophils Relative 1 %   Basophils Absolute 0.1 0.0 - 0.1 K/uL   Immature Granulocytes 0 %   Abs Immature Granulocytes 0.0 0.0 - 0.1 K/uL    Comment: Performed at [facility] [address] [place], [place] [address]  Comprehensive metabolic panel     Status: Abnormal   Collection Time: 06/12/17 10:27 PM  Result Value Ref Range   Sodium 140 135 - 145 mmol/L   Potassium 3.9 3.5 - 5.1 mmol/L   Chloride 107 101 - 111 mmol/L   CO2 19 (L) 22 - 32 mmol/L   Glucose, Bld 125 (H) 65 - 99 mg/dL   BUN 13 6 - 20 mg/dL   Creatinine, Ser 0.70 0.44 - 1.00 mg/dL    Calcium 9.7 8.9 - 10.3 mg/dL   Total Protein 7.1 6.5 - 8.1 g/dL   Albumin 3.5 3.5 - 5.0 g/dL   AST 21 15 - 41 U/L   ALT 24 14 - 54 U/L   Alkaline Phosphatase 69 38 - 126 U/L   Total Bilirubin 0.5 0.3 - 1.2 mg/dL   GFR calc non Af Amer >60 >60 mL/min   GFR calc Af Amer >60 >60 mL/min    Comment: (NOTE) The eGFR has been calculated using the CKD EPI equation. This calculation has not been validated in all clinical situations. eGFR's persistently <60 mL/min signify possible Chronic Kidney Disease.    Anion gap 14 5 - 15    Comment: Performed at [facility] [address] [place], [place] [address]  CBG monitoring, ED     Status: Abnormal   Collection Time: 06/13/17  2:27 AM  Result Value Ref Range   Glucose-Capillary 204 (H) 65 - 99 mg/dL  Comprehensive metabolic panel     Status: Abnormal   Collection Time: 06/13/17  2:29 AM  Result Value Ref Range   Sodium 138 135 - 145 mmol/L   Potassium 4.3 3.5 - 5.1 mmol/L   Chloride 106 101 - 111 mmol/L   CO2 21 (L) 22 - 32 mmol/L   Glucose, Bld 223 (H) 65 - 99 mg/dL   BUN 12 6 - 20 mg/dL   Creatinine, Ser 0.71 0.44 - 1.00 mg/dL   Calcium 9.3 8.9 - 10.3 mg/dL   Total Protein 7.0 6.5 - 8.1 g/dL   Albumin 3.2 (L) 3.5 - 5.0 g/dL   AST 22 15 - 41 U/L   ALT 21 14 - 54 U/L   Alkaline Phosphatase 63 38 - 126 U/L   Total Bilirubin 0.7 0.3 - 1.2 mg/dL   GFR calc non Af Amer >60 >60 mL/min   GFR calc Af Amer >60 >60 mL/min    Comment: (NOTE) The eGFR has been calculated using the CKD EPI equation. This calculation has not been validated in all clinical situations. eGFR's persistently <60 mL/min signify possible Chronic Kidney Disease.    Anion gap 11 5 - 15    Comment: Performed at [facility]  [address], [place], [place] [address]  CBC     Status: Abnormal   Collection Time: 06/13/17  2:29 AM  Result Value Ref Range   WBC 7.2 4.0 - 10.5 K/uL   RBC 4.04 3.87 - 5.11 MIL/uL   Hemoglobin 11.6 (L) 12.0 - 15.0 g/dL   HCT  35.4 (L) 36.0 - 46.0 %   MCV 87.6 78.0 - 100.0 fL   MCH 28.7 26.0 - 34.0 pg   MCHC 32.8 30.0 - 36.0 g/dL   RDW 12.9 11.5 - 15.5 %   Platelets 186 150 - 400 K/uL    Comment: Performed at [facility], [place] [address] [place], [place] [address]  Sedimentation rate     Status: Abnormal   Collection Time: 06/13/17  2:29 AM  Result Value Ref Range   Sed Rate 31 (H) 0 - 22 mm/hr    Comment: Performed at [facility] [address], [place], [place] [address]   Dg Chest 2 View  Result Date: 06/13/2017 CLINICAL DATA:  Weakness.  Nausea and vomiting. EXAM: CHEST - 2 VIEW COMPARISON:  Radiograph 10/24/2016 FINDINGS: The cardiomediastinal contours are normal. Heart size upper normal. Pulmonary vasculature is normal. No consolidation, pleural effusion, or pneumothorax. No acute osseous abnormalities are seen. IMPRESSION: No acute pulmonary process. Electronically Signed   By: Jeb Levering M.D.   On: 06/13/2017 01:16   Ct Head Wo Contrast  Result Date: 06/13/2017 CLINICAL DATA:  Headache slurred speech EXAM: CT HEAD WITHOUT CONTRAST TECHNIQUE: Contiguous axial images were obtained from the base of the skull through the vertex without intravenous contrast. COMPARISON:  MRI 03/23/2017, head CT 03/23/2016 FINDINGS: Brain: No acute territorial infarction, hemorrhage or intracranial mass. Old left posterior temporal and parietooccipital infarct. Atrophy. Stable ventricle size. Vascular: No hyperdense vessels.  Carotid vascular calcification Skull: Normal. Negative for fracture or focal lesion. Some fluid in the inferior left mastoid Sinuses/Orbits: Minimal mucosal thickening in the sinuses. No acute orbital abnormality Other: None IMPRESSION: 1. No CT evidence for acute intracranial abnormality. 2. Remote left posterior cerebral infarct.  Atrophy. Electronically Signed   By: Donavan Foil M.D.   On: 06/13/2017 00:37   Mr Brain Wo Contrast  Result Date: 06/13/2017 CLINICAL DATA:  Initial  evaluation for acute headache, word-finding difficulty. EXAM: MRI HEAD WITHOUT CONTRAST TECHNIQUE: Multiplanar, multiecho pulse sequences of the brain and surrounding structures were obtained without intravenous contrast. COMPARISON:  Prior CT from 06/12/2017 as well as previous MRI from 03/23/2017. FINDINGS: Brain: Generalized age-related cerebral atrophy. Extensive encephalomalacia with gliosis within the left temporal occipital region, consistent with prior left MCA/watershed territory infarction. Small amount of chronic hemosiderin staining. Mild chronic small vessel ischemic changes noted as well. No abnormal foci of restricted diffusion to suggest acute or subacute ischemia. Gray-white matter differentiation maintained. No other areas of chronic infarction. No acute intracranial hemorrhage. No mass lesion, midline shift or mass effect. No hydrocephalus. Mild ex vacuo dilatation of the occipital horn of the left lateral ventricle related to encephalomalacia. No extra-axial fluid collection. Major dural sinuses are patent. Pituitary gland suprasellar region within normal limits. Vascular: Major intracranial vascular flow voids maintained. Skull and upper cervical spine: Craniocervical junction within normal limits. Upper cervical spine unremarkable. Bone marrow signal intensity normal. No scalp soft tissue abnormality. Sinuses/Orbits: Globes and orbital soft tissues demonstrate no acute finding. Patient status post lens extraction bilaterally. Axial myopia noted. Paranasal sinuses are clear. Small bilateral mastoid effusions, of doubtful significance. Inner ear structures normal. Other:  None. IMPRESSION: 1. No acute intracranial process. 2. Chronic left MCA/posterior watershed territory infarcts, stable. Electronically Signed   By: Jeannine Boga M.D.   On: 06/13/2017 04:55    Pending Labs Unresulted Labs (From admission, onward)   Start     Ordered   06/20/17 0500  Creatinine, serum  (enoxaparin  (LOVENOX)    CrCl >/= 30 ml/min)  Weekly,   R    Comments:  while on enoxaparin therapy    06/13/17 0138      Vitals/Pain Today's Vitals   06/13/17 0100 06/13/17 0200 06/13/17 0224 06/13/17 0300  BP: (!) 155/80 132/65 137/68 131/78  Pulse: (!) 107 91 93 98  Resp:    16  Temp:      TempSrc:      SpO2: 97% 97% 98% 97%  Weight:      Height:      PainSc:        Isolation Precautions No active isolations  Medications Medications  promethazine (PHENERGAN) injection 12.5 mg (0 mg Intravenous Hold 06/13/17 0008)  amLODipine (NORVASC) tablet 7.5 mg (has no administration in time range)  aspirin EC tablet 325 mg (has no administration in time range)  atorvastatin (LIPITOR) tablet 80 mg (80 mg Oral Not Given 06/13/17 0344)  clopidogrel (PLAVIX) tablet 75 mg (has no administration in time range)  insulin glargine (LANTUS) injection 30 Units (30 Units Subcutaneous Not Given 06/13/17 0630)  lisinopril (PRINIVIL,ZESTRIL) tablet 20 mg (has no administration in time range)  meclizine (ANTIVERT) tablet 12.5 mg (has no administration in time range)  omega-3 acid ethyl esters (LOVAZA) capsule 1 g (has no administration in time range)  vitamin B-12 (CYANOCOBALAMIN) tablet 1,000 mcg (has no administration in time range)   stroke: mapping our early stages of recovery book (has no administration in time range)  acetaminophen (TYLENOL) tablet 650 mg (has no administration in time range)    Or  acetaminophen (TYLENOL) solution 650 mg (has no administration in time range)    Or  acetaminophen (TYLENOL) suppository 650 mg (has no administration in time range)  enoxaparin (LOVENOX) injection 40 mg (has no administration in time range)  insulin aspart (novoLOG) injection 0-9 Units (has no administration in time range)  sodium chloride 0.9 % bolus 500 mL (0 mLs Intravenous Stopped 06/13/17 0008)  fentaNYL (SUBLIMAZE) injection 50 mcg (50 mcg Intravenous Given 06/12/17 2231)  ondansetron (ZOFRAN) injection  4 mg (4 mg Intravenous Given 06/12/17 2231)  LORazepam (ATIVAN) injection 1 mg (1 mg Intravenous Given 06/13/17 0259)  ondansetron (ZOFRAN) injection 4 mg (4 mg Intravenous Given 06/12/17 2323)    Mobility walks with person assist

## 2017-06-13 NOTE — ED Notes (Signed)
Pt ambulatory to restroom with steady gait; reports worsening dizziness and nausea with movement

## 2017-06-13 NOTE — ED Notes (Signed)
Pt vomited several times in CT

## 2017-06-13 NOTE — H&P (Signed)
History and Physical    Jill Howard ZOX:[id number] DOB: [birth year] DOA: 06/12/2017  PCP: [name], PA-C  Patient coming from: Home.  Chief Complaint: Headache and difficulty speaking.  HPI: Jill Howard is a 77 y.o. female with history of stroke in October 2018, hypertension, diabetes mellitus, hyperlipidemia presents to the ER after patient had started developing headache with difficulty speaking around 8 PM last evening.  Patient states that patient started developing headache and when she checked her blood pressure it was more than 170 systolic at the time she also started developing difficulty bringing out words.  Patient was brought to the ER.  Denies any weakness of the upper or lower extremity any visual symptoms or any difficulty swallowing.  ED Course: In the ER patient appeared nonfocal except for mild difficulty bringing out words.  Headache is improved.  CT head was unremarkable.  EKG shows normal sinus rhythm.  On-call neurologist was consulted by the ER physician who requested MRI of the brain and to admit for further observation.  Patient was admitted in March 2019 and patient had stroke/TIA work-up done including CT angiogram of the head and neck and 2D echo.  Review of Systems: As per HPI, rest all negative.   Past Medical History:  Diagnosis Date  . Diabetes mellitus without complication ([facility])   . Hypertension   . Kidney stone   . Stroke ([facility])   . Vertigo     Past Surgical History:  Procedure Laterality Date  . ABDOMINAL HYSTERECTOMY    . KIDNEY STONE SURGERY       reports that she has never smoked. She has never used smokeless tobacco. She reports that she does not drink alcohol or use drugs.  Allergies  Allergen Reactions  . Influenza Vaccines Swelling    Arm was swollen in 2002   . Sitagliptin Swelling  . Guaifenesin Other (See Comments)    Unknown  . Latex Other (See Comments)    Unknown  . Other Other (See Comments)    Band-Aid  .  Phenylephrine-Pheniramine-Dm Other (See Comments)    Unknown  . Ultram [Tramadol Hcl] Other (See Comments)    hallucinations  . Escitalopram Oxalate Other (See Comments)    Hallucinations    Family History  Problem Relation Age of Onset  . Stroke Other   . Heart attack Other   . Cancer Other   . Diabetes Other   . Stroke Maternal Aunt   . Stroke Paternal Aunt     Prior to Admission medications   Medication Sig Start Date End Date Taking? Authorizing Provider  amLODipine (NORVASC) 5 MG tablet Take 7.5 mg by mouth daily.   Yes [provider]  aspirin EC 325 MG tablet Take 1 tablet (325 mg total) by mouth daily. 03/29/17  Yes Marvel Plan, MD  atorvastatin (LIPITOR) 80 MG tablet Take 1 tablet (80 mg total) by mouth at bedtime. 10/25/16  Yes Rai, Ripudeep K, MD  clopidogrel (PLAVIX) 75 MG tablet Take 1 tablet (75 mg total) by mouth daily. 10/26/16  Yes Rai, Ripudeep K, MD  Dulaglutide (TRULICITY) 1.5 MG/0.5ML SOPN Inject 1.5 mg into the skin once a week. On Tuesday   Yes [provider]  glyBURIDE (DIABETA) 5 MG tablet Take 5 mg by mouth 2 (two) times daily with a meal.   Yes [provider]  Insulin Glargine (TOUJEO SOLOSTAR [place]) Inject 40 Units into the skin at bedtime.    Yes [provider]  lisinopril (PRINIVIL,ZESTRIL)  20 MG tablet Take 20 mg by mouth daily. 09/09/16  Yes [provider]  meclizine (ANTIVERT) 12.5 MG tablet Take 12.5 mg by mouth 3 (three) times daily as needed for dizziness. 10/21/16  Yes [provider]  metFORMIN (GLUCOPHAGE) 1000 MG tablet Take 1,000 mg by mouth 2 (two) times daily with a meal.   Yes [provider]  Omega-3 Fatty Acids (FISH OIL) 1000 MG CAPS Take 1,000 mg by mouth daily.    Yes [provider]  vitamin B-12 (CYANOCOBALAMIN) 1000 MCG tablet Take 1,000 mcg by mouth daily.    Yes [provider]    Physical Exam: Vitals:   06/12/17 2145 06/12/17 2200 06/12/17 2300  06/13/17 0008  BP: (!) 150/80 (!) 150/65 (!) 155/67   Pulse: 95 100 95   Resp: 17 (!) 22 15   Temp:    98 F (36.7 C)  TempSrc:      SpO2: 98% 97% 97%   Weight:      Height:          Constitutional: Moderately built and nourished. Vitals:   06/12/17 2145 06/12/17 2200 06/12/17 2300 06/13/17 0008  BP: (!) 150/80 (!) 150/65 (!) 155/67   Pulse: 95 100 95   Resp: 17 (!) 22 15   Temp:    98 F (36.7 C)  TempSrc:      SpO2: 98% 97% 97%   Weight:      Height:       Eyes: Anicteric no pallor. ENMT: No discharge from the ears eyes nose or mouth. Neck: No mass palpated no neck rigidity no JVD appreciated. Respiratory: No rhonchi or crepitations. Cardiovascular: S1-S2 heard no murmurs appreciated. Abdomen: Soft nontender bowel sounds present.  No guarding or rigidity. Musculoskeletal: No edema.  No joint effusion. Skin: No rash.  Skin appears warm. Neurologic: Alert awake oriented to time place and person.  Moves all extremities 5 x 5.  No facial asymmetry tongue is midline.  Pupils are equal and reacting to light. Psychiatric: Appears normal.   Labs on Admission: I have personally reviewed following labs and imaging studies  CBC: Recent Labs  Lab 06/12/17 2227  WBC 7.3  NEUTROABS 3.3  HGB 12.3  HCT 37.7  MCV 89.5  PLT 199   Basic Metabolic Panel: Recent Labs  Lab 06/12/17 2227  NA 140  K 3.9  CL 107  CO2 19*  GLUCOSE 125*  BUN 13  CREATININE 0.70  CALCIUM 9.7   GFR: Estimated Creatinine Clearance: 61.2 mL/min (by C-G formula based on SCr of 0.7 mg/dL). Liver Function Tests: Recent Labs  Lab 06/12/17 2227  AST 21  ALT 24  ALKPHOS 69  BILITOT 0.5  PROT 7.1  ALBUMIN 3.5   No results for input(s): LIPASE, AMYLASE in the last 168 hours. No results for input(s): AMMONIA in the last 168 hours. Coagulation Profile: No results for input(s): INR, PROTIME in the last 168 hours. Cardiac Enzymes: No results for input(s): CKTOTAL, CKMB, CKMBINDEX, TROPONINI  in the last 168 hours. BNP (last 3 results) No results for input(s): PROBNP in the last 8760 hours. HbA1C: No results for input(s): HGBA1C in the last 72 hours. CBG: No results for input(s): GLUCAP in the last 168 hours. Lipid Profile: No results for input(s): CHOL, HDL, LDLCALC, TRIG, CHOLHDL, LDLDIRECT in the last 72 hours. Thyroid Function Tests: No results for input(s): TSH, T4TOTAL, FREET4, T3FREE, THYROIDAB in the last 72 hours. Anemia Panel: No results for input(s): VITAMINB12, FOLATE, FERRITIN,  TIBC, IRON, RETICCTPCT in the last 72 hours. Urine analysis:    Component Value Date/Time   COLORURINE YELLOW 10/28/2016 0736   APPEARANCEUR CLOUDY (A) 10/28/2016 0736   LABSPEC 1.007 10/28/2016 0736   PHURINE 5.0 10/28/2016 0736   GLUCOSEU >=500 (A) 10/28/2016 0736   HGBUR SMALL (A) 10/28/2016 0736   BILIRUBINUR NEGATIVE 10/28/2016 0736   KETONESUR NEGATIVE 10/28/2016 0736   PROTEINUR NEGATIVE 10/28/2016 0736   NITRITE NEGATIVE 10/28/2016 0736   LEUKOCYTESUR LARGE (A) 10/28/2016 0736   Sepsis Labs: (procalcitonin:4,lacticidven:4) )No results found for this or any previous visit (from the past 240 hour(s)).   Radiological Exams on Admission: Dg Chest 2 View  Result Date: 06/13/2017 CLINICAL DATA:  Weakness.  Nausea and vomiting. EXAM: CHEST - 2 VIEW COMPARISON:  Radiograph 10/24/2016 FINDINGS: The cardiomediastinal contours are normal. Heart size upper normal. Pulmonary vasculature is normal. No consolidation, pleural effusion, or pneumothorax. No acute osseous abnormalities are seen. IMPRESSION: No acute pulmonary process. Electronically Signed   By: Rubye Oaks M.D.   On: 06/13/2017 01:16   Ct Head Wo Contrast  Result Date: 06/13/2017 CLINICAL DATA:  Headache slurred speech EXAM: CT HEAD WITHOUT CONTRAST TECHNIQUE: Contiguous axial images were obtained from the base of the skull through the vertex without intravenous contrast. COMPARISON:  MRI 03/23/2017, head CT  03/23/2016 FINDINGS: Brain: No acute territorial infarction, hemorrhage or intracranial mass. Old left posterior temporal and parietooccipital infarct. Atrophy. Stable ventricle size. Vascular: No hyperdense vessels.  Carotid vascular calcification Skull: Normal. Negative for fracture or focal lesion. Some fluid in the inferior left mastoid Sinuses/Orbits: Minimal mucosal thickening in the sinuses. No acute orbital abnormality Other: None IMPRESSION: 1. No CT evidence for acute intracranial abnormality. 2. Remote left posterior cerebral infarct.  Atrophy. Electronically Signed   By: Jasmine Pang M.D.   On: 06/13/2017 00:37    EKG: Independently reviewed.  Normal sinus rhythm.  Assessment/Plan Principal Problem:   TIA (transient ischemic attack) Active Problems:   Essential hypertension   Diabetes mellitus with complication ([facility])    1. Possible TIA -patient placed on neurochecks.  Patient is on aspirin Plavix and statins which will be continued.  Patient passed swallow.  MRI brain has been ordered.  Since patient had recent stroke work-up including CT angiogram of the head and neck and 2D echo these have been not repeated.  Since patient had headache during the episode we will check sed rate. 2. Hypertension -on amlodipine and lisinopril which will be continued.  Allow for permissive hypertension. 3. Diabetes mellitus type 2 on Lantus.  I have decreased Lantus dose from 40-30 and closely follow CBGs with sliding scale coverage.  Will hold metformin while inpatient.  Patient takes Trulicity once a week. 4. Hyperlipidemia on statins. 5. History of stroke in October 2018.   DVT prophylaxis: Lovenox. Code Status: Full code. Family Communication: Patient's husband. Disposition Plan: Home. Consults called: ER physician discussed with neurologist. Admission status: Observation.   Eduard Clos MD [facility] Pager [phone].  If 7PM-7AM, please contact  night-coverage [email or web address] Password [email or web address]  06/13/2017, 1:39 AM

## 2017-06-13 NOTE — Consult Note (Signed)
Neurology Consultation  Reason for Consult: Word finding difficulty Referring Physician: Dr. Adriana Simas  CC:  Word finding difficulty, headaches  History is obtained from: Patient, chart, family members  HPI: Jill Howard is a 77 y.o. 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 She was in her usual state of health till about Friday, 06/09/2017, when she said that her family noticed that she was having difficulty with words.  She knew what she wanted to say but her words would not come out.  Her speech also sounded a little hard-not slurred but just difficult to comprehend at times. She reports that she has had this headache that has been off and on for many weeks to months that involves the back of her head and she cannot tell me if it has been associated with the neurological symptoms. She also had a couple of episodes of nausea accompanied with vomiting while she has been in the ER. She denies any preceding illnesses, chest pain nausea vomiting prior to this denies any fevers or chills    LKW: Friday, 06/09/2017 tpa given?: no, way outside the window Premorbid modified Rankin scale (mRS): 2  ROS: Review of systems performed and is negative except pertinent positives noted in the HPI.   Past Medical History:  Diagnosis Date  . Diabetes mellitus without complication (HCC)   . Hypertension   . Kidney stone   . Stroke (HCC)   . Vertigo       Family History  Problem Relation Age of Onset  . Stroke Other   . Heart attack Other   . Cancer Other   . Diabetes Other   . Stroke Maternal Aunt   . Stroke Paternal Aunt    Social History:   reports that she has never smoked. She has never used smokeless tobacco. She reports that she does not drink alcohol or use  drugs.   Medications  Current Facility-Administered Medications:  .  LORazepam (ATIVAN) injection 1 mg, 1 mg, Intravenous, Once, Donnetta Hutching, MD .  promethazine [facility]) injection 12.5 mg, 12.5 mg, Intravenous, Once, Donnetta Hutching, MD, Stopped at 06/13/17 0008  Current Outpatient Medications:  .  amLODipine (NORVASC) 5 MG tablet, Take 7.5 mg by mouth daily., Disp: , Rfl:  .  aspirin EC 325 MG tablet, Take 1 tablet (325 mg total) by mouth daily., Disp: 30 tablet, Rfl: 0 .  atorvastatin (LIPITOR) 80 MG tablet, Take 1 tablet (80 mg total) by mouth at bedtime., Disp: 30 tablet, Rfl: 3 .  clopidogrel (PLAVIX) 75 MG tablet, Take 1 tablet (75 mg total) by mouth daily., Disp: 30 tablet, Rfl: 4 .  Dulaglutide (TRULICITY) 1.5 MG/0.5ML SOPN, Inject 1.5 mg into the skin once a week. On Tuesday, Disp: , Rfl:  .  glyBURIDE (DIABETA) 5 MG tablet, Take 5 mg by mouth 2 (two) times daily with a meal., Disp: , Rfl:  .  Insulin Glargine (TOUJEO SOLOSTAR [place]), Inject 40 Units into the skin at bedtime. , Disp: , Rfl:  .  lisinopril (PRINIVIL,ZESTRIL) 20 MG tablet, Take 20 mg by mouth daily., Disp: , Rfl:  .  meclizine (ANTIVERT) 12.5 MG tablet, Take 12.5 mg by mouth 3 (three) times daily as needed for dizziness., Disp: , Rfl:  .  metFORMIN (GLUCOPHAGE) 1000 MG tablet, Take 1,000 mg by mouth 2 (two) times daily  with a meal., Disp: , Rfl:  .  Omega-3 Fatty Acids (FISH OIL) 1000 MG CAPS, Take 1,000 mg by mouth daily. , Disp: , Rfl:  .  vitamin B-12 (CYANOCOBALAMIN) 1000 MCG tablet, Take 1,000 mcg by mouth daily. , Disp: , Rfl:   Exam: Current vital signs: BP (!) 155/67   Pulse 95   Temp 98 F (36.7 C)   Resp 15   Ht 5' 3.5" (1.613 m)   Wt 76.7 kg (169 lb)   SpO2 97%   BMI 29.47 kg/m  Vital signs in last 24 hours: Temp:  [98 F (36.7 C)-98.5 F (36.9 C)] 98 F (36.7 C) (05/21 0008) Pulse Rate:  [95-107] 95 (05/20 2300) Resp:  [14-22] 15 (05/20 2300) BP: (150-175)/(65-80) 155/67 (05/20 2300) SpO2:   [97 %-100 %] 97 % (05/20 2300) Weight:  [76.7 kg (169 lb)] 76.7 kg (169 lb) (05/20 2125) General: Awake alert in no apparent distress HEENT: Normocephalic, atraumatic, dry mucous membranes, no thyromegaly Lungs: Clear to auscultation Cardiovascular: S1-S2 heard regular rate rhythm Abdomen: Nondistended nontender Extremities: Warm well perfused with no edema Neurological exam Patient is awake alert oriented x3.  Her speech is mildly dysarthric.  Naming is intact, repetition-she has some difficulty with repetition but is able to say the sentences with mild difficulty, comprehension is intact. Cranial nerves: Pupils equal round reactive to light, extra ocular movements intact, visual fields show a very small field cut corresponding to the left MCA stroke- essentially small quadrantanopsia, facial sensation intact, face symmetric, auditory acuity intact, palate elevates in midline, shoulder shrug intact, tongue midline. Motor exam: Symmetric 5/5 strength all over with no drift Sensory: Intact to light touch with no extinction on double simultaneous stimulation [place]: Intact finger-nose-finger bilaterally Gait testing was deferred at this time NIH stroke scale was 3  Labs I have reviewed labs in epic and the results pertinent to this consultation are:  CBC    Component Value Date/Time   WBC 7.3 06/12/2017 2227   RBC 4.21 06/12/2017 2227   HGB 12.3 06/12/2017 2227   HCT 37.7 06/12/2017 2227   PLT 199 06/12/2017 2227   MCV 89.5 06/12/2017 2227   MCH 29.2 06/12/2017 2227   MCHC 32.6 06/12/2017 2227   RDW 13.0 06/12/2017 2227   LYMPHSABS 3.0 06/12/2017 2227   MONOABS 0.6 06/12/2017 2227   EOSABS 0.2 06/12/2017 2227   BASOSABS 0.1 06/12/2017 2227   CMP     Component Value Date/Time   NA 140 06/12/2017 2227   K 3.9 06/12/2017 2227   CL 107 06/12/2017 2227   CO2 19 (L) 06/12/2017 2227   GLUCOSE 125 (H) 06/12/2017 2227   BUN 13 06/12/2017 2227   CREATININE 0.70 06/12/2017 2227    CALCIUM 9.7 06/12/2017 2227   PROT 7.1 06/12/2017 2227   ALBUMIN 3.5 06/12/2017 2227   AST 21 06/12/2017 2227   ALT 24 06/12/2017 2227   ALKPHOS 69 06/12/2017 2227   BILITOT 0.5 06/12/2017 2227   GFRNONAA >60 06/12/2017 2227   GFRAA >60 06/12/2017 2227   Lipid Panel     Component Value Date/Time   CHOL 116 03/24/2017 0643   TRIG 160 (H) 03/24/2017 0643   HDL 33 (L) 03/24/2017 0643   CHOLHDL 3.5 03/24/2017 0643   VLDL 32 03/24/2017 0643   LDLCALC 51 03/24/2017 0643   Imaging I have reviewed the images obtained: CT-scan of the brain-old left MCA stroke, no new changes MRI from October showed scattered embolic-looking strokes in the left  MCA territory.  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 I would recommend admission for evaluation for a new stroke versus recrudescence of old symptoms. As for her headaches are concerned, I would just recommend symptomatic treatment per primary team for now-she does have a history of migraines and these might be migraines.  Further work-up will be as an outpatient. She might benefit from preventive treatment for the headaches- consider Topamax as outpatient.  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  -- Milon Dikes, MD [facility] Pager: [phone] If 7pm to 7am, please call on call as listed on AMION.

## 2017-06-13 NOTE — ED Notes (Signed)
Neurologist at bedside. 

## 2017-06-13 NOTE — ED Notes (Signed)
Pt taken to xray 

## 2017-06-13 NOTE — ED Notes (Signed)
Requested hospital bed from [facility] 

## 2017-06-13 NOTE — ED Notes (Signed)
Pt taken to MRI  

## 2017-06-13 NOTE — Discharge Summary (Signed)
Physician Discharge Summary  Jill Howard BJY:[id number] DOB: Aug 04, 1940 DOA: 06/12/2017  PCP: [name], PA-C  Admit date: 06/12/2017 Discharge date: 06/13/2017  Admitted From: Home Disposition: Home   Recommendations for Outpatient Follow-up:  1. Follow up with PCP in 1-2 weeks 2. Follow up with neurology as scheduled.  Home Health: None; pt declining outpatient PT Equipment/Devices: None Discharge Condition: Stable CODE STATUS: Full Diet recommendation: Heart healthy, carb-modified  Brief/Interim Summary: Jill Howard is a 77 y.o. female with history of stroke in October 2018, hypertension, diabetes mellitus, hyperlipidemia presents to the ER after patient had started developing headache with difficulty speaking around 8 PM last evening.  Patient states that patient started developing headache and when she checked her blood pressure it was more than 170 systolic at the time she also started developing difficulty bringing out words.  Patient was brought to the ER.  Denies any weakness of the upper or lower extremity any visual symptoms or any difficulty swallowing.  ED Course: In the ER patient appeared nonfocal except for mild difficulty bringing out words.  Headache is improved.  CT head was unremarkable.  EKG shows normal sinus rhythm.  On-call neurologist was consulted by the ER physician who requested MRI of the brain and to admit for further observation.    Hospital course: Symptoms resolved. Patient had been admitted in March 2019 and patient had stroke/TIA work-up done including CT angiogram of the head and neck and 2D echo so these were not repeated. MRI did not show evidence of new ischemia.   Discharge Diagnoses:  Principal Problem:   TIA (transient ischemic attack) Active Problems:   Essential hypertension   Diabetes mellitus with complication (HCC)  Complicated migraine, history of HA, recrdudescence of old stroke: Symptoms resolved, back to mental baseline. No PT  follow up. MRI no acute infarct, chronic left MCA territory infarcts.   - Previously on DAPT, has been ~3 months, so neurology recommended discontinuing ASA and continuing plavix alone.  - Continue statin. LDL 51.   History of complicated migraine: s/p head trauma in the 1960's.  - Recommended to take tylenol at home for abortive therapy, consider prophylactic therapy at follow up.  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 A1c 7.2%  Discharge Instructions Discharge Instructions    Diet - low sodium heart healthy   Complete by:  As directed    Diet Carb Modified   Complete by:  As directed    Discharge instructions   Complete by:  As directed    Neurology has recommended that you stop taking aspirin and continue with plavix alone. Continue taking all other medications as you were.  Follow up with neurology as directed, or seek medical attention sooner if you notice worsening of symptoms.   Increase activity slowly   Complete by:  As directed      Allergies as of 06/13/2017      Reactions   Influenza Vaccines Swelling   Arm was swollen in 2002    Sitagliptin Swelling   Guaifenesin Other (See Comments)   Unknown   Latex Other (See Comments)   Unknown   Other Other (See Comments)   Band-Aid   Phenylephrine-pheniramine-dm Other (See Comments)   Unknown   Ultram [tramadol Hcl] Other (See Comments)   hallucinations   Escitalopram Oxalate Other (See Comments)   Hallucinations      Medication List    STOP taking these medications   aspirin EC 325 MG tablet  TAKE these medications   amLODipine 5 MG tablet Commonly known as:  NORVASC Take 7.5 mg by mouth daily.   atorvastatin 80 MG tablet Commonly known as:  LIPITOR Take 1 tablet (80 mg total) by mouth at bedtime.   clopidogrel 75 MG tablet Commonly known as:  PLAVIX Take 1 tablet (75 mg total) by mouth daily.   Fish Oil 1000 MG Caps Take 1,000 mg by mouth daily.   glyBURIDE  5 MG tablet Commonly known as:  DIABETA Take 5 mg by mouth 2 (two) times daily with a meal.   lisinopril 20 MG tablet Commonly known as:  PRINIVIL,ZESTRIL Take 20 mg by mouth daily.   meclizine 12.5 MG tablet Commonly known as:  ANTIVERT Take 12.5 mg by mouth 3 (three) times daily as needed for dizziness.   metFORMIN 1000 MG tablet Commonly known as:  GLUCOPHAGE Take 1,000 mg by mouth 2 (two) times daily with a meal.   TOUJEO SOLOSTAR [place] Inject 40 Units into the skin at bedtime.   TRULICITY 1.5 MG/0.5ML Sopn Generic drug:  Dulaglutide Inject 1.5 mg into the skin once a week. On Tuesday   vitamin B-12 1000 MCG tablet Commonly known as:  CYANOCOBALAMIN Take 1,000 mcg by mouth daily.      Follow-up Information    George Hugh, NP. Go on 12/04/2017.   Specialty:  Nurse Practitioner Contact information: [address] [place] [address] [phone]        [name], PA-C. Schedule an appointment as soon as possible for a visit in 2 week(s).   Specialty:  Internal Medicine Contact information: [address] [place] [address] [phone]          Allergies  Allergen Reactions  . Influenza Vaccines Swelling    Arm was swollen in 2002   . Sitagliptin Swelling  . Guaifenesin Other (See Comments)    Unknown  . Latex Other (See Comments)    Unknown  . Other Other (See Comments)    Band-Aid  . Phenylephrine-Pheniramine-Dm Other (See Comments)    Unknown  . Ultram [Tramadol Hcl] Other (See Comments)    hallucinations  . Escitalopram Oxalate Other (See Comments)    Hallucinations    Consultations:  Stroke neurology, Dr. Roda Shutters  Procedures/Studies: Dg Chest 2 View  Result Date: 06/13/2017 CLINICAL DATA:  Weakness.  Nausea and vomiting. EXAM: CHEST - 2 VIEW COMPARISON:  Radiograph 10/24/2016 FINDINGS: The cardiomediastinal contours are normal. Heart size upper normal. Pulmonary vasculature is normal. No consolidation, pleural effusion, or  pneumothorax. No acute osseous abnormalities are seen. IMPRESSION: No acute pulmonary process. Electronically Signed   By: Rubye Oaks M.D.   On: 06/13/2017 01:16   Ct Head Wo Contrast  Result Date: 06/13/2017 CLINICAL DATA:  Headache slurred speech EXAM: CT HEAD WITHOUT CONTRAST TECHNIQUE: Contiguous axial images were obtained from the base of the skull through the vertex without intravenous contrast. COMPARISON:  MRI 03/23/2017, head CT 03/23/2016 FINDINGS: Brain: No acute territorial infarction, hemorrhage or intracranial mass. Old left posterior temporal and parietooccipital infarct. Atrophy. Stable ventricle size. Vascular: No hyperdense vessels.  Carotid vascular calcification Skull: Normal. Negative for fracture or focal lesion. Some fluid in the inferior left mastoid Sinuses/Orbits: Minimal mucosal thickening in the sinuses. No acute orbital abnormality Other: None IMPRESSION: 1. No CT evidence for acute intracranial abnormality. 2. Remote left posterior cerebral infarct.  Atrophy. Electronically Signed   By: Jasmine Pang M.D.   On: 06/13/2017 00:37   Mr Brain  Wo Contrast  Result Date: 06/13/2017 CLINICAL DATA:  Initial evaluation for acute headache, word-finding difficulty. EXAM: MRI HEAD WITHOUT CONTRAST TECHNIQUE: Multiplanar, multiecho pulse sequences of the brain and surrounding structures were obtained without intravenous contrast. COMPARISON:  Prior CT from 06/12/2017 as well as previous MRI from 03/23/2017. FINDINGS: Brain: Generalized age-related cerebral atrophy. Extensive encephalomalacia with gliosis within the left temporal occipital region, consistent with prior left MCA/watershed territory infarction. Small amount of chronic hemosiderin staining. Mild chronic small vessel ischemic changes noted as well. No abnormal foci of restricted diffusion to suggest acute or subacute ischemia. Gray-white matter differentiation maintained. No other areas of chronic infarction. No acute  intracranial hemorrhage. No mass lesion, midline shift or mass effect. No hydrocephalus. Mild ex vacuo dilatation of the occipital horn of the left lateral ventricle related to encephalomalacia. No extra-axial fluid collection. Major dural sinuses are patent. Pituitary gland suprasellar region within normal limits. Vascular: Major intracranial vascular flow voids maintained. Skull and upper cervical spine: Craniocervical junction within normal limits. Upper cervical spine unremarkable. Bone marrow signal intensity normal. No scalp soft tissue abnormality. Sinuses/Orbits: Globes and orbital soft tissues demonstrate no acute finding. Patient status post lens extraction bilaterally. Axial myopia noted. Paranasal sinuses are clear. Small bilateral mastoid effusions, of doubtful significance. Inner ear structures normal. Other: None. IMPRESSION: 1. No acute intracranial process. 2. Chronic left MCA/posterior watershed territory infarcts, stable. Electronically Signed   By: Rise Mu M.D.   On: 06/13/2017 04:55     Subjective: No further word-finding difficulties, no focal or generalized weakness/numbness. Pt and husband state she is at her baseline since late last night.   Discharge Exam: Vitals:   06/13/17 1130 06/13/17 1200  BP:  (!) 125/56  Pulse: 83 81  Resp: 20 17  Temp:    SpO2: 95% 96%   General: Pt is alert, awake, not in acute distress Cardiovascular: RRR, S1/S2 +, no rubs, no gallops Respiratory: CTA bilaterally, no wheezing, no rhonchi Abdominal: Soft, NT, ND, bowel sounds + Extremities: No edema, no cyanosis Neuro: Alert, oriented, fluent speech. Seems to have right visual field cut/neglect. Reportedly stable from Oct 2018.  Labs: BNP (last 3 results) No results for input(s): BNP in the last 8760 hours. Basic Metabolic Panel: Recent Labs  Lab 06/12/17 2227 06/13/17 0229  NA 140 138  K 3.9 4.3  CL 107 106  CO2 19* 21*  GLUCOSE 125* 223*  BUN 13 12  CREATININE  0.70 0.71  CALCIUM 9.7 9.3   Liver Function Tests: Recent Labs  Lab 06/12/17 2227 06/13/17 0229  AST 21 22  ALT 24 21  ALKPHOS 69 63  BILITOT 0.5 0.7  PROT 7.1 7.0  ALBUMIN 3.5 3.2*   No results for input(s): LIPASE, AMYLASE in the last 168 hours. No results for input(s): AMMONIA in the last 168 hours. CBC: Recent Labs  Lab 06/12/17 2227 06/13/17 0229  WBC 7.3 7.2  NEUTROABS 3.3  --   HGB 12.3 11.6*  HCT 37.7 35.4*  MCV 89.5 87.6  PLT 199 186   Cardiac Enzymes: No results for input(s): CKTOTAL, CKMB, CKMBINDEX, TROPONINI in the last 168 hours. BNP: Invalid input(s): POCBNP CBG: Recent Labs  Lab 06/13/17 0227 06/13/17 0805 06/13/17 1114  GLUCAP 204* 152* 161*   D-Dimer No results for input(s): DDIMER in the last 72 hours. Hgb A1c No results for input(s): HGBA1C in the last 72 hours. Lipid Profile No results for input(s): CHOL, HDL, LDLCALC, TRIG, CHOLHDL, LDLDIRECT in the last 72 hours.  Thyroid function studies No results for input(s): TSH, T4TOTAL, T3FREE, THYROIDAB in the last 72 hours.  Invalid input(s): FREET3 Anemia work up No results for input(s): VITAMINB12, FOLATE, FERRITIN, TIBC, IRON, RETICCTPCT in the last 72 hours. Urinalysis    Component Value Date/Time   COLORURINE STRAW (A) 06/12/2017 0423   APPEARANCEUR CLEAR 06/12/2017 0423   LABSPEC 1.006 06/12/2017 0423   PHURINE 6.0 06/12/2017 0423   GLUCOSEU 150 (A) 06/12/2017 0423   HGBUR NEGATIVE 06/12/2017 0423   BILIRUBINUR NEGATIVE 06/12/2017 0423   KETONESUR NEGATIVE 06/12/2017 0423   PROTEINUR NEGATIVE 06/12/2017 0423   NITRITE NEGATIVE 06/12/2017 0423   LEUKOCYTESUR NEGATIVE 06/12/2017 0423    Microbiology No results found for this or any previous visit (from the past 240 hour(s)).  Time coordinating discharge: Approximately 40 minutes  Tyrone Nine, MD  [facility] 06/13/2017, 3:05 PM Pager [phone]

## 2017-11-01 ENCOUNTER — Encounter: Payer: Self-pay | Admitting: *Deleted

## 2017-11-01 DIAGNOSIS — Z006 Encounter for examination for normal comparison and control in clinical research program: Secondary | ICD-10-CM

## 2017-11-01 NOTE — Research (Signed)
STROKE~AF Research study month 12 telephone follow up. Patient had TIA on 06/12/17 but doing well. No changes in medication. She has no residual issues left from the her CVA. Modified rankin Scale 0. She rates her health EQ-5d health states 85%. Next research required telephone follow up will be between 7/mar/20-----2/may/20.

## 2017-12-04 ENCOUNTER — Ambulatory Visit (INDEPENDENT_AMBULATORY_CARE_PROVIDER_SITE_OTHER): Payer: Medicare Other | Admitting: Adult Health

## 2017-12-04 ENCOUNTER — Encounter: Payer: Self-pay | Admitting: Adult Health

## 2017-12-04 VITALS — BP 130/76 | HR 82 | Ht 63.0 in | Wt 173.0 lb

## 2017-12-04 DIAGNOSIS — G8929 Other chronic pain: Secondary | ICD-10-CM

## 2017-12-04 DIAGNOSIS — I10 Essential (primary) hypertension: Secondary | ICD-10-CM | POA: Diagnosis not present

## 2017-12-04 DIAGNOSIS — I63312 Cerebral infarction due to thrombosis of left middle cerebral artery: Secondary | ICD-10-CM | POA: Diagnosis not present

## 2017-12-04 DIAGNOSIS — G459 Transient cerebral ischemic attack, unspecified: Secondary | ICD-10-CM | POA: Diagnosis not present

## 2017-12-04 DIAGNOSIS — E118 Type 2 diabetes mellitus with unspecified complications: Secondary | ICD-10-CM | POA: Diagnosis not present

## 2017-12-04 DIAGNOSIS — R51 Headache: Secondary | ICD-10-CM

## 2017-12-04 DIAGNOSIS — E785 Hyperlipidemia, unspecified: Secondary | ICD-10-CM

## 2017-12-04 NOTE — Patient Instructions (Addendum)
Continue clopidogrel 75 mg daily  and lipitor  for secondary stroke prevention  Continue to follow up with PCP regarding cholesterol, blood pressure and diabetes management   Continue to do mind exercises such as card games, cross word puzzles, suduku, and word search   Let us know if your migraine worsen but for right now, continue to take tylenol as needed along with use of cool cloth   Continue to monitor blood pressure at home  Maintain strict control of hypertension with blood pressure goal below 130/90, diabetes with hemoglobin A1c goal below 6.5% and cholesterol with LDL cholesterol (bad cholesterol) goal below 70 mg/dL. I also advised the patient to eat a healthy diet with plenty of whole grains, cereals, fruits and vegetables, exercise regularly and maintain ideal body weight.  Followup in the future with me in 6 months or call earlier if needed       Thank you for coming to see [place] at [facility]. I hope we have been able to provide you high quality care today.  You may receive a patient satisfaction survey over the next few weeks. We would appreciate your feedback and comments so that we may continue to improve ourselves and the health of our patients.

## 2017-12-04 NOTE — Progress Notes (Signed)
[facility] [address] [place]. [place] [address] [phone]       OFFICE FOLLOW-UP NOTE  Jill. Jill Howard Date of Birth:  [birth year] Medical Record Number:  [id number]   Chief Complaint  Patient presents with  . Follow-up    Stroke follow up room 9 pt alome      HPI: Jill Howard is a 77 year is not lady seen today for the first office follow-up visit following hospital admission for stroke in October 2018.  She is accompanied by her daughter.  History is obtained from them as well as review of electronic medical records and have personally reviewed imaging films.Jill Howard a 77 y.o.femalewas past medical history of diabetes, hypertension, hyperlipidemia, fibromyalgia, ?vascular dementia (secondary to poorly controlled DM) who was in her usual state of health about 2 weeks ago, which is when she started noticing some difficulty using her right hand. She found it hard to use utensils. She had been having a hard time walking at times with leg weakness. These symptoms were off and on and completely resolved in between, hence she did not seek medical attention. She also has had frontal headache ongoing for the past 2 weeks, which is different than her usual migraines.On 10/24/16 morning around all 3 AM, she had extreme difficulty walking, could not support her weight and had word finding difficulty, slurred speech and is what prompted the family to bring her to the emergency room at [place] for evaluation.She was evaluated clinically and Transferred to [facility] for an MRI/MRA brain. MRI of the brain showed strokes in the posterior division of the left MCA territory. The MRA of the brain also showed stenosis versus subocclusive thrombus in the left M1.LKW: at least days ago if not 2 weeks ago. tpa given?: no, outside the window.  MRI scan of the brain showed acute infarct in the posterior division of the left middle cerebral artery with MRA showing high-grade stenosis  of the proximal left middle cerebral artery transthoracic echo showed normal ejection fraction.  LDL cholesterol was 81 mg percent.  Hemoglobin A1c was 7.  Patient had expressive aphasia and right hand weakness.Patient participated in the stroke atrial fibrillation study.  Patient states her speech has improved but only when she is tired she has some expressive difficulties.  Right hand strength is also improving but she has mild diminished fine motor skills and clumsiness.  She plans to start outpatient physical and occupational therapy tomorrow.  She continues to have right-sided peripheral vision loss.  She is a presently states that her fibromyalgia pain seems to be a lot better since his stroke.  Her blood pressure is well controlled and today it is 101/6 3.  She is tolerating Lipitor well without muscle aches and pains.  She is on aspirin and Plavix and tolerating it well but getting bruising easily though no major bleeding.  Her blood sugar is also doing better and fasting sugars range from 120-130 range.  06/01/17 update: Patient is being seen today for follow-up and recent hospital admission with diagnosis of TIA.  Patient admitted on 03/23/2017 with complaints of mild left facial numbness left-sided occipital headache and very mild left-sided neck pain.  Denies difficulty speaking, difficulty with coordination in any new weakness or numbness.  Denies any recent illness or travel.  She does admit to sleeping poorly over the last 3 days.  All imaging negative for acute findings  And patient was diagnosed with TIA likely from small vessel disease versus  cardioembolic.  Patient is currently in atrial fibrillation study and being monitored every 6 months.  As patient was previously taking Plavix, it was recommended to add back aspirin 81 mg.  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  LDL 51 and recommended to continue Lipitor 80 mg.  Patient was discharged home in stable condition.  In between his appointment and hospital discharge, it was recommended by Dr. Roda Shutters to continue Plavix but increase aspirin from 81 to 325.  Since discharge, patient has been doing well.  She has not had any additional TIA or strokelike symptoms.  She has continued to take both aspirin 325 and Plavix 75 with mild bruising but no bleeding.  Continues to take atorvastatin without side effects of myalgias.  Blood pressure today's visit 145/87 and per patient this is elevated as typical BP 136/68.  Patient recently was seen by ophthalmologist was found that she has scar tissue from past cataract surgery in bilateral eyes and they are planning on removing this in August.  She does have complaints of occasional headaches but these are not debilitating and very rare.  Patient does have history of migraines.  She does admit to taking Tylenol PM nightly to help with sleep and this was advised by her PCP per patient.  Patient back to baseline and doing all activities she previously was.  Denies new or worsening stroke/TIA symptoms.  Interval history 12/04/2017: Patient returns today for scheduled follow-up visit.  She was seen in the ED on 06/12/2017 for occipital headache and recent word finding difficulty.  Per notes, normal exam in ED.  She was worked up for possible stroke.  MRI was negative for acute infarct.  CT head was negative for acute infarct.  Patient was continuing on DAPT from recent infarct and recommended to discontinue aspirin and continue Plavix alone.  It was felt as though the symptoms were due to complicated migraine.  LDL 51 and recommended continuation of atorvastatin 80 mg daily.  A1c 7.2 and recommended tight glycemic control with close PCP follow-up.  HTN stable.  Patient discharged in stable  condition.  She states since this time, she overall has been doing well.  She has migraine approximately 1 time per month and is typically triggered by increased blood pressure, fluctuation of glucose levels or not enough sleep.  She will typically lay down and use a cold compress over her eyes which typically will break migraine.  She does continue to have cognitive decline since her stroke in 10/2016 which she feels as though has been stable and has not worsened.  She does experience difficulty with speech reading, trouble remembering, decreased concentration and difficulty with comprehension.  She does report still being able to do these tasks but they usually will take her longer.  She also states intermittent speech difficulty that worsens with anxiety or any change in her motion or with headaches.  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  She continues on Plavix without side effects of bleeding or bruising.  Continues on atorvastatin 80 mg without side effects of myalgias.  Does monitor glucose levels at home which have been stable.  Blood pressure today 130/76 but she does monitor this at home and this morning's reading 106/71.  She continues to care for  her husband at home along with maintaining all ADLs and IADLs.  No further concerns at this time.  Denies new or worsening stroke/TIA symptoms.   ROS:   14 system review of systems is positive for hearing loss, cold intolerance, heat intolerance, and bruise easily and all other systems negative  PMH:  Past Medical History:  Diagnosis Date  . Diabetes mellitus without complication (HCC)   . Hypertension   . Kidney stone   . Stroke (HCC)   . Vertigo     Social History:  Social History   Socioeconomic History  . Marital status: Married    Spouse name: Not on file  . Number of children: Not on file  . Years of education: Not on file  . Highest education level: Not on file  Social Needs  . Financial resource  strain: Not on file  . Food insecurity - worry: Not on file  . Food insecurity - inability: Not on file  . Transportation needs - medical: Not on file  . Transportation needs - non-medical: Not on file  Occupational History  . Not on file  Tobacco Use  . Smoking status: Never Smoker  . Smokeless tobacco: Never Used  Substance and Sexual Activity  . Alcohol use: No  . Drug use: No  . Sexual activity: Not on file  Other Topics Concern  . Not on file  Social History Narrative  . Not on file    Medications:   Current Outpatient Medications on File Prior to Visit  Medication Sig Dispense Refill  . amLODipine (NORVASC) 5 MG tablet Take 5 mg by mouth daily.    Marland Kitchen aspirin 81 MG tablet Take 81 mg by mouth daily.    Marland Kitchen atorvastatin (LIPITOR) 80 MG tablet Take 1 tablet (80 mg total) by mouth at bedtime. 30 tablet 3  . canagliflozin (INVOKANA) 300 MG TABS tablet Take 300 mg by mouth.    . Cholecalciferol (VITAMIN D3) 5000 UNITS CAPS Take 1,000 Units by mouth.     . clopidogrel (PLAVIX) 75 MG tablet Take 1 tablet (75 mg total) by mouth daily. 30 tablet 4  . furosemide (LASIX) 20 MG tablet Take 20 mg as needed by mouth.     . glyBURIDE (DIABETA) 5 MG tablet Take 5 mg by mouth 2 (two) times daily with a meal.    . Insulin Glargine (TOUJEO SOLOSTAR [place]ta) Inject 40 Units into the skin.    Marland Kitchen lisinopril (PRINIVIL,ZESTRIL) 20 MG tablet Take 20 mg by mouth daily.    . meclizine (ANTIVERT) 12.5 MG tablet Take 12.5 mg by mouth 3 (three) times daily as needed for dizziness.    . metFORMIN (GLUCOPHAGE) 1000 MG tablet Take 1,000 mg by mouth 2 (two) times daily with a meal.    . vitamin B-12 (CYANOCOBALAMIN) 1000 MCG tablet Take by mouth.     No current facility-administered medications on file prior to visit.     Allergies:   Allergies  Allergen Reactions  . Influenza Vaccines Swelling    Arm was swollen in 2002   . Sitagliptin Swelling  . Guaifenesin Other (See Comments)    Unknown  . Latex Other  (See Comments)    Unknown  . Other Other (See Comments)    Band-Aid  . Phenylephrine-Pheniramine-Dm Other (See Comments)    Unknown  . Ultram [Tramadol Hcl] Other (See Comments)    hallucinations  . Escitalopram Oxalate Other (See Comments)    Hallucinations    Physical Exam General: well  developed, pleasant elderly Caucasian female, well nourished, seated, in no evident distress Head: head normocephalic and atraumatic.  Neck: supple with no carotid or supraclavicular bruits Cardiovascular: regular rate and rhythm, no murmurs Musculoskeletal: no deformity Skin:  no rash/petichiae Vascular:  Normal pulses all extremities Vitals:   12/04/17 1038  BP: 130/76  Pulse: 82   Neurologic Exam Mental Status: Awake and fully alert. Oriented to place and time. Recent and remote memory intact. Attention span, concentration and fund of knowledge appropriate. Mood and affect appropriate.  Cranial Nerves: Fundoscopic exam deferred. Pupils equal, briskly reactive to light. Extraocular movements full without nystagmus. Visual fields showed dense right homonymous hemianopia. Hearing intact. Facial sensation intact. Face, tongue, palate moves normally and symmetrically.  Motor: Normal bulk and tone. Normal strength in all tested extremity muscles.  Sensory.: intact to touch ,pinprick .position and vibratory sensation.  Coordination: Rapid alternating movements normal in all extremities. Finger-to-nose and heel-to-shin performed accurately bilaterally. Gait and Station: Arises from chair without difficulty. Stance is normal. Gait demonstrates normal stride length and balance . Able to heel, toe and tandem walk with slight difficulty.  Reflexes: 1+ and symmetric. Toes downgoing.     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  She is made good functional recovery except for persistent right homonymous hemianopsia.   Vascular risk factors of hypertension, hyperlipidemia , diabetes and intracranial atherosclerosis.  Patient returns today for follow up and overall has been stable without new or worsening stroke/TIA symptoms. Admission in 05/2017 for complicated migraines.     PLAN: -Continue Plavix 75 and Lipitor for secondary stroke prevention -f/u PCP for HLD, DM and HTN management -continue to do mind exercises such as card games, cross word puzzles, suduku and word search -Okay to take Tylenol for occasional headaches -will continue to monitor BP at home -Maintain strict control of hypertension with blood pressure goal below 130/90, diabetes with hemoglobin A1c goal below 6.5% and cholesterol with LDL cholesterol (bad cholesterol) goal below 70 mg/dL. I also advised the patient to eat a healthy diet with plenty of whole grains, cereals, fruits and vegetables, exercise regularly and maintain ideal body weight.  Followup in the future with me in 6 months or call earlier if needed  Greater than 50% time during this 25 minute consultation visit was spent on counseling and coordination of care about HLD, HTN and DM (risk factors), discussion about risk benefit of anticoagulation and answering questions.   George Hugh, AGNP-BC  [facility] [address] [place], [place] [address]  Phone [phone] Fax [phone]

## 2017-12-09 NOTE — Progress Notes (Signed)
I agree with the above plan 

## 2018-04-30 ENCOUNTER — Telehealth: Payer: Self-pay

## 2018-04-30 ENCOUNTER — Telehealth: Payer: Self-pay | Admitting: *Deleted

## 2018-04-30 ENCOUNTER — Encounter: Payer: Self-pay | Admitting: *Deleted

## 2018-04-30 NOTE — Telephone Encounter (Signed)
Attempted call x2; busy tone

## 2018-04-30 NOTE — Telephone Encounter (Signed)
Spoke with pt and pt agreeable with new appt time of 9:15am on 4/7

## 2018-04-30 NOTE — Telephone Encounter (Signed)
   Cardiac Questionnaire:    Since your last visit or hospitalization:    1. Have you been having new or worsening chest pain? NO   2. Have you been having new or worsening shortness of breath? NO 3. Have you been having new or worsening leg swelling, wt gain, or increase in abdominal girth (pants fitting more tightly)? NO   4. Have you had any passing out spells? NO    *A YES to any of these questions would result in the appointment being kept. *If all the answers to these questions are NO, we should indicate that given the current situation regarding the worldwide coronarvirus pandemic, at the recommendation of the CDC, we are looking to limit gatherings in our waiting area, and thus will reschedule their appointment beyond four weeks from today.   _____________   COVID-19 Pre-Screening Questions:  . Do you currently have a fever? NO . Have you recently travelled on a cruise, internationally, or to [place], [place], [place], [place], [place], or [place], [place] ([employer])? NO . Have you been in contact with someone that is currently pending confirmation of Covid19 testing or has been confirmed to have the Covid19 virus?  NO Are you currently experiencing fatigue or cough?NO          

## 2018-04-30 NOTE — Telephone Encounter (Signed)
LVMTCB

## 2018-04-30 NOTE — Addendum Note (Signed)
Addended by: Recardo Evangelist L on: 04/30/2018 11:12 AM   Modules accepted: Orders

## 2018-05-01 ENCOUNTER — Telehealth (INDEPENDENT_AMBULATORY_CARE_PROVIDER_SITE_OTHER): Payer: Medicare Other | Admitting: Cardiovascular Disease

## 2018-05-01 ENCOUNTER — Telehealth: Payer: Self-pay

## 2018-05-01 DIAGNOSIS — I10 Essential (primary) hypertension: Secondary | ICD-10-CM | POA: Diagnosis not present

## 2018-05-01 DIAGNOSIS — E785 Hyperlipidemia, unspecified: Secondary | ICD-10-CM

## 2018-05-01 DIAGNOSIS — R002 Palpitations: Secondary | ICD-10-CM

## 2018-05-01 NOTE — Progress Notes (Signed)
Virtual Visit via Telephone Note   This visit type was conducted due to national recommendations for restrictions regarding the COVID-19 Pandemic (e.g. social distancing) in an effort to limit this patient's exposure and mitigate transmission in our community.  Due to her co-morbid illnesses, this patient is at least at moderate risk for complications without adequate follow up.  This format is felt to be most appropriate for this patient at this time.  The patient did not have access to video technology/had technical difficulties with video requiring transitioning to audio format only (telephone).  All issues noted in this document were discussed and addressed.  No physical exam could be performed with this format.  Please refer to the patient's chart for her  consent to telehealth for [facility].   Evaluation Performed:  Follow-up visit  Date:  05/01/2018   ID:  Jill Howard, DOB February 21, 1940, MRN [id number]  Patient Location: Home  Provider Location: Home  PCP:  [name], PA-C  Cardiologist: Dr. Nanetta Batty Electrophysiologist:  None   Chief Complaint: Palpitations  History of Present Illness:    Jill Howard is a 78 y.o. female who presents via audio/video conferencing for a telehealth visit today.    [name] is a 78 year old married Caucasian female mother of 2 children, grandmother of 4 grandchildren referred by Hayden Rasmussen, PA-C for cardiovascular valuation because of palpitations.  Her cardiac risk factors are notable for type 2 diabetes, essential hypertension, hyperlipidemia, fibromyalgia and vascular dementia.  She has had a stroke 10/24/2016 and suffers from migraine headaches as well.  She does not smoke.  She is never had a heart attack.  She denies chest pain.  She gets occasional shortness of breath when walking and has occasional lower extremity edema in her feet.  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  She apparently had an  office visit with Hayden Rasmussen 03/12/2018 at which time she was complaining of upper respiratory tract symptoms and was treated with Augmentin.  She was also complaining of palpitations at that time.  AZO patch was placed from 03/19/2020 04/02/2018 which showed heart rates ranging from 55-1 42 averaging 83, sinus rhythm.  She did have short runs of SVT up to 5 beats with isolated supraventricular extra beats and rare ventricular extra beats.  There was no atrial fibrillation noted.  She does admit to drinking 4 cups of coffee a day.  She is not very symptomatic from these palpitations other than she notices them.  The patient does not have symptoms concerning for COVID-19 infection (fever, chills, cough, or new shortness of breath).    Past Medical History:  Diagnosis Date  . Diabetes mellitus without complication (HCC)   . Hypertension   . Kidney stone   . Stroke (HCC) 10/24/1996  . Vertigo    Past Surgical History:  Procedure Laterality Date  . ABDOMINAL HYSTERECTOMY    . KIDNEY STONE SURGERY       No outpatient medications have been marked as taking for the 05/01/18 encounter (Telemedicine) with Runell Gess, MD.     Allergies:   Influenza vaccines; Sitagliptin; Guaifenesin; Latex; Other; Phenylephrine-pheniramine-dm; Ultram [tramadol hcl]; and Escitalopram oxalate   Social History   Tobacco Use  . Smoking status: Former Smoker    Last attempt to quit: 04/30/1982    Years since quitting: 36.0  . Smokeless tobacco: Never Used  Substance Use Topics  . Alcohol use: No  . Drug use: No  Family Hx: The patient's family history includes Cancer in an other family member; Diabetes in an other family member; Heart attack in her brother, mother, and another family member; Stroke in her brother, maternal aunt, mother, paternal aunt, and another family member.  ROS:   Please see the history of present illness.     All other systems reviewed and are negative.   Prior CV studies:    The following studies were reviewed today:  ZIO Patch  Labs/Other Tests and Data Reviewed:    EKG:  No ECG reviewed.  Recent Labs: 06/13/2017: ALT 21; BUN 12; Creatinine, Ser 0.71; Hemoglobin 11.6; Platelets 186; Potassium 4.3; Sodium 138   Recent Lipid Panel Lab Results  Component Value Date/Time   CHOL 116 03/24/2017 06:43 AM   TRIG 160 (H) 03/24/2017 06:43 AM   HDL 33 (L) 03/24/2017 06:43 AM   CHOLHDL 3.5 03/24/2017 06:43 AM   LDLCALC 51 03/24/2017 06:43 AM    Wt Readings from Last 3 Encounters:  12/04/17 173 lb (78.5 kg)  06/12/17 169 lb (76.7 kg)  06/01/17 169 lb 6.4 oz (76.8 kg)     Objective:    Vital Signs:  BP 140/69   Pulse 80    A physical exam was not performed today since this was a virtual phone visit.  ASSESSMENT & PLAN:    1. Palpitations- Jill Howard was referred to me by Hayden Rasmussenonna Judd, PA-C for palpitations.  She did have a CO patch placed which showed sinus rhythm with rates from 55-1 42 averaging 83.  She did have short runs of SVT.  She does admit to drinking 4 cups of coffee a day.  She is on no other stimulants.  A 2D echo performed 03/24/2017 was completely normal.  Thyroid function tests were not in her chart.  I have asked her to reduce dramatically her caffeine intake and will reassess in 3 months. 2. Essential hypertension- blood pressure measured today by the patient at home was 140/69 with a pulse of 80.  She is on amlodipine and lisinopril 3. Hyperlipidemia- recent lipid profile performed 03/24/2017 revealed total cholesterol 116, LDL 51 and HDL of 33 on high-dose atorvastatin.  COVID-19 Education: The signs and symptoms of COVID-19 were discussed with the patient and how to seek care for testing (follow up with PCP or arrange E-visit).  The importance of social distancing was discussed today.  Time:   Today, I have spent 17 minutes with the patient with telehealth technology discussing the above problems.     Medication Adjustments/Labs and Tests  Ordered: Current medicines are reviewed at length with the patient today.  Concerns regarding medicines are outlined above.  Tests Ordered: No orders of the defined types were placed in this encounter.  Medication Changes: No orders of the defined types were placed in this encounter.   Disposition:  Follow up in 3 month(s)  Signed, Nanetta BattyJonathan Rydell Wiegel, MD  05/01/2018 10:02 AM     [facility]

## 2018-05-01 NOTE — Telephone Encounter (Signed)
Patient and/or DPR-approved person aware of AVS instructions and verbalized understanding. 

## 2018-05-01 NOTE — Patient Instructions (Addendum)
Medication Instructions:  Your physician recommends that you continue on your current medications as directed. Please refer to the Current Medication list given to you today.' If you need a refill on your cardiac medications before your next appointment, please call your pharmacy.   Lab work: NONE If you have labs (blood work) drawn today and your tests are completely normal, you will receive your results only by: Marland Kitchen MyChart Message (if you have MyChart) OR . A paper copy in the mail If you have any lab test that is abnormal or we need to change your treatment, we will call you to review the results.  Testing/Procedures: NONE  Follow-Up: At [facility], you and your health needs are our priority.  As part of our continuing mission to provide you with exceptional heart care, we have created designated Provider Care Teams.  These Care Teams include your primary Cardiologist (physician) and Advanced Practice Providers (APPs -  Physician Assistants and Nurse Practitioners) who all work together to provide you with the care you need, when you need it. You will need a follow up appointment in 3 months with Dr. Allyson Sabal. You will be contacted by our office to set up this appointment.  Any Other Special Instructions Will Be Listed Below (If Applicable). Please decrease your daily caffeine intake. This may help decrease your palpitations.

## 2018-05-10 ENCOUNTER — Telehealth: Payer: Self-pay | Admitting: *Deleted

## 2018-05-10 NOTE — Telephone Encounter (Signed)
STROKE~AF Research study month 18 follow up visit completed. Patient doing well no adverse events no changes in medications.  Next research visit window is 04/sep/2020---30/oct/2020.

## 2018-05-31 ENCOUNTER — Telehealth: Payer: Self-pay

## 2018-05-31 NOTE — Telephone Encounter (Signed)
I called pt that her visit will be change to video due to covid 19. I ask if she had a cell phone with camera she stated no. Pt also stated her lap top does not have a camera. Also no one in the house has a phone with a camera on it. I stated message will be sent to [facility] NP on what kind of visit she can have.

## 2018-05-31 NOTE — Telephone Encounter (Signed)
As she has been stable from a stroke standpoint, we can have her follow-up as needed

## 2018-06-04 NOTE — Telephone Encounter (Signed)
I called pt that per JEssica NP she can follow up as needed. I stated her appt will be cancel per Shanda Bumps NP. Pt stated she is not having any issues. Pt knows appt is cancel for tomorrow.

## 2018-06-05 ENCOUNTER — Ambulatory Visit: Payer: Medicare Other | Admitting: Adult Health

## 2018-06-28 ENCOUNTER — Telehealth: Payer: Self-pay | Admitting: Cardiovascular Disease

## 2018-06-28 ENCOUNTER — Telehealth: Payer: Self-pay

## 2018-06-28 NOTE — Telephone Encounter (Signed)
Patient called reporting that her heart rate had been fluttering off and on for the past few weeks. However this past week the frequency has been increasing.Her blood pressure has been elevated this past morning however this afternoon it has come down 124/89. Presented the patient to Azalee Course PA and he asked to have her put on his schedule for tomorrow morning. Patient has a 10:15 am appointment with Azalee Course on 06/29/18. Called patient and informed her of the appointment. Patient has not been around anyone with the Covid - 19 virus. She is not running a fever and she has not traveled outside of the state recently. She verbalized understanding of tomorrows appointment.

## 2018-06-28 NOTE — Telephone Encounter (Signed)
[id number] 

## 2018-06-28 NOTE — Telephone Encounter (Signed)
New Message           Patient is calling in for an appointment, patient is complaining of fast HR and B/P evaluated needs an appt soon. Patient also states she feels bad.Pls call to advise.

## 2018-06-29 ENCOUNTER — Ambulatory Visit (INDEPENDENT_AMBULATORY_CARE_PROVIDER_SITE_OTHER): Payer: Medicare Other | Admitting: Physician Assistant

## 2018-06-29 ENCOUNTER — Other Ambulatory Visit: Payer: Self-pay

## 2018-06-29 VITALS — BP 147/58 | HR 79 | Temp 97.5°F | Ht 63.0 in | Wt 171.2 lb

## 2018-06-29 DIAGNOSIS — E785 Hyperlipidemia, unspecified: Secondary | ICD-10-CM | POA: Diagnosis not present

## 2018-06-29 DIAGNOSIS — R002 Palpitations: Secondary | ICD-10-CM

## 2018-06-29 DIAGNOSIS — E119 Type 2 diabetes mellitus without complications: Secondary | ICD-10-CM

## 2018-06-29 DIAGNOSIS — Z79899 Other long term (current) drug therapy: Secondary | ICD-10-CM | POA: Diagnosis not present

## 2018-06-29 DIAGNOSIS — Z8673 Personal history of transient ischemic attack (TIA), and cerebral infarction without residual deficits: Secondary | ICD-10-CM

## 2018-06-29 DIAGNOSIS — I10 Essential (primary) hypertension: Secondary | ICD-10-CM | POA: Diagnosis not present

## 2018-06-29 MED ORDER — METOPROLOL TARTRATE 25 MG PO TABS: 12.5000 mg | ORAL_TABLET | Freq: Two times a day (BID) | ORAL | 1 refills | Status: DC

## 2018-06-29 NOTE — Patient Instructions (Addendum)
Medication Instructions:   Take your Lisinopril in the Mornings  Take your Amlodipine in the Evenings  Start Metoprolol Tartrate 12.5 mg 2 times a day   If you need a refill on your cardiac medications before your next appointment, please call your pharmacy.   Lab work:  You will need to have labs (blood work) drawn today:  BMET TSH  If you have labs (blood work) drawn today and your tests are completely normal, you will receive your results only by: Marland Kitchen MyChart Message (if you have MyChart) OR . A paper copy in the mail If you have any lab test that is abnormal or we need to change your treatment, we will call you to review the results.  Testing/Procedures:  NONE ordered at this time of appointment  Follow-Up: At [facility], you and your health needs are our priority.  As part of our continuing mission to provide you with exceptional heart care, we have created designated Provider Care Teams.  These Care Teams include your primary Cardiologist (physician) and Advanced Practice Providers (APPs -  Physician Assistants and Nurse Practitioners) who all work together to provide you with the care you need, when you need it. You will need a follow up appointment in 2-4 weeks.  Please call our office 2 months in advance to schedule this appointment.  You may see Nanetta Batty, MD or one of the following Advanced Practice Providers on your designated Care Team:   Corine Shelter, PA-C Judy Pimple, [place] . Marjie Skiff, PA-C  Any Other Special Instructions Will Be Listed Below (If Applicable).

## 2018-06-29 NOTE — Progress Notes (Signed)
Cardiology Office Note    Date:  07/01/2018   ID:  Jill Howard, DOB 1940/11/20, MRN [id number]  PCP:  Dorthula MatasJudd, Donna J, PA-C  Cardiologist:  Dr. Allyson SabalBerry   Chief Complaint  Patient presents with  . Follow-up    seen for Dr. Allyson SabalBerry    History of Present Illness:  Jill Howard is a 78 y.o. female with PMH of hypertension, hyperlipidemia, fibromyalgia, vascular dementia, DM2 and history of CVA in August 2018.  She never had a heart attack in the past. Zio patch placed in February 2020 showed heart rate ranges between 55-1 42 averaging 83, sinus rhythm with short runs of SVT up to 5 beats was isolated supraventricular extra beats.  There was no atrial fibrillation noted.  She admits to drink up to 4 cups of coffee a day, she was instructed to cut back on caffeine.  Patient presents today for cardiology office visit.  For the past 2 weeks, she has been having palpitation almost on a daily basis despite trying to cut back on caffeinated drinks.  She described her palpitation as series of rapid fluttering sensation followed by prolonged pause.  She does have dizziness however her dizziness may not necessarily be related to the onset of the palpitation.  On physical exam, she has trace amount of lower extremity edema.  I plan to move her amlodipine to nighttime and add 12.5 mg twice daily of metoprolol tartrate for rate control therapy.  I will obtain a basic metabolic panel and TSH.  She will need a reassessment in 2 to 4 weeks.   Past Medical History:  Diagnosis Date  . Diabetes mellitus without complication (HCC)   . Hypertension   . Kidney stone   . Stroke (HCC) 10/24/1996  . Vertigo     Past Surgical History:  Procedure Laterality Date  . ABDOMINAL HYSTERECTOMY    . KIDNEY STONE SURGERY      Current Medications: Outpatient Medications Prior to Visit  Medication Sig Dispense Refill  . amLODipine (NORVASC) 5 MG tablet Take 10 mg by mouth daily. Take 1 tablet by mouth in the evenings     . aspirin 325 MG tablet Take 325 mg by mouth daily.    Marland Kitchen. atorvastatin (LIPITOR) 80 MG tablet Take 1 tablet (80 mg total) by mouth at bedtime. 30 tablet 3  . B Complex Vitamins (VITAMIN B COMPLEX PO) Take 1,000 mg by mouth.    . clopidogrel (PLAVIX) 75 MG tablet Take 1 tablet (75 mg total) by mouth daily. 30 tablet 4  . diclofenac sodium (VOLTAREN) 1 % GEL Apply 2 g topically as needed.     . diphenhydramine-acetaminophen (TYLENOL PM) 25-500 MG TABS tablet Take 1 tablet by mouth at bedtime as needed.    . Dulaglutide (TRULICITY) 1.5 MG/0.5ML SOPN Inject 1.5 mg into the skin once a week. On Tuesday    . glyBURIDE (DIABETA) 5 MG tablet TAKE 2 TABLETS BY MOUTH TWICE DAILY    . Insulin Glargine (TOUJEO SOLOSTAR ) Inject 45-50 Units into the skin at bedtime.     Marland Kitchen. lisinopril (PRINIVIL,ZESTRIL) 20 MG tablet Take 20 mg by mouth daily. Take 1 tablet by mouth in the mornings    . Loratadine (CLARITIN PO) Take 1 tablet by mouth daily as needed.     . metFORMIN (GLUCOPHAGE) 1000 MG tablet Take 1,000 mg by mouth 2 (two) times daily with a meal.    . montelukast (SINGULAIR) 10 MG tablet Take 1 tablet by mouth at  bedtime.    . Omega-3 Fatty Acids (FISH OIL) 1000 MG CAPS Take 1,000 mg by mouth daily.     . SUMAtriptan (IMITREX) 50 MG tablet Take 50 mg by mouth as needed.     Marland Kitchen VITAMIN D PO Take 5,000 mg by mouth.    . Dicyclomine HCl (BENTYL PO) Take 1 tablet by mouth as needed.    . meclizine (ANTIVERT) 12.5 MG tablet Take 12.5 mg by mouth 3 (three) times daily as needed for dizziness.    . vitamin B-12 (CYANOCOBALAMIN) 1000 MCG tablet Take 1,000 mcg by mouth daily.      No facility-administered medications prior to visit.      Allergies:   Influenza vaccines; Sitagliptin; Guaifenesin; Latex; Other; Phenylephrine-pheniramine-dm; Ultram [tramadol hcl]; and Escitalopram oxalate   Social History   Socioeconomic History  . Marital status: Married    Spouse name: Not on file  . Number of children: Not  on file  . Years of education: Not on file  . Highest education level: Not on file  Occupational History  . Not on file  Social Needs  . Financial resource strain: Not on file  . Food insecurity:    Worry: Not on file    Inability: Not on file  . Transportation needs:    Medical: Not on file    Non-medical: Not on file  Tobacco Use  . Smoking status: Former Smoker    Last attempt to quit: 04/30/1982    Years since quitting: 36.1  . Smokeless tobacco: Never Used  Substance and Sexual Activity  . Alcohol use: No  . Drug use: No  . Sexual activity: Not on file  Lifestyle  . Physical activity:    Days per week: Not on file    Minutes per session: Not on file  . Stress: Not on file  Relationships  . Social connections:    Talks on phone: Not on file    Gets together: Not on file    Attends religious service: Not on file    Active member of club or organization: Not on file    Attends meetings of clubs or organizations: Not on file    Relationship status: Not on file  Other Topics Concern  . Not on file  Social History Narrative  . Not on file     Family History:  The patient's family history includes Cancer in an other family member; Diabetes in an other family member; Heart attack in her brother, mother, and another family member; Stroke in her brother, maternal aunt, mother, paternal aunt, and another family member.   ROS:   Please see the history of present illness.    ROS All other systems reviewed and are negative.   PHYSICAL EXAM:   VS:  BP (!) 147/58 (BP Location: Left Arm, Patient Position: Sitting, Cuff Size: Large)   Pulse 79   Temp (!) 97.5 F (36.4 C)   Ht 5\' 3"  (1.6 m)   Wt 171 lb 3.2 oz (77.7 kg)   SpO2 99%   BMI 30.33 kg/m    GEN: Well nourished, well developed, in no acute distress  HEENT: normal  Neck: no JVD, carotid bruits, or masses Cardiac: RRR; no murmurs, rubs, or gallops,no edema  Respiratory:  clear to auscultation bilaterally, normal  work of breathing GI: soft, nontender, nondistended, + BS MS: no deformity or atrophy  Skin: warm and dry, no rash Neuro:  Alert and Oriented x 3, Strength and sensation are intact  Psych: euthymic mood, full affect  Wt Readings from Last 3 Encounters:  06/29/18 171 lb 3.2 oz (77.7 kg)  12/04/17 173 lb (78.5 kg)  06/12/17 169 lb (76.7 kg)      Studies/Labs Reviewed:   EKG:  EKG is ordered today.  The ekg ordered today demonstrates NSR without significant ST-T wave changes  Recent Labs: 06/29/2018: BUN 19; Creatinine, Ser 0.71; Potassium 4.7; Sodium 142; TSH 1.530   Lipid Panel    Component Value Date/Time   CHOL 116 03/24/2017 0643   TRIG 160 (H) 03/24/2017 0643   HDL 33 (L) 03/24/2017 0643   CHOLHDL 3.5 03/24/2017 0643   VLDL 32 03/24/2017 0643   LDLCALC 51 03/24/2017 0643    Additional studies/ records that were reviewed today include:   Echo 03/24/2017 LV EF: 60% -   65% Study Conclusions  - Left ventricle: The cavity size was normal. There was mild   concentric hypertrophy. Systolic function was normal. The   estimated ejection fraction was in the range of 60% to 65%. Wall   motion was normal; there were no regional wall motion   abnormalities. Doppler parameters are consistent with abnormal   left ventricular relaxation (grade 1 diastolic dysfunction).   Doppler parameters are consistent with indeterminate ventricular   filling pressure. - Aortic valve: Transvalvular velocity was within the normal range.   There was no stenosis. There was no regurgitation. Valve area   (VTI): 2.06 cm^2. Valve area (Vmax): 1.95 cm^2. Valve area   (Vmean): 1.99 cm^2. - Mitral valve: Transvalvular velocity was within the normal range.   There was no evidence for stenosis. There was trivial   regurgitation. - Right ventricle: The cavity size was normal. Wall thickness was   normal. Systolic function was normal. - Atrial septum: No defect or patent foramen ovale was identified. -  Tricuspid valve: There was trivial regurgitation. - Pulmonary arteries: PA peak pressure: 16 mm Hg (S).    ASSESSMENT:    1. Palpitations   2. Medication management   3. Essential hypertension   4. Hyperlipidemia LDL goal <70   5. Controlled type 2 diabetes mellitus without complication, without long-term current use of insulin (HCC)   6. H/O: CVA (cerebrovascular accident)      PLAN:  In order of problems listed above:  1. Palpitation: Patient has a history of SVT.  She has cut back on her caffeine intake, however palpitation persisted.  EKG today shows sinus rhythm.  She mainly noticed palpitation at night when she is laying down.  I will add metoprolol 12.5 mg twice daily to her medical regimen.  She will take her lisinopril in the morning and amlodipine at night.  Obtain basic metabolic panel and a TSH  2. Hypertension: She is on lisinopril in the morning and amlodipine at night.  I added metoprolol 12.5 mg twice daily to her medical regimen  3. Hyperlipidemia: Continue Lipitor  4. DM 2: On insulin, managed by primary care provider  5. History of CVA: No recurrence.    Medication Adjustments/Labs and Tests Ordered: Current medicines are reviewed at length with the patient today.  Concerns regarding medicines are outlined above.  Medication changes, Labs and Tests ordered today are listed in the Patient Instructions below. Patient Instructions  Medication Instructions:   Take your Lisinopril in the Mornings  Take your Amlodipine in the Evenings  Start Metoprolol Tartrate 12.5 mg 2 times a day   If you need a refill on your cardiac medications before  your next appointment, please call your pharmacy.   Lab work:  You will need to have labs (blood work) drawn today:  BMET TSH  If you have labs (blood work) drawn today and your tests are completely normal, you will receive your results only by: Marland Kitchen MyChart Message (if you have MyChart) OR . A paper copy in the mail If  you have any lab test that is abnormal or we need to change your treatment, we will call you to review the results.  Testing/Procedures:  NONE ordered at this time of appointment  Follow-Up: At [facility], you and your health needs are our priority.  As part of our continuing mission to provide you with exceptional heart care, we have created designated Provider Care Teams.  These Care Teams include your primary Cardiologist (physician) and Advanced Practice Providers (APPs -  Physician Assistants and Nurse Practitioners) who all work together to provide you with the care you need, when you need it. You will need a follow up appointment in 2-4 weeks.  Please call our office 2 months in advance to schedule this appointment.  You may see Nanetta Batty, MD or one of the following Advanced Practice Providers on your designated Care Team:   Corine Shelter, PA-C Judy Pimple, [place] . Marjie Skiff, PA-C  Any Other Special Instructions Will Be Listed Below (If Applicable).       Ramond Dial, [place]  07/01/2018 7:02 PM    [facility] [address] [place], [place], [place]  [address] Phone: [phone]; Fax: [phone]

## 2018-06-29 NOTE — Telephone Encounter (Signed)
Patient has an appointment today

## 2018-06-30 LAB — BASIC METABOLIC PANEL
BUN/Creatinine Ratio: 27 (ref 12–28)
BUN: 19 mg/dL (ref 8–27)
CO2: 22 mmol/L (ref 20–29)
Calcium: 10.2 mg/dL (ref 8.7–10.3)
Chloride: 104 mmol/L (ref 96–106)
Creatinine, Ser: 0.71 mg/dL (ref 0.57–1.00)
GFR calc Af Amer: 96 mL/min/{1.73_m2} (ref >59)
GFR calc non Af Amer: 84 mL/min/{1.73_m2} (ref >59)
Glucose: 133 mg/dL — ABNORMAL HIGH (ref 65–99)
Potassium: 4.7 mmol/L (ref 3.5–5.2)
Sodium: 142 mmol/L (ref 134–144)

## 2018-06-30 LAB — TSH: TSH: 1.53 u[IU]/mL (ref 0.450–4.500)

## 2018-07-01 ENCOUNTER — Encounter: Payer: Self-pay | Admitting: Physician Assistant

## 2018-07-02 NOTE — Addendum Note (Signed)
Addended by: Diana Eves on: 07/02/2018 05:34 PM   Modules accepted: Orders

## 2018-07-03 NOTE — Progress Notes (Signed)
The patient has been notified of the result and verbalized understanding.  All questions (if any) were answered. Jacqulynn Cadet, [place] 07/03/2018 4:39 PM

## 2018-07-11 ENCOUNTER — Encounter: Payer: Self-pay | Admitting: Cardiovascular Disease

## 2018-07-11 ENCOUNTER — Ambulatory Visit (INDEPENDENT_AMBULATORY_CARE_PROVIDER_SITE_OTHER): Payer: Medicare Other | Admitting: Cardiovascular Disease

## 2018-07-11 ENCOUNTER — Other Ambulatory Visit: Payer: Self-pay

## 2018-07-11 DIAGNOSIS — E782 Mixed hyperlipidemia: Secondary | ICD-10-CM | POA: Diagnosis not present

## 2018-07-11 DIAGNOSIS — I10 Essential (primary) hypertension: Secondary | ICD-10-CM

## 2018-07-11 DIAGNOSIS — R002 Palpitations: Secondary | ICD-10-CM | POA: Diagnosis not present

## 2018-07-11 NOTE — Assessment & Plan Note (Signed)
History of symptomatic palpitations with event monitor that showed PACs, short runs of SVT but no evidence of PAF.  She does admit to drinking 4 cups of coffee a day and soft drinks.  Her TSH was normal.  She was put on low-dose beta-blocker which completely resolved her symptoms.  We talked about the importance of limiting her caffeine intake.

## 2018-07-11 NOTE — Progress Notes (Signed)
07/11/2018 Jill Howard   05-Apr-1940  [id number]  Primary Physician Jamesetta Orleans, PA-C Primary Cardiologist: Lorretta Harp MD Lupe Carney, [place]  HPI:  Jill Howard is a 78 y.o.  Jill Howard is a 79 year old married Caucasian female mother of 2 children, grandmother of 4 grandchildren referred by Halford Chessman, PA-C for cardiovascular valuation because of palpitations.  I last saw her in the office 05/01/2018. Her cardiac risk factors are notable for type 2 diabetes, essential hypertension, hyperlipidemia, fibromyalgia and vascular dementia.  She has had a stroke 10/24/2016 and suffers from migraine headaches as well.  She does not smoke.  She is never had a heart attack.  She denies chest pain.  She gets occasional shortness of breath when walking and has occasional lower extremity edema in her feet.  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  She apparently had an office visit with Halford Chessman 03/12/2018 at which time she was complaining of upper respiratory tract symptoms and was treated with Augmentin.  She was also complaining of palpitations at that time.  AZO patch was placed from 03/19/2020 - 04/02/2018 which showed heart rates ranging from 55-1 42 averaging 83, sinus rhythm.  She did have short runs of SVT up to 5 beats with isolated supraventricular extra beats and rare ventricular extra beats.  There was no atrial fibrillation noted.  She does admit to drinking 4 cups of coffee a day.  She is not very symptomatic from these palpitations other than she notices them.  She saw Almyra Deforest , PA-C in the office 07/01/2018 who placed her on low-dose beta-blocker just resulted in complete resolution of her symptoms.  Blood work was drawn that revealed a normal TSH.  We talked about the importance of reducing her caffeine intake as well.  The patient does not have symptoms concerning for COVID-19 infection (fever, chills, cough, or new shortness of breath).   Current  Meds  Medication Sig  . amLODipine (NORVASC) 5 MG tablet Take 10 mg by mouth daily. Take 1 tablet by mouth in the evenings  . aspirin 325 MG tablet Take 325 mg by mouth daily.  Marland Kitchen atorvastatin (LIPITOR) 80 MG tablet Take 1 tablet (80 mg total) by mouth at bedtime.  . B Complex Vitamins (VITAMIN B COMPLEX PO) Take 1,000 mg by mouth.  . clopidogrel (PLAVIX) 75 MG tablet Take 1 tablet (75 mg total) by mouth daily.  . diclofenac sodium (VOLTAREN) 1 % GEL Apply 2 g topically as needed.   . diphenhydramine-acetaminophen (TYLENOL PM) 25-500 MG TABS tablet Take 1 tablet by mouth at bedtime as needed.  . Dulaglutide (TRULICITY) 1.5 YC/1.4GY SOPN Inject 1.5 mg into the skin once a week. On Tuesday  . glyBURIDE (DIABETA) 5 MG tablet TAKE 2 TABLETS BY MOUTH TWICE DAILY  . Insulin Glargine (TOUJEO SOLOSTAR [place]) Inject 45-50 Units into the skin at bedtime.   Marland Kitchen lisinopril (PRINIVIL,ZESTRIL) 20 MG tablet Take 20 mg by mouth daily. Take 1 tablet by mouth in the mornings  . Loratadine (CLARITIN PO) Take 1 tablet by mouth daily as needed.   . metFORMIN (GLUCOPHAGE) 1000 MG tablet Take 1,000 mg by mouth 2 (two) times daily with a meal.  . metoprolol tartrate (LOPRESSOR) 25 MG tablet Take 0.5 tablets (12.5 mg total) by mouth 2 (two) times daily.  . montelukast (SINGULAIR) 10 MG tablet Take 1 tablet by mouth at bedtime.  . Omega-3 Fatty Acids (FISH OIL) 1000 MG CAPS  Take 1,000 mg by mouth daily.   . SUMAtriptan (IMITREX) 50 MG tablet Take 50 mg by mouth as needed.   Marland Kitchen. VITAMIN D PO Take 5,000 mg by mouth.     Allergies  Allergen Reactions  . Influenza Vaccines Swelling    Arm was swollen in 2002   . Sitagliptin Swelling  . Guaifenesin Other (See Comments)    Unknown  . Latex Other (See Comments)    Unknown  . Other Other (See Comments)    Band-Aid  . Phenylephrine-Pheniramine-Dm Other (See Comments)    Unknown  . Ultram [Tramadol Hcl] Other (See Comments)    hallucinations  . Escitalopram Oxalate Other  (See Comments)    Hallucinations    Social History   Socioeconomic History  . Marital status: Married    Spouse name: Not on file  . Number of children: Not on file  . Years of education: Not on file  . Highest education level: Not on file  Occupational History  . Not on file  Social Needs  . Financial resource strain: Not on file  . Food insecurity    Worry: Not on file    Inability: Not on file  . Transportation needs    Medical: Not on file    Non-medical: Not on file  Tobacco Use  . Smoking status: Former Smoker    Quit date: 04/30/1982    Years since quitting: 36.2  . Smokeless tobacco: Never Used  Substance and Sexual Activity  . Alcohol use: No  . Drug use: No  . Sexual activity: Not on file  Lifestyle  . Physical activity    Days per week: Not on file    Minutes per session: Not on file  . Stress: Not on file  Relationships  . Social Musicianconnections    Talks on phone: Not on file    Gets together: Not on file    Attends religious service: Not on file    Active member of club or organization: Not on file    Attends meetings of clubs or organizations: Not on file    Relationship status: Not on file  . Intimate partner violence    Fear of current or ex partner: Not on file    Emotionally abused: Not on file    Physically abused: Not on file    Forced sexual activity: Not on file  Other Topics Concern  . Not on file  Social History Narrative  . Not on file     Review of Systems: General: negative for chills, fever, night sweats or weight changes.  Cardiovascular: negative for chest pain, dyspnea on exertion, edema, orthopnea, palpitations, paroxysmal nocturnal dyspnea or shortness of breath Dermatological: negative for rash Respiratory: negative for cough or wheezing Urologic: negative for hematuria Abdominal: negative for nausea, vomiting, diarrhea, bright red blood per rectum, melena, or hematemesis Neurologic: negative for visual changes, syncope, or  dizziness All other systems reviewed and are otherwise negative except as noted above.    Blood pressure (!) 126/58, pulse 72, temperature 98.4 F (36.9 C), height 5\' 3"  (1.6 m), weight 169 lb (76.7 kg).  General appearance: alert and no distress Neck: no adenopathy, no carotid bruit, no JVD, supple, symmetrical, trachea midline and thyroid not enlarged, symmetric, no tenderness/mass/nodules Lungs: clear to auscultation bilaterally Heart: regular rate and rhythm, S1, S2 normal, no murmur, click, rub or gallop Extremities: extremities normal, atraumatic, no cyanosis or edema Pulses: 2+ and symmetric Skin: Skin color, texture, turgor normal. No  rashes or lesions Neurologic: Alert and oriented X 3, normal strength and tone. Normal symmetric reflexes. Normal coordination and gait  EKG not performed today  ASSESSMENT AND PLAN:   Essential hypertension History of essential hypertension her blood pressure measured today 126/58.  She is on amlodipine and metoprolol.  Hyperlipidemia History of hyperlipidemia on statin therapy lipid profile performed 03/24/2017 revealing total cholesterol of 116, LDL 51 and HDL 33  Palpitations History of symptomatic palpitations with event monitor that showed PACs, short runs of SVT but no evidence of PAF.  She does admit to drinking 4 cups of coffee a day and soft drinks.  Her TSH was normal.  She was put on low-dose beta-blocker which completely resolved her symptoms.  We talked about the importance of limiting her caffeine intake.      Runell GessJonathan J. Larose Batres MD FACP,FACC,FAHA, [facility] 07/11/2018 1:05 PM

## 2018-07-11 NOTE — Assessment & Plan Note (Signed)
History of hyperlipidemia on statin therapy lipid profile performed 03/24/2017 revealing total cholesterol of 116, LDL 51 and HDL 33

## 2018-07-11 NOTE — Assessment & Plan Note (Signed)
History of essential hypertension her blood pressure measured today 126/58.  She is on amlodipine and metoprolol.

## 2018-07-11 NOTE — Patient Instructions (Signed)
Medication Instructions:  Your physician recommends that you continue on your current medications as directed. Please refer to the Current Medication list given to you today.  If you need a refill on your cardiac medications before your next appointment, please call your pharmacy.   Lab work: NONE If you have labs (blood work) drawn today and your tests are completely normal, you will receive your results only by: . MyChart Message (if you have MyChart) OR . A paper copy in the mail If you have any lab test that is abnormal or we need to change your treatment, we will call you to review the results.  Testing/Procedures: NONE  Follow-Up: At [facility], you and your health needs are our priority.  As part of our continuing mission to provide you with exceptional heart care, we have created designated Provider Care Teams.  These Care Teams include your primary Cardiologist (physician) and Advanced Practice Providers (APPs -  Physician Assistants and Nurse Practitioners) who all work together to provide you with the care you need, when you need it. You will need a follow up appointment in 6 months WITH HAO MENG, PA-C AND IN 12 MONTHS WITH DR. BERRY.  Please call our office 2 months in advance to schedule this appointment.   

## 2018-09-28 ENCOUNTER — Encounter: Payer: Self-pay | Admitting: *Deleted

## 2018-09-28 DIAGNOSIS — Z006 Encounter for examination for normal comparison and control in clinical research program: Secondary | ICD-10-CM

## 2018-09-28 NOTE — Research (Signed)
STROKE~AF Research study month 24 telephone visit completed. Patient denies and SAE"s. She had event monitor that show short runs ov SVT and was started on metoprolol 12.5 mg BID. No otthter changes.

## 2018-12-17 ENCOUNTER — Encounter: Payer: Self-pay | Admitting: Physician Assistant

## 2018-12-17 ENCOUNTER — Ambulatory Visit (INDEPENDENT_AMBULATORY_CARE_PROVIDER_SITE_OTHER): Payer: Medicare Other | Admitting: Physician Assistant

## 2018-12-17 ENCOUNTER — Other Ambulatory Visit: Payer: Self-pay

## 2018-12-17 VITALS — BP 120/58 | HR 75 | Temp 96.8°F | Ht 63.0 in | Wt 175.2 lb

## 2018-12-17 DIAGNOSIS — E119 Type 2 diabetes mellitus without complications: Secondary | ICD-10-CM

## 2018-12-17 DIAGNOSIS — R002 Palpitations: Secondary | ICD-10-CM | POA: Diagnosis not present

## 2018-12-17 DIAGNOSIS — I10 Essential (primary) hypertension: Secondary | ICD-10-CM | POA: Diagnosis not present

## 2018-12-17 DIAGNOSIS — E785 Hyperlipidemia, unspecified: Secondary | ICD-10-CM | POA: Diagnosis not present

## 2018-12-17 DIAGNOSIS — Z8673 Personal history of transient ischemic attack (TIA), and cerebral infarction without residual deficits: Secondary | ICD-10-CM

## 2018-12-17 NOTE — Patient Instructions (Signed)
Medication Instructions:   Your physician recommends that you continue on your current medications as directed. Please refer to the Current Medication list given to you today.  *If you need a refill on your cardiac medications before your next appointment, please call your pharmacy*  Lab Work:  NONE ordered at this time of appointment   If you have labs (blood work) drawn today and your tests are completely normal, you will receive your results only by: Marland Kitchen MyChart Message (if you have MyChart) OR . A paper copy in the mail If you have any lab test that is abnormal or we need to change your treatment, we will call you to review the results.  Testing/Procedures:  NONE ordered at this time of appointment   Follow-Up: At [facility], you and your health needs are our priority.  As part of our continuing mission to provide you with exceptional heart care, we have created designated Provider Care Teams.  These Care Teams include your primary Cardiologist (physician) and Advanced Practice Providers (APPs -  Physician Assistants and Nurse Practitioners) who all work together to provide you with the care you need, when you need it.  Your next appointment:   6 month(s)  The format for your next appointment:   Either In Person or Virtual  Provider:   Quay Burow, MD  Other Instructions

## 2018-12-17 NOTE — Progress Notes (Signed)
Cardiology Office Note:    Date:  12/17/2018   ID:  Jill Nuttingarol Ramires, DOB [birth year], MRN [id number]  PCP:  Drosinis, [place] Readeronna J, PA-C  Cardiologist:  Nanetta BattyJonathan Berry, MD  Electrophysiologist:  None   Referring MD: Drosinis, [place] Readeronna J, PA-C   Chief Complaint  Patient presents with  . Follow-up    followup on palpitation for Dr. Allyson SabalBerry    History of Present Illness:    Jill Howard is a 78 y.o. female with a hx of hypertension, hyperlipidemia, fibromyalgia, vascular dementia, DM2 and history of CVA in August 2018.  She never had a heart attack in the past. Zio patch placed in February 2020 showed heart rate ranges between 55-142 averaging 83, sinus rhythm with short runs of SVT up to 5 beats was isolated supraventricular extra beats.  There was no atrial fibrillation noted.  She admits to drink up to 4 cups of coffee a day, she was instructed to cut back on caffeine.  I last saw the patient on 06/29/2018 for palpitation, I moved her amlodipine to nighttime and added to 12.5 mg twice daily of metoprolol tartrate for rate control.  TSH was normal.  When she returned to see Dr. Allyson SabalBerry on 07/11/2018, her palpitation has completely resolved.  We also emphasized on the importance of reducing caffeine intake.  Patient presents today for cardiology office visit.  She denies any chest pain or shortness of breath.  She occasionally still have palpitation.  There was one episode that lasted a entire day.  She is aware of of Valsalva maneuver.  I did inform her that if her symptoms become more persistent, she may take additional half a tablet of the metoprolol to try to suppress irregular rhythm.  For the time being, I recommended continue on the current therapy, if palpitations become more frequent or lasting longer, we will increase metoprolol for suppression.  At this time, there is no evidence of atrial fibrillation although the prolonged episode is concerning for afib.  Past Medical History:  Diagnosis Date  .  Diabetes mellitus without complication (HCC)   . Hypertension   . Kidney stone   . Stroke (HCC) 10/24/1996  . Vertigo     Past Surgical History:  Procedure Laterality Date  . ABDOMINAL HYSTERECTOMY    . KIDNEY STONE SURGERY      Current Medications: Current Meds  Medication Sig  . amLODipine (NORVASC) 10 MG tablet Take 10 mg by mouth daily. Take 1 tablet by mouth in the evenings  . aspirin 325 MG tablet Take 325 mg by mouth daily.  Marland Kitchen. atorvastatin (LIPITOR) 80 MG tablet Take 1 tablet (80 mg total) by mouth at bedtime.  . B Complex Vitamins (VITAMIN B COMPLEX PO) Take 1,000 mg by mouth.  . clopidogrel (PLAVIX) 75 MG tablet Take 1 tablet (75 mg total) by mouth daily.  . diclofenac sodium (VOLTAREN) 1 % GEL Apply 2 g topically as needed.   . diphenhydramine-acetaminophen (TYLENOL PM) 25-500 MG TABS tablet Take 1 tablet by mouth at bedtime as needed.  . Dulaglutide (TRULICITY) 1.5 MG/0.5ML SOPN Inject 1.5 mg into the skin once a week. On Tuesday  . glyBURIDE (DIABETA) 5 MG tablet TAKE 2 TABLETS BY MOUTH TWICE DAILY  . Insulin Glargine (TOUJEO SOLOSTAR [place]) Inject 45-50 Units into the skin at bedtime.   Marland Kitchen. lisinopril (PRINIVIL,ZESTRIL) 20 MG tablet Take 20 mg by mouth daily. Take 1 tablet by mouth in the mornings  . Loratadine (CLARITIN PO) Take 1 tablet by mouth daily  as needed.   . metFORMIN (GLUCOPHAGE) 1000 MG tablet Take 1,000 mg by mouth 2 (two) times daily with a meal.  . metoprolol tartrate (LOPRESSOR) 25 MG tablet Take 0.5 tablets (12.5 mg total) by mouth 2 (two) times daily.  . montelukast (SINGULAIR) 10 MG tablet Take 1 tablet by mouth at bedtime.  . Omega-3 Fatty Acids (FISH OIL) 1000 MG CAPS Take 1,000 mg by mouth daily.   . SUMAtriptan (IMITREX) 50 MG tablet Take 50 mg by mouth as needed.   Marland Kitchen VITAMIN D PO Take 5,000 mg by mouth.     Allergies:   Influenza vaccines, Sitagliptin, Guaifenesin, Latex, Other, Phenylephrine-pheniramine-dm, Ultram [tramadol hcl], and Escitalopram  oxalate   Social History   Socioeconomic History  . Marital status: Married    Spouse name: Not on file  . Number of children: Not on file  . Years of education: Not on file  . Highest education level: Not on file  Occupational History  . Not on file  Social Needs  . Financial resource strain: Not on file  . Food insecurity    Worry: Not on file    Inability: Not on file  . Transportation needs    Medical: Not on file    Non-medical: Not on file  Tobacco Use  . Smoking status: Former Smoker    Quit date: 04/30/1982    Years since quitting: 36.6  . Smokeless tobacco: Never Used  Substance and Sexual Activity  . Alcohol use: No  . Drug use: No  . Sexual activity: Not on file  Lifestyle  . Physical activity    Days per week: Not on file    Minutes per session: Not on file  . Stress: Not on file  Relationships  . Social Musician on phone: Not on file    Gets together: Not on file    Attends religious service: Not on file    Active member of club or organization: Not on file    Attends meetings of clubs or organizations: Not on file    Relationship status: Not on file  Other Topics Concern  . Not on file  Social History Narrative  . Not on file     Family History: The patient's family history includes Cancer in an other family member; Diabetes in an other family member; Heart attack in her brother, mother, and another family member; Stroke in her brother, maternal aunt, mother, paternal aunt, and another family member.  ROS:   Please see the history of present illness.     All other systems reviewed and are negative.  EKGs/Labs/Other Studies Reviewed:    The following studies were reviewed today:  Echo 03/24/2017 LV EF: 60% -   65% Study Conclusions  - Left ventricle: The cavity size was normal. There was mild   concentric hypertrophy. Systolic function was normal. The   estimated ejection fraction was in the range of 60% to 65%. Wall   motion was  normal; there were no regional wall motion   abnormalities. Doppler parameters are consistent with abnormal   left ventricular relaxation (grade 1 diastolic dysfunction).   Doppler parameters are consistent with indeterminate ventricular   filling pressure. - Aortic valve: Transvalvular velocity was within the normal range.   There was no stenosis. There was no regurgitation. Valve area   (VTI): 2.06 cm^2. Valve area (Vmax): 1.95 cm^2. Valve area   (Vmean): 1.99 cm^2. - Mitral valve: Transvalvular velocity was within the normal  range.   There was no evidence for stenosis. There was trivial   regurgitation. - Right ventricle: The cavity size was normal. Wall thickness was   normal. Systolic function was normal. - Atrial septum: No defect or patent foramen ovale was identified. - Tricuspid valve: There was trivial regurgitation. - Pulmonary arteries: PA peak pressure: 16 mm Hg (S).  EKG:  EKG is not ordered today.    Recent Labs: 06/29/2018: BUN 19; Creatinine, Ser 0.71; Potassium 4.7; Sodium 142; TSH 1.530  Recent Lipid Panel    Component Value Date/Time   CHOL 116 03/24/2017 0643   TRIG 160 (H) 03/24/2017 0643   HDL 33 (L) 03/24/2017 0643   CHOLHDL 3.5 03/24/2017 0643   VLDL 32 03/24/2017 0643   LDLCALC 51 03/24/2017 0643    Physical Exam:    VS:  BP (!) 120/58   Pulse 75   Temp (!) 96.8 F (36 C)   Ht  (1.6 m)   Wt 175 lb 3.2 oz (79.5 kg)   SpO2 98%   BMI 31.04 kg/m     Wt Readings from Last 3 Encounters:  12/17/18 175 lb 3.2 oz (79.5 kg)  07/11/18 169 lb (76.7 kg)  06/29/18 171 lb 3.2 oz (77.7 kg)     GEN:  Well nourished, well developed in no acute distress HEENT: Normal NECK: No JVD; No carotid bruits LYMPHATICS: No lymphadenopathy CARDIAC: RRR, no murmurs, rubs, gallops RESPIRATORY:  Clear to auscultation without rales, wheezing or rhonchi  ABDOMEN: Soft, non-tender, non-distended MUSCULOSKELETAL:  No edema; No deformity  SKIN: Warm and dry  NEUROLOGIC:  Alert and oriented x 3 PSYCHIATRIC:  Normal affect   ASSESSMENT:    1. Palpitations   2. Essential hypertension   3. Hyperlipidemia LDL goal <70   4. Controlled type 2 diabetes mellitus without complication, without long-term current use of insulin (HCC)   5. H/O: CVA (cerebrovascular accident)    PLAN:    In order of problems listed above:  1. Palpitation: Fairly controlled on the current metoprolol tartrate 12.5 mg twice daily, she does have breakthrough palpitation episodes, previous heart monitor showed no sign of atrial fibrillation although she did have a prolonged episode recently that lasted the whole day.  I recommended she take additional dose of metoprolol if Valsalva maneuver cannot control her symptoms.  She has cut back on coffee quite significantly, unfortunately she is still drink about 2 cups.  She also occasionally drinks tea as well.   2. Hypertension: Blood pressure stable  3. Hyperlipidemia: Lab work recently checked by her primary care provider showed very well-controlled cholesterol.  Continue on Lipitor  4. DM2: On insulin, managed by primary care provider  5. History of CVA: No recurrence.  If her palpitations become more prolonged and more persistent, we may have to consider either increase metoprolol or repeat another heart monitor.   Medication Adjustments/Labs and Tests Ordered: Current medicines are reviewed at length with the patient today.  Concerns regarding medicines are outlined above.  No orders of the defined types were placed in this encounter.  No orders of the defined types were placed in this encounter.   Patient Instructions  Medication Instructions:   Your physician recommends that you continue on your current medications as directed. Please refer to the Current Medication list given to you today.  *If you need a refill on your cardiac medications before your next appointment, please call your pharmacy*  Lab Work:   NONE ordered at this time  of appointment   If you have labs (blood work) drawn today and your tests are completely normal, you will receive your results only by: Marland Kitchen MyChart Message (if you have MyChart) OR . A paper copy in the mail If you have any lab test that is abnormal or we need to change your treatment, we will call you to review the results.  Testing/Procedures:  NONE ordered at this time of appointment   Follow-Up: At [facility], you and your health needs are our priority.  As part of our continuing mission to provide you with exceptional heart care, we have created designated Provider Care Teams.  These Care Teams include your primary Cardiologist (physician) and Advanced Practice Providers (APPs -  Physician Assistants and Nurse Practitioners) who all work together to provide you with the care you need, when you need it.  Your next appointment:   6 month(s)  The format for your next appointment:   Either In Person or Virtual  Provider:   Quay Burow, MD  Other Instructions      Signed, Almyra Deforest, [facility] [facility]  12/17/2018 8:59 AM    [place]

## 2018-12-24 ENCOUNTER — Other Ambulatory Visit: Payer: Self-pay | Admitting: Cardiovascular Disease

## 2019-02-18 ENCOUNTER — Ambulatory Visit: Payer: Medicare Other

## 2019-03-01 ENCOUNTER — Other Ambulatory Visit: Payer: Self-pay

## 2019-03-01 ENCOUNTER — Emergency Department (HOSPITAL_BASED_OUTPATIENT_CLINIC_OR_DEPARTMENT_OTHER): Payer: Medicare Other

## 2019-03-01 ENCOUNTER — Emergency Department (HOSPITAL_BASED_OUTPATIENT_CLINIC_OR_DEPARTMENT_OTHER)
Admission: EM | Admit: 2019-03-01 | Discharge: 2019-03-02 | Disposition: A | Discharge status: Home / Self Care | Payer: Medicare Other | Attending: Emergency Medicine | Admitting: Emergency Medicine

## 2019-03-01 ENCOUNTER — Encounter (HOSPITAL_BASED_OUTPATIENT_CLINIC_OR_DEPARTMENT_OTHER): Payer: Self-pay | Admitting: Emergency Medicine

## 2019-03-01 DIAGNOSIS — U071 COVID-19: Secondary | ICD-10-CM | POA: Diagnosis not present

## 2019-03-01 DIAGNOSIS — R509 Fever, unspecified: Secondary | ICD-10-CM | POA: Insufficient documentation

## 2019-03-01 DIAGNOSIS — E119 Type 2 diabetes mellitus without complications: Secondary | ICD-10-CM | POA: Diagnosis not present

## 2019-03-01 DIAGNOSIS — R112 Nausea with vomiting, unspecified: Secondary | ICD-10-CM | POA: Diagnosis present

## 2019-03-01 DIAGNOSIS — R1013 Epigastric pain: Secondary | ICD-10-CM | POA: Insufficient documentation

## 2019-03-01 DIAGNOSIS — Z8673 Personal history of transient ischemic attack (TIA), and cerebral infarction without residual deficits: Secondary | ICD-10-CM | POA: Insufficient documentation

## 2019-03-01 DIAGNOSIS — R05 Cough: Secondary | ICD-10-CM | POA: Diagnosis not present

## 2019-03-01 DIAGNOSIS — Z79899 Other long term (current) drug therapy: Secondary | ICD-10-CM | POA: Insufficient documentation

## 2019-03-01 DIAGNOSIS — Z7984 Long term (current) use of oral hypoglycemic drugs: Secondary | ICD-10-CM | POA: Insufficient documentation

## 2019-03-01 DIAGNOSIS — I10 Essential (primary) hypertension: Secondary | ICD-10-CM | POA: Diagnosis not present

## 2019-03-01 LAB — URINALYSIS, ROUTINE W REFLEX MICROSCOPIC
Bilirubin Urine: NEGATIVE
Glucose, UA: 100 mg/dL — AB
Ketones, ur: NEGATIVE mg/dL
Leukocytes,Ua: NEGATIVE
Nitrite: NEGATIVE
Protein, ur: >300 mg/dL — AB
Specific Gravity, Urine: 1.025 (ref 1.005–1.030)
pH: 6 (ref 5.0–8.0)

## 2019-03-01 LAB — URINALYSIS, MICROSCOPIC (REFLEX)

## 2019-03-01 LAB — COMPREHENSIVE METABOLIC PANEL
ALT: 24 U/L (ref 0–44)
AST: 22 U/L (ref 15–41)
Albumin: 3 g/dL — ABNORMAL LOW (ref 3.5–5.0)
Alkaline Phosphatase: 68 U/L (ref 38–126)
Anion gap: 9 (ref 5–15)
BUN: 19 mg/dL (ref 8–23)
CO2: 18 mmol/L — ABNORMAL LOW (ref 22–32)
Calcium: 8.8 mg/dL — ABNORMAL LOW (ref 8.9–10.3)
Chloride: 101 mmol/L (ref 98–111)
Creatinine, Ser: 0.78 mg/dL (ref 0.44–1.00)
GFR calc Af Amer: >60 mL/min (ref >60)
GFR calc non Af Amer: >60 mL/min (ref >60)
Glucose, Bld: 284 mg/dL — ABNORMAL HIGH (ref 70–99)
Potassium: 3.6 mmol/L (ref 3.5–5.1)
Sodium: 128 mmol/L — ABNORMAL LOW (ref 135–145)
Total Bilirubin: 0.7 mg/dL (ref 0.3–1.2)
Total Protein: 7.3 g/dL (ref 6.5–8.1)

## 2019-03-01 LAB — CBC WITH DIFFERENTIAL/PLATELET
Abs Immature Granulocytes: 0.02 10*3/uL (ref 0.00–0.07)
Basophils Absolute: 0 10*3/uL (ref 0.0–0.1)
Basophils Relative: 0 %
Eosinophils Absolute: 0 10*3/uL (ref 0.0–0.5)
Eosinophils Relative: 0 %
HCT: 35.4 % — ABNORMAL LOW (ref 36.0–46.0)
Hemoglobin: 11.9 g/dL — ABNORMAL LOW (ref 12.0–15.0)
Immature Granulocytes: 0 %
Lymphocytes Relative: 22 %
Lymphs Abs: 1.5 10*3/uL (ref 0.7–4.0)
MCH: 29.1 pg (ref 26.0–34.0)
MCHC: 33.6 g/dL (ref 30.0–36.0)
MCV: 86.6 fL (ref 80.0–100.0)
Monocytes Absolute: 0.4 10*3/uL (ref 0.1–1.0)
Monocytes Relative: 6 %
Neutro Abs: 4.9 10*3/uL (ref 1.7–7.7)
Neutrophils Relative %: 72 %
Platelets: 165 10*3/uL (ref 150–400)
RBC: 4.09 MIL/uL (ref 3.87–5.11)
RDW: 13.5 % (ref 11.5–15.5)
WBC: 6.8 10*3/uL (ref 4.0–10.5)
nRBC: 0 % (ref 0.0–0.2)

## 2019-03-01 LAB — C-REACTIVE PROTEIN: CRP: 7 mg/dL — ABNORMAL HIGH (ref <1.0)

## 2019-03-01 LAB — PROTIME-INR
INR: 1.1 (ref 0.8–1.2)
Prothrombin Time: 14.1 seconds (ref 11.4–15.2)

## 2019-03-01 LAB — LACTIC ACID, PLASMA
Lactic Acid, Venous: 2.3 mmol/L (ref 0.5–1.9)
Lactic Acid, Venous: 1.3 mmol/L (ref 0.5–1.9)

## 2019-03-01 LAB — CBG MONITORING, ED: Glucose-Capillary: 247 mg/dL — ABNORMAL HIGH (ref 70–99)

## 2019-03-01 LAB — LACTATE DEHYDROGENASE: LDH: 171 U/L (ref 98–192)

## 2019-03-01 LAB — D-DIMER, QUANTITATIVE (NOT AT ARMC): D-Dimer, Quant: 1.31 ug/mL-FEU — ABNORMAL HIGH (ref 0.00–0.50)

## 2019-03-01 LAB — FIBRINOGEN: Fibrinogen: 529 mg/dL — ABNORMAL HIGH (ref 210–475)

## 2019-03-01 MED ORDER — SODIUM CHLORIDE 0.9 % IV BOLUS
500.0000 mL | Freq: Once | INTRAVENOUS | Status: AC
  Administered 2019-03-01: 20:00:00 500 mL via INTRAVENOUS

## 2019-03-01 MED ORDER — SODIUM CHLORIDE 0.9 % IV BOLUS
1000.0000 mL | Freq: Once | INTRAVENOUS | Status: AC
  Administered 2019-03-01: 23:00:00 1000 mL via INTRAVENOUS

## 2019-03-01 MED ORDER — ONDANSETRON HCL 4 MG/2ML IJ SOLN
4.0000 mg | Freq: Once | INTRAMUSCULAR | Status: AC
  Administered 2019-03-01: 20:00:00 4 mg via INTRAVENOUS
  Filled 2019-03-01: qty 2

## 2019-03-01 MED ORDER — DEXAMETHASONE SODIUM PHOSPHATE 10 MG/ML IJ SOLN
6.0000 mg | Freq: Once | INTRAMUSCULAR | Status: AC
  Administered 2019-03-01: 23:00:00 6 mg via INTRAVENOUS
  Filled 2019-03-01: qty 1

## 2019-03-01 MED ORDER — IOHEXOL 350 MG/ML SOLN
100.0000 mL | Freq: Once | INTRAVENOUS | Status: AC | PRN
  Administered 2019-03-01: 100 mL via INTRAVENOUS

## 2019-03-01 NOTE — ED Notes (Signed)
Critical lab result received; lactic acid 2.3; Dr Silverio Lay aware.

## 2019-03-01 NOTE — Discharge Instructions (Signed)
Jill Howard,   You were evaluated in the ER for symptoms consistent with COVID-19 infection. We checked your lab work and imaging while here. Due to dehydration, you were given fluids through your IV. In addition, you were given IV steroids.   Please continue taking your Prednisone tablets as instructed by your primary care doctor. It will be very important to drink lots of fluids as well!   If your symptoms should worsen, please call your primary care doctor or return to the ER.

## 2019-03-01 NOTE — ED Notes (Signed)
  Patient ambulated in the room on SPO2 monitor for about 3-4 minutes.  Patient SPO2 dropped from 96% to 93%, and patient also became tachypneic.  RR went from 22 to 34 while ambulating.  MD notified.

## 2019-03-01 NOTE — ED Triage Notes (Signed)
Tested positive for Covid Jan 28. Has been having nausea, increased malaise, weakness and fever for the past 4 days.  Having trouble controlling Blood Sugar - 226 with EMS.  Temp 101 with EMS - gave Tylenol 1GM at 1847.

## 2019-03-01 NOTE — ED Provider Notes (Signed)
[facility] EMERGENCY DEPARTMENT Provider Note   CSN: [id number] Arrival date & time: 03/01/19  1859     History No chief complaint on file.   Jill Howard is a 79 y.o. female with a past medical history of hypertension, palpitations, CVA who presents to the ED with complaints of nausea and dehydration.  Jill Howard states that approximately 1 week ago, she developed sudden loss of taste and smell that prompted her to go get tested for Covid.  Her Covid test was positive on February 21, 2019.  Then, approximately 3 days ago, she began to feel more malaised with development of fevers, chills, nausea with one episode of vomiting yesterday that was nonbloody non-bilious, epigastric abdominal pain without radiation, generalized weakness, dyspnea on exertion.  No exacerbating or alleviating factors for her abdominal pain.  She denies any shortness of breath at rest with the exception when she has to wear her mask.  She denies any chest pain. Patient feels dehydrated due to difficulty with p.o. intake 2/2 to nausea.  She did follow-up with her PCP who started her on prednisone with a taper  Jill Howard lives at home with her husband and her son.  She notes that her husband is also Covid positive.  Past Medical History:  Diagnosis Date  . Diabetes mellitus without complication (HCC)   . Hypertension   . Kidney stone   . Stroke (HCC) 10/24/1996  . Vertigo     Patient Active Problem List   Diagnosis Date Noted  . Palpitations 07/11/2018  . TIA (transient ischemic attack) 06/13/2017  . Intractable headache   . Hyperlipidemia   . History of stroke   . Neurological symptoms 03/23/2017  . CVA (cerebral vascular accident) (HCC) 10/25/2016  . 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 Essential hypertension 10/24/2016  . Diabetes mellitus with complication (HCC) 10/24/2016  . Chronic pain 10/24/2016    Past Surgical History:  Procedure Laterality Date  . ABDOMINAL HYSTERECTOMY    .  KIDNEY STONE SURGERY       OB History   No obstetric history on file.     Family History  Problem Relation Age of Onset  . Stroke Other   . Heart attack Other   . Cancer Other   . Diabetes Other   . Stroke Mother   . Heart attack Mother   . Stroke Maternal Aunt   . Stroke Paternal Aunt   . Stroke Brother   . Heart attack Brother     Social History   Tobacco Use  . Smoking status: Former Smoker    Quit date: 04/30/1982    Years since quitting: 36.8  . Smokeless tobacco: Never Used  Substance Use Topics  . Alcohol use: No  . Drug use: No    Home Medications Prior to Admission medications   Medication Sig Start Date End Date Taking? Authorizing Provider  amLODipine (NORVASC) 10 MG tablet Take 10 mg by mouth daily. Take 1 tablet by mouth in the evenings    [provider]  aspirin 325 MG tablet Take 325 mg by mouth daily.    [provider]  atorvastatin (LIPITOR) 80 MG tablet Take 1 tablet (80 mg total) by mouth at bedtime. 10/25/16   Rai, Delene Ruffini, MD  B Complex Vitamins (VITAMIN B COMPLEX PO) Take 1,000 mg by mouth.    [provider]  clopidogrel (PLAVIX) 75 MG tablet Take 1 tablet (75 mg total) by mouth daily. 10/26/16   Rai, Ripudeep  K, MD  diclofenac sodium (VOLTAREN) 1 % GEL Apply 2 g topically as needed.  11/03/17   [provider]  diphenhydramine-acetaminophen (TYLENOL PM) 25-500 MG TABS tablet Take 1 tablet by mouth at bedtime as needed.    [provider]  Dulaglutide (TRULICITY) 1.5 MG/0.5ML SOPN Inject 1.5 mg into the skin once a week. On Tuesday    [provider]  glyBURIDE (DIABETA) 5 MG tablet TAKE 2 TABLETS BY MOUTH TWICE DAILY 10/09/17   [provider]  Insulin Glargine (TOUJEO SOLOSTAR [place]) Inject 45-50 Units into the skin at bedtime.     [provider]  lisinopril (PRINIVIL,ZESTRIL) 20 MG tablet Take 20 mg by mouth daily. Take 1 tablet by mouth in the mornings 09/09/16   [provider]  Loratadine (CLARITIN PO) Take 1 tablet by mouth daily as needed.     [provider]  metFORMIN (GLUCOPHAGE) 1000 MG tablet Take 1,000 mg by mouth 2 (two) times daily with a meal.    [provider]  metoprolol tartrate (LOPRESSOR) 25 MG tablet TAKE 1/2 A TABLET BY MOUTH TWICE DAILY 12/27/18   Runell Gess, MD  montelukast (SINGULAIR) 10 MG tablet Take 1 tablet by mouth at bedtime. 01/02/18   [provider]  Omega-3 Fatty Acids (FISH OIL) 1000 MG CAPS Take 1,000 mg by mouth daily.     [provider]  SUMAtriptan (IMITREX) 50 MG tablet Take 50 mg by mouth as needed.  06/26/17   [provider]  VITAMIN D PO Take 5,000 mg by mouth.    [provider]    Allergies    Influenza vaccines, Sitagliptin, Guaifenesin, Latex, Other, Phenylephrine-pheniramine-dm, Ultram [tramadol hcl], and Escitalopram oxalate  Review of Systems   Review of Systems  Constitutional: Positive for chills and fever.  HENT:       Loss of taste  Respiratory: Positive for cough and shortness of breath (On exertion). Negative for wheezing.   Cardiovascular: Negative for chest pain.  Gastrointestinal: Positive for abdominal pain (Epigastric), nausea and vomiting. Negative for diarrhea.  Genitourinary: Positive for decreased urine volume. Negative for difficulty urinating, dysuria and hematuria.  Neurological: Positive for weakness (Generalized). Negative for dizziness.   Physical Exam Updated Vital Signs BP 136/61 (BP Location: Right Arm)   Pulse 79   Temp 99.3 F (37.4 C) (Oral)   Resp 20   Ht 5\' 3"  (1.6 m)   Wt 77.1 kg   SpO2 95%   BMI 30.10 kg/m   Physical Exam Vitals and nursing note reviewed.  Constitutional:      General: She is not in acute distress.    Appearance: She is normal weight.  Cardiovascular:     Rate and Rhythm: Normal rate and regular rhythm.     Heart sounds: No murmur.  Pulmonary:     Effort: Pulmonary effort is  normal. No respiratory distress.     Breath sounds: Normal breath sounds. No wheezing, rhonchi or rales.  Abdominal:     General: Bowel sounds are normal. There is no distension.     Palpations: Abdomen is soft.     Tenderness: There is no abdominal tenderness. There is no guarding.  Skin:    General: Skin is warm and dry.  Neurological:     General: No focal deficit present.     Mental Status: She is alert and oriented to person, place, and time. Mental status is at baseline.  Psychiatric:  Mood and Affect: Mood normal.        Behavior: Behavior normal.    ED Results / Procedures / Treatments   Labs (all labs ordered are listed, but only abnormal results are displayed) Labs Reviewed  COMPREHENSIVE METABOLIC PANEL - Abnormal; Notable for the following components:      Result Value   Sodium 128 (*)    CO2 18 (*)    Glucose, Bld 284 (*)    Calcium 8.8 (*)    Albumin 3.0 (*)    All other components within normal limits  CBC WITH DIFFERENTIAL/PLATELET - Abnormal; Notable for the following components:   Hemoglobin 11.9 (*)    HCT 35.4 (*)    All other components within normal limits  D-DIMER, QUANTITATIVE (NOT AT [facility]) - Abnormal; Notable for the following components:   D-Dimer, Quant 1.31 (*)    All other components within normal limits  LACTIC ACID, PLASMA - Abnormal; Notable for the following components:   Lactic Acid, Venous 2.3 (*)    All other components within normal limits  URINALYSIS, ROUTINE W REFLEX MICROSCOPIC - Abnormal; Notable for the following components:   Glucose, UA 100 (*)    Hgb urine dipstick TRACE (*)    Protein, ur >300 (*)    All other components within normal limits  C-REACTIVE PROTEIN - Abnormal; Notable for the following components:   CRP 7.0 (*)    All other components within normal limits  FIBRINOGEN - Abnormal; Notable for the following components:   Fibrinogen 529 (*)    All other components within normal limits  URINALYSIS,  MICROSCOPIC (REFLEX) - Abnormal; Notable for the following components:   Bacteria, UA MANY (*)    All other components within normal limits  CBG MONITORING, ED - Abnormal; Notable for the following components:   Glucose-Capillary 247 (*)    All other components within normal limits  PROTIME-INR  LACTIC ACID, PLASMA  LACTATE DEHYDROGENASE    EKG EKG Interpretation  Date/Time:  Friday March 01 2019 19:34:14 EST Ventricular Rate:  93 PR Interval:    QRS Duration: 102 QT Interval:  366 QTC Calculation: 456 R Axis:   -10 Text Interpretation: Sinus rhythm Atrial premature complex Low voltage, precordial leads No significant change since last tracing Confirmed by Richardean Canal [id number]) on 03/01/2019 7:38:02 PM   Radiology CT Angio Chest PE W and/or Wo Contrast  Result Date: 03/01/2019 CLINICAL DATA:  Shortness of breath EXAM: CT ANGIOGRAPHY CHEST WITH CONTRAST TECHNIQUE: Multidetector CT imaging of the chest was performed using the standard protocol during bolus administration of intravenous contrast. Multiplanar CT image reconstructions and MIPs were obtained to evaluate the vascular anatomy. CONTRAST:  OMNIPAQUE IOHEXOL 350 MG/ML SOLN COMPARISON:  None. FINDINGS: Cardiovascular: There is a optimal opacification of the pulmonary arteries. There is no central,segmental, or subsegmental filling defects within the pulmonary arteries. The heart is normal in size. No pericardial effusion or thickening. No evidence right heart strain. There is normal three-vessel brachiocephalic anatomy without proximal stenosis. The thoracic aorta is normal in appearance. Coronary artery calcifications. Scattered aortic atherosclerosis Mediastinum/Nodes: No hilar, mediastinal, or axillary adenopathy. Thyroid gland, trachea, and esophagus demonstrate no significant findings. Lungs/Pleura: Multifocal patchy ground-glass opacities are seen predominantly within the periphery of the left lower lobe and posterior right  lower lobe. No pneumothorax or pleural effusion. Upper Abdomen: No acute abnormalities present in the visualized portions of the upper abdomen. Musculoskeletal: No chest wall abnormality. No acute or significant osseous findings. Review  of the MIP images confirms the above findings. IMPRESSION: No central, segmental, or subsegmental pulmonary embolism. Multifocal patchy/ground-glass opacities in the bilateral lower lobes, left greater than right, consistent with COVID pneumonia. Electronically Signed   By: Prudencio Pair M.D.   On: 03/01/2019 22:13   DG Chest Portable 1 View  Result Date: 03/01/2019 CLINICAL DATA:  Nausea, weakness and fever. EXAM: PORTABLE CHEST 1 VIEW COMPARISON:  Jun 13, 2017 FINDINGS: Mild areas of atelectasis and/or infiltrate are seen within the bilateral lung bases and along the periphery of the mid left lung. There is no evidence of a pleural effusion or pneumothorax. The heart size and mediastinal contours are within normal limits. The visualized skeletal structures are unremarkable. IMPRESSION: Mild bilateral atelectasis and/or infiltrate. Electronically Signed   By: Virgina Norfolk M.D.   On: 03/01/2019 19:36    Procedures Procedures (including critical care time)  Medications Ordered in ED Medications  sodium chloride 0.9 % bolus 500 mL ( Intravenous Stopped 03/01/19 2102)  ondansetron (ZOFRAN) injection 4 mg (4 mg Intravenous Given 03/01/19 1953)  sodium chloride 0.9 % bolus 1,000 mL (1,000 mLs Intravenous New Bag/Given 03/01/19 2236)  iohexol (OMNIPAQUE) 350 MG/ML injection 100 mL (100 mLs Intravenous Contrast Given 03/01/19 2142)  dexamethasone (DECADRON) injection 6 mg (6 mg Intravenous Given 03/01/19 2242)    ED Course  I have reviewed the triage vital signs and the nursing notes.  Pertinent labs & imaging results that were available during my care of the patient were reviewed by me and considered in my medical decision making (see chart for details).    MDM  Rules/Calculators/A&P                      Jill Howard is a 79 year old female who is known Covid positive presenting to the ED with fever, chills, dyspnea on exertion, nausea with decreased p.o. intake.  Her symptoms have been ongoing for at least 3 days.    Via EMS, she was noted to be febrile that resolved with Tylenol.  Initially mildly tachycardic which resolved with IV fluid resuscitation.  Chest x-ray showed mild bilateral atelectasis versus infiltrate.  CBC unremarkable, CMP shows mild hyponatremia consistent with decreased p.o. intake.  No signs of AKI.  Laboratory markers were ordered, D-dimer was markedly elevated at 1.31.  CTA chest was obtained and was negative for PE, but showed multifocal patchy groundglass opacities consistent with Covid pneumonia.  The rest of her inflammatory markers were elevated consistent with Covid pneumonia as well.  Lactic acid was initially elevated at 2.3, but came down to 1.3 after receiving IVF.  Patient was saturating well on room air and saturations were maintained on ambulation as well.  Given that she has remained hemodynamically stable without need for supplemental oxygen, patient is stable for discharge home with strict return precautions.  This was discussed with the patient and she is in agreement with the plan.  While here she did receive a one-time dose of IV Decadron.  Will instruct to continue her outpatient prednisone as given to her by her PCP.  Final Clinical Impression(s) / ED Diagnoses Final diagnoses:  COVID-19 virus infection    Rx / DC Orders ED Discharge Orders    None       Jose Persia, MD 03/01/19 2317    Drenda Freeze, MD 03/02/19 [phone]

## 2019-04-16 ENCOUNTER — Encounter: Payer: Self-pay | Admitting: *Deleted

## 2019-04-16 DIAGNOSIS — Z006 Encounter for examination for normal comparison and control in clinical research program: Secondary | ICD-10-CM

## 2019-04-16 NOTE — Research (Signed)
49M Stroke follow up Patient doing well, no complaints of chest pain or shortness of breath.  Patient is back on ASA 325 mg daily.  She did have covid back in February and is doing ok from it.   Current Outpatient Medications:  .  amLODipine (NORVASC) 10 MG tablet, Take 10 mg by mouth daily. Take 1 tablet by mouth in the evenings, Disp: , Rfl:  .  aspirin 325 MG tablet, Take 325 mg by mouth daily., Disp: , Rfl:  .  atorvastatin (LIPITOR) 80 MG tablet, Take 1 tablet (80 mg total) by mouth at bedtime., Disp: 30 tablet, Rfl: 3 .  B Complex Vitamins (VITAMIN B COMPLEX PO), Take 1,000 mg by mouth., Disp: , Rfl:  .  clopidogrel (PLAVIX) 75 MG tablet, Take 1 tablet (75 mg total) by mouth daily., Disp: 30 tablet, Rfl: 4 .  diclofenac sodium (VOLTAREN) 1 % GEL, Apply 2 g topically as needed. , Disp: , Rfl:  .  diphenhydramine-acetaminophen (TYLENOL PM) 25-500 MG TABS tablet, Take 1 tablet by mouth at bedtime as needed., Disp: , Rfl:  .  Dulaglutide (TRULICITY) 1.5 MG/0.5ML SOPN, Inject 1.5 mg into the skin once a week. On Tuesday, Disp: , Rfl:  .  glyBURIDE (DIABETA) 5 MG tablet, TAKE 2 TABLETS BY MOUTH TWICE DAILY, Disp: , Rfl:  .  Insulin Glargine (TOUJEO SOLOSTAR ), Inject 45-50 Units into the skin at bedtime. , Disp: , Rfl:  .  lisinopril (PRINIVIL,ZESTRIL) 20 MG tablet, Take 20 mg by mouth daily. Take 1 tablet by mouth in the mornings, Disp: , Rfl:  .  Loratadine (CLARITIN PO), Take 1 tablet by mouth daily as needed. , Disp: , Rfl:  .  metFORMIN (GLUCOPHAGE) 1000 MG tablet, Take 1,000 mg by mouth 2 (two) times daily with a meal., Disp: , Rfl:  .  metoprolol tartrate (LOPRESSOR) 25 MG tablet, TAKE 1/2 A TABLET BY MOUTH TWICE DAILY, Disp: 90 tablet, Rfl: 1 .  montelukast (SINGULAIR) 10 MG tablet, Take 1 tablet by mouth at bedtime., Disp: , Rfl:  .  Omega-3 Fatty Acids (FISH OIL) 1000 MG CAPS, Take 1,000 mg by mouth daily. , Disp: , Rfl:  .  SUMAtriptan (IMITREX) 50 MG tablet, Take 50 mg by mouth  as needed. , Disp: , Rfl:  .  VITAMIN D PO, Take 5,000 mg by mouth., Disp: , Rfl:

## 2019-05-23 ENCOUNTER — Ambulatory Visit: Payer: Medicare Other

## 2019-06-19 ENCOUNTER — Encounter: Payer: Self-pay | Admitting: Cardiovascular Disease

## 2019-06-19 ENCOUNTER — Ambulatory Visit (INDEPENDENT_AMBULATORY_CARE_PROVIDER_SITE_OTHER): Payer: Medicare Other | Admitting: Cardiovascular Disease

## 2019-06-19 ENCOUNTER — Other Ambulatory Visit: Payer: Self-pay

## 2019-06-19 DIAGNOSIS — I10 Essential (primary) hypertension: Secondary | ICD-10-CM | POA: Diagnosis not present

## 2019-06-19 DIAGNOSIS — R002 Palpitations: Secondary | ICD-10-CM

## 2019-06-19 DIAGNOSIS — E782 Mixed hyperlipidemia: Secondary | ICD-10-CM | POA: Diagnosis not present

## 2019-06-19 MED ORDER — METOPROLOL TARTRATE 25 MG PO TABS: 25.0000 mg | ORAL_TABLET | Freq: Two times a day (BID) | ORAL | 3 refills | Status: DC

## 2019-06-19 NOTE — Progress Notes (Signed)
06/19/2019 Jill Howard   Aug 21, 1940  [id number]  Primary Physician Drosinis, Leonia Reader, PA-C Primary Cardiologist: Runell Gess MD Nicholes Calamity, [place]  HPI:  Jill Howard is a 79 y.o.  married Caucasian female mother of 2 children, grandmother of 4 grandchildren referred by Hayden Rasmussen, PA-C for cardiovascular valuation because of palpitations.  I last saw her in the office 07/11/2018.Her cardiac risk factors are notable for type 2 diabetes, essential hypertension, hyperlipidemia, fibromyalgia and vascular dementia. She has had a stroke 10/24/2016 and suffers from migraine headaches as well. She does not smoke. She is never had a heart attack. She denies chest pain. She gets occasional shortness of breath when walking and has occasional lower extremity edema in her feet. 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 She apparently had an office visit with Hayden Rasmussen 03/12/2018 at which time she was complaining of upper respiratory tract symptoms and was treated with Augmentin. She was also complaining of palpitations at that time. AZO patch was placed from 03/19/2020 - 04/02/2018 which showed heart rates ranging from 55-1 42 averaging 83, sinus rhythm. She did have short runs of SVT up to 5 beats with isolated supraventricular extra beats and rare ventricular extra beats. There was no atrial fibrillation noted. She does admit to drinking 4 cups of coffee a day. She is not very symptomatic from these palpitations other than she notices them.  She saw Azalee Course , PA-C in the office 07/01/2018 who placed her on low-dose beta-blocker just resulted in complete resolution of her symptoms.  Blood work was drawn that revealed a normal TSH.  We talked about the importance of reducing her caffeine intake as well.  Since I saw her in the office a year ago she continues to do well.  She did receive her second dose of the Moderna vaccine on May 10.  Since that time she is  noticed increasing arthritic pain, some lower extremity edema, and increased palpitations.  She has decreased her caffeine intake from 4 cups to 2 cups of coffee a day.  She denies chest pain or shortness of breath.    Current Meds  Medication Sig  . amLODipine (NORVASC) 10 MG tablet Take 10 mg by mouth daily. Take 1 tablet by mouth in the evenings  . aspirin 325 MG tablet Take 325 mg by mouth daily.  Marland Kitchen atorvastatin (LIPITOR) 80 MG tablet Take 1 tablet (80 mg total) by mouth at bedtime.  . B Complex Vitamins (VITAMIN B COMPLEX PO) Take 1,000 mg by mouth.  . clopidogrel (PLAVIX) 75 MG tablet Take 1 tablet (75 mg total) by mouth daily.  . diclofenac sodium (VOLTAREN) 1 % GEL Apply 2 g topically as needed.   . diphenhydramine-acetaminophen (TYLENOL PM) 25-500 MG TABS tablet Take 1 tablet by mouth at bedtime as needed.  . Dulaglutide (TRULICITY) 1.5 MG/0.5ML SOPN Inject 1.5 mg into the skin once a week. On Tuesday  . glimepiride (AMARYL) 4 MG tablet Take 4 mg by mouth 2 (two) times daily.  . Insulin Glargine (TOUJEO SOLOSTAR [place]) Inject 45-50 Units into the skin at bedtime.   Marland Kitchen lisinopril (PRINIVIL,ZESTRIL) 20 MG tablet Take 20 mg by mouth daily. Take 1 tablet by mouth in the mornings  . Loratadine (CLARITIN PO) Take 1 tablet by mouth daily as needed.   . metFORMIN (GLUCOPHAGE) 1000 MG tablet Take 1,000 mg by mouth 2 (two) times daily with a meal.  . metoprolol tartrate (LOPRESSOR) 25  MG tablet Take 1 tablet (25 mg total) by mouth 2 (two) times daily.  . montelukast (SINGULAIR) 10 MG tablet Take 1 tablet by mouth at bedtime.  . Omega-3 Fatty Acids (FISH OIL) 1000 MG CAPS Take 1,000 mg by mouth daily.   . SUMAtriptan (IMITREX) 50 MG tablet Take 50 mg by mouth as needed.   Marland Kitchen VITAMIN D PO Take 5,000 mg by mouth.  . [DISCONTINUED] metoprolol tartrate (LOPRESSOR) 25 MG tablet TAKE 1/2 A TABLET BY MOUTH TWICE DAILY     Allergies  Allergen Reactions  . Influenza Vaccines Swelling    Arm was  swollen in 2002   . Sitagliptin Swelling  . Guaifenesin Other (See Comments)    Unknown  . Latex Other (See Comments)    Unknown  . Other Other (See Comments)    Band-Aid  . Phenylephrine-Pheniramine-Dm Other (See Comments)    Unknown  . Ultram [Tramadol Hcl] Other (See Comments)    hallucinations  . Escitalopram Oxalate Other (See Comments)    Hallucinations    Social History   Socioeconomic History  . Marital status: Married    Spouse name: Not on file  . Number of children: Not on file  . Years of education: Not on file  . Highest education level: Not on file  Occupational History  . Not on file  Tobacco Use  . Smoking status: Former Smoker    Quit date: 04/30/1982    Years since quitting: 37.1  . Smokeless tobacco: Never Used  Substance and Sexual Activity  . Alcohol use: No  . Drug use: No  . Sexual activity: Not on file  Other Topics Concern  . Not on file  Social History Narrative  . Not on file   Social Determinants of Health   Financial Resource Strain:   . Difficulty of Paying Living Expenses:   Food Insecurity:   . Worried About Charity fundraiser in the Last Year:   . Arboriculturist in the Last Year:   Transportation Needs:   . Film/video editor (Medical):   Marland Kitchen Lack of Transportation (Non-Medical):   Physical Activity:   . Days of Exercise per Week:   . Minutes of Exercise per Session:   Stress:   . Feeling of Stress :   Social Connections:   . Frequency of Communication with Friends and Family:   . Frequency of Social Gatherings with Friends and Family:   . Attends Religious Services:   . Active Member of Clubs or Organizations:   . Attends Archivist Meetings:   Marland Kitchen Marital Status:   Intimate Partner Violence:   . Fear of Current or Ex-Partner:   . Emotionally Abused:   Marland Kitchen Physically Abused:   . Sexually Abused:      Review of Systems: General: negative for chills, fever, night sweats or weight changes.  Cardiovascular:  negative for chest pain, dyspnea on exertion, edema, orthopnea, palpitations, paroxysmal nocturnal dyspnea or shortness of breath Dermatological: negative for rash Respiratory: negative for cough or wheezing Urologic: negative for hematuria Abdominal: negative for nausea, vomiting, diarrhea, bright red blood per rectum, melena, or hematemesis Neurologic: negative for visual changes, syncope, or dizziness All other systems reviewed and are otherwise negative except as noted above.    Blood pressure (!) 130/54, pulse 84, height 5\' 3"  (1.6 m), weight 174 lb 6.4 oz (79.1 kg), SpO2 97 %.  General appearance: alert and no distress Neck: no adenopathy, no carotid bruit, no JVD,  supple, symmetrical, trachea midline and thyroid not enlarged, symmetric, no tenderness/mass/nodules Lungs: clear to auscultation bilaterally Heart: regular rate and rhythm, S1, S2 normal, no murmur, click, rub or gallop Extremities: extremities normal, atraumatic, no cyanosis or edema Pulses: 2+ and symmetric Skin: Skin color, texture, turgor normal. No rashes or lesions Neurologic: Alert and oriented X 3, normal strength and tone. Normal symmetric reflexes. Normal coordination and gait  EKG not performed today  ASSESSMENT AND PLAN:   Essential hypertension History of essential hypertension blood pressure measured today 130/54.  She is on amlodipine, lisinopril and metoprolol.  Hyperlipidemia History of hyperlipidemia on statin therapy with lipid profile performed 03/24/2017 revealing total cholesterol 116, LDL 51 HDL 53.  Palpitations History of palpitations in the past improved with reducing caffeine intake and low-dose beta-blocker.  Is receiving her second dose of the Moderna vaccine on May 10 she is noticed increasing frequency of palpitations.  She drinks 2 cups of coffee which is currently week.  We will increase her beta-blocker to a full dose twice daily.  She will see Azalee Course PA-C back in 3 months to further  evaluate      Runell Gess MD [facility], [facility] 06/19/2019 10:16 AM

## 2019-06-19 NOTE — Patient Instructions (Signed)
Medication Instructions:  INCREASE Metoprolol Tartrate to 25 mg twice day *If you need a refill on your cardiac medications before your next appointment, please call your pharmacy*   Lab Work: None ordered If you have labs (blood work) drawn today and your tests are completely normal, you will receive your results only by: Marland Kitchen MyChart Message (if you have MyChart) OR . A paper copy in the mail If you have any lab test that is abnormal or we need to change your treatment, we will call you to review the results.   Testing/Procedures: None ordered   Follow-Up: At [facility], you and your health needs are our priority.  As part of our continuing mission to provide you with exceptional heart care, we have created designated Provider Care Teams.  These Care Teams include your primary Cardiologist (physician) and Advanced Practice Providers (APPs -  Physician Assistants and Nurse Practitioners) who all work together to provide you with the care you need, when you need it.  We recommend signing up for the patient portal called "MyChart".  Sign up information is provided on this After Visit Summary.  MyChart is used to connect with patients for Virtual Visits (Telemedicine).  Patients are able to view lab/test results, encounter notes, upcoming appointments, etc.  Non-urgent messages can be sent to your provider as well.   To learn more about what you can do with MyChart, go to [email or web address].    Your next appointment:   Follow up in 3 months with Azalee Course, PA Follow up in 6 months with Dr. Allyson Sabal

## 2019-06-19 NOTE — Assessment & Plan Note (Signed)
History of essential hypertension blood pressure measured today 130/54.  She is on amlodipine, lisinopril and metoprolol.

## 2019-06-19 NOTE — Assessment & Plan Note (Signed)
History of hyperlipidemia on statin therapy with lipid profile performed 03/24/2017 revealing total cholesterol 116, LDL 51 HDL 53.

## 2019-06-19 NOTE — Assessment & Plan Note (Signed)
History of palpitations in the past improved with reducing caffeine intake and low-dose beta-blocker.  Is receiving her second dose of the Moderna vaccine on May 10 she is noticed increasing frequency of palpitations.  She drinks 2 cups of coffee which is currently week.  We will increase her beta-blocker to a full dose twice daily.  She will see Azalee Course PA-C back in 3 months to further evaluate

## 2019-08-30 NOTE — Progress Notes (Signed)
Office Visit Note  Patient: Jill Howard             Date of Birth: [birth year]           MRN: [id number]             PCP: Drosinis, Leonia Reader, PA-C Referring: Kathaleen Bury* Visit Date: 09/13/2019 Occupation: @GUAROCC @  Subjective:  Pain in multiple joints.   History of Present Illness: Jill Howard is a 79 y.o. female seen in consultation per request of her PCP for evaluation of joint pain.  According to the patient she has had shoulder joint pain since 2006.  She has difficulty curling her hair.  She states she had second Covid shot in May 2021 since then her joint pain has gotten worse.  She describes pain in almost all of her joints.  She has pain in her shoulders, her elbows, hands, both hips, knees, ankles and feet.  She states she used to be very active.  She states she has noticed some swelling in her hands.  There is no family history of autoimmune disease.  Her mother most likely had osteoarthritis.  She denies any history of oral ulcers, sicca symptoms, Raynaud's phenomenon, lymphadenopathy, photosensitivity.  Activities of Daily Living:  Patient reports morning stiffness for 15-30  minutes.   Patient Reports nocturnal pain.  Difficulty dressing/grooming: Reports Difficulty climbing stairs: Reports Difficulty getting out of chair: Reports Difficulty using hands for taps, buttons, cutlery, and/or writing: Reports  Review of Systems  Constitutional: Positive for fatigue. Negative for night sweats, weight gain and weight loss.  HENT: Negative for mouth sores, trouble swallowing, trouble swallowing, mouth dryness and nose dryness.   Eyes: Negative for pain, redness, itching, visual disturbance and dryness.  Respiratory: Negative for cough, shortness of breath and difficulty breathing.   Cardiovascular: Negative for chest pain, palpitations, hypertension, irregular heartbeat and swelling in legs/feet.  Gastrointestinal: Positive for constipation. Negative for blood in  stool and diarrhea.  Endocrine: Negative for increased urination.  Genitourinary: Negative for difficulty urinating and vaginal dryness.  Musculoskeletal: Positive for arthralgias, joint pain, joint swelling, myalgias, muscle weakness, morning stiffness and myalgias. Negative for muscle tenderness.  Skin: Negative for color change, rash, hair loss, redness, skin tightness, ulcers and sensitivity to sunlight.  Allergic/Immunologic: Negative for susceptible to infections.  Neurological: Positive for headaches, memory loss and weakness. Negative for dizziness, numbness and night sweats.  Hematological: Negative for bruising/bleeding tendency and swollen glands.  Psychiatric/Behavioral: Positive for confusion. Negative for depressed mood and sleep disturbance. The patient is not nervous/anxious.     PMFS History:  Patient Active Problem List   Diagnosis Date Noted   Palpitations 07/11/2018   TIA (transient ischemic attack) 06/13/2017   Intractable headache    Hyperlipidemia    History of stroke    Neurological symptoms 03/23/2017   CVA (cerebral vascular accident) (HCC) 10/25/2016   Stroke (cerebrum) (HCC) 10/24/2016   Essential hypertension 10/24/2016   Diabetes mellitus with complication (HCC) 10/24/2016   Chronic pain 10/24/2016    Past Medical History:  Diagnosis Date   Diabetes mellitus without complication (HCC)    Hypertension    Kidney stone    Stroke (HCC) 10/24/1996   Vertigo     Family History  Problem Relation Age of Onset   Stroke Other    Heart attack Other    Cancer Other    Diabetes Other    Stroke Mother    Heart attack Mother    Stroke Maternal  Aunt    Stroke Paternal Aunt    Rheum arthritis Paternal Aunt    Stroke Brother    Heart attack Brother    COPD Brother    Rheum arthritis Paternal Aunt    Diabetes Son    Heart disease Son    Diabetes Daughter    Past Surgical History:  Procedure Laterality Date   ABDOMINAL  HYSTERECTOMY     KIDNEY STONE SURGERY     Social History   Social History Narrative   Not on file   Immunization History  Administered Date(s) Administered   Moderna SARS-COVID-2 Vaccination 04/25/2019, 06/03/2019     Objective: Vital Signs: BP 137/83 (BP Location: Right Arm, Patient Position: Sitting, Cuff Size: Normal)    Pulse 71    Resp 15    Ht 5\' 3"  (1.6 m)    Wt 177 lb (80.3 kg)    BMI 31.35 kg/m    Physical Exam Vitals and nursing note reviewed.  Constitutional:      Appearance: She is well-developed.  HENT:     Head: Normocephalic and atraumatic.  Eyes:     Conjunctiva/sclera: Conjunctivae normal.  Cardiovascular:     Rate and Rhythm: Normal rate and regular rhythm.     Heart sounds: Normal heart sounds.  Pulmonary:     Effort: Pulmonary effort is normal.     Breath sounds: Normal breath sounds.  Abdominal:     General: Bowel sounds are normal.     Palpations: Abdomen is soft.  Musculoskeletal:     Cervical back: Normal range of motion.  Lymphadenopathy:     Cervical: No cervical adenopathy.  Skin:    General: Skin is warm and dry.     Capillary Refill: Capillary refill takes less than 2 seconds.  Neurological:     Mental Status: She is alert and oriented to person, place, and time.  Psychiatric:        Behavior: Behavior normal.      Musculoskeletal Exam: She has good range of motion of her cervical spine.  She thoracic kyphosis.  Shoulder joints were in good range of motion with discomfort.  Elbow joints were in good range of motion.  She had tenderness on palpation of bilateral wrist joints over MCPs and PIPs.  She had trochanteric bursitis bilaterally.  She had painful range of motion of bilateral knee joints with no synovitis.  She had pedal edema.  She had tenderness over ankle joints and MTPs and PIPs.  CDAI Exam: CDAI Score: -- Patient Global: --; Provider Global: -- Swollen: --; Tender: -- Joint Exam 09/13/2019   No joint exam has been  documented for this visit   There is currently no information documented on the homunculus. Go to the Rheumatology activity and complete the homunculus joint exam.  Investigation: No additional findings.  Imaging: XR HIPS BILAT W OR W/O PELVIS 3-4 VIEWS  Result Date: 09/13/2019 No femoral acetabular joint narrowing was noted.  Bilateral hip joints were within normal limits.  No SI joint narrowing was noted. Impression: Unremarkable x-ray of the hip joints.  XR Foot 2 Views Left  Result Date: 09/13/2019 First MTP, PIP and DIP narrowing was noted.  No intertarsal, tibiotalar or subtalar joint space narrowing was noted.  Inferior and posterior calcaneal spurs were noted.  No erosive changes were noted. Impression: These findings are consistent with osteoarthritis of the foot.  XR Foot 2 Views Right  Result Date: 09/13/2019 Right first MTP severe narrowing and valgus deformity was  noted.  PIP and DIP narrowing was noted.  Callus formation was noted in the second metatarsal.  Dorsal spurring was noted.  No intertarsal, tibiotalar or subtalar joint space narrowing was noted.  Posterior calcaneal spur was noted. Impression: These findings are consistent with osteoarthritis of the foot.  Callus formation in the second metatarsal was noted.  XR Hand 2 View Left  Result Date: 09/13/2019 CMC, PIP and DIP narrowing was noted.  No MCP, intercarpal radiocarpal joint space narrowing was noted.  No erosive changes were noted. Impression: These findings are consistent with osteoarthritis of the hand.  XR Hand 2 View Right  Result Date: 09/13/2019 CMC, PIP and DIP narrowing was noted.  No MCP, intercarpal radiocarpal joint space narrowing was noted.  Possible callus was noted in the third metacarpal.  No erosive changes were noted. 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  XR KNEE 3 VIEW LEFT  Result Date: 09/13/2019 Severe medial compartment narrowing with medial  osteophytes were noted.  Severe patellofemoral narrowing was noted. 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  XR KNEE 3 VIEW RIGHT  Result Date: 09/13/2019 Mild medial compartment narrowing of the knee joint was noted.  Lateral osteophytes were noted.  Intercondylar osteophytes are noted.  Severe patellofemoral narrowing was noted. Impression: These findings were consistent with mild osteoarthritis and severe chondromalacia patella.  XR Lumbar Spine 2-3 Views  Result Date: 09/13/2019 Severe levoscoliosis of lumbar spine was noted.  Multilevel spondylosis was noted.  Facet joint arthropathy was noted.  Severe narrowing was noted between L1 and L2.  Calcification of blood vessels was noted. Impression: These findings are consistent with severe scoliosis, multilevel spondylosis and facet joint arthropathy..  XR Shoulder Left  Result Date: 09/13/2019 No glenohumeral joint space narrowing was noted.  Spurring of the greater tubercle was noted.  No acromioclavicular joint space narrowing was noted.  No chondrocalcinosis was noted. Impression: Some osteoarthritic changes were noted in the shoulder joint.  XR Shoulder Right  Result Date: 09/13/2019 No glenohumeral or acromioclavicular joint space narrowing was noted.  No chondrocalcinosis was noted. Impression: Unremarkable x-ray of the shoulder joint.   Recent Labs: Lab Results  Component Value Date   WBC 6.8 03/01/2019   HGB 11.9 (L) 03/01/2019   PLT 165 03/01/2019   NA 128 (L) 03/01/2019   K 3.6 03/01/2019   CL 101 03/01/2019   CO2 18 (L) 03/01/2019   GLUCOSE 284 (H) 03/01/2019   BUN 19 03/01/2019   CREATININE 0.78 03/01/2019   BILITOT 0.7 03/01/2019   ALKPHOS 68 03/01/2019   AST 22 03/01/2019   ALT 24 03/01/2019   PROT 7.3 03/01/2019   ALBUMIN 3.0 (L) 03/01/2019   CALCIUM 8.8 (L) 03/01/2019   GFRAA >60 03/01/2019    Speciality Comments: No specialty comments  available.  Procedures:  No procedures performed Allergies: Influenza vaccines, Sitagliptin, Guaifenesin, Latex, Nitrofurantoin, Other, Phenylephrine-pheniramine-dm, Ultram [tramadol hcl], and Escitalopram oxalate   Assessment / Plan:     Visit Diagnoses: Positive ANA (antinuclear antibody) - ANA 1:320H, CRP 1.9 -she has positive ANA.  To complete the work-up I will obtain educational antibodies today.  Plan: CBC with Differential/Platelet, COMPLETE METABOLIC PANEL WITH GFR, Urinalysis, Routine w reflex microscopic, Sedimentation rate, ANA, Anti-scleroderma antibody, RNP Antibody, Anti-Smith antibody, Sjogrens syndrome-A extractable nuclear antibody, Sjogrens syndrome-B extractable nuclear antibody, Anti-DNA antibody, double-stranded, C3 and C4, Beta-2 glycoprotein antibodies, Cardiolipin antibodies, IgG, IgM, IgA, Lupus Anticoagulant Eval w/Reflex  Polyarthralgia-she complains of  pain in multiple joints since 2006.  She states the pain has been worse since she had COVID-19 shot.  Chronic pain of both shoulders -she has had painful limited range of motion of her shoulder joint since 2006.  She states she has difficulty curling her hair.  Plan: XR Shoulder Left, XR Shoulder Right.  X-ray showed mild osteoarthritic changes.  Pain in both hands -she complains of pain and discomfort in her bilateral hands.  She had tenderness over bilateral wrist joints, MCPs and PIPs.  Plan: XR Hand 2 View Right, XR Hand 2 View Left, x-rays were consistent with osteoarthritis.  Callus formation was noted in the third metacarpal of the right hand.  Rheumatoid factor, Cyclic citrul peptide antibody, IgG, Uric acid  Bilateral hip pain-patient complains of pain and discomfort in her bilateral hips.  She requested x-rays of bilateral hips.  The x-ray of bilateral hips were unremarkable.  She had bilateral trochanteric bursitis which I believe is the source of her pain.  Chronic pain of both knees -she complains of pain and  discomfort in her bilateral knee joints.  No warmth swelling or effusion was noted.  Plan: XR KNEE 3 VIEW RIGHT, XR KNEE 3 VIEW LEFT bilateral severe chondromalacia patella was noted.  Left knee showed severe osteoarthritis.  Mild osteoarthritis was noted in the right knee.  Pain in both feet -she had tenderness over MTPs and PIPs.  Plan: XR Foot 2 Views Right, XR Foot 2 Views Left.  X-ray showed bilateral osteoarthritis.  Callus formation was noted in the second metatarsal.  Lower back pain, chronic without sciatica-patient complains of severe lower back pain.  She had lumbar scoliosis on my examination.  She had significant scoliosis on the lumbar spine x-rays.  She also had multilevel severe spondylosis and facet joint arthropathy.  Joint stiffness-she complains of significant joint stiffness.  Other fatigue - Plan: Serum protein electrophoresis with reflex, Glucose 6 phosphate dehydrogenase  Chronic pain syndrome-according to patient for many years she has had chronic pain.  History of COVID-19 - 02/26/19  Family history of rheumatoid arthritis - Sister  Essential hypertension  History of hyperlipidemia  Cerebrovascular accident (CVA) due to thrombosis of precerebral artery (HCC) - 2018  Diabetes mellitus with complication (HCC)  Neuropathy  History of uterine cancer  Osteoporosis screening-patient states she has had bone density on a regular basis and her bone density has been normal.  I do notice callus formation in her right third metacarpal and right second metatarsal.  Orders: Orders Placed This Encounter  Procedures   XR Hand 2 View Right   XR Hand 2 View Left   XR KNEE 3 VIEW RIGHT   XR KNEE 3 VIEW LEFT   XR Foot 2 Views Right   XR Foot 2 Views Left   XR Shoulder Left   XR Shoulder Right   XR Lumbar Spine 2-3 Views   XR HIPS BILAT W OR W/O PELVIS 3-4 VIEWS   CBC with Differential/Platelet   COMPLETE METABOLIC PANEL WITH GFR   Urinalysis, Routine w  reflex microscopic   Sedimentation rate   ANA   Anti-scleroderma antibody   RNP Antibody   Anti-Smith antibody   Sjogrens syndrome-A extractable nuclear antibody   Sjogrens syndrome-B extractable nuclear antibody   Anti-DNA antibody, double-stranded   C3 and C4   Beta-2 glycoprotein antibodies   Cardiolipin antibodies, IgG, IgM, IgA   Lupus Anticoagulant Eval w/Reflex   Rheumatoid factor   Cyclic citrul peptide antibody, IgG  Uric acid   Serum protein electrophoresis with reflex   Glucose 6 phosphate dehydrogenase   No orders of the defined types were placed in this encounter.     Follow-Up Instructions: Return for Pain in multiple joints, positive ANA.   Pollyann Savoy, MD  Note - This record has been created using Animal nutritionist.  Chart creation errors have been sought, but may not always  have been located. Such creation errors do not reflect on  the standard of medical care.

## 2019-09-13 ENCOUNTER — Ambulatory Visit: Payer: Self-pay

## 2019-09-13 ENCOUNTER — Ambulatory Visit (INDEPENDENT_AMBULATORY_CARE_PROVIDER_SITE_OTHER): Payer: Medicare Other | Admitting: Rheumatology

## 2019-09-13 ENCOUNTER — Encounter: Payer: Self-pay | Admitting: Rheumatology

## 2019-09-13 ENCOUNTER — Other Ambulatory Visit: Payer: Self-pay

## 2019-09-13 VITALS — BP 137/83 | HR 71 | Resp 15 | Ht 63.0 in | Wt 177.0 lb

## 2019-09-13 DIAGNOSIS — M545 Low back pain, unspecified: Secondary | ICD-10-CM

## 2019-09-13 DIAGNOSIS — R7689 Other specified abnormal immunological findings in serum: Secondary | ICD-10-CM

## 2019-09-13 DIAGNOSIS — M79671 Pain in right foot: Secondary | ICD-10-CM | POA: Diagnosis not present

## 2019-09-13 DIAGNOSIS — M25561 Pain in right knee: Secondary | ICD-10-CM | POA: Diagnosis not present

## 2019-09-13 DIAGNOSIS — M25562 Pain in left knee: Secondary | ICD-10-CM | POA: Diagnosis not present

## 2019-09-13 DIAGNOSIS — M2550 Pain in unspecified joint: Secondary | ICD-10-CM

## 2019-09-13 DIAGNOSIS — M2560 Stiffness of unspecified joint, not elsewhere classified: Secondary | ICD-10-CM

## 2019-09-13 DIAGNOSIS — M79672 Pain in left foot: Secondary | ICD-10-CM

## 2019-09-13 DIAGNOSIS — Z8616 Personal history of COVID-19: Secondary | ICD-10-CM

## 2019-09-13 DIAGNOSIS — M25511 Pain in right shoulder: Secondary | ICD-10-CM

## 2019-09-13 DIAGNOSIS — M25512 Pain in left shoulder: Secondary | ICD-10-CM | POA: Diagnosis not present

## 2019-09-13 DIAGNOSIS — M25551 Pain in right hip: Secondary | ICD-10-CM

## 2019-09-13 DIAGNOSIS — R768 Other specified abnormal immunological findings in serum: Secondary | ICD-10-CM

## 2019-09-13 DIAGNOSIS — Z8639 Personal history of other endocrine, nutritional and metabolic disease: Secondary | ICD-10-CM

## 2019-09-13 DIAGNOSIS — I6300 Cerebral infarction due to thrombosis of unspecified precerebral artery: Secondary | ICD-10-CM

## 2019-09-13 DIAGNOSIS — M25552 Pain in left hip: Secondary | ICD-10-CM | POA: Diagnosis not present

## 2019-09-13 DIAGNOSIS — G894 Chronic pain syndrome: Secondary | ICD-10-CM

## 2019-09-13 DIAGNOSIS — M79641 Pain in right hand: Secondary | ICD-10-CM

## 2019-09-13 DIAGNOSIS — I10 Essential (primary) hypertension: Secondary | ICD-10-CM

## 2019-09-13 DIAGNOSIS — Z8542 Personal history of malignant neoplasm of other parts of uterus: Secondary | ICD-10-CM

## 2019-09-13 DIAGNOSIS — E118 Type 2 diabetes mellitus with unspecified complications: Secondary | ICD-10-CM

## 2019-09-13 DIAGNOSIS — Z8261 Family history of arthritis: Secondary | ICD-10-CM

## 2019-09-13 DIAGNOSIS — G8929 Other chronic pain: Secondary | ICD-10-CM

## 2019-09-13 DIAGNOSIS — M79642 Pain in left hand: Secondary | ICD-10-CM | POA: Diagnosis not present

## 2019-09-13 DIAGNOSIS — R5383 Other fatigue: Secondary | ICD-10-CM

## 2019-09-13 DIAGNOSIS — G629 Polyneuropathy, unspecified: Secondary | ICD-10-CM

## 2019-09-17 LAB — COMPLETE METABOLIC PANEL WITH GFR
AG Ratio: 1.5 (calc) (ref 1.0–2.5)
ALT: 27 U/L (ref 6–29)
AST: 23 U/L (ref 10–35)
Albumin: 4 g/dL (ref 3.6–5.1)
Alkaline phosphatase (APISO): 72 U/L (ref 37–153)
BUN: 22 mg/dL (ref 7–25)
CO2: 26 mmol/L (ref 20–32)
Calcium: 9.2 mg/dL (ref 8.6–10.4)
Chloride: 103 mmol/L (ref 98–110)
Creat: 0.72 mg/dL (ref 0.60–0.93)
GFR, Est African American: 94 mL/min/{1.73_m2} (ref >=60)
GFR, Est Non African American: 81 mL/min/{1.73_m2} (ref >=60)
Globulin: 2.6 g/dL (calc) (ref 1.9–3.7)
Glucose, Bld: 230 mg/dL — ABNORMAL HIGH (ref 65–99)
Potassium: 4.5 mmol/L (ref 3.5–5.3)
Sodium: 137 mmol/L (ref 135–146)
Total Bilirubin: 0.4 mg/dL (ref 0.2–1.2)
Total Protein: 6.6 g/dL (ref 6.1–8.1)

## 2019-09-17 LAB — SJOGRENS SYNDROME-B EXTRACTABLE NUCLEAR ANTIBODY: SSB (La) (ENA) Antibody, IgG: <1 AI

## 2019-09-17 LAB — C3 AND C4
C3 Complement: 145 mg/dL (ref 83–193)
C4 Complement: 28 mg/dL (ref 15–57)

## 2019-09-17 LAB — LUPUS ANTICOAGULANT EVAL W/ REFLEX
PTT-LA Screen: 33 s (ref <=40)
dRVVT: 44 s (ref <=45)

## 2019-09-17 LAB — URINALYSIS, ROUTINE W REFLEX MICROSCOPIC
Bacteria, UA: NONE SEEN /HPF
Bilirubin Urine: NEGATIVE
Hgb urine dipstick: NEGATIVE
Hyaline Cast: NONE SEEN /LPF
Ketones, ur: NEGATIVE
Leukocytes,Ua: NEGATIVE
Nitrite: NEGATIVE
RBC / HPF: NONE SEEN /HPF (ref 0–2)
Specific Gravity, Urine: 1.012 (ref 1.001–1.03)
WBC, UA: NONE SEEN /HPF (ref 0–5)
pH: 5.5 (ref 5.0–8.0)

## 2019-09-17 LAB — RHEUMATOID FACTOR: Rheumatoid fact SerPl-aCnc: <14 IU/mL (ref <14)

## 2019-09-17 LAB — CBC WITH DIFFERENTIAL/PLATELET
Absolute Monocytes: 409 cells/uL (ref 200–950)
Basophils Absolute: 59 cells/uL (ref 0–200)
Basophils Relative: 0.9 %
Eosinophils Absolute: 238 cells/uL (ref 15–500)
Eosinophils Relative: 3.6 %
HCT: 38 % (ref 35.0–45.0)
Hemoglobin: 12.4 g/dL (ref 11.7–15.5)
Lymphs Abs: 3076 cells/uL (ref 850–3900)
MCH: 29.7 pg (ref 27.0–33.0)
MCHC: 32.6 g/dL (ref 32.0–36.0)
MCV: 90.9 fL (ref 80.0–100.0)
MPV: 9.3 fL (ref 7.5–12.5)
Monocytes Relative: 6.2 %
Neutro Abs: 2818 cells/uL (ref 1500–7800)
Neutrophils Relative %: 42.7 %
Platelets: 193 10*3/uL (ref 140–400)
RBC: 4.18 10*6/uL (ref 3.80–5.10)
RDW: 13.1 % (ref 11.0–15.0)
Total Lymphocyte: 46.6 %
WBC: 6.6 10*3/uL (ref 3.8–10.8)

## 2019-09-17 LAB — PROTEIN ELECTROPHORESIS, SERUM, WITH REFLEX
Albumin ELP: 4.1 g/dL (ref 3.8–4.8)
Alpha 1: 0.3 g/dL (ref 0.2–0.3)
Alpha 2: 0.9 g/dL (ref 0.5–0.9)
Beta 2: 0.3 g/dL (ref 0.2–0.5)
Beta Globulin: 0.5 g/dL (ref 0.4–0.6)
Gamma Globulin: 1.2 g/dL (ref 0.8–1.7)
Total Protein: 7.2 g/dL (ref 6.1–8.1)

## 2019-09-17 LAB — BETA-2 GLYCOPROTEIN ANTIBODIES
Beta-2 Glyco 1 IgA: <2 U/mL
Beta-2 Glyco 1 IgM: <2 U/mL
Beta-2 Glyco I IgG: <2 U/mL

## 2019-09-17 LAB — ANTI-SMITH ANTIBODY: ENA SM Ab Ser-aCnc: <1 AI

## 2019-09-17 LAB — SJOGRENS SYNDROME-A EXTRACTABLE NUCLEAR ANTIBODY: SSA (Ro) (ENA) Antibody, IgG: <1 AI

## 2019-09-17 LAB — RNP ANTIBODY: Ribonucleic Protein(ENA) Antibody, IgG: <1 AI

## 2019-09-17 LAB — CARDIOLIPIN ANTIBODIES, IGG, IGM, IGA
Anticardiolipin IgA: <2 APL-U/mL
Anticardiolipin IgG: <2 GPL-U/mL
Anticardiolipin IgM: <2 MPL-U/mL

## 2019-09-17 LAB — ANTI-NUCLEAR AB-TITER (ANA TITER): ANA Titer 1: 1:80 {titer} — ABNORMAL HIGH

## 2019-09-17 LAB — URIC ACID: Uric Acid, Serum: 6.6 mg/dL (ref 2.5–7.0)

## 2019-09-17 LAB — ANTI-SCLERODERMA ANTIBODY: Scleroderma (Scl-70) (ENA) Antibody, IgG: <1 AI

## 2019-09-17 LAB — GLUCOSE 6 PHOSPHATE DEHYDROGENASE: G-6PDH: 18.1 U/g Hgb (ref 7.0–20.5)

## 2019-09-17 LAB — CYCLIC CITRUL PEPTIDE ANTIBODY, IGG: Cyclic Citrullin Peptide Ab: <16 UNITS

## 2019-09-17 LAB — SEDIMENTATION RATE: Sed Rate: 11 mm/h (ref 0–30)

## 2019-09-17 LAB — ANA: Anti Nuclear Antibody (ANA): POSITIVE — AB

## 2019-09-17 LAB — ANTI-DNA ANTIBODY, DOUBLE-STRANDED: ds DNA Ab: <1 IU/mL

## 2019-09-19 ENCOUNTER — Encounter: Payer: Self-pay | Admitting: Physician Assistant

## 2019-09-19 ENCOUNTER — Other Ambulatory Visit: Payer: Self-pay

## 2019-09-19 ENCOUNTER — Ambulatory Visit (INDEPENDENT_AMBULATORY_CARE_PROVIDER_SITE_OTHER): Payer: Medicare Other | Admitting: Physician Assistant

## 2019-09-19 VITALS — BP 146/60 | HR 75 | Ht 63.0 in | Wt 178.4 lb

## 2019-09-19 DIAGNOSIS — E785 Hyperlipidemia, unspecified: Secondary | ICD-10-CM | POA: Diagnosis not present

## 2019-09-19 DIAGNOSIS — Z8673 Personal history of transient ischemic attack (TIA), and cerebral infarction without residual deficits: Secondary | ICD-10-CM

## 2019-09-19 DIAGNOSIS — R768 Other specified abnormal immunological findings in serum: Secondary | ICD-10-CM

## 2019-09-19 DIAGNOSIS — I10 Essential (primary) hypertension: Secondary | ICD-10-CM | POA: Diagnosis not present

## 2019-09-19 DIAGNOSIS — I6300 Cerebral infarction due to thrombosis of unspecified precerebral artery: Secondary | ICD-10-CM

## 2019-09-19 DIAGNOSIS — R002 Palpitations: Secondary | ICD-10-CM | POA: Diagnosis not present

## 2019-09-19 DIAGNOSIS — E119 Type 2 diabetes mellitus without complications: Secondary | ICD-10-CM

## 2019-09-19 NOTE — Patient Instructions (Signed)
Medication Instructions:  Per Wynema Birch- okay to take extra (1/2) tablet of metop as needed for palpitations. *If you need a refill on your cardiac medications before your next appointment, please call your pharmacy*   Lab Work: None Ordered At This Time.   If you have labs (blood work) drawn today and your tests are completely normal, you will receive your results only by: Marland Kitchen MyChart Message (if you have MyChart) OR . A paper copy in the mail If you have any lab test that is abnormal or we need to change your treatment, we will call you to review the results.   Testing/Procedures: None Ordered At This Time.     Follow-Up: At [facility], you and your health needs are our priority.  As part of our continuing mission to provide you with exceptional heart care, we have created designated Provider Care Teams.  These Care Teams include your primary Cardiologist (physician) and Advanced Practice Providers (APPs -  Physician Assistants and Nurse Practitioners) who all work together to provide you with the care you need, when you need it.  We recommend signing up for the patient portal called "MyChart".  Sign up information is provided on this After Visit Summary.  MyChart is used to connect with patients for Virtual Visits (Telemedicine).  Patients are able to view lab/test results, encounter notes, upcoming appointments, etc.  Non-urgent messages can be sent to your provider as well.   To learn more about what you can do with MyChart, go to [email or web address].    Your next appointment:   3 month(s)  The format for your next appointment:   In Person  Provider:   Nanetta Batty, MD   Other Instructions

## 2019-09-19 NOTE — Progress Notes (Signed)
Cardiology Office Note:    Date:  09/19/2019   ID:  Jill Howard, DOB [birth year], MRN [id number]  PCP:  Drosinis, Leonia Reader, PA-C  [facility] Cardiologist:  Nanetta Batty, MD  [facility] Electrophysiologist:  None   Referring MD: Drosinis, Leonia Reader, PA-C   Chief Complaint  Patient presents with  . Follow-up    seen for Dr. Allyson Sabal    History of Present Illness:    De Libman is a 79 y.o. female with a hx of hypertension, hyperlipidemia, fibromyalgia, vascular dementia,DM2 and history of CVA in August 2018. She never had a heart attack in the past. Ziopatch placed in February 2020 showed heart rate ranges between 55-142 averaging 83, sinus rhythm with short runs of SVT up to 5 beats was isolated supraventricular extra beats. There was no atrial fibrillation noted. She admits to drink up to 4 cups of coffee a day, she was instructedto cut back on caffeine.  I previously saw the patient in June 2020 for palpitation, metoprolol tartrate 12.5 mg twice a day was added for rate control.  Unfortunately patient was diagnosed with COVID-19 in February.  She was seen in the ED on 03/01/2019.  CTA showed no PE, however does demonstrate pneumonia likely related to Covid.  She was treated with IV fluid and was subsequently discharged home.  Since then, she has received a Moderna COVID 19 vaccine in May.  After receiving the vaccine, she started having increased palpitation.  Her beta-blocker was last increased to 25 mg BID by Dr. Allyson Sabal in May 2021, she returns today for reevaluation.  Talking with the patient today, she continued to have intermittent palpitation that may last up to 15 minutes.  Symptoms spontaneously occur and does not associated with physical activity.  She says she had quite significant side effect after the second Moderna vaccine and has not fully recovered at this time.  Recent increase of palpitation likely is related to a combination of severe back pain and joint pain along  with her recent side effect associated Moderna.  She has been seen by rheumatology service and has a recent lab work that showed positive ANA.  She has upcoming visit with Dr. Mertie Clause.  I suspect her palpitations will begin to settle down in the next few weeks.  I did recommend her to continue on the current therapy and may take extra dose of 12.5 mg metoprolol on as-needed basis.  Otherwise she can follow-up with Dr. Allyson Sabal in 3 to 4 months.  She denies any recent chest pain worsening shortness of breath.    Past Medical History:  Diagnosis Date  . Diabetes mellitus without complication (HCC)   . Hypertension   . Kidney stone   . Stroke (HCC) 10/24/1996  . Vertigo     Past Surgical History:  Procedure Laterality Date  . ABDOMINAL HYSTERECTOMY    . KIDNEY STONE SURGERY      Current Medications: No outpatient medications have been marked as taking for the 09/19/19 encounter (Office Visit) with Azalee Course, PA.     Allergies:   Influenza vaccines, Sitagliptin, Guaifenesin, Latex, Nitrofurantoin, Other, Phenylephrine-pheniramine-dm, Ultram [tramadol hcl], and Escitalopram oxalate   Social History   Socioeconomic History  . Marital status: Married    Spouse name: Not on file  . Number of children: Not on file  . Years of education: Not on file  . Highest education level: Not on file  Occupational History  . Not on file  Tobacco Use  . Smoking  status: Former Smoker    Quit date: 1983    Years since quitting: 38.6  . Smokeless tobacco: Never Used  Vaping Use  . Vaping Use: Never used  Substance and Sexual Activity  . Alcohol use: No  . Drug use: No  . Sexual activity: Not on file  Other Topics Concern  . Not on file  Social History Narrative  . Not on file   Social Determinants of Health   Financial Resource Strain:   . Difficulty of Paying Living Expenses: Not on file  Food Insecurity:   . Worried About Programme researcher, broadcasting/film/videounning Out of Food in the Last Year: Not on file  . Ran Out of  Food in the Last Year: Not on file  Transportation Needs:   . Lack of Transportation (Medical): Not on file  . Lack of Transportation (Non-Medical): Not on file  Physical Activity:   . Days of Exercise per Week: Not on file  . Minutes of Exercise per Session: Not on file  Stress:   . Feeling of Stress : Not on file  Social Connections:   . Frequency of Communication with Friends and Family: Not on file  . Frequency of Social Gatherings with Friends and Family: Not on file  . Attends Religious Services: Not on file  . Active Member of Clubs or Organizations: Not on file  . Attends BankerClub or Organization Meetings: Not on file  . Marital Status: Not on file     Family History: The patient's family history includes COPD in her brother; Cancer in an other family member; Diabetes in her daughter, son, and another family member; Heart attack in her brother, mother, and another family member; Heart disease in her son; Rheum arthritis in her paternal aunt and paternal aunt; Stroke in her brother, maternal aunt, mother, paternal aunt, and another family member.  ROS:   Please see the history of present illness.     All other systems reviewed and are negative.  EKGs/Labs/Other Studies Reviewed:    The following studies were reviewed today:  Echo 03/24/2017 LV EF: 60% -  65%   -------------------------------------------------------------------  Indications:   TIA 435.9.   -------------------------------------------------------------------  History:  PMH:  Stroke. Risk factors: Hypertension. Diabetes  mellitus.   -------------------------------------------------------------------  Study Conclusions   - Left ventricle: The cavity size was normal. There was mild  concentric hypertrophy. Systolic function was normal. The  estimated ejection fraction was in the range of 60% to 65%. Wall  motion was normal; there were no regional wall motion  abnormalities. Doppler parameters  are consistent with abnormal  left ventricular relaxation (grade 1 diastolic dysfunction).  Doppler parameters are consistent with indeterminate ventricular  filling pressure.  - Aortic valve: Transvalvular velocity was within the normal range.  There was no stenosis. There was no regurgitation. Valve area  (VTI): 2.06 cm^2. Valve area (Vmax): 1.95 cm^2. Valve area  (Vmean): 1.99 cm^2.  - Mitral valve: Transvalvular velocity was within the normal range.  There was no evidence for stenosis. There was trivial  regurgitation.  - Right ventricle: The cavity size was normal. Wall thickness was  normal. Systolic function was normal.  - Atrial septum: No defect or patent foramen ovale was identified.  - Tricuspid valve: There was trivial regurgitation.  - Pulmonary arteries: PA peak pressure: 16 mm Hg (S).    EKG:  EKG is not ordered today.    Recent Labs: 09/13/2019: ALT 27; BUN 22; Creat 0.72; Hemoglobin 12.4; Platelets 193; Potassium 4.5; Sodium  137  Recent Lipid Panel    Component Value Date/Time   CHOL 116 03/24/2017 0643   TRIG 160 (H) 03/24/2017 0643   HDL 33 (L) 03/24/2017 0643   CHOLHDL 3.5 03/24/2017 0643   VLDL 32 03/24/2017 0643   LDLCALC 51 03/24/2017 0643    Physical Exam:    VS:  BP (!) 146/60   Pulse 75   Ht 5\' 3"  (1.6 m)   Wt 178 lb 6.4 oz (80.9 kg)   BMI 31.60 kg/m     Wt Readings from Last 3 Encounters:  09/19/19 178 lb 6.4 oz (80.9 kg)  09/13/19 177 lb (80.3 kg)  06/19/19 174 lb 6.4 oz (79.1 kg)     GEN:  Well nourished, well developed in no acute distress HEENT: Normal NECK: No JVD; No carotid bruits LYMPHATICS: No lymphadenopathy CARDIAC: RRR, no murmurs, rubs, gallops RESPIRATORY:  Clear to auscultation without rales, wheezing or rhonchi  ABDOMEN: Soft, non-tender, non-distended MUSCULOSKELETAL:  No edema; No deformity  SKIN: Warm and dry NEUROLOGIC:  Alert and oriented x 3 PSYCHIATRIC:  Normal affect   ASSESSMENT:    1.  Palpitations   2. Essential hypertension   3. Hyperlipidemia LDL goal <70   4. Controlled type 2 diabetes mellitus without complication, without long-term current use of insulin (HCC)   5. H/O: CVA (cerebrovascular accident)   6. ANA positive    PLAN:    In order of problems listed above:  1. Palpitation: Palpitations increased recently after the second Moderna vaccine.  At the same time, she has been complaining of worsening backache and joint ache and recently underwent evaluation for rheumatological disorders.  I suspect once her other issues are under control, palpitation will also improve as well.  For the time being recommended continue on the current dose of metoprolol and use extra half a tablet as needed.  2. Hypertension: Blood pressure controlled  3. Hyperlipidemia: On Lipitor  4. DM2: Managed by primary care provider  5. History of CVA: Occurred in August 2018 no recent recurrence  6. Positive ANA: Has upcoming visit with rheumatology service.   Medication Adjustments/Labs and Tests Ordered: Current medicines are reviewed at length with the patient today.  Concerns regarding medicines are outlined above.  No orders of the defined types were placed in this encounter.  No orders of the defined types were placed in this encounter.   Patient Instructions  Medication Instructions:  Per September 2018- okay to take extra (1/2) tablet of metop as needed for palpitations. *If you need a refill on your cardiac medications before your next appointment, please call your pharmacy*   Lab Work: None Ordered At This Time.   If you have labs (blood work) drawn today and your tests are completely normal, you will receive your results only by: Wynema Birch MyChart Message (if you have MyChart) OR . A paper copy in the mail If you have any lab test that is abnormal or we need to change your treatment, we will call you to review the results.   Testing/Procedures: None Ordered At This Time.      Follow-Up: At [facility], you and your health needs are our priority.  As part of our continuing mission to provide you with exceptional heart care, we have created designated Provider Care Teams.  These Care Teams include your primary Cardiologist (physician) and Advanced Practice Providers (APPs -  Physician Assistants and Nurse Practitioners) who all work together to provide you with the care you need, when you  need it.  We recommend signing up for the patient portal called "MyChart".  Sign up information is provided on this After Visit Summary.  MyChart is used to connect with patients for Virtual Visits (Telemedicine).  Patients are able to view lab/test results, encounter notes, upcoming appointments, etc.  Non-urgent messages can be sent to your provider as well.   To learn more about what you can do with MyChart, go to [email or web address].    Your next appointment:   3 month(s)  The format for your next appointment:   In Person  Provider:   Nanetta Batty, MD   Other Instructions     Signed, Azalee Course, PA  09/19/2019 11:55 AM    [facility]

## 2019-10-03 NOTE — Progress Notes (Signed)
Office Visit Note  Patient: Jill Howard             Date of Birth: [birth year]           MRN: [id number]             PCP: Drosinis, Leonia Reader, PA-C Referring: Drosinis, Leonia Reader, PA-C Visit Date: 10/09/2019 Occupation: @GUAROCC @  Subjective:  Pain in multiple joints   History of Present Illness: Jill Howard is a 79 y.o. female with inflammatory arthritis and osteoarthritis overlap.  She states she continues to have a lot of pain and discomfort in her both shoulders, both wrist joints, both hands, knee joints and her ankles.  She states the pain is severe enough and is not controlled by over-the-counter medications.  She is has lot of a stiffness and discomfort during routine activities.  She used to be very active.  Activities of Daily Living:  Patient reports morning stiffness for 2 hours.   Patient Reports nocturnal pain.  Difficulty dressing/grooming: Reports Difficulty climbing stairs: Reports Difficulty getting out of chair: Reports Difficulty using hands for taps, buttons, cutlery, and/or writing: Reports  Review of Systems  Constitutional: Positive for fatigue.  HENT: Positive for mouth dryness and nose dryness. Negative for mouth sores.   Eyes: Positive for visual disturbance and dryness. Negative for pain.  Respiratory: Negative for shortness of breath and difficulty breathing.   Cardiovascular: Positive for swelling in legs/feet. Negative for chest pain and palpitations.  Gastrointestinal: Positive for constipation. Negative for diarrhea.  Endocrine: Negative for increased urination.  Genitourinary: Negative for difficulty urinating.  Musculoskeletal: Positive for arthralgias, joint pain, joint swelling, myalgias, muscle weakness, morning stiffness, muscle tenderness and myalgias.  Skin: Positive for redness. Negative for color change and rash.  Allergic/Immunologic: Negative for susceptible to infections.  Neurological: Positive for dizziness, numbness, headaches and  memory loss.  Hematological: Positive for bruising/bleeding tendency.  Psychiatric/Behavioral: Negative for confusion and sleep disturbance.    PMFS History:  Patient Active Problem List   Diagnosis Date Noted  . Palpitations 07/11/2018  . TIA (transient ischemic attack) 06/13/2017  . Intractable headache   . Hyperlipidemia   . History of stroke   . Neurological symptoms 03/23/2017  . CVA (cerebral vascular accident) (HCC) 10/25/2016  . 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 Essential hypertension 10/24/2016  . Diabetes mellitus with complication (HCC) 10/24/2016  . Chronic pain 10/24/2016    Past Medical History:  Diagnosis Date  . Diabetes mellitus without complication (HCC)   . Gallstones   . Hypertension   . Kidney stone   . Kidney stone   . Stroke (HCC) 10/24/1996  . Vertigo     Family History  Problem Relation Age of Onset  . Stroke Other   . Heart attack Other   . Cancer Other   . Diabetes Other   . Stroke Mother   . Heart attack Mother   . Stroke Maternal Aunt   . Stroke Paternal Aunt   . Rheum arthritis Paternal Aunt   . Stroke Brother   . Heart attack Brother   . COPD Brother   . Rheum arthritis Paternal Aunt   . Diabetes Son   . Heart disease Son   . Diabetes Daughter    Past Surgical History:  Procedure Laterality Date  . ABDOMINAL HYSTERECTOMY    . KIDNEY STONE SURGERY     Social History   Social History Narrative  . Not on file   Immunization History  Administered Date(s) Administered  .  Moderna SARS-COVID-2 Vaccination 04/25/2019, 06/03/2019     Objective: Vital Signs: BP (!) 151/72 (BP Location: Left Arm, Patient Position: Sitting, Cuff Size: Small)   Pulse 77   Resp 14   Ht 5' 3.5" (1.613 m)   Wt 176 lb 3.2 oz (79.9 kg)   BMI 30.72 kg/m    Physical Exam Vitals and nursing note reviewed.  Constitutional:      Appearance: She is well-developed.  HENT:     Head: Normocephalic and atraumatic.  Eyes:     Conjunctiva/sclera:  Conjunctivae normal.  Cardiovascular:     Rate and Rhythm: Normal rate and regular rhythm.     Heart sounds: Normal heart sounds.  Pulmonary:     Effort: Pulmonary effort is normal.     Breath sounds: Normal breath sounds.  Abdominal:     General: Bowel sounds are normal.     Palpations: Abdomen is soft.  Musculoskeletal:     Cervical back: Normal range of motion.  Lymphadenopathy:     Cervical: No cervical adenopathy.  Skin:    General: Skin is warm and dry.     Capillary Refill: Capillary refill takes less than 2 seconds.  Neurological:     Mental Status: She is alert and oriented to person, place, and time.  Psychiatric:        Behavior: Behavior normal.      Musculoskeletal Exam: C-spine was in good range of motion.  She has painful range of motion of bilateral shoulder joints.  She has synovitis over bilateral wrist joints, MCP joints.  She has warmth and swelling in her knee joints especially her left knee.  She is swelling over left ankle joint.  There was no tenderness across MTPs.  CDAI Exam: CDAI Score: 19  Patient Global: 5 mm; Provider Global: 5 mm Swollen: 9 ; Tender: 12  Joint Exam 10/09/2019      Right  Left  Glenohumeral   Tender   Tender  Wrist  Swollen Tender  Swollen Tender  MCP 2  Swollen Tender  Swollen Tender  MCP 3  Swollen Tender  Swollen Tender  Lumbar Spine   Tender     Knee  Swollen Tender  Swollen Tender  Ankle     Swollen Tender     Investigation: No additional findings.  Imaging: XR HIPS BILAT W OR W/O PELVIS 3-4 VIEWS  Result Date: 09/13/2019 No femoral acetabular joint narrowing was noted.  Bilateral hip joints were within normal limits.  No SI joint narrowing was noted. Impression: Unremarkable x-ray of the hip joints.  XR Foot 2 Views Left  Result Date: 09/13/2019 First MTP, PIP and DIP narrowing was noted.  No intertarsal, tibiotalar or subtalar joint space narrowing was noted.  Inferior and posterior calcaneal spurs were noted.   No erosive changes were noted. Impression: These findings are consistent with osteoarthritis of the foot.  XR Foot 2 Views Right  Result Date: 09/13/2019 Right first MTP severe narrowing and valgus deformity was noted.  PIP and DIP narrowing was noted.  Callus formation was noted in the second metatarsal.  Dorsal spurring was noted.  No intertarsal, tibiotalar or subtalar joint space narrowing was noted.  Posterior calcaneal spur was noted. Impression: These findings are consistent with osteoarthritis of the foot.  Callus formation in the second metatarsal was noted.  XR Hand 2 View Left  Result Date: 09/13/2019 CMC, PIP and DIP narrowing was noted.  No MCP, intercarpal radiocarpal joint space narrowing was noted.  No  erosive changes were noted. Impression: These findings are consistent with osteoarthritis of the hand.  XR Hand 2 View Right  Result Date: 09/13/2019 CMC, PIP and DIP narrowing was noted.  No MCP, intercarpal radiocarpal joint space narrowing was noted.  Possible callus was noted in the third metacarpal.  No erosive changes were noted. 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  XR KNEE 3 VIEW LEFT  Result Date: 09/13/2019 Severe medial compartment narrowing with medial osteophytes were noted.  Severe patellofemoral narrowing was noted. 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  XR KNEE 3 VIEW RIGHT  Result Date: 09/13/2019 Mild medial compartment narrowing of the knee joint was noted.  Lateral osteophytes were noted.  Intercondylar osteophytes are noted.  Severe patellofemoral narrowing was noted. Impression: These findings were consistent with mild osteoarthritis and severe chondromalacia patella.  XR Lumbar Spine 2-3 Views  Result Date: 09/13/2019 Severe levoscoliosis of lumbar spine was noted.  Multilevel spondylosis was noted.  Facet joint arthropathy was noted.  Severe narrowing  was noted between L1 and L2.  Calcification of blood vessels was noted. Impression: These findings are consistent with severe scoliosis, multilevel spondylosis and facet joint arthropathy..  XR Shoulder Left  Result Date: 09/13/2019 No glenohumeral joint space narrowing was noted.  Spurring of the greater tubercle was noted.  No acromioclavicular joint space narrowing was noted.  No chondrocalcinosis was noted. Impression: Some osteoarthritic changes were noted in the shoulder joint.  XR Shoulder Right  Result Date: 09/13/2019 No glenohumeral or acromioclavicular joint space narrowing was noted.  No chondrocalcinosis was noted. Impression: Unremarkable x-ray of the shoulder joint.   Recent Labs: Lab Results  Component Value Date   WBC 6.6 09/13/2019   HGB 12.4 09/13/2019   PLT 193 09/13/2019   NA 137 09/13/2019   K 4.5 09/13/2019   CL 103 09/13/2019   CO2 26 09/13/2019   GLUCOSE 230 (H) 09/13/2019   BUN 22 09/13/2019   CREATININE 0.72 09/13/2019   BILITOT 0.4 09/13/2019   ALKPHOS 68 03/01/2019   AST 23 09/13/2019   ALT 27 09/13/2019   PROT 6.6 09/13/2019   PROT 7.2 09/13/2019   ALBUMIN 3.0 (L) 03/01/2019   CALCIUM 9.2 09/13/2019   GFRAA 94 09/13/2019   UA showed trace protein, SPEP normal, G6PD normal, Lupus anticoagulant negative, beta-2 negative, anticardiolipin negative, C3-C4 normal, ANA positive no titer available, ENA negative, RF negative, anti-CCP negative, uric acid 6.6  Speciality Comments: No specialty comments available.=  Procedures:  No procedures performed Allergies: Influenza vaccines, Sitagliptin, Guaifenesin, Latex, Nitrofurantoin, Other, Phenylephrine-pheniramine-dm, Ultram [tramadol hcl], and Escitalopram oxalate   Assessment / Plan:     Visit Diagnoses: Seronegative rheumatoid arthritis (HCC) - ANA 1: 320NH, ENA negative, C3-C4 normal, no clinical features of lupus.  I detailed discussion with patient regarding inflammatory arthritis and most likely  diagnosis of rheumatoid arthritis.  Indications side effects contraindications of different medications were discussed.  She will need more aggressive therapy.  We discussed starting on hydroxychloroquine.  The plan is to start her on hydroxychloroquine 200 mg p.o. twice daily Monday through Friday.  If she has an adequate response the methotrexate could be added.  She is hesitant to go on prednisone taper as she is diabetic.  Patient was counseled on the purpose, proper use, and adverse effects of hydroxychloroquine including nausea/diarrhea, skin rash, headaches, and sun sensitivity.  Discussed importance of annual eye exams while on hydroxychloroquine to monitor to ocular toxicity  and discussed importance of frequent laboratory monitoring.  Provided patient with eye exam form for baseline ophthalmologic exam.  Provided patient with educational materials on hydroxychloroquine and answered all questions.  Patient consented to hydroxychloroquine.  Will upload consent in the media tab.    Dose will be Plaquenil 200 mg twice daily Monday through Friday.  Prescription pending lab results.   High risk medication use-she will be starting hydroxychloroquine.  She will need baseline examination and then eye examination on a yearly basis.  Polyarthralgia - History of chronic pain for 15 years.  Pain has been worse since the COVID-19 shot.  Chronic pain of both shoulders - History of limited range of motion of bilateral shoulders since 2006.  X-ray showed mild osteoarthritic changes.  Primary osteoarthritis of both hands - Clinical and radiographic findings are consistent with osteoarthritis.  Callus formation was noted in the right third metacarpal.  Patient gives history of past injury.  She has synovitis on bilateral wrist joints and MCPs as described above.  Bilateral hip pain - X-rays obtained at the last visit were unremarkable.  She continues to have discomfort in her hips probably due to  inflammation.  Primary osteoarthritis of both knees -she had warmth on palpation of her knee joints.  Left knee joint mild osteoarthritis, right knee joint severe osteoarthritis, bilateral severe chondromalacia patella  Primary osteoarthritis of both feet - Callus formation was noted in the right second metatarsal.  Patient gives history of injury in the past.  DDD (degenerative disc disease), lumbar - Multilevel spondylosis and facet joint arthropathy and scoliosis.  She has chronic lower back pain.  Chronic pain syndrome  Other fatigue  History of COVID-19 - February 26, 2019.   Family history of rheumatoid arthritis  History of hyperlipidemia  Essential hypertension  Cerebrovascular accident (CVA) due to thrombosis of precerebral artery (HCC)  Diabetes mellitus with complication (HCC)  Neuropathy  History of uterine cancer  Educated about COVID-19 virus infection - Fully vaccinated.  She will need booster when available.  Use of mask, social distancing and hand hygiene discussed.  Orders: No orders of the defined types were placed in this encounter.  No orders of the defined types were placed in this encounter.     Follow-Up Instructions: Return in about 6 weeks (around 11/20/2019) for Osteoarthritis, Rheumatoid arthritis.   Pollyann Savoy, MD  Note - This record has been created using Animal nutritionist.  Chart creation errors have been sought, but may not always  have been located. Such creation errors do not reflect on  the standard of medical care.

## 2019-10-07 ENCOUNTER — Encounter: Payer: Self-pay | Admitting: *Deleted

## 2019-10-07 DIAGNOSIS — Z006 Encounter for examination for normal comparison and control in clinical research program: Secondary | ICD-10-CM

## 2019-10-07 NOTE — Research (Signed)
50M Stroke AF visit  Patient doing well, had COVID back in Feb 2021. Received the Moderna vaccine in April and May of 2021. After the vaccine she had increase palpitations. Saw cardiology and they increased her metoprolol to 25mg  bid. Follow up with cardiology continues to have palpitations and they told her she could take 12.5 mg as needed. Patient said she is feeling ok. Having some arthritis issues but being checked out for that.  Thanked patient for participating in the stroke af study.    Current Outpatient Medications:  .  amLODipine (NORVASC) 10 MG tablet, Take 5 mg by mouth daily. , Disp: , Rfl:  .  aspirin 325 MG tablet, Take 325 mg by mouth daily., Disp: , Rfl:  .  atorvastatin (LIPITOR) 80 MG tablet, Take 1 tablet (80 mg total) by mouth at bedtime., Disp: 30 tablet, Rfl: 3 .  clopidogrel (PLAVIX) 75 MG tablet, Take 1 tablet (75 mg total) by mouth daily., Disp: 30 tablet, Rfl: 4 .  Cyanocobalamin (VITAMIN B-12 PO), Take 5,000 mg by mouth daily., Disp: , Rfl:  .  diclofenac sodium (VOLTAREN) 1 % GEL, Apply 2 g topically as needed. , Disp: , Rfl:  .  diphenhydrAMINE (BENADRYL) 25 MG tablet, Take 25 mg by mouth at bedtime as needed., Disp: , Rfl:  .  Dulaglutide (TRULICITY) 1.5 MG/0.5ML SOPN, Inject 1.5 mg into the skin once a week. On Tuesday, Disp: , Rfl:  .  glimepiride (AMARYL) 4 MG tablet, Take 4 mg by mouth 2 (two) times daily., Disp: , Rfl:  .  Insulin Glargine (TOUJEO SOLOSTAR [place]), Inject 45-50 Units into the skin at bedtime. , Disp: , Rfl:  .  lisinopril (PRINIVIL,ZESTRIL) 20 MG tablet, Take 20 mg by mouth daily. Take 1 tablet by mouth in the mornings, Disp: , Rfl:  .  Loratadine (CLARITIN PO), Take 1 tablet by mouth daily as needed. , Disp: , Rfl:  .  Magnesium 250 MG TABS, Take 250 mg by mouth daily., Disp: , Rfl:  .  metFORMIN (GLUCOPHAGE) 1000 MG tablet, Take 1,000 mg by mouth 2 (two) times daily with a meal., Disp: , Rfl:  .  metoprolol tartrate (LOPRESSOR) 25 MG tablet,  Take 1 tablet (25 mg total) by mouth 2 (two) times daily., Disp: 180 tablet, Rfl: 3 .  Multiple Vitamin (MULTIVITAMIN PO), Take by mouth daily., Disp: , Rfl:  .  Omega-3 Fatty Acids (FISH OIL) 1000 MG CAPS, Take 1,000 mg by mouth daily. , Disp: , Rfl:  .  VITAMIN D PO, Take 2,000 mg by mouth. , Disp: , Rfl:

## 2019-10-09 ENCOUNTER — Ambulatory Visit (INDEPENDENT_AMBULATORY_CARE_PROVIDER_SITE_OTHER): Payer: Medicare Other | Admitting: Rheumatology

## 2019-10-09 ENCOUNTER — Encounter: Payer: Self-pay | Admitting: Rheumatology

## 2019-10-09 ENCOUNTER — Other Ambulatory Visit: Payer: Self-pay

## 2019-10-09 VITALS — BP 151/72 | HR 77 | Resp 14 | Ht 63.5 in | Wt 176.2 lb

## 2019-10-09 DIAGNOSIS — Z7189 Other specified counseling: Secondary | ICD-10-CM

## 2019-10-09 DIAGNOSIS — Z8261 Family history of arthritis: Secondary | ICD-10-CM

## 2019-10-09 DIAGNOSIS — E118 Type 2 diabetes mellitus with unspecified complications: Secondary | ICD-10-CM

## 2019-10-09 DIAGNOSIS — M2550 Pain in unspecified joint: Secondary | ICD-10-CM

## 2019-10-09 DIAGNOSIS — M25511 Pain in right shoulder: Secondary | ICD-10-CM

## 2019-10-09 DIAGNOSIS — M51369 Other intervertebral disc degeneration, lumbar region without mention of lumbar back pain or lower extremity pain: Secondary | ICD-10-CM

## 2019-10-09 DIAGNOSIS — Z79899 Other long term (current) drug therapy: Secondary | ICD-10-CM

## 2019-10-09 DIAGNOSIS — M19042 Primary osteoarthritis, left hand: Secondary | ICD-10-CM

## 2019-10-09 DIAGNOSIS — M19072 Primary osteoarthritis, left ankle and foot: Secondary | ICD-10-CM

## 2019-10-09 DIAGNOSIS — M19041 Primary osteoarthritis, right hand: Secondary | ICD-10-CM

## 2019-10-09 DIAGNOSIS — R5383 Other fatigue: Secondary | ICD-10-CM

## 2019-10-09 DIAGNOSIS — M5136 Other intervertebral disc degeneration, lumbar region: Secondary | ICD-10-CM

## 2019-10-09 DIAGNOSIS — G8929 Other chronic pain: Secondary | ICD-10-CM

## 2019-10-09 DIAGNOSIS — M0600 Rheumatoid arthritis without rheumatoid factor, unspecified site: Secondary | ICD-10-CM | POA: Diagnosis not present

## 2019-10-09 DIAGNOSIS — Z8639 Personal history of other endocrine, nutritional and metabolic disease: Secondary | ICD-10-CM

## 2019-10-09 DIAGNOSIS — M170 Bilateral primary osteoarthritis of knee: Secondary | ICD-10-CM

## 2019-10-09 DIAGNOSIS — I10 Essential (primary) hypertension: Secondary | ICD-10-CM

## 2019-10-09 DIAGNOSIS — G894 Chronic pain syndrome: Secondary | ICD-10-CM

## 2019-10-09 DIAGNOSIS — M25512 Pain in left shoulder: Secondary | ICD-10-CM

## 2019-10-09 DIAGNOSIS — Z8616 Personal history of COVID-19: Secondary | ICD-10-CM

## 2019-10-09 DIAGNOSIS — M25552 Pain in left hip: Secondary | ICD-10-CM

## 2019-10-09 DIAGNOSIS — M25551 Pain in right hip: Secondary | ICD-10-CM

## 2019-10-09 DIAGNOSIS — G629 Polyneuropathy, unspecified: Secondary | ICD-10-CM

## 2019-10-09 DIAGNOSIS — I6300 Cerebral infarction due to thrombosis of unspecified precerebral artery: Secondary | ICD-10-CM

## 2019-10-09 DIAGNOSIS — Z8542 Personal history of malignant neoplasm of other parts of uterus: Secondary | ICD-10-CM

## 2019-10-09 DIAGNOSIS — M19071 Primary osteoarthritis, right ankle and foot: Secondary | ICD-10-CM

## 2019-10-09 MED ORDER — HYDROXYCHLOROQUINE SULFATE 200 MG PO TABS: ORAL_TABLET | ORAL | 0 refills | Status: DC

## 2019-10-09 NOTE — Patient Instructions (Addendum)
Hydroxychloroquine tablets What is this medicine? HYDROXYCHLOROQUINE (hye drox ee KLOR oh kwin) is used to treat rheumatoid arthritis and systemic lupus erythematosus. It is also used to treat malaria. This medicine may be used for other purposes; ask your health care provider or pharmacist if you have questions. COMMON BRAND NAME(S): Plaquenil, Quineprox What should I tell my health care provider before I take this medicine? They need to know if you have any of these conditions:  diabetes  eye disease, vision problems  G6PD deficiency  heart disease  history of irregular heartbeat  if you often drink alcohol  kidney disease  liver disease  porphyria  psoriasis  an unusual or allergic reaction to chloroquine, hydroxychloroquine, other medicines, foods, dyes, or preservatives  pregnant or trying to get pregnant  breast-feeding How should I use this medicine? Take this medicine by mouth with a glass of water. Follow the directions on the prescription label. Do not cut, crush or chew this medicine. Swallow the tablets whole. Take this medicine with food. Avoid taking antacids within 4 hours of taking this medicine. It is best to separate these medicines by at least 4 hours. Take your medicine at regular intervals. Do not take it more often than directed. Take all of your medicine as directed even if you think you are better. Do not skip doses or stop your medicine early. Talk to your pediatrician regarding the use of this medicine in children. While this drug may be prescribed for selected conditions, precautions do apply. Overdosage: If you think you have taken too much of this medicine contact a poison control center or emergency room at once. NOTE: This medicine is only for you. Do not share this medicine with others. What if I miss a dose? If you miss a dose, take it as soon as you can. If it is almost time for your next dose, take only that dose. Do not take double or extra  doses. What may interact with this medicine? Do not take this medicine with any of the following medications:  cisapride  dronedarone  pimozide  thioridazine This medicine may also interact with the following medications:  ampicillin  antacids  cimetidine  cyclosporine  digoxin  kaolin  medicines for diabetes, like insulin, glipizide, glyburide  medicines for seizures like carbamazepine, phenobarbital, phenytoin  mefloquine  methotrexate  other medicines that prolong the QT interval (cause an abnormal heart rhythm)  praziquantel This list may not describe all possible interactions. Give your health care provider a list of all the medicines, herbs, non-prescription drugs, or dietary supplements you use. Also tell them if you smoke, drink alcohol, or use illegal drugs. Some items may interact with your medicine. What should I watch for while using this medicine? Visit your health care professional for regular checks on your progress. Tell your health care professional if your symptoms do not start to get better or if they get worse. You may need blood work done while you are taking this medicine. If you take other medicines that can affect heart rhythm, you may need more testing. Talk to your health care professional if you have questions. Your vision may be tested before and during use of this medicine. Tell your health care professional right away if you have any change in your eyesight. What side effects may I notice from receiving this medicine? Side effects that you should report to your doctor or health care professional as soon as possible:  allergic reactions like skin rash, itching or hives,   swelling of the face, lips, or tongue  changes in vision  decreased hearing or ringing of the ears  muscle weakness  redness, blistering, peeling or loosening of the skin, including inside the mouth  sensitivity to light  signs and symptoms of a dangerous change in  heartbeat or heart rhythm like chest pain; dizziness; fast or irregular heartbeat; palpitations; feeling faint or lightheaded, falls; breathing problems  signs and symptoms of liver injury like dark yellow or brown urine; general ill feeling or flu-like symptoms; light-colored stools; loss of appetite; nausea; right upper belly pain; unusually weak or tired; yellowing of the eyes or skin  signs and symptoms of low blood sugar such as feeling anxious; confusion; dizziness; increased hunger; unusually weak or tired; sweating; shakiness; cold; irritable; headache; blurred vision; fast heartbeat; loss of consciousness  suicidal thoughts  uncontrollable head, mouth, neck, arm, or leg movements Side effects that usually do not require medical attention (report to your doctor or health care professional if they continue or are bothersome):  diarrhea  dizziness  hair loss  headache  irritable  loss of appetite  nausea, vomiting  stomach pain This list may not describe all possible side effects. Call your doctor for medical advice about side effects. You may report side effects to FDA at 1-800-FDA-1088. Where should I keep my medicine? Keep out of the reach of children. Store at room temperature between 15 and 30 degrees C (59 and 86 degrees F). Protect from moisture and light. Throw away any unused medicine after the expiration date. NOTE: This sheet is a summary. It may not cover all possible information. If you have questions about this medicine, talk to your doctor, pharmacist, or health care provider.  2020 Elsevier/Gold Standard (2018-05-21 12:56:32)  Standing Labs We placed an order today for your standing lab work.   Please have your standing labs drawn in 1 month and then every 3 months.  If possible, please have your labs drawn 2 weeks prior to your appointment so that the provider can discuss your results at your appointment.  We have open lab daily Monday through Thursday  from 8:30-12:30 PM and 1:30-4:30 PM and Friday from 8:30-12:30 PM and 1:30-4:00 PM at the office of Dr. Pollyann Savoy, [facility].   Please be advised, patients with office appointments requiring lab work will take precedents over walk-in lab work.  If possible, please come for your lab work on Monday and Friday afternoons, as you may experience shorter wait times. The office is located at [address], [address], [place], [place] [address] No appointment is necessary.   Labs are drawn by [facility]. Please bring your co-pay at the time of your lab draw.  You may receive a bill from [facility] for your lab work.  If you wish to have your labs drawn at another location, please call the office 24 hours in advance to send orders.  If you have any questions regarding directions or hours of operation,  please call [phone].   As a reminder, please drink plenty of water prior to coming for your lab work. Thanks!   COVID-19 vaccine recommendations:   COVID-19 vaccine is recommended for everyone (unless you are allergic to a vaccine component), even if you are on a medication that suppresses your immune system.   If you are on Methotrexate, Cellcept (mycophenolate), Rinvoq, Harriette Ohara, and Olumiant- hold the medication for 1 week after each vaccine. Hold Methotrexate for 2 weeks after the single dose COVID-19 vaccine.  If you are on Orencia subcutaneous injection - hold medication one week prior to and one week after the first COVID-19 vaccine dose (only).   If you are on Orencia IV infusions- time vaccination administration so that the first COVID-19 vaccination will occur four weeks after the infusion and postpone the subsequent infusion by one week.   If you are on Cyclophosphamide or Rituxan infusions please contact your doctor prior to receiving the COVID-19 vaccine.   Do not take Tylenol or any anti-inflammatory medications (NSAIDs) 24 hours prior to the COVID-19 vaccination.    There is no direct evidence about the efficacy of the COVID-19 vaccine in individuals who are on medications that suppress the immune system.   Even if you are fully vaccinated, and you are on any medications that suppress your immune system, please continue to wear a mask, maintain at least six feet social distance and practice hand hygiene.   If you develop a COVID-19 infection, please contact your PCP or our office to determine if you need antibody infusion.  The booster vaccine is now available for immunocompromised patients. It is advised that if you had Pfizer vaccine you should get [employer] booster.  If you had a Moderna vaccine then you should get a Moderna booster. Johnson and Laural Benes does not have a booster vaccine at this time.  Please see the following web sites for updated information.   [email or web address]/[email or web address]-Vaccination-Patient-Resources.[email or web address]  [email or web address]/[email or web address]

## 2019-11-11 NOTE — Progress Notes (Deleted)
Office Visit Note  Patient: Jill Howard             Date of Birth: 1940/12/05           MRN: [id number]             PCP: Wynonia Lawman Leonia Reader, PA-C Referring: Drosinis, Leonia Reader, PA-C Visit Date: 11/25/2019 Occupation: @GUAROCC @  Subjective:  No chief complaint on file.   History of Present Illness: Jill Howard is a 79 y.o. female ***   Activities of Daily Living:  Patient reports morning stiffness for *** {minute/hour:19697}.   Patient {ACTIONS;DENIES/REPORTS:21021675::"Denies"} nocturnal pain.  Difficulty dressing/grooming: {ACTIONS;DENIES/REPORTS:21021675::"Denies"} Difficulty climbing stairs: {ACTIONS;DENIES/REPORTS:21021675::"Denies"} Difficulty getting out of chair: {ACTIONS;DENIES/REPORTS:21021675::"Denies"} Difficulty using hands for taps, buttons, cutlery, and/or writing: {ACTIONS;DENIES/REPORTS:21021675::"Denies"}  No Rheumatology ROS completed.   PMFS History:  Patient Active Problem List   Diagnosis Date Noted  . Palpitations 07/11/2018  . TIA (transient ischemic attack) 06/13/2017  . Intractable headache   . Hyperlipidemia   . History of stroke   . Neurological symptoms 03/23/2017  . CVA (cerebral vascular accident) (HCC) 10/25/2016  . 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 Essential hypertension 10/24/2016  . Diabetes mellitus with complication (HCC) 10/24/2016  . Chronic pain 10/24/2016    Past Medical History:  Diagnosis Date  . Diabetes mellitus without complication (HCC)   . Gallstones   . Hypertension   . Kidney stone   . Kidney stone   . Stroke (HCC) 10/24/1996  . Vertigo     Family History  Problem Relation Age of Onset  . Stroke Other   . Heart attack Other   . Cancer Other   . Diabetes Other   . Stroke Mother   . Heart attack Mother   . Stroke Maternal Aunt   . Stroke Paternal Aunt   . Rheum arthritis Paternal Aunt   . Stroke Brother   . Heart attack Brother   . COPD Brother   . Rheum arthritis Paternal Aunt   . Diabetes Son     . Heart disease Son   . Diabetes Daughter    Past Surgical History:  Procedure Laterality Date  . ABDOMINAL HYSTERECTOMY    . KIDNEY STONE SURGERY     Social History   Social History Narrative  . Not on file   Immunization History  Administered Date(s) Administered  . Moderna SARS-COVID-2 Vaccination 04/25/2019, 06/03/2019     Objective: Vital Signs: There were no vitals taken for this visit.   Physical Exam   Musculoskeletal Exam: ***  CDAI Exam: CDAI Score: -- Patient Global: --; Provider Global: -- Swollen: --; Tender: -- Joint Exam 11/25/2019   No joint exam has been documented for this visit   There is currently no information documented on the homunculus. Go to the Rheumatology activity and complete the homunculus joint exam.  Investigation: No additional findings.  Imaging: No results found.  Recent Labs: Lab Results  Component Value Date   WBC 6.6 09/13/2019   HGB 12.4 09/13/2019   PLT 193 09/13/2019   NA 137 09/13/2019   K 4.5 09/13/2019   CL 103 09/13/2019   CO2 26 09/13/2019   GLUCOSE 230 (H) 09/13/2019   BUN 22 09/13/2019   CREATININE 0.72 09/13/2019   BILITOT 0.4 09/13/2019   ALKPHOS 68 03/01/2019   AST 23 09/13/2019   ALT 27 09/13/2019   PROT 6.6 09/13/2019   PROT 7.2 09/13/2019   ALBUMIN 3.0 (L) 03/01/2019   CALCIUM 9.2 09/13/2019   GFRAA 94  09/13/2019    Speciality Comments: No specialty comments available.  Procedures:  No procedures performed Allergies: Influenza vaccines, Sitagliptin, Guaifenesin, Latex, Nitrofurantoin, Other, Phenylephrine-pheniramine-dm, Ultram [tramadol hcl], and Escitalopram oxalate   Assessment / Plan:     Visit Diagnoses: No diagnosis found.  Orders: No orders of the defined types were placed in this encounter.  No orders of the defined types were placed in this encounter.   Face-to-face time spent with patient was *** minutes. Greater than 50% of time was spent in counseling and coordination  of care.  Follow-Up Instructions: No follow-ups on file.   Ellen Henri, CMA  Note - This record has been created using Animal nutritionist.  Chart creation errors have been sought, but may not always  have been located. Such creation errors do not reflect on  the standard of medical care.

## 2019-11-25 ENCOUNTER — Ambulatory Visit: Payer: Medicare Other | Admitting: Rheumatology

## 2019-11-25 DIAGNOSIS — E118 Type 2 diabetes mellitus with unspecified complications: Secondary | ICD-10-CM

## 2019-11-25 DIAGNOSIS — M19071 Primary osteoarthritis, right ankle and foot: Secondary | ICD-10-CM

## 2019-11-25 DIAGNOSIS — G894 Chronic pain syndrome: Secondary | ICD-10-CM

## 2019-11-25 DIAGNOSIS — G629 Polyneuropathy, unspecified: Secondary | ICD-10-CM

## 2019-11-25 DIAGNOSIS — M5136 Other intervertebral disc degeneration, lumbar region: Secondary | ICD-10-CM

## 2019-11-25 DIAGNOSIS — M25551 Pain in right hip: Secondary | ICD-10-CM

## 2019-11-25 DIAGNOSIS — M0600 Rheumatoid arthritis without rheumatoid factor, unspecified site: Secondary | ICD-10-CM

## 2019-11-25 DIAGNOSIS — Z8542 Personal history of malignant neoplasm of other parts of uterus: Secondary | ICD-10-CM

## 2019-11-25 DIAGNOSIS — R5383 Other fatigue: Secondary | ICD-10-CM

## 2019-11-25 DIAGNOSIS — I6300 Cerebral infarction due to thrombosis of unspecified precerebral artery: Secondary | ICD-10-CM

## 2019-11-25 DIAGNOSIS — Z8261 Family history of arthritis: Secondary | ICD-10-CM

## 2019-11-25 DIAGNOSIS — G8929 Other chronic pain: Secondary | ICD-10-CM

## 2019-11-25 DIAGNOSIS — M2550 Pain in unspecified joint: Secondary | ICD-10-CM

## 2019-11-25 DIAGNOSIS — Z8639 Personal history of other endocrine, nutritional and metabolic disease: Secondary | ICD-10-CM

## 2019-11-25 DIAGNOSIS — M19041 Primary osteoarthritis, right hand: Secondary | ICD-10-CM

## 2019-11-25 DIAGNOSIS — Z79899 Other long term (current) drug therapy: Secondary | ICD-10-CM

## 2019-11-25 DIAGNOSIS — M170 Bilateral primary osteoarthritis of knee: Secondary | ICD-10-CM

## 2019-11-25 DIAGNOSIS — I10 Essential (primary) hypertension: Secondary | ICD-10-CM

## 2019-12-24 ENCOUNTER — Ambulatory Visit (INDEPENDENT_AMBULATORY_CARE_PROVIDER_SITE_OTHER): Payer: Medicare Other | Admitting: Cardiovascular Disease

## 2019-12-24 ENCOUNTER — Encounter: Payer: Self-pay | Admitting: Cardiovascular Disease

## 2019-12-24 ENCOUNTER — Other Ambulatory Visit: Payer: Self-pay

## 2019-12-24 DIAGNOSIS — E782 Mixed hyperlipidemia: Secondary | ICD-10-CM | POA: Diagnosis not present

## 2019-12-24 DIAGNOSIS — I10 Essential (primary) hypertension: Secondary | ICD-10-CM

## 2019-12-24 DIAGNOSIS — R002 Palpitations: Secondary | ICD-10-CM

## 2019-12-24 DIAGNOSIS — I6300 Cerebral infarction due to thrombosis of unspecified precerebral artery: Secondary | ICD-10-CM

## 2019-12-24 LAB — HEPATIC FUNCTION PANEL
ALT: 21 IU/L (ref 0–32)
AST: 16 IU/L (ref 0–40)
Albumin: 4 g/dL (ref 3.7–4.7)
Alkaline Phosphatase: 83 IU/L (ref 44–121)
Bilirubin Total: 0.3 mg/dL (ref 0.0–1.2)
Bilirubin, Direct: <0.1 mg/dL (ref 0.00–0.40)
Total Protein: 6.5 g/dL (ref 6.0–8.5)

## 2019-12-24 LAB — LIPID PANEL
Chol/HDL Ratio: 3.2 ratio (ref 0.0–4.4)
Cholesterol, Total: 150 mg/dL (ref 100–199)
HDL: 47 mg/dL (ref >39)
LDL Chol Calc (NIH): 83 mg/dL (ref 0–99)
Triglycerides: 109 mg/dL (ref 0–149)
VLDL Cholesterol Cal: 20 mg/dL (ref 5–40)

## 2019-12-24 MED ORDER — DILTIAZEM HCL ER COATED BEADS 120 MG PO CP24: 120.0000 mg | ORAL_CAPSULE | Freq: Every day | ORAL | 3 refills | Status: DC

## 2019-12-24 NOTE — Patient Instructions (Signed)
Medication Instructions:  Stop taking amlodipine (norvasc) 10mg , start taking diltiazem (cardizem CD) 120mg  once daily.  *If you need a refill on your cardiac medications before your next appointment, please call your pharmacy*   Lab Work: Your physician recommends that you have labs today: lipid and liver profile.  If you have labs (blood work) drawn today and your tests are completely normal, you will receive your results only by: MyChart Message (if you have MyChart) OR . A paper copy in the mail If you have any lab test that is abnormal or we need to change your treatment, we will call you to review the results.  Follow-Up: At [facility], you and your health needs are our priority.  As part of our continuing mission to provide you with exceptional heart care, we have created designated Provider Care Teams.  These Care Teams include your primary Cardiologist (physician) and Advanced Practice Providers (APPs -  Physician Assistants and Nurse Practitioners) who all work together to provide you with the care you need, when you need it.  We recommend signing up for the patient portal called "MyChart".  Sign up information is provided on this After Visit Summary.  MyChart is used to connect with patients for Virtual Visits (Telemedicine).  Patients are able to view lab/test results, encounter notes, upcoming appointments, etc.  Non-urgent messages can be sent to your provider as well.   To learn more about what you can do with MyChart, go to Marland Kitchen.    Your next appointment:   3 month(s)  The format for your next appointment:   In Person  Provider:   [facility]  Then back to see [email or web address] in 12 months.  Other Instructions Keep a blood pressure log for one month and then back to see one of our pharmacists in 1 month.

## 2019-12-24 NOTE — Assessment & Plan Note (Signed)
History of palpitations on beta-blocker.  She did have a Zio patch that showed short runs of SVT isolated PACs and PVCs.  She has cut back her caffeine from 4 cups a day to 2 cups a day with one of them being decaf.  She is on metoprolol 25 mg p.o. twice daily and continues to complain of some palpitations.  I am going to change her amlodipine to Cardizem CD 120 mg a day to see whether or not this improves the frequency and/or severity of her palpitations.

## 2019-12-24 NOTE — Assessment & Plan Note (Signed)
History of stroke 10/24/2016 on full dose aspirin per Dr. Pearlean Brownie.  This did leave her with some numbness in her right fourth and fifth fingers as well as a field cut.

## 2019-12-24 NOTE — Progress Notes (Signed)
12/24/2019 Jill Howard   1940/03/29  [id number]  Primary Physician Drosinis, Leonia Reader, PA-C Primary Cardiologist: Runell Gess MD Nicholes Calamity, [place]  HPI:  Jill Howard is a 79 y.o.  married Caucasian female mother of 2 children, grandmother of 4 grandchildren referred by Hayden Rasmussen, PA-C for cardiovascular valuation because of palpitations.I last saw her in the office  06/19/2019.Her cardiac risk factors are notable for type 2 diabetes, essential hypertension, hyperlipidemia, fibromyalgia and vascular dementia. She has had a stroke 10/24/2016 and suffers from migraine headaches as well. She does not smoke. She is never had a heart attack. She denies chest pain. She gets occasional shortness of breath when walking and has occasional lower extremity edema in her feet. 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 She apparently had an office visit with Hayden Rasmussen 03/12/2018 at which time she was complaining of upper respiratory tract symptoms and was treated with Augmentin. She was also complaining of palpitations at that time. AZO patch was placed from 03/19/2020-04/02/2018 which showed heart rates ranging from 55-1 42 averaging 83, sinus rhythm. She did have short runs of SVT up to 5 beats with isolated supraventricular extra beats and rare ventricular extra beats. There was no atrial fibrillation noted. She does admit to drinking 4 cups of coffee a day. She is not very symptomatic from these palpitations other than she notices them.  She saw HaoMeng, PA-C in the office 07/01/2018 who placed her on low-dose beta-blocker just resulted in complete resolution of her symptoms. Blood work was drawn that revealed a normal TSH. We talked about the importance of reducing her caffeine intake as well.   She did receive her second dose of the Moderna vaccine on May 10.  Since that time she is noticed increasing arthritic pain, some lower extremity edema,  and increased palpitations.  She also has noticed some increased arthritic symptoms which she attributes to her second Materna dose. She has decreased her caffeine intake from 4 cups to 2 cups of coffee a day  Since I saw her 6 months ago she continues to have arthritic type symptoms especially involving her hips.  She also continues to have palpitations despite decreasing her caffeine intake and being on a beta-blocker.   Current Meds  Medication Sig  . aspirin 325 MG tablet Take 325 mg by mouth daily.  Marland Kitchen atorvastatin (LIPITOR) 80 MG tablet Take 1 tablet (80 mg total) by mouth at bedtime.  . clopidogrel (PLAVIX) 75 MG tablet Take 1 tablet (75 mg total) by mouth daily.  . Cyanocobalamin (VITAMIN B-12 PO) Take 5,000 mg by mouth daily.  . diclofenac sodium (VOLTAREN) 1 % GEL Apply 2 g topically as needed.   . diphenhydrAMINE (BENADRYL) 25 MG tablet Take 25 mg by mouth at bedtime as needed.  . Dulaglutide (TRULICITY) 1.5 MG/0.5ML SOPN Inject 1.5 mg into the skin once a week. On Tuesday  . glimepiride (AMARYL) 4 MG tablet Take 4 mg by mouth 2 (two) times daily.  . hydroxychloroquine (PLAQUENIL) 200 MG tablet Take 200mg  by mouth twice daily, Monday through Friday only.  . Insulin Glargine (TOUJEO SOLOSTAR [place]) Inject 45-50 Units into the skin at bedtime.   Tuesday lisinopril (PRINIVIL,ZESTRIL) 20 MG tablet Take 20 mg by mouth daily. Take 1 tablet by mouth in the mornings  . Loratadine (CLARITIN PO) Take 1 tablet by mouth daily as needed.   . Magnesium 250 MG TABS Take 250 mg by mouth daily.  Marland Kitchen  metFORMIN (GLUCOPHAGE) 1000 MG tablet Take 1,000 mg by mouth 2 (two) times daily with a meal.  . metoprolol tartrate (LOPRESSOR) 25 MG tablet Take 1 tablet (25 mg total) by mouth 2 (two) times daily.  . Multiple Vitamin (MULTIVITAMIN PO) Take by mouth daily.  . Omega-3 Fatty Acids (FISH OIL) 1000 MG CAPS Take 1,000 mg by mouth daily.   Marland Kitchen VITAMIN D PO Take 2,000 mg by mouth.   . [DISCONTINUED] amLODipine (NORVASC)  10 MG tablet Take 5 mg by mouth daily.      Allergies  Allergen Reactions  . Influenza Vaccines Swelling    Arm was swollen in 2002   . Sitagliptin Swelling  . Guaifenesin Other (See Comments)    Unknown  . Latex Other (See Comments)    Unknown  . Nitrofurantoin Nausea Only  . Other Other (See Comments)    Band-Aid  . Phenylephrine-Pheniramine-Dm Other (See Comments)    Unknown  . Ultram [Tramadol Hcl] Other (See Comments)    hallucinations  . Escitalopram Oxalate Other (See Comments)    Hallucinations    Social History   Socioeconomic History  . Marital status: Married    Spouse name: Not on file  . Number of children: Not on file  . Years of education: Not on file  . Highest education level: Not on file  Occupational History  . Not on file  Tobacco Use  . Smoking status: Former Smoker    Quit date: 1983    Years since quitting: 38.9  . Smokeless tobacco: Never Used  Vaping Use  . Vaping Use: Never used  Substance and Sexual Activity  . Alcohol use: No  . Drug use: No  . Sexual activity: Not on file  Other Topics Concern  . Not on file  Social History Narrative  . Not on file   Social Determinants of Health   Financial Resource Strain:   . Difficulty of Paying Living Expenses: Not on file  Food Insecurity:   . Worried About Programme researcher, broadcasting/film/video in the Last Year: Not on file  . Ran Out of Food in the Last Year: Not on file  Transportation Needs:   . Lack of Transportation (Medical): Not on file  . Lack of Transportation (Non-Medical): Not on file  Physical Activity:   . Days of Exercise per Week: Not on file  . Minutes of Exercise per Session: Not on file  Stress:   . Feeling of Stress : Not on file  Social Connections:   . Frequency of Communication with Friends and Family: Not on file  . Frequency of Social Gatherings with Friends and Family: Not on file  . Attends Religious Services: Not on file  . Active Member of Clubs or Organizations: Not on  file  . Attends Banker Meetings: Not on file  . Marital Status: Not on file  Intimate Partner Violence:   . Fear of Current or Ex-Partner: Not on file  . Emotionally Abused: Not on file  . Physically Abused: Not on file  . Sexually Abused: Not on file     Review of Systems: General: negative for chills, fever, night sweats or weight changes.  Cardiovascular: negative for chest pain, dyspnea on exertion, edema, orthopnea, palpitations, paroxysmal nocturnal dyspnea or shortness of breath Dermatological: negative for rash Respiratory: negative for cough or wheezing Urologic: negative for hematuria Abdominal: negative for nausea, vomiting, diarrhea, bright red blood per rectum, melena, or hematemesis Neurologic: negative for visual changes, syncope,  or dizziness All other systems reviewed and are otherwise negative except as noted above.    Blood pressure (!) 138/54, pulse 65, height 5\' 3"  (1.6 m), weight 176 lb (79.8 kg).  General appearance: alert and no distress Neck: no adenopathy, no carotid bruit, no JVD, supple, symmetrical, trachea midline and thyroid not enlarged, symmetric, no tenderness/mass/nodules Lungs: clear to auscultation bilaterally Heart: regular rate and rhythm, S1, S2 normal, no murmur, click, rub or gallop Extremities: extremities normal, atraumatic, no cyanosis or edema Pulses: 2+ and symmetric Skin: Skin color, texture, turgor normal. No rashes or lesions Neurologic: Alert and oriented X 3, normal strength and tone. Normal symmetric reflexes. Normal coordination and gait  EKG sinus rhythm at 65 without ST or T wave changes.  I personally reviewed this EKG.  ASSESSMENT AND PLAN:   Stroke (cerebrum) (HCC) History of stroke 10/24/2016 on full dose aspirin per Dr. 12/24/2016.  This did leave her with some numbness in her right fourth and fifth fingers as well as a field cut.  Essential hypertension History of essential hypertension a blood pressure  measured today at 138/54.  She is on lisinopril, amlodipine, and metoprolol.  Hyperlipidemia History of hyperlipidemia on high-dose statin therapy.  We will check a fasting lipid liver profile this morning.  Palpitations History of palpitations on beta-blocker.  She did have a Zio patch that showed short runs of SVT isolated PACs and PVCs.  She has cut back her caffeine from 4 cups a day to 2 cups a day with one of them being decaf.  She is on metoprolol 25 mg p.o. twice daily and continues to complain of some palpitations.  I am going to change her amlodipine to Cardizem CD 120 mg a day to see whether or not this improves the frequency and/or severity of her palpitations.      Pearlean Brownie MD FACP,FACC,FAHA, [facility] 12/24/2019 8:09 AM

## 2019-12-24 NOTE — Assessment & Plan Note (Signed)
History of essential hypertension a blood pressure measured today at 138/54.  She is on lisinopril, amlodipine, and metoprolol.

## 2019-12-24 NOTE — Assessment & Plan Note (Signed)
History of hyperlipidemia on high-dose statin therapy.  We will check a fasting lipid liver profile this morning.

## 2020-01-15 ENCOUNTER — Telehealth: Payer: Self-pay | Admitting: Cardiovascular Disease

## 2020-01-15 MED ORDER — METOPROLOL TARTRATE 50 MG PO TABS
50.0000 mg | ORAL_TABLET | Freq: Two times a day (BID) | ORAL | 3 refills | Status: DC
Start: 2020-01-15 — End: 2020-12-30

## 2020-01-15 MED ORDER — AMLODIPINE BESYLATE 10 MG PO TABS: 10.0000 mg | ORAL_TABLET | Freq: Every day | ORAL | 3 refills | Status: DC

## 2020-01-15 NOTE — Telephone Encounter (Signed)
Change the Cardizem to amlodipine 10 mg a day and increase the metoprolol from 25 to 50 mg p.o. twice daily.  Have her keep a blood pressure log every day for 3 days and come back to see a Pharm.D. in 1 month to review

## 2020-01-15 NOTE — Telephone Encounter (Signed)
Pt c/o medication issue:  1. Name of Medication:  diltiazem (CARDIZEM CD) 120 MG 24 hr capsule  metoprolol tartrate (LOPRESSOR) 25 MG tablet    2. How are you currently taking this medication (dosage and times per day)? 1 tablet daily diltiazem, 1 tablet twice daily metoprolol  3. Are you having a reaction (difficulty breathing--STAT)? no  4. What is your medication issue? Patient states she has been having headaches and agitation since being on the medication. She states her BP has also gone up and has been around 160's/70's and her HR has been in the 70's. She states she is not sure if the headaches are from the medication or the weather, because she does have allergies. She states sometimes it feels like someone has " hit her in the face with a pan". She states she also has been having dizziness, but is not dizzy now. She states she also has had vertigo since 2012. She states last night was the worst night of her symptoms so she took half a tablet of her lopressor. She states she was told she could take half a tablet if her symptoms get bad.

## 2020-01-15 NOTE — Telephone Encounter (Signed)
Pt made aware of MD's recommendations and verbalized understanding. Med list updated to reflect changes

## 2020-01-15 NOTE — Telephone Encounter (Signed)
Pt called reporting since medication change on 11/30 she has noticed her BP continues to increase. Pt state systolic is averaging between 086'P-619'J and even gotten as high as 170's. Pt state she continues to experience off and on headaches and this is usually and indication that her blood pressure is elevated. Pt report last night was the worst as she experienced a severe headache and state she felt dizzy. BP today 160/72 HR 66. Pt also report an episode of feeling like her heart was racing. She state she took and extra half dose of metoprolol and symptoms resolved.   Will forward to MD for recommendations.

## 2020-01-22 ENCOUNTER — Telehealth: Payer: Self-pay | Admitting: Cardiovascular Disease

## 2020-01-22 NOTE — Telephone Encounter (Signed)
    Pt said she's been taking wrong dosage of her medications since Dr. Allyson Sabal changed it. She wanted to ask if she needs to r/s her pharmD apt tomorrow, if yes then she wanted to come in next week. No available appt and pt asked to send a message to the pharmD for recommendations

## 2020-01-22 NOTE — Telephone Encounter (Signed)
Spoke with patient - she just got back to amlodipine 10 mg yesterday.  Moved appointment to January 13.  Patient agreeable with plan.

## 2020-01-23 ENCOUNTER — Ambulatory Visit: Payer: Medicare Other

## 2020-02-06 ENCOUNTER — Other Ambulatory Visit: Payer: Self-pay

## 2020-02-06 ENCOUNTER — Ambulatory Visit (INDEPENDENT_AMBULATORY_CARE_PROVIDER_SITE_OTHER): Payer: Medicare Other | Admitting: Pharmacist Clinician (PhC)/ Clinical Pharmacy Specialist

## 2020-02-06 DIAGNOSIS — I10 Essential (primary) hypertension: Secondary | ICD-10-CM

## 2020-02-06 NOTE — Progress Notes (Signed)
02/07/2020 [name] 1940-10-21 [id number]   HPI:  Jill Howard is a 80 y.o. female patient of Dr Allyson Sabal, with a PMH below who presents today for hypertension clinic evaluation.   She was seen by Dr. Allyson Sabal in November, having problems with ongoing palpitations.  Dr. Allyson Sabal switched her amlodipine to diltiazem ER 120 mg.  After a month on the new medication she had noticed no changes in palpitations, but her blood pressure was up into the 150-160's systolic.  He switched her back to amlodipine, now 10 mg, and increased metoprolol to 50 mg bid.    She is in the office to day for follow up.  She notes the palpitations still come and go, mostly lasting 5-10 minutes at a time.  Occasionally they will go off/on all night long and cause some dizziness.    Past Medical History: hyperlipidemia 11/21 LDL at 83 on atorvastatin 80 mg  DM2 11/21 A1c 7.9 - On Trulicity, glimepiride, Toujeo and metformin  ASCVD Stroke 10/2016, now has migraines, on ASA 325  palpitations Ongoing         Blood Pressure Goal:  130/80  Current Medications: amlodipine 10 mg qd (pm), lisinopril 20 mg qd (am), metoprolol 50 mg bid  Family Hx: father died at 33 from MVA, had epilepsy; mother died at 61 from Alzheimers - had brain surgery in 53, hypertension, high cholesterol,cancer; brother died at 39, first MI at 35, had Agent Orange exposure in [place] (COPD, DM); 2 children both diabetic; son has tachycardia issues, daughter has problems with vitamin absorption  Social Hx: quit smoking in 12; no alcohol, coffee in the am - usually 1-2 cups per day (decaf now due to palpitations)  Diet: all home cooked meals, veggies fresh and frozen, occasional canned are no salt added; eats most meats, but more chicken and fish;   Exercise: not much, had COVID 1 year ago, but 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  Looking at  just January - first morning reading of the day, has 10 readings averaging 146/68 with HR at 74   Intolerances: no specific cardiac medication intolerances; see allergy list  Labs: 8/21: Na 137, K 4.5, Glu 230, BUN 22, SCr 0.72  Wt Readings from Last 3 Encounters:  02/06/20 180 lb 12.8 oz (82 kg)  12/24/19 176 lb (79.8 kg)  10/09/19 176 lb 3.2 oz (79.9 kg)   BP Readings from Last 3 Encounters:  02/06/20 (!) 144/62  12/24/19 (!) 138/54  10/09/19 (!) 151/72   Pulse Readings from Last 3 Encounters:  02/06/20 68  12/24/19 65  10/09/19 77    Current Outpatient Medications  Medication Sig Dispense Refill  . amLODipine (NORVASC) 10 MG tablet Take 1 tablet (10 mg total) by mouth daily. 90 tablet 3  . aspirin 325 MG tablet Take 325 mg by mouth daily.    Marland Kitchen atorvastatin (LIPITOR) 80 MG tablet Take 1 tablet (80 mg total) by mouth at bedtime. 30 tablet 3  . clopidogrel (PLAVIX) 75 MG tablet Take 1 tablet (75 mg total) by mouth daily. 30 tablet 4  . Cyanocobalamin (VITAMIN B-12 PO) Take 5,000 mg by mouth daily.    . diclofenac sodium (VOLTAREN) 1 % GEL Apply 2 g topically as needed.     . diphenhydrAMINE (BENADRYL) 25 MG tablet Take 25 mg by mouth at bedtime as needed.    Marland Kitchen  Dulaglutide (TRULICITY) 1.5 MG/0.5ML SOPN Inject 1.5 mg into the skin once a week. On Tuesday    . glimepiride (AMARYL) 4 MG tablet Take 4 mg by mouth 2 (two) times daily.    . hydroxychloroquine (PLAQUENIL) 200 MG tablet Take 200mg  by mouth twice daily, Monday through Friday only. 40 tablet 0  . Insulin Glargine (TOUJEO SOLOSTAR [place]) Inject 45-50 Units into the skin at bedtime.     Monday lisinopril (PRINIVIL,ZESTRIL) 20 MG tablet Take 20 mg by mouth daily. Take 1 tablet by mouth in the mornings    . Loratadine (CLARITIN PO) Take 1 tablet by mouth daily as needed.     . Magnesium 250 MG TABS Take 250 mg by mouth daily.    . metFORMIN (GLUCOPHAGE) 1000 MG tablet Take 1,000 mg by mouth 2 (two) times daily with a meal.    .  metoprolol tartrate (LOPRESSOR) 50 MG tablet Take 1 tablet (50 mg total) by mouth 2 (two) times daily. 180 tablet 3  . Multiple Vitamin (MULTIVITAMIN PO) Take by mouth daily.    . Omega-3 Fatty Acids (FISH OIL) 1000 MG CAPS Take 1,000 mg by mouth daily.     Marland Kitchen VITAMIN D PO Take 2,000 mg by mouth.      No current facility-administered medications for this visit.    Allergies  Allergen Reactions  . Influenza Vaccines Swelling    Arm was swollen in 2002   . Sitagliptin Swelling  . Guaifenesin Other (See Comments)    Unknown  . Latex Other (See Comments)    Unknown  . Nitrofurantoin Nausea Only  . Other Other (See Comments)    Band-Aid  . Phenylephrine-Pheniramine-Dm Other (See Comments)    Unknown  . Ultram [Tramadol Hcl] Other (See Comments)    hallucinations  . Escitalopram Oxalate Other (See Comments)    Hallucinations    Past Medical History:  Diagnosis Date  . Diabetes mellitus without complication (HCC)   . Gallstones   . Hypertension   . Kidney stone   . Kidney stone   . Stroke (HCC) 10/24/1996  . Vertigo     Blood pressure (!) 144/62, pulse 68, height 5\' 3"  (1.6 m), weight 180 lb 12.8 oz (82 kg).  Essential hypertension Patient with essential hypertension and ongoing palpitations, still not at goal BP.  Will increase metoprolol to 75 mg bid for now and have patient call office in 2 weeks to report home BP readings, as well as note any decrease in palpitations.  At that time I suspect we will need to increase lisinopril to 40 mg daily to help better control BP.  We will plan to see her back in the office in 6 weeks for follow up in person.    12/24/1996 PharmD CPP [facility] [address] [place], [address] [place] [phone]

## 2020-02-06 NOTE — Patient Instructions (Addendum)
Return for a a follow up appointment March 2 with Azalee Course PA  Check your blood pressure at home daily and keep record of the readings.  Take your BP meds as follows:  Increase metoprolol to 75 mg twice daily (take 1 - 25 mg tablet and 1 - 50 mg tablet = twice daily)  Continue with all other medications  Call Belenda Cruise in 2 weeks to let me know what your home BP readings are doing.  Call [phone].  At that time if your pressure is still elevated we will adjust your medications.    Bring all of your meds, your BP cuff and your record of home blood pressures to your next appointment.  Exercise as you're able, try to walk approximately 30 minutes per day.  Keep salt intake to a minimum, especially watch canned and prepared boxed foods.  Eat more fresh fruits and vegetables and fewer canned items.  Avoid eating in fast food restaurants.    HOW TO TAKE YOUR BLOOD PRESSURE: . Rest 5 minutes before taking your blood pressure. .  Don't smoke or drink caffeinated beverages for at least 30 minutes before. . Take your blood pressure before (not after) you eat. . Sit comfortably with your back supported and both feet on the floor (don't cross your legs). . Elevate your arm to heart level on a table or a desk. . Use the proper sized cuff. It should fit smoothly and snugly around your bare upper arm. There should be enough room to slip a fingertip under the cuff. The bottom edge of the cuff should be 1 inch above the crease of the elbow. . Ideally, take 3 measurements at one sitting and record the average.

## 2020-02-07 ENCOUNTER — Encounter: Payer: Self-pay | Admitting: Pharmacist Clinician (PhC)/ Clinical Pharmacy Specialist

## 2020-02-07 NOTE — Assessment & Plan Note (Signed)
Patient with essential hypertension and ongoing palpitations, still not at goal BP.  Will increase metoprolol to 75 mg bid for now and have patient call office in 2 weeks to report home BP readings, as well as note any decrease in palpitations.  At that time I suspect we will need to increase lisinopril to 40 mg daily to help better control BP.  We will plan to see her back in the office in 6 weeks for follow up in person.

## 2020-03-20 ENCOUNTER — Ambulatory Visit: Payer: Medicare Other | Admitting: Cardiovascular Disease

## 2020-03-25 ENCOUNTER — Encounter: Payer: Self-pay | Admitting: Physician Assistant

## 2020-03-25 ENCOUNTER — Ambulatory Visit (INDEPENDENT_AMBULATORY_CARE_PROVIDER_SITE_OTHER): Payer: Medicare Other | Admitting: Physician Assistant

## 2020-03-25 ENCOUNTER — Other Ambulatory Visit: Payer: Self-pay

## 2020-03-25 ENCOUNTER — Other Ambulatory Visit: Payer: Self-pay | Admitting: Physician Assistant

## 2020-03-25 VITALS — BP 122/60 | HR 68 | Ht 63.0 in | Wt 179.4 lb

## 2020-03-25 DIAGNOSIS — E785 Hyperlipidemia, unspecified: Secondary | ICD-10-CM | POA: Diagnosis not present

## 2020-03-25 DIAGNOSIS — I10 Essential (primary) hypertension: Secondary | ICD-10-CM

## 2020-03-25 DIAGNOSIS — Z8673 Personal history of transient ischemic attack (TIA), and cerebral infarction without residual deficits: Secondary | ICD-10-CM

## 2020-03-25 DIAGNOSIS — E119 Type 2 diabetes mellitus without complications: Secondary | ICD-10-CM

## 2020-03-25 DIAGNOSIS — R002 Palpitations: Secondary | ICD-10-CM | POA: Diagnosis not present

## 2020-03-25 MED ORDER — METOPROLOL TARTRATE 25 MG PO TABS: ORAL_TABLET | ORAL | 3 refills | Status: DC

## 2020-03-25 NOTE — Progress Notes (Unsigned)
Cardiology Office Note:    Date:  03/27/2020   ID:  Jill Howard, DOB [birth year], MRN [id number]  PCP:  Drosinis, Oswaldo Conroy   [facility]  Cardiologist:  Nanetta Batty, MD  Advanced Practice Provider:  No care team member to display Electrophysiologist:  None   Referring MD: Drosinis, Leonia Reader, PA-C   Chief Complaint  Patient presents with  . Follow-up    Seen for Dr. Allyson Sabal    History of Present Illness:    Jill Howard is a 80 y.o. female with a hx of hypertension, hyperlipidemia, fibromyalgia, vascular dementia,DM2 and history of CVA in August 2018. She never had a heart attack in the past. Ziopatch placed in February 2020 showed heart rate ranges between 55-142 averaging 83, sinus rhythm with short runs of SVT up to 5 beats was isolated supraventricular extra beats. There was no atrial fibrillation noted. She was drinking up to 4 cups of coffee a day, she was instructedto cut back on caffeine.  I previously saw the patient in June 2020 for palpitation, metoprolol tartrate 12.5 mg twice a day was added for rate control.  Unfortunately patient was diagnosed with COVID-19 in February.  She was seen in the ED on 03/01/2019.  CTA showed no PE, however does demonstrate pneumonia likely related to Covid.  She was treated with IV fluid and was subsequently discharged home.  Since then, she has received a Moderna COVID 19 vaccine in May.  After receiving the vaccine, she started having increased palpitation.  Her beta-blocker was last increased to 25 mg BID by Dr. Allyson Sabal in May 2021.  During her previous visit with Dr. Allyson Sabal in November 2021, she was still complaining some palpitation despite on 25 mg twice daily dosing of metoprolol.  Her amlodipine was discontinued and switched to Cardizem CD 120 mg daily to help further suppress palpitation.  However after a month of taking Cardizem, she did not notice any changes to her palpitation, instead her blood pressure went  up to 150s to 160s systolic.  Therefore she switch back to amlodipine 10 mg and increased metoprolol to 50 mg twice daily dosing.  During her visit with our clinical pharmacist in January 2022, metoprolol was further increased to 75 mg twice a day.  Patient presents today for follow-up.  She denies any chest pain.  She continued to have palpitation mainly at night and no palpitation during the day.  Blood pressure seems to be very well controlled in the 120s to 130s range on the 75 mg twice a day of metoprolol.  This is coming down from 142 and 150s systolic range in January.  Patient says that she had a rough January as she lost multiple friends and at least 2 family members within a short period of time.  Since she continued to have palpitation, I recommended increase the metoprolol to 75 mg in the a.m. and 100 mg in p.m.  She can follow-up with Dr. Allyson Sabal in 3 months.    Past Medical History:  Diagnosis Date  . Diabetes mellitus without complication (HCC)   . Gallstones   . Hypertension   . Kidney stone   . Kidney stone   . Stroke (HCC) 10/24/1996  . Vertigo     Past Surgical History:  Procedure Laterality Date  . ABDOMINAL HYSTERECTOMY    . KIDNEY STONE SURGERY      Current Medications: Current Meds  Medication Sig  . amLODipine (NORVASC) 10 MG tablet Take 1  tablet (10 mg total) by mouth daily.  Marland Kitchen aspirin 325 MG tablet Take 325 mg by mouth daily.  Marland Kitchen atorvastatin (LIPITOR) 80 MG tablet Take 1 tablet (80 mg total) by mouth at bedtime.  . clopidogrel (PLAVIX) 75 MG tablet Take 1 tablet (75 mg total) by mouth daily.  . Cyanocobalamin (VITAMIN B-12 PO) Take 5,000 mg by mouth daily.  . diclofenac sodium (VOLTAREN) 1 % GEL Apply 2 g topically as needed.   . diphenhydrAMINE (BENADRYL) 25 MG tablet Take 25 mg by mouth at bedtime as needed.  . Dulaglutide 1.5 MG/0.5ML SOPN Inject 1.5 mg into the skin once a week. On Tuesday  . glimepiride (AMARYL) 4 MG tablet Take 4 mg by mouth 2 (two)  times daily.  . Insulin Glargine (TOUJEO SOLOSTAR [place]) Inject 45-50 Units into the skin at bedtime.   Marland Kitchen lisinopril (PRINIVIL,ZESTRIL) 20 MG tablet Take 20 mg by mouth daily. Take 1 tablet by mouth in the mornings  . Magnesium 250 MG TABS Take 250 mg by mouth daily.  . metFORMIN (GLUCOPHAGE) 1000 MG tablet Take 1,000 mg by mouth 2 (two) times daily with a meal.  . metoprolol tartrate (LOPRESSOR) 25 MG tablet To combine with patient 50 mg tablet, pt will take 75 mg in the morning and 100 mg at bedtime  . metoprolol tartrate (LOPRESSOR) 50 MG tablet Take 1 tablet (50 mg total) by mouth 2 (two) times daily.  . Multiple Vitamin (MULTIVITAMIN PO) Take by mouth daily.  . Omega-3 Fatty Acids (FISH OIL) 1000 MG CAPS Take 1,000 mg by mouth daily.   Marland Kitchen VITAMIN D PO Take 2,000 mg by mouth.      Allergies:   Influenza vaccines, Sitagliptin, Guaifenesin, Latex, Nitrofurantoin, Other, Phenylephrine-pheniramine-dm, Ultram [tramadol hcl], and Escitalopram oxalate   Social History   Socioeconomic History  . Marital status: Married    Spouse name: Not on file  . Number of children: Not on file  . Years of education: Not on file  . Highest education level: Not on file  Occupational History  . Not on file  Tobacco Use  . Smoking status: Former Smoker    Quit date: 1983    Years since quitting: 39.1  . Smokeless tobacco: Never Used  Vaping Use  . Vaping Use: Never used  Substance and Sexual Activity  . Alcohol use: No  . Drug use: No  . Sexual activity: Not on file  Other Topics Concern  . Not on file  Social History Narrative  . Not on file   Social Determinants of Health   Financial Resource Strain: Not on file  Food Insecurity: Not on file  Transportation Needs: Not on file  Physical Activity: Not on file  Stress: Not on file  Social Connections: Not on file     Family History: The patient's family history includes COPD in her brother; Cancer in an other family member; Diabetes in her  daughter, son, and another family member; Heart attack in her brother, mother, and another family member; Heart disease in her son; Rheum arthritis in her paternal aunt and paternal aunt; Stroke in her brother, maternal aunt, mother, paternal aunt, and another family member.  ROS:   Please see the history of present illness.     All other systems reviewed and are negative.  EKGs/Labs/Other Studies Reviewed:    The following studies were reviewed today:  Echo 03/24/2017 LV EF: 60% -  65%   -------------------------------------------------------------------  Indications:   TIA 435.9.   -------------------------------------------------------------------  History:  PMH:  Stroke. Risk factors: Hypertension. Diabetes  mellitus.   -------------------------------------------------------------------  Study Conclusions   - Left ventricle: The cavity size was normal. There was mild  concentric hypertrophy. Systolic function was normal. The  estimated ejection fraction was in the range of 60% to 65%. Wall  motion was normal; there were no regional wall motion  abnormalities. Doppler parameters are consistent with abnormal  left ventricular relaxation (grade 1 diastolic dysfunction).  Doppler parameters are consistent with indeterminate ventricular  filling pressure.  - Aortic valve: Transvalvular velocity was within the normal range.  There was no stenosis. There was no regurgitation. Valve area  (VTI): 2.06 cm^2. Valve area (Vmax): 1.95 cm^2. Valve area  (Vmean): 1.99 cm^2.  - Mitral valve: Transvalvular velocity was within the normal range.  There was no evidence for stenosis. There was trivial  regurgitation.  - Right ventricle: The cavity size was normal. Wall thickness was  normal. Systolic function was normal.  - Atrial septum: No defect or patent foramen ovale was identified.  - Tricuspid valve: There was trivial regurgitation.  - Pulmonary  arteries: PA peak pressure: 16 mm Hg (S).   EKG:  EKG is not ordered today.   Recent Labs: 09/13/2019: BUN 22; Creat 0.72; Hemoglobin 12.4; Platelets 193; Potassium 4.5; Sodium 137 12/24/2019: ALT 21  Recent Lipid Panel    Component Value Date/Time   CHOL 150 12/24/2019 0840   TRIG 109 12/24/2019 0840   HDL 47 12/24/2019 0840   CHOLHDL 3.2 12/24/2019 0840   CHOLHDL 3.5 03/24/2017 0643   VLDL 32 03/24/2017 0643   LDLCALC 83 12/24/2019 0840     Risk Assessment/Calculations:       Physical Exam:    VS:  BP 122/60   Pulse 68   Ht 5\' 3"  (1.6 m)   Wt 179 lb 6.4 oz (81.4 kg)   SpO2 99%   BMI 31.78 kg/m     Wt Readings from Last 3 Encounters:  03/25/20 179 lb 6.4 oz (81.4 kg)  02/06/20 180 lb 12.8 oz (82 kg)  12/24/19 176 lb (79.8 kg)     GEN:  Well nourished, well developed in no acute distress HEENT: Normal NECK: No JVD; No carotid bruits LYMPHATICS: No lymphadenopathy CARDIAC: RRR, no murmurs, rubs, gallops RESPIRATORY:  Clear to auscultation without rales, wheezing or rhonchi  ABDOMEN: Soft, non-tender, non-distended MUSCULOSKELETAL:  No edema; No deformity  SKIN: Warm and dry NEUROLOGIC:  Alert and oriented x 3 PSYCHIATRIC:  Normal affect   ASSESSMENT:    1. Palpitations   2. Essential hypertension   3. Hyperlipidemia LDL goal <70   4. Controlled type 2 diabetes mellitus without complication, without long-term current use of insulin (HCC)   5. H/O: CVA (cerebrovascular accident)    PLAN:    In order of problems listed above:  1. Palpitation: Her palpitation mainly occurs at night. Will increase metoprolol tartrate to 75 mg a.m. and 100 mg p.m.  2. Hypertension: Blood pressure fairly controlled on current therapy. Slight increase of metoprolol at nighttime to cover for nocturnal palpitation  3. Hyperlipidemia: On Lipitor  4. DM2: Managed by primary care provider  5. History of CVA: No recent recurrence.   Medication Adjustments/Labs and Tests  Ordered: Current medicines are reviewed at length with the patient today.  Concerns regarding medicines are outlined above.  No orders of the defined types were placed in this encounter.  Meds ordered this encounter  Medications  . metoprolol tartrate (LOPRESSOR) 25 MG  tablet    Sig: To combine with patient 50 mg tablet, pt will take 75 mg in the morning and 100 mg at bedtime    Dispense:  180 tablet    Refill:  3    Patient Instructions  Medication Instructions:  INCREASE- Metoprolol to 75 mg by mouth in the morning and 100 mg at bedtime  *If you need a refill on your cardiac medications before your next appointment, please call your pharmacy*   Lab Work: None Ordered   Testing/Procedures: None Ordered   Follow-Up: At [facility], you and your health needs are our priority.  As part of our continuing mission to provide you with exceptional heart care, we have created designated Provider Care Teams.  These Care Teams include your primary Cardiologist (physician) and Advanced Practice Providers (APPs -  Physician Assistants and Nurse Practitioners) who all work together to provide you with the care you need, when you need it.  We recommend signing up for the patient portal called "MyChart".  Sign up information is provided on this After Visit Summary.  MyChart is used to connect with patients for Virtual Visits (Telemedicine).  Patients are able to view lab/test results, encounter notes, upcoming appointments, etc.  Non-urgent messages can be sent to your provider as well.   To learn more about what you can do with MyChart, go to [email or web address].    Your next appointment:   3 month(s)  The format for your next appointment:   In Person  Provider:   You may see Nanetta BattyJonathan Berry, MD or one of the following Advanced Practice Providers on your designated Care Team:    Marjie Skiffallie Goodrich, PA-C  Edd FabianJesse Cleaver, FNP        Signed, Azalee CourseHao Meng, [place]PA  03/27/2020 10:55 PM     [facility]

## 2020-03-25 NOTE — Patient Instructions (Signed)
Medication Instructions:  INCREASE- Metoprolol to 75 mg by mouth in the morning and 100 mg at bedtime  *If you need a refill on your cardiac medications before your next appointment, please call your pharmacy*   Lab Work: None Ordered   Testing/Procedures: None Ordered   Follow-Up: At [facility], you and your health needs are our priority.  As part of our continuing mission to provide you with exceptional heart care, we have created designated Provider Care Teams.  These Care Teams include your primary Cardiologist (physician) and Advanced Practice Providers (APPs -  Physician Assistants and Nurse Practitioners) who all work together to provide you with the care you need, when you need it.  We recommend signing up for the patient portal called "MyChart".  Sign up information is provided on this After Visit Summary.  MyChart is used to connect with patients for Virtual Visits (Telemedicine).  Patients are able to view lab/test results, encounter notes, upcoming appointments, etc.  Non-urgent messages can be sent to your provider as well.   To learn more about what you can do with MyChart, go to [email or web address].    Your next appointment:   3 month(s)  The format for your next appointment:   In Person  Provider:   You may see Nanetta Batty, MD or one of the following Advanced Practice Providers on your designated Care Team:    [place], PA-C  Edd Fabian, FNP

## 2020-03-27 ENCOUNTER — Encounter: Payer: Self-pay | Admitting: Physician Assistant

## 2020-05-25 ENCOUNTER — Emergency Department (HOSPITAL_COMMUNITY)
Admission: EM | Admit: 2020-05-25 | Discharge: 2020-05-25 | Disposition: A | Discharge status: Home / Self Care | Payer: Medicare Other | Attending: Emergency Medicine | Admitting: Emergency Medicine

## 2020-05-25 ENCOUNTER — Encounter (HOSPITAL_COMMUNITY): Payer: Self-pay | Admitting: Pharmacy Technician

## 2020-05-25 ENCOUNTER — Emergency Department (HOSPITAL_COMMUNITY): Payer: Medicare Other

## 2020-05-25 ENCOUNTER — Other Ambulatory Visit: Payer: Self-pay

## 2020-05-25 DIAGNOSIS — M79602 Pain in left arm: Secondary | ICD-10-CM | POA: Diagnosis not present

## 2020-05-25 DIAGNOSIS — Z7982 Long term (current) use of aspirin: Secondary | ICD-10-CM | POA: Insufficient documentation

## 2020-05-25 DIAGNOSIS — Z79899 Other long term (current) drug therapy: Secondary | ICD-10-CM | POA: Insufficient documentation

## 2020-05-25 DIAGNOSIS — I10 Essential (primary) hypertension: Secondary | ICD-10-CM | POA: Diagnosis not present

## 2020-05-25 DIAGNOSIS — M542 Cervicalgia: Secondary | ICD-10-CM | POA: Insufficient documentation

## 2020-05-25 DIAGNOSIS — Z87891 Personal history of nicotine dependence: Secondary | ICD-10-CM | POA: Insufficient documentation

## 2020-05-25 DIAGNOSIS — Z91040 Latex allergy status: Secondary | ICD-10-CM | POA: Diagnosis not present

## 2020-05-25 DIAGNOSIS — Z20822 Contact with and (suspected) exposure to covid-19: Secondary | ICD-10-CM | POA: Diagnosis not present

## 2020-05-25 DIAGNOSIS — E119 Type 2 diabetes mellitus without complications: Secondary | ICD-10-CM | POA: Diagnosis not present

## 2020-05-25 DIAGNOSIS — Z7984 Long term (current) use of oral hypoglycemic drugs: Secondary | ICD-10-CM | POA: Insufficient documentation

## 2020-05-25 DIAGNOSIS — R42 Dizziness and giddiness: Secondary | ICD-10-CM

## 2020-05-25 DIAGNOSIS — Z794 Long term (current) use of insulin: Secondary | ICD-10-CM | POA: Insufficient documentation

## 2020-05-25 LAB — COMPREHENSIVE METABOLIC PANEL
ALT: 27 U/L (ref 0–44)
AST: 26 U/L (ref 15–41)
Albumin: 3.3 g/dL — ABNORMAL LOW (ref 3.5–5.0)
Alkaline Phosphatase: 71 U/L (ref 38–126)
Anion gap: 11 (ref 5–15)
BUN: 19 mg/dL (ref 8–23)
CO2: 22 mmol/L (ref 22–32)
Calcium: 10.1 mg/dL (ref 8.9–10.3)
Chloride: 103 mmol/L (ref 98–111)
Creatinine, Ser: 0.74 mg/dL (ref 0.44–1.00)
GFR, Estimated: >60 mL/min (ref >60)
Glucose, Bld: 149 mg/dL — ABNORMAL HIGH (ref 70–99)
Potassium: 4.2 mmol/L (ref 3.5–5.1)
Sodium: 136 mmol/L (ref 135–145)
Total Bilirubin: 0.7 mg/dL (ref 0.3–1.2)
Total Protein: 7 g/dL (ref 6.5–8.1)

## 2020-05-25 LAB — CBC
HCT: 40 % (ref 36.0–46.0)
Hemoglobin: 12.7 g/dL (ref 12.0–15.0)
MCH: 29.3 pg (ref 26.0–34.0)
MCHC: 31.8 g/dL (ref 30.0–36.0)
MCV: 92.2 fL (ref 80.0–100.0)
Platelets: 232 10*3/uL (ref 150–400)
RBC: 4.34 MIL/uL (ref 3.87–5.11)
RDW: 12.9 % (ref 11.5–15.5)
WBC: 7.7 10*3/uL (ref 4.0–10.5)
nRBC: 0 % (ref 0.0–0.2)

## 2020-05-25 LAB — URINALYSIS, ROUTINE W REFLEX MICROSCOPIC
Bacteria, UA: NONE SEEN
Bilirubin Urine: NEGATIVE
Glucose, UA: NEGATIVE mg/dL
Hgb urine dipstick: NEGATIVE
Ketones, ur: NEGATIVE mg/dL
Leukocytes,Ua: NEGATIVE
Nitrite: NEGATIVE
Protein, ur: 100 mg/dL — AB
Specific Gravity, Urine: 1.012 (ref 1.005–1.030)
pH: 5 (ref 5.0–8.0)

## 2020-05-25 LAB — APTT: aPTT: 26 seconds (ref 24–36)

## 2020-05-25 LAB — RAPID URINE DRUG SCREEN, HOSP PERFORMED
Amphetamines: NOT DETECTED
Barbiturates: NOT DETECTED
Benzodiazepines: NOT DETECTED
Cocaine: NOT DETECTED
Opiates: NOT DETECTED
Tetrahydrocannabinol: NOT DETECTED

## 2020-05-25 LAB — DIFFERENTIAL
Abs Immature Granulocytes: 0.04 10*3/uL (ref 0.00–0.07)
Basophils Absolute: 0.1 10*3/uL (ref 0.0–0.1)
Basophils Relative: 1 %
Eosinophils Absolute: 0.4 10*3/uL (ref 0.0–0.5)
Eosinophils Relative: 5 %
Immature Granulocytes: 1 %
Lymphocytes Relative: 46 %
Lymphs Abs: 3.6 10*3/uL (ref 0.7–4.0)
Monocytes Absolute: 0.6 10*3/uL (ref 0.1–1.0)
Monocytes Relative: 7 %
Neutro Abs: 3.1 10*3/uL (ref 1.7–7.7)
Neutrophils Relative %: 40 %

## 2020-05-25 LAB — PROTIME-INR
INR: 1 (ref 0.8–1.2)
Prothrombin Time: 13.5 seconds (ref 11.4–15.2)

## 2020-05-25 LAB — TROPONIN I (HIGH SENSITIVITY)
Troponin I (High Sensitivity): 4 ng/L (ref <18)
Troponin I (High Sensitivity): 4 ng/L (ref <18)

## 2020-05-25 LAB — RESP PANEL BY RT-PCR (FLU A&B, COVID) ARPGX2
Influenza A by PCR: NEGATIVE
Influenza B by PCR: NEGATIVE
SARS Coronavirus 2 by RT PCR: NEGATIVE

## 2020-05-25 MED ORDER — MECLIZINE HCL 25 MG PO TABS
50.0000 mg | ORAL_TABLET | Freq: Once | ORAL | Status: AC
  Administered 2020-05-25: 50 mg via ORAL
  Filled 2020-05-25: qty 2

## 2020-05-25 MED ORDER — LORAZEPAM 2 MG/ML IJ SOLN: 1.0000 mg | Freq: Once | INTRAMUSCULAR | Status: DC

## 2020-05-25 MED ORDER — LORAZEPAM 2 MG/ML IJ SOLN
1.0000 mg | Freq: Once | INTRAMUSCULAR | Status: AC | PRN
  Administered 2020-05-25: 1 mg via INTRAVENOUS
  Filled 2020-05-25: qty 1

## 2020-05-25 NOTE — ED Triage Notes (Signed)
Pt bib ems from home with reports of dizziness and neck pain.  0430 had to use the bathroom, felt dizzy 0630 pt up with continued dizziness with L neck pain Gradually worsening throughout the day down L shoulder and L arm. Pain is intermittent in nature.  Hx CVA, vertigo.  20g R hand 12 lead unremarkable,  154/80 HR 76 RR 18 99% RA CBG 184

## 2020-05-25 NOTE — Discharge Instructions (Signed)
Please read and follow all provided instructions.  Your diagnoses today include:  1. Vertigo     Tests performed today include: Blood cell counts (white, red, and platelets) Electrolytes  Kidney function test Urine test to check for infection  CT and MRI of your brain -does not show sign of stroke today or other problems  Vital signs. See below for your results today.   Medications prescribed:   None  Take any prescribed medications only as directed.  Home care instructions:  Follow any educational materials contained in this packet.  BE VERY CAREFUL not to take multiple medicines containing Tylenol (also called acetaminophen). Doing so can lead to an overdose which can damage your liver and cause liver failure and possibly death.   Follow-up instructions: Please follow-up with your primary care provider in the next 2 days for further evaluation of your symptoms.   Return instructions:   Please return to the Emergency Department if you experience worsening symptoms.   Return if you have weakness in your arms or legs, slurred speech, trouble walking or talking, confusion, or worsening trouble with your balance and cannot walk.   Please return if you have any other emergent concerns.  Additional Information:  Your vital signs today were: BP 135/64 (BP Location: Right Arm)   Pulse 77   Temp 98.3 F (36.8 C) (Oral)   Resp 16   Ht 5\' 3"  (1.6 m)   Wt 79.8 kg   SpO2 96%   BMI 31.18 kg/m  If your blood pressure (BP) was elevated above 135/85 this visit, please have this repeated by your doctor within one month. --------------

## 2020-05-25 NOTE — ED Notes (Signed)
RN reviewed discharge instructions w/ pt and pt's daughter. Follow up care reviewed, pt had no further questions

## 2020-05-25 NOTE — ED Notes (Signed)
Patient transported to MRI 

## 2020-05-25 NOTE — ED Notes (Signed)
RN ambulated pt, pt denied dizziness but report feeling nauseous and feeling foggy. Pt able to ambulate independently with steady gait

## 2020-05-25 NOTE — ED Provider Notes (Signed)
[facility] EMERGENCY DEPARTMENT Provider Note   CSN: [id number] Arrival date & time: 05/25/20  1314     History No chief complaint on file.   Jill Howard is a 80 y.o. female.  Patient with history of diabetes, previous stroke with right-sided residual symptoms in 2018, history of vertigo --presents the emergency department today for evaluation of vertigo and pain in her left neck and left arm.  Patient states that she went to bed around 1 AM and woke up with dizziness around 4:30 AM.  She went back to bed and woke up around 6:30 AM with symptoms persisting.  Since that time they have been intermittent.  Currently patient has some dizziness and pain in her left arm.  She denies any new vision changes or vision loss, speech difficulties, facial weakness.  She has noted numbness on the left side of her face.  No lower extremity symptoms.  She has been having difficulty due to feeling off balance.  No treatments prior to arrival.  Patient states that current symptoms felt similar to previous stroke, prompting EMS transport to the hospital.          Past Medical History:  Diagnosis Date  . Diabetes mellitus without complication (HCC)   . Gallstones   . Hypertension   . Kidney stone   . Kidney stone   . Stroke (HCC) 10/24/1996  . Vertigo     Patient Active Problem List   Diagnosis Date Noted  . Palpitations 07/11/2018  . TIA (transient ischemic attack) 06/13/2017  . Intractable headache   . Hyperlipidemia   . History of stroke   . Neurological symptoms 03/23/2017  . CVA (cerebral vascular accident) (HCC) 10/25/2016  . 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 Essential hypertension 10/24/2016  . Diabetes mellitus with complication (HCC) 10/24/2016  . Chronic pain 10/24/2016    Past Surgical History:  Procedure Laterality Date  . ABDOMINAL HYSTERECTOMY    . KIDNEY STONE SURGERY       OB History   No obstetric history on file.     Family History   Problem Relation Age of Onset  . Stroke Other   . Heart attack Other   . Cancer Other   . Diabetes Other   . Stroke Mother   . Heart attack Mother   . Stroke Maternal Aunt   . Stroke Paternal Aunt   . Rheum arthritis Paternal Aunt   . Stroke Brother   . Heart attack Brother   . COPD Brother   . Rheum arthritis Paternal Aunt   . Diabetes Son   . Heart disease Son   . Diabetes Daughter     Social History   Tobacco Use  . Smoking status: Former Smoker    Quit date: 1983    Years since quitting: 39.3  . Smokeless tobacco: Never Used  Vaping Use  . Vaping Use: Never used  Substance Use Topics  . Alcohol use: No  . Drug use: No    Home Medications Prior to Admission medications   Medication Sig Start Date End Date Taking? Authorizing Provider  amLODipine (NORVASC) 10 MG tablet Take 1 tablet (10 mg total) by mouth daily. 01/15/20 04/14/20  Runell Gess, MD  aspirin 325 MG tablet Take 325 mg by mouth daily.    [provider]  atorvastatin (LIPITOR) 80 MG tablet Take 1 tablet (80 mg total) by mouth at bedtime. 10/25/16   Rai, Delene Ruffini, MD  clopidogrel (PLAVIX) 75 MG  tablet Take 1 tablet (75 mg total) by mouth daily. 10/26/16   Rai, Ripudeep K, MD  Cyanocobalamin (VITAMIN B-12 PO) Take 5,000 mg by mouth daily.    [provider]  diclofenac sodium (VOLTAREN) 1 % GEL Apply 2 g topically as needed.  11/03/17   [provider]  diphenhydrAMINE (BENADRYL) 25 MG tablet Take 25 mg by mouth at bedtime as needed.    [provider]  Dulaglutide 1.5 MG/0.5ML SOPN Inject 1.5 mg into the skin once a week. On Tuesday    [provider]  glimepiride (AMARYL) 4 MG tablet Take 4 mg by mouth 2 (two) times daily. 03/28/19   [provider]  hydroxychloroquine (PLAQUENIL) 200 MG tablet Take 200mg  by mouth twice daily, Monday through Friday only. 10/09/19   10/11/19, MD  Insulin Glargine (TOUJEO SOLOSTAR [place]) Inject 45-50 Units into  the skin at bedtime.     [provider]  lisinopril (PRINIVIL,ZESTRIL) 20 MG tablet Take 20 mg by mouth daily. Take 1 tablet by mouth in the mornings 09/09/16   [provider]  Loratadine (CLARITIN PO) Take 1 tablet by mouth daily as needed.     [provider]  Magnesium 250 MG TABS Take 250 mg by mouth daily.    [provider]  metFORMIN (GLUCOPHAGE) 1000 MG tablet Take 1,000 mg by mouth 2 (two) times daily with a meal.    [provider]  metoprolol tartrate (LOPRESSOR) 25 MG tablet To combine with patient 50 mg tablet, pt will take 75 mg in the morning and 100 mg at bedtime 03/25/20   05/25/20, PA  metoprolol tartrate (LOPRESSOR) 50 MG tablet Take 1 tablet (50 mg total) by mouth 2 (two) times daily. 01/15/20   01/17/20, MD  Multiple Vitamin (MULTIVITAMIN PO) Take by mouth daily.    [provider]  Omega-3 Fatty Acids (FISH OIL) 1000 MG CAPS Take 1,000 mg by mouth daily.     [provider]  VITAMIN D PO Take 2,000 mg by mouth.     [provider]    Allergies    Influenza vaccines, Sitagliptin, Guaifenesin, Latex, Nitrofurantoin, Other, Phenylephrine-pheniramine-dm, Ultram [tramadol hcl], and Escitalopram oxalate  Review of Systems   Review of Systems  Constitutional: Negative for fever.  HENT: Negative for rhinorrhea and sore throat.   Eyes: Negative for redness.  Respiratory: Negative for cough.   Cardiovascular: Negative for chest pain.  Gastrointestinal: Negative for abdominal pain, diarrhea, nausea and vomiting.  Genitourinary: Negative for dysuria, frequency, hematuria and urgency.  Musculoskeletal: Negative for myalgias.  Skin: Negative for rash.  Neurological: Positive for dizziness, weakness (chronic, R arm, unchanged) and numbness. Negative for tremors, seizures, syncope, facial asymmetry, speech difficulty, light-headedness and headaches.    Physical Exam Updated Vital Signs BP 139/64    Pulse 67   Temp 98.3 F (36.8 C) (Oral)   Resp 19   Ht 5\' 3"  (1.6 m)   Wt 79.8 kg   SpO2 97%   BMI 31.18 kg/m   Physical Exam Vitals and nursing note reviewed.  Constitutional:      General: She is not in acute distress.    Appearance: She is well-developed.  HENT:     Head: Normocephalic and atraumatic.     Right Ear: Tympanic membrane, ear canal and external ear normal.     Left Ear: Tympanic membrane, ear canal and external ear normal.     Nose: Nose normal.  Eyes:  Conjunctiva/sclera: Conjunctivae normal.     Comments: Mild nystagmus, bilaterally.   Cardiovascular:     Rate and Rhythm: Normal rate and regular rhythm.     Heart sounds: No murmur heard.   Pulmonary:     Effort: No respiratory distress.     Breath sounds: No wheezing, rhonchi or rales.  Abdominal:     Palpations: Abdomen is soft.     Tenderness: There is no abdominal tenderness. There is no guarding or rebound.  Musculoskeletal:     Cervical back: Normal range of motion and neck supple.     Right lower leg: No edema.     Left lower leg: No edema.  Skin:    General: Skin is warm and dry.     Findings: No rash.  Neurological:     General: No focal deficit present.     Mental Status: She is alert. Mental status is at baseline.     Motor: No weakness.     Comments: Trace right upper extremity weakness that patient says is chronic.  No flexor/extensor weakness noted in left upper and bilateral lower extremities.  Psychiatric:        Mood and Affect: Mood normal.     ED Results / Procedures / Treatments   Labs (all labs ordered are listed, but only abnormal results are displayed) Labs Reviewed  COMPREHENSIVE METABOLIC PANEL - Abnormal; Notable for the following components:      Result Value   Glucose, Bld 149 (*)    Albumin 3.3 (*)    All other components within normal limits  URINALYSIS, ROUTINE W REFLEX MICROSCOPIC - Abnormal; Notable for the following components:   Protein, ur 100 (*)     All other components within normal limits  RESP PANEL BY RT-PCR (FLU A&B, COVID) ARPGX2  PROTIME-INR  APTT  CBC  DIFFERENTIAL  RAPID URINE DRUG SCREEN, HOSP PERFORMED  TROPONIN I (HIGH SENSITIVITY)  TROPONIN I (HIGH SENSITIVITY)    EKG EKG Interpretation  Date/Time:  Monday May 25 2020 13:26:08 EDT Ventricular Rate:  71 PR Interval:  190 QRS Duration: 88 QT Interval:  394 QTC Calculation: 428 R Axis:   -7 Text Interpretation: Normal sinus rhythm Minimal voltage criteria for LVH, may be normal variant ( R in aVL ) Borderline ECG when compared to prior, similar appearance. No STEMI Confirmed by Theda Belfastegeler, Chris ([id number]) on 05/25/2020 5:38:39 PM   Radiology CT Head Wo Contrast  Result Date: 05/25/2020 CLINICAL DATA:  Dizziness.  Left neck pain.  No reported injury EXAM: CT HEAD WITHOUT CONTRAST TECHNIQUE: Contiguous axial images were obtained from the base of the skull through the vertex without intravenous contrast. COMPARISON:  06/12/2017 head CT. FINDINGS: Brain: Chronic left parietal encephalomalacia. No evidence of parenchymal hemorrhage or extra-axial fluid collection. No mass lesion, mass effect, or midline shift. No CT evidence of acute infarction. Nonspecific mild subcortical and periventricular white matter hypodensity, most in keeping with chronic small vessel ischemic change. No ventriculomegaly. Vascular: No acute abnormality. Skull: No evidence of calvarial fracture. Sinuses/Orbits: The visualized paranasal sinuses are essentially clear. Other:  The mastoid air cells are unopacified. IMPRESSION: 1. No evidence of acute intracranial abnormality. 2. Chronic left parietal encephalomalacia. 3. Mild chronic small vessel ischemic changes in the cerebral white matter. Electronically Signed   By: Delbert PhenixJason A Poff M.D.   On: 05/25/2020 15:27   MR BRAIN WO CONTRAST  Result Date: 05/25/2020 CLINICAL DATA:  Dizziness. Question posterior circulation stroke. Left neck pain. EXAM: MRI HEAD WITHOUT  CONTRAST TECHNIQUE: Multiplanar, multiecho pulse sequences of the brain and surrounding structures were obtained without intravenous contrast. COMPARISON:  Head CT earlier same day. FINDINGS: Brain: Diffusion imaging does not show any acute or subacute infarction. Brainstem and cerebellum are normal. There is old infarction in the left parieto-occipital brain with atrophy, encephalomalacia and gliosis. Mild chronic small-vessel ischemic change affecting the white matter elsewhere. No mass, acute hemorrhage, hydrocephalus or extra-axial collection. Minimal hemosiderin deposition associated with the left parieto-occipital old stroke. Vascular: Major vessels at the base of the brain show flow. Skull and upper cervical spine: Negative Sinuses/Orbits: Clear/normal Other: None IMPRESSION: No acute finding. Old infarction in the left parieto-occipital brain with atrophy, encephalomalacia and gliosis. Mild chronic small-vessel change of the hemispheric white matter elsewhere. Electronically Signed   By: Paulina Fusi M.D.   On: 05/25/2020 17:30    Procedures Procedures   Medications Ordered in ED Medications  LORazepam (ATIVAN) injection 1 mg (1 mg Intravenous Given 05/25/20 1645)  meclizine (ANTIVERT) tablet 50 mg (50 mg Oral Given 05/25/20 1850)    ED Course  I have reviewed the triage vital signs and the nursing notes.  Pertinent labs & imaging results that were available during my care of the patient were reviewed by me and considered in my medical decision making (see chart for details).  Patient seen and examined. Work-up initiated. Medications ordered.   Vital signs reviewed and are as follows: BP 139/64   Pulse 67   Temp 98.3 F (36.8 C) (Oral)   Resp 19   Ht 5\' 3"  (1.6 m)   Wt 79.8 kg   SpO2 97%   BMI 31.18 kg/m   Work-up is reassuring.  No visualized stroke on MRI.  Discussed with Dr. who will see.  7:13 PM patient seen by Dr. Rush Landmark.  Plan is to reevaluate after meclizine and  have patient ambulate.  She can likely go home if she is feeling better.  9:37 PM patient has ambulated with assistance.  She is still a bit woozy from the Ativan given for her MRI, however daughter reports that this did well for her.  We again reviewed all results at bedside.  Patient and family are comfortable with discharged home at this time.  Patient is relieved that she did not have another stroke.  We reviewed remainder of work-up which is very reassuring.  Patient counseled to return if they have worsening symptoms, weakness in their arms or legs, slurred speech, trouble walking or talking, confusion, trouble with their balance, or if they have any other concerns. Patient verbalizes understanding and agrees with plan.      MDM Rules/Calculators/A&P                          Patient with vertigo symptoms and neck and arm pain.  From a functional standpoint, her strength remains at baseline.  She does not have any cranial nerve abnormalities.  Sensation is intact.  Her main complaint was worsening of her intermittent vertigo.  She was evaluated for new stroke without any new findings.  Her work-up is otherwise reassuring without any acute findings.  Patient has been able to ambulate in the emergency department.  She has family who will watch her closely.  We discussed close follow-up with PCP and she agrees to do so.  At this point, feel that vertigo is most likely peripheral in nature.  Do not suspect ACS, PE, dissection --including vertebral or carotid artery  dissection.    Final Clinical Impression(s) / ED Diagnoses Final diagnoses:  Vertigo    Rx / DC Orders ED Discharge Orders    None       Nalayah Hitt, CRenne Criglerhe 05/25/20 2140    Tegeler, Canary Brim, MD 05/26/20 [phone]

## 2020-05-25 NOTE — ED Provider Notes (Addendum)
Emergency Medicine Provider Triage Evaluation Note  Jill Howard 80 y.o. F was evaluated in triage.  Pt complains of dizziness that began about 4:30 AM this morning.  Patient reports that she was at home and woke up this morning about 4:30 AM.  She noticed that she felt slightly dizzy.  She went back to bed to see if the feeling would pass when she woke up again about 6:30 AM, she noticed that it was still persistent.  She then started having some pain in her left side of her neck that she feels like radiates down to her left upper extremity.  Patient reports a history of stroke and states that it presented similarly.  Has what prompted her to call EMS.  She denies any chest pain, difficulty breathing, difficulty speaking, numbness/weakness of arms or legs.  Patient is on Plavix  Review of Systems  Positive: Dizziness Negative: Chest pain, shortness of breath, abdominal pain.  Physical Exam  BP 134/82   Pulse 70   Temp 98.2 F (36.8 C) (Oral)   Resp 18   Ht 5\' 4"  (1.626 m)   Wt 65.8 kg   SpO2 100%   BMI 24.89 kg/m  Gen:   Awake, no distress   HEENT:  Atraumatic. PERRL.  Resp:  Normal effort  Cardiac:  Normal rate  Abd:   Nondistended, nontender  MSK:   Moves extremities without difficulty.  5/5 strength bilateral upper and lower extremity.  No pronator drift Neuro:  Speech clear   Medical Decision Making  Medically screening exam initiated at 3:55 AM.  Appropriate orders placed.  [name] was informed that the remainder of the evaluation will be completed by another provider, this initial triage assessment does not replace that evaluation, and the importance of remaining in the ED until their evaluation is complete.  Patient's sypmtoms started at 4:30 AM. She is outside the Stroke window.    Clinical Impression  Dizziness   Portions of this note were generated with Dragon dictation software. Dictation errors may occur despite best attempts at proofreading.     [name], PA-C 05/25/20 1313    07/25/20, PA-C 05/25/20 1314    07/25/20, MD 05/26/20 1012

## 2020-06-30 ENCOUNTER — Ambulatory Visit (INDEPENDENT_AMBULATORY_CARE_PROVIDER_SITE_OTHER): Payer: Medicare Other | Admitting: Cardiovascular Disease

## 2020-06-30 ENCOUNTER — Encounter: Payer: Self-pay | Admitting: Cardiovascular Disease

## 2020-06-30 ENCOUNTER — Other Ambulatory Visit: Payer: Self-pay

## 2020-06-30 DIAGNOSIS — R002 Palpitations: Secondary | ICD-10-CM | POA: Diagnosis not present

## 2020-06-30 DIAGNOSIS — E782 Mixed hyperlipidemia: Secondary | ICD-10-CM

## 2020-06-30 DIAGNOSIS — I10 Essential (primary) hypertension: Secondary | ICD-10-CM | POA: Diagnosis not present

## 2020-06-30 NOTE — Progress Notes (Signed)
06/30/2020 Jill Howard   1940/09/13  [id number]  Primary Physician Drosinis, Leonia Reader, PA-C Primary Cardiologist: Runell Gess MD Nicholes Calamity, [place]  HPI:  Jill Howard is a 80 y.o.  married Caucasian female mother of 2 children, grandmother of 4 grandchildren referred by Hayden Rasmussen, PA-C for cardiovascular valuation because of palpitations.I last saw her in the office  12/24/2019.Her cardiac risk factors are notable for type 2 diabetes, essential hypertension, hyperlipidemia, fibromyalgia and vascular dementia. She has had a stroke 10/24/2016 and suffers from migraine headaches as well. She does not smoke. She is never had a heart attack. She denies chest pain. She gets occasional shortness of breath when walking and has occasional lower extremity edema in her feet. 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 She apparently had an office visit with Hayden Rasmussen 03/12/2018 at which time she was complaining of upper respiratory tract symptoms and was treated with Augmentin. She was also complaining of palpitations at that time. AZO patch was placed from 03/19/2020-04/02/2018 which showed heart rates ranging from 55-1 42 averaging 83, sinus rhythm. She did have short runs of SVT up to 5 beats with isolated supraventricular extra beats and rare ventricular extra beats. There was no atrial fibrillation noted. She does admit to drinking 4 cups of coffee a day. She is not very symptomatic from these palpitations other than she notices them.  She saw HaoMeng, PA-C in the office 07/01/2018 who placed her on low-dose beta-blocker just resulted in complete resolution of her symptoms. Blood work was drawn that revealed a normal TSH. We talked about the importance of reducing her caffeine intake as well.  She did receive her second dose of the Modernavaccine on May 10. Since that time she is noticed increasing arthritic pain, some lower extremity edema,  and increased palpitations.  She also has noticed some increased arthritic symptoms which she attributes to her second Materna dose.She has decreased her caffeine intake from 4 cups to 2 cups of coffee a day  Since I saw her 6 months ago she continues to have arthritic type symptoms especially involving her hips.    She continues to take her beta-blocker which has improved her palpitations.  Her major complaint is of profound fatigue.   Current Meds  Medication Sig  . amLODipine (NORVASC) 10 MG tablet Take 10 mg by mouth daily.  Marland Kitchen aspirin 325 MG tablet Take 325 mg by mouth daily.  Marland Kitchen atorvastatin (LIPITOR) 80 MG tablet Take 1 tablet (80 mg total) by mouth at bedtime.  . cetirizine (ZYRTEC) 10 MG tablet Take 10 mg by mouth daily.  . clopidogrel (PLAVIX) 75 MG tablet Take 1 tablet (75 mg total) by mouth daily.  . Continuous Blood Gluc Transmit (DEXCOM G6 TRANSMITTER) MISC Use as directed for continuous glucose monitoring. Reuse transmitter for 90 days then discard and replace.  . Cyanocobalamin (VITAMIN B-12 PO) Take 5,000 mg by mouth daily.  . diclofenac sodium (VOLTAREN) 1 % GEL Apply 2 g topically as needed.   . diphenhydrAMINE (BENADRYL) 25 MG tablet Take 25 mg by mouth at bedtime as needed.  . Dulaglutide 1.5 MG/0.5ML SOPN Inject 1.5 mg into the skin once a week. On Tuesday  . glimepiride (AMARYL) 4 MG tablet Take 4 mg by mouth 2 (two) times daily.  Marland Kitchen HUMALOG KWIKPEN 100 UNIT/ML KwikPen Inject into the skin.  . hydroxychloroquine (PLAQUENIL) 200 MG tablet Take 200mg  by mouth twice daily, Monday through Friday only.  Sunday  Insulin Glargine (TOUJEO SOLOSTAR [place]) Inject 45-50 Units into the skin at bedtime.   Marland Kitchen lisinopril (PRINIVIL,ZESTRIL) 20 MG tablet Take 20 mg by mouth daily. Take 1 tablet by mouth in the mornings  . Loratadine (CLARITIN PO) Take 1 tablet by mouth daily as needed.   . Magnesium 250 MG TABS Take 250 mg by mouth daily.  . metFORMIN (GLUCOPHAGE) 1000 MG tablet Take 1,000 mg by  mouth 2 (two) times daily with a meal.  . methocarbamol (ROBAXIN) 500 MG tablet Take 1 tablet by mouth 2 (two) times daily.  . metoprolol tartrate (LOPRESSOR) 50 MG tablet Take 1 tablet (50 mg total) by mouth 2 (two) times daily.  . Multiple Vitamin (MULTIVITAMIN PO) Take by mouth daily.  . Omega-3 Fatty Acids (FISH OIL) 1000 MG CAPS Take 1,000 mg by mouth daily.   Nathen May MAX SOLOSTAR 300 UNIT/ML Solostar Pen Inject into the skin.  Marland Kitchen VITAMIN D PO Take 2,000 mg by mouth.   . [DISCONTINUED] metoprolol tartrate (LOPRESSOR) 25 MG tablet To combine with patient 50 mg tablet, pt will take 75 mg in the morning and 100 mg at bedtime     Allergies  Allergen Reactions  . Influenza Vaccines Swelling    Arm was swollen in 2002   . Sitagliptin Swelling  . Guaifenesin Other (See Comments)    Unknown  . Latex Other (See Comments)    Unknown  . Nitrofurantoin Nausea Only  . Other Other (See Comments)    Band-Aid  . Phenylephrine-Pheniramine-Dm Other (See Comments)    Unknown  . Ultram [Tramadol Hcl] Other (See Comments)    hallucinations  . Escitalopram Oxalate Other (See Comments)    Hallucinations    Social History   Socioeconomic History  . Marital status: Married    Spouse name: Not on file  . Number of children: Not on file  . Years of education: Not on file  . Highest education level: Not on file  Occupational History  . Not on file  Tobacco Use  . Smoking status: Former Smoker    Quit date: 1983    Years since quitting: 39.4  . Smokeless tobacco: Never Used  Vaping Use  . Vaping Use: Never used  Substance and Sexual Activity  . Alcohol use: No  . Drug use: No  . Sexual activity: Not on file  Other Topics Concern  . Not on file  Social History Narrative  . Not on file   Social Determinants of Health   Financial Resource Strain: Not on file  Food Insecurity: Not on file  Transportation Needs: Not on file  Physical Activity: Not on file  Stress: Not on file   Social Connections: Not on file  Intimate Partner Violence: Not on file     Review of Systems: General: negative for chills, fever, night sweats or weight changes.  Cardiovascular: negative for chest pain, dyspnea on exertion, edema, orthopnea, palpitations, paroxysmal nocturnal dyspnea or shortness of breath Dermatological: negative for rash Respiratory: negative for cough or wheezing Urologic: negative for hematuria Abdominal: negative for nausea, vomiting, diarrhea, bright red blood per rectum, melena, or hematemesis Neurologic: negative for visual changes, syncope, or dizziness All other systems reviewed and are otherwise negative except as noted above.    Blood pressure (!) 122/56, pulse 69, height 5\' 3"  (1.6 m), weight 182 lb (82.6 kg), SpO2 98 %.  General appearance: alert and no distress Neck: no adenopathy, no carotid bruit, no JVD, supple, symmetrical, trachea midline and thyroid  not enlarged, symmetric, no tenderness/mass/nodules Lungs: clear to auscultation bilaterally Heart: regular rate and rhythm, S1, S2 normal, no murmur, click, rub or gallop Extremities: extremities normal, atraumatic, no cyanosis or edema Pulses: 2+ and symmetric Skin: Skin color, texture, turgor normal. No rashes or lesions Neurologic: Alert and oriented X 3, normal strength and tone. Normal symmetric reflexes. Normal coordination and gait  EKG not performed today  ASSESSMENT AND PLAN:   Essential hypertension History of essential hypertension a blood pressure measured today at 122/56.  She is on metoprolol and Prinivil.  Hyperlipidemia History of hyperlipidemia on statin therapy with lipid profile performed 12/24/2019 revealing total cholesterol 150, LDL of 83 and HDL 47.  Palpitations History of palpitations with event monitor performed 04/02/2018 that showed episodes of SVT, isolated PACs and PVCs.  She was drinking 4 cups of coffee a day at that time.  After reducing her caffeine intake and  being put on a beta-blocker her palpitations are under much better control.      Runell Gess MD FACP,FACC,FAHA, [facility] 06/30/2020 11:03 AM

## 2020-06-30 NOTE — Assessment & Plan Note (Signed)
History of essential hypertension a blood pressure measured today at 122/56.  She is on metoprolol and Prinivil.

## 2020-06-30 NOTE — Patient Instructions (Signed)
Medication Instructions:  Your physician recommends that you continue on your current medications as directed. Please refer to the Current Medication list given to you today.  *If you need a refill on your cardiac medications before your next appointment, please call your pharmacy*   Follow-Up: At [facility], you and your health needs are our priority.  As part of our continuing mission to provide you with exceptional heart care, we have created designated Provider Care Teams.  These Care Teams include your primary Cardiologist (physician) and Advanced Practice Providers (APPs -  Physician Assistants and Nurse Practitioners) who all work together to provide you with the care you need, when you need it.  We recommend signing up for the patient portal called "MyChart".  Sign up information is provided on this After Visit Summary.  MyChart is used to connect with patients for Virtual Visits (Telemedicine).  Patients are able to view lab/test results, encounter notes, upcoming appointments, etc.  Non-urgent messages can be sent to your provider as well.   To learn more about what you can do with MyChart, go to [email or web address].    Your next appointment:   6 month(s)  The format for your next appointment:   In Person  Provider:   You will see one of the following Advanced Practice Providers on your designated Care Team:   Hao Meng, PA-C  Then, Jonathan Berry, MD will plan to see you again in 12 month(s). 

## 2020-06-30 NOTE — Assessment & Plan Note (Signed)
History of hyperlipidemia on statin therapy with lipid profile performed 12/24/2019 revealing total cholesterol 150, LDL of 83 and HDL 47.

## 2020-06-30 NOTE — Assessment & Plan Note (Signed)
History of palpitations with event monitor performed 04/02/2018 that showed episodes of SVT, isolated PACs and PVCs.  She was drinking 4 cups of coffee a day at that time.  After reducing her caffeine intake and being put on a beta-blocker her palpitations are under much better control.

## 2020-12-29 NOTE — Progress Notes (Deleted)
Cardiology Office Note:    Date:  12/29/2020   ID:  Jill Howard, DOB [birth year], MRN [id number]  PCP:  Drosinis, Leonia Reader, PA-C [facility] Cardiologist: Nanetta Batty, MD   Reason for visit: 83-month follow-up  History of Present Illness:    Jill Howard is a 80 y.o. female with a hx of type 2 diabetes, essential hypertension, hyperlipidemia, fibromyalgia, vascular dementia, stroke 10/24/2016 and migraine headaches.  Zio patch was placed from 03/19/2020 - 04/01/2020 which showed heart rates ranging from 55-1 42 averaging 83, sinus rhythm.  She did have short runs of SVT up to 5 beats with isolated supraventricular extra beats and rare ventricular extra beats.  There was no atrial fibrillation noted.  Symptoms improved with metoprolol.  Palpitations -(***) -History of palpitations with event monitor performed 04/01/2020 that showed episodes of SVT, isolated PACs and PVCs.  She was drinking 4 cups of coffee a day at that time.  After reducing her caffeine intake and being put on a beta-blocker her palpitations are under much better control.  Hypertension -*** -Goal BP is <130/80.  Recommend DASH diet (high in vegetables, fruits, low-fat dairy products, whole grains, poultry, fish, and nuts and low in sweets, sugar-sweetened beverages, and red meats), salt restriction and increase physical activity.  Hyperlipidemia -*** -Discussed cholesterol lowering diets - Mediterranean diet, DASH diet, vegetarian diet, low-carbohydrate diet and avoidance of trans fats.  Discussed healthier choice substitutes.  Nuts, high-fiber foods, and fiber supplements may also improve lipids.    Obesity -Discussed how even a 5-10% weight loss can have cardiovascular benefits.   -Recommend moderate intensity activity for 30 minutes 5 days/week and the DASH diet.   Disposition - Follow-up in ***    Past Medical History:  Diagnosis Date   Diabetes mellitus without complication (HCC)    Gallstones     Hypertension    Kidney stone    Kidney stone    Stroke [facility]) 10/24/1996   Vertigo     Past Surgical History:  Procedure Laterality Date   ABDOMINAL HYSTERECTOMY     KIDNEY STONE SURGERY      Current Medications: No outpatient medications have been marked as taking for the 12/30/20 encounter (Appointment) with Cannon Kettle, PA-C.     Allergies:   Influenza vaccines, Sitagliptin, Guaifenesin, Latex, Nitrofurantoin, Other, Phenylephrine-pheniramine-dm, Ultram [tramadol hcl], and Escitalopram oxalate   Social History   Socioeconomic History   Marital status: Married    Spouse name: Not on file   Number of children: Not on file   Years of education: Not on file   Highest education level: Not on file  Occupational History   Not on file  Tobacco Use   Smoking status: Former    Types: Cigarettes    Quit date: 62    Years since quitting: 39.9   Smokeless tobacco: Never  Vaping Use   Vaping Use: Never used  Substance and Sexual Activity   Alcohol use: No   Drug use: No   Sexual activity: Not on file  Other Topics Concern   Not on file  Social History Narrative   Not on file   Social Determinants of Health   Financial Resource Strain: Not on file  Food Insecurity: Not on file  Transportation Needs: Not on file  Physical Activity: Not on file  Stress: Not on file  Social Connections: Not on file     Family History: The patient's family history includes COPD in her brother; Cancer in an other  family member; Diabetes in her daughter, son, and another family member; Heart attack in her brother, mother, and another family member; Heart disease in her son; Rheum arthritis in her paternal aunt and paternal aunt; Stroke in her brother, maternal aunt, mother, paternal aunt, and another family member.  ROS:   Please see the history of present illness.     EKGs/Labs/Other Studies Reviewed:    EKG:  The ekg ordered today demonstrates ***  Recent Labs: 05/25/2020: ALT  27; BUN 19; Creatinine, Ser 0.74; Hemoglobin 12.7; Platelets 232; Potassium 4.2; Sodium 136   Recent Lipid Panel Lab Results  Component Value Date/Time   CHOL 150 12/24/2019 08:40 AM   TRIG 109 12/24/2019 08:40 AM   HDL 47 12/24/2019 08:40 AM   LDLCALC 83 12/24/2019 08:40 AM    Physical Exam:    VS:  There were no vitals taken for this visit.   No data found.  Wt Readings from Last 3 Encounters:  06/30/20 182 lb (82.6 kg)  05/25/20 176 lb (79.8 kg)  03/25/20 179 lb 6.4 oz (81.4 kg)     GEN: *** Well nourished, well developed in no acute distress HEENT: Normal NECK: No JVD; No carotid bruits CARDIAC: ***RRR, no murmurs, rubs, gallops RESPIRATORY:  Clear to auscultation without rales, wheezing or rhonchi  ABDOMEN: Soft, non-tender, non-distended MUSCULOSKELETAL: No edema; No deformity  SKIN: Warm and dry NEUROLOGIC:  Alert and oriented PSYCHIATRIC:  Normal affect     ASSESSMENT AND PLAN   ***   {Are you ordering a CV Procedure (e.g. stress test, cath, DCCV, TEE, etc)?   Press F2        :[id number]}    Medication Adjustments/Labs and Tests Ordered: Current medicines are reviewed at length with the patient today.  Concerns regarding medicines are outlined above.  No orders of the defined types were placed in this encounter.  No orders of the defined types were placed in this encounter.   There are no Patient Instructions on file for this visit.   Signed, Cannon Kettle, PA-C  12/29/2020 2:52 PM    [facility]

## 2020-12-30 ENCOUNTER — Ambulatory Visit: Payer: Medicare Other | Admitting: Physician Assistant

## 2020-12-30 ENCOUNTER — Ambulatory Visit (INDEPENDENT_AMBULATORY_CARE_PROVIDER_SITE_OTHER): Payer: Medicare Other | Admitting: Cardiovascular Disease

## 2020-12-30 ENCOUNTER — Encounter: Payer: Self-pay | Admitting: Cardiovascular Disease

## 2020-12-30 ENCOUNTER — Other Ambulatory Visit: Payer: Self-pay

## 2020-12-30 VITALS — BP 148/69 | HR 65 | Ht 63.5 in | Wt 185.4 lb

## 2020-12-30 DIAGNOSIS — I10 Essential (primary) hypertension: Secondary | ICD-10-CM

## 2020-12-30 DIAGNOSIS — E782 Mixed hyperlipidemia: Secondary | ICD-10-CM

## 2020-12-30 DIAGNOSIS — R002 Palpitations: Secondary | ICD-10-CM | POA: Diagnosis not present

## 2020-12-30 MED ORDER — METOPROLOL TARTRATE 50 MG PO TABS: 50.0000 mg | ORAL_TABLET | Freq: Two times a day (BID) | ORAL | 3 refills | Status: DC

## 2020-12-30 MED ORDER — EZETIMIBE 10 MG PO TABS: 10.0000 mg | ORAL_TABLET | Freq: Every day | ORAL | 3 refills | Status: DC

## 2020-12-30 MED ORDER — METOPROLOL TARTRATE 25 MG PO TABS: 25.0000 mg | ORAL_TABLET | Freq: Two times a day (BID) | ORAL | 3 refills | Status: DC

## 2020-12-30 NOTE — Assessment & Plan Note (Signed)
History of palpitations found to be PACs, PVCs and short runs of SVT improved on beta-blockade.  She has decreased her caffeine intake as well.

## 2020-12-30 NOTE — Assessment & Plan Note (Signed)
History of hyperlipidemia on statin therapy with lipid profile performed 12/24/2019 revealing total cholesterol of 150, LDL 83 and HDL 47.  I am going to repeat a fasting lipid liver profile.

## 2020-12-30 NOTE — Patient Instructions (Signed)
Medication Instructions:  Your physician recommends that you continue on your current medications as directed. Please refer to the Current Medication list given to you today.  *If you need a refill on your cardiac medications before your next appointment, please call your pharmacy*   Lab Work: Your physician recommends that you return for lab work in: next week or 2 for FASTING lipid/liver profile.  If you have labs (blood work) drawn today and your tests are completely normal, you will receive your results only by: MyChart Message (if you have MyChart) OR A paper copy in the mail If you have any lab test that is abnormal or we need to change your treatment, we will call you to review the results.   Follow-Up: At [facility], you and your health needs are our priority.  As part of our continuing mission to provide you with exceptional heart care, we have created designated Provider Care Teams.  These Care Teams include your primary Cardiologist (physician) and Advanced Practice Providers (APPs -  Physician Assistants and Nurse Practitioners) who all work together to provide you with the care you need, when you need it.  We recommend signing up for the patient portal called "MyChart".  Sign up information is provided on this After Visit Summary.  MyChart is used to connect with patients for Virtual Visits (Telemedicine).  Patients are able to view lab/test results, encounter notes, upcoming appointments, etc.  Non-urgent messages can be sent to your provider as well.   To learn more about what you can do with MyChart, go to [email or web address].    Your next appointment:   12 month(s)  The format for your next appointment:   In Person  Provider:   Jonathan Berry, MD  

## 2020-12-30 NOTE — Assessment & Plan Note (Signed)
History of essential hypertension a blood pressure measured today at 122/56.  She is on amlodipine, lisinopril and metoprolol.

## 2020-12-30 NOTE — Progress Notes (Signed)
12/30/2020 Jill Howard   [birth year]  [id number]  Primary Physician Drosinis, Leonia Reader, PA-C Primary Cardiologist: Runell Gess MD Nicholes Calamity, [place]  HPI:  Jill Howard is a 80 y.o.  married Caucasian female mother of 2 children, grandmother of 4 grandchildren referred by Hayden Rasmussen, PA-C for cardiovascular valuation because of palpitations.  I last saw her in the office   06/30/2020. Her cardiac risk factors are notable for type 2 diabetes, essential hypertension, hyperlipidemia, fibromyalgia and vascular dementia.  She has had a stroke 10/24/2016 and suffers from migraine headaches as well.  She does not smoke.  She is never had a heart attack.  She denies chest pain.  She gets occasional shortness of breath when walking and has occasional lower extremity edema in her feet.  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  She apparently had an office visit with Hayden Rasmussen 03/12/2018 at which time she was complaining of upper respiratory tract symptoms and was treated with Augmentin.  She was also complaining of palpitations at that time.  AZO patch was placed from 03/19/2020 - 04/02/2018 which showed heart rates ranging from 55-1 42 averaging 83, sinus rhythm.  She did have short runs of SVT up to 5 beats with isolated supraventricular extra beats and rare ventricular extra beats.  There was no atrial fibrillation noted.  She does admit to drinking 4 cups of coffee a day.  She is not very symptomatic from these palpitations other than she notices them.   She saw Azalee Course , PA-C in the office 07/01/2018 who placed her on low-dose beta-blocker just resulted in complete resolution of her symptoms.  Blood work was drawn that revealed a normal TSH.  We talked about the importance of reducing her caffeine intake as well.    She did receive her second dose of the Moderna vaccine on May 10.  Since that time she is noticed increasing arthritic pain, some lower extremity edema,  and increased palpitations.  She also has noticed some increased arthritic symptoms which she attributes to her second Materna dose. She has decreased her caffeine intake from 4 cups to 2 cups of coffee a day   Since I saw her 6 months ago she continues to have arthritic type symptoms especially involving her hips.    She continues to take her beta-blocker which has improved her palpitations.  Her major complaint is of profound fatigue, although this seems to have improved over time.   Current Meds  Medication Sig   amLODipine (NORVASC) 10 MG tablet Take 10 mg by mouth daily.   aspirin 325 MG tablet Take 325 mg by mouth daily.   atorvastatin (LIPITOR) 80 MG tablet Take 1 tablet (80 mg total) by mouth at bedtime.   cetirizine (ZYRTEC) 10 MG tablet Take 10 mg by mouth daily.   clopidogrel (PLAVIX) 75 MG tablet Take 1 tablet (75 mg total) by mouth daily.   Continuous Blood Gluc Transmit (DEXCOM G6 TRANSMITTER) MISC Use as directed for continuous glucose monitoring. Reuse transmitter for 90 days then discard and replace.   Cyanocobalamin (VITAMIN B-12 PO) Take 5,000 mg by mouth daily.   diclofenac sodium (VOLTAREN) 1 % GEL Apply 2 g topically as needed.    diphenhydrAMINE (BENADRYL) 25 MG tablet Take 25 mg by mouth at bedtime as needed.   Dulaglutide 1.5 MG/0.5ML SOPN Inject 1.5 mg into the skin once a week. On Tuesday   glimepiride (AMARYL) 4 MG tablet  Take 4 mg by mouth 2 (two) times daily.   HUMALOG KWIKPEN 100 UNIT/ML KwikPen Inject into the skin.   Insulin Glargine (TOUJEO SOLOSTAR [place]) Inject 45-50 Units into the skin at bedtime.    lisinopril (PRINIVIL,ZESTRIL) 20 MG tablet Take 20 mg by mouth daily. Take 1 tablet by mouth in the mornings   Loratadine (CLARITIN PO) Take 1 tablet by mouth daily as needed.    Magnesium 250 MG TABS Take 250 mg by mouth daily.   metFORMIN (GLUCOPHAGE) 1000 MG tablet Take 1,000 mg by mouth 2 (two) times daily with a meal.   metFORMIN (GLUCOPHAGE) 1000 MG tablet  Take 1 tablet by mouth 2 (two) times daily with a meal.   methocarbamol (ROBAXIN) 500 MG tablet Take 1 tablet by mouth 2 (two) times daily.   metoprolol tartrate (LOPRESSOR) 50 MG tablet Take 1 tablet (50 mg total) by mouth 2 (two) times daily.   Multiple Vitamin (MULTIVITAMIN PO) Take by mouth daily.   Omega-3 Fatty Acids (FISH OIL) 1000 MG CAPS Take 1,000 mg by mouth daily.    TOUJEO MAX SOLOSTAR 300 UNIT/ML Solostar Pen Inject into the skin.   VITAMIN D PO Take 2,000 mg by mouth.    [DISCONTINUED] hydroxychloroquine (PLAQUENIL) 200 MG tablet Take 200mg  by mouth twice daily, Monday through Friday only.     Allergies  Allergen Reactions   Influenza Vaccines Swelling    Arm was swollen in 2002    Sitagliptin Swelling   Guaifenesin Other (See Comments)    Unknown   Latex Other (See Comments)    Unknown   Nitrofurantoin Nausea Only   Other Other (See Comments)    Band-Aid   Phenylephrine-Pheniramine-Dm Other (See Comments)    Unknown   Ultram [Tramadol Hcl] Other (See Comments)    hallucinations   Escitalopram Oxalate Other (See Comments)    Hallucinations    Social History   Socioeconomic History   Marital status: Married    Spouse name: Not on file   Number of children: Not on file   Years of education: Not on file   Highest education level: Not on file  Occupational History   Not on file  Tobacco Use   Smoking status: Former    Types: Cigarettes    Quit date: 55    Years since quitting: 39.9   Smokeless tobacco: Never  Vaping Use   Vaping Use: Never used  Substance and Sexual Activity   Alcohol use: No   Drug use: No   Sexual activity: Not on file  Other Topics Concern   Not on file  Social History Narrative   Not on file   Social Determinants of Health   Financial Resource Strain: Not on file  Food Insecurity: Not on file  Transportation Needs: Not on file  Physical Activity: Not on file  Stress: Not on file  Social Connections: Not on file   Intimate Partner Violence: Not on file     Review of Systems: General: negative for chills, fever, night sweats or weight changes.  Cardiovascular: negative for chest pain, dyspnea on exertion, edema, orthopnea, palpitations, paroxysmal nocturnal dyspnea or shortness of breath Dermatological: negative for rash Respiratory: negative for cough or wheezing Urologic: negative for hematuria Abdominal: negative for nausea, vomiting, diarrhea, bright red blood per rectum, melena, or hematemesis Neurologic: negative for visual changes, syncope, or dizziness All other systems reviewed and are otherwise negative except as noted above.    Blood pressure (!) 148/69, pulse 65, height  5' 3.5" (1.613 m), weight 185 lb 6.4 oz (84.1 kg), SpO2 98 %.  General appearance: alert and no distress Neck: no adenopathy, no carotid bruit, no JVD, supple, symmetrical, trachea midline, and thyroid not enlarged, symmetric, no tenderness/mass/nodules Lungs: clear to auscultation bilaterally Heart: regular rate and rhythm, S1, S2 normal, no murmur, click, rub or gallop Extremities: extremities normal, atraumatic, no cyanosis or edema Pulses: 2+ and symmetric Skin: Skin color, texture, turgor normal. No rashes or lesions Neurologic: Grossly normal  EKG NSR 65 w/o STTTWC. I personally reviewed this EKG/  ASSESSMENT AND PLAN:   Essential hypertension History of essential hypertension a blood pressure measured today at 122/56.  She is on amlodipine, lisinopril and metoprolol.  Hyperlipidemia History of hyperlipidemia on statin therapy with lipid profile performed 12/24/2019 revealing total cholesterol of 150, LDL 83 and HDL 47.  I am going to repeat a fasting lipid liver profile.  Palpitations History of palpitations found to be PACs, PVCs and short runs of SVT improved on beta-blockade.  She has decreased her caffeine intake as well.     Lorretta Harp MD FACP,FACC,FAHA, [facility] 12/30/2020 11:15 AM

## 2021-01-19 ENCOUNTER — Other Ambulatory Visit: Payer: Self-pay | Admitting: Cardiovascular Disease

## 2021-01-26 ENCOUNTER — Ambulatory Visit: Payer: Medicare Other | Admitting: Cardiovascular Disease

## 2021-06-03 ENCOUNTER — Other Ambulatory Visit: Payer: Self-pay | Admitting: Gastroenterology

## 2021-06-05 ENCOUNTER — Telehealth: Payer: Self-pay

## 2021-06-05 NOTE — Telephone Encounter (Signed)
? ?  Pre-operative Risk Assessment  ?  ?Patient Name: Jill Howard  ?DOB: 09/15/1940 ?MRN: [id number]  ? ?  ? ?Request for Surgical Clearance   ? ?Procedure:   Colonoscopy/endoscopy ? ?Date of Surgery:  Clearance 09/29/21                              ?   ?Surgeon:  Dr. Dulce Sellar ?Surgeon's Group or Practice Name:  [facility]  ?Phone number:  [phone] ?Fax number:  [phone] ?  ?Type of Clearance Requested:   ?- Pharmacy:  Hold Clopidogrel (Plavix)   ?  ?Type of Anesthesia:   Propofol ?  ?Additional requests/questions:   ? ?Signed, ?Dorris Fetch   ?06/05/2021, 12:44 PM  ? ?

## 2021-06-07 NOTE — Telephone Encounter (Signed)
? ? ?  Patient Name: Jill Howard  ?DOB: 1940/11/28 ?MRN: [id number] ? ?Primary Cardiologist: Quay Burow, MD ? ?Chart reviewed as part of pre-operative protocol coverage. Received pharmacy clearance request for colonoscopy/ endoscopy scheduled for 09/29/2021 with request to hold Plavix prior to procedure.  ? ?Patient takes Plavix for history of CVA. In this case, cardiology does not manage patient's Plavix. Requesting party, please reach out to prescribing provider for further recommendations on holding Plavix prior to procedure. ? ?I will route this recommendation to the requesting party via Epic fax function and remove from pre-op pool. ? ?Please call with questions. ? ?Lenna Sciara, NP ?06/07/2021, 1:43 PM  ?

## 2021-06-16 ENCOUNTER — Other Ambulatory Visit: Payer: Self-pay | Admitting: Physician Assistant

## 2021-08-26 ENCOUNTER — Emergency Department (HOSPITAL_BASED_OUTPATIENT_CLINIC_OR_DEPARTMENT_OTHER)
Admission: EM | Admit: 2021-08-26 | Discharge: 2021-08-26 | Disposition: A | Discharge status: Home / Self Care | Payer: Medicare Other | Attending: Emergency Medicine | Admitting: Emergency Medicine

## 2021-08-26 ENCOUNTER — Emergency Department (HOSPITAL_BASED_OUTPATIENT_CLINIC_OR_DEPARTMENT_OTHER): Payer: Medicare Other

## 2021-08-26 ENCOUNTER — Encounter (HOSPITAL_BASED_OUTPATIENT_CLINIC_OR_DEPARTMENT_OTHER): Payer: Self-pay | Admitting: Emergency Medicine

## 2021-08-26 ENCOUNTER — Other Ambulatory Visit: Payer: Self-pay

## 2021-08-26 DIAGNOSIS — R1013 Epigastric pain: Secondary | ICD-10-CM | POA: Diagnosis not present

## 2021-08-26 DIAGNOSIS — E119 Type 2 diabetes mellitus without complications: Secondary | ICD-10-CM | POA: Insufficient documentation

## 2021-08-26 DIAGNOSIS — Z7984 Long term (current) use of oral hypoglycemic drugs: Secondary | ICD-10-CM | POA: Diagnosis not present

## 2021-08-26 DIAGNOSIS — R0602 Shortness of breath: Secondary | ICD-10-CM | POA: Insufficient documentation

## 2021-08-26 DIAGNOSIS — I10 Essential (primary) hypertension: Secondary | ICD-10-CM | POA: Diagnosis not present

## 2021-08-26 DIAGNOSIS — Z79899 Other long term (current) drug therapy: Secondary | ICD-10-CM | POA: Insufficient documentation

## 2021-08-26 DIAGNOSIS — Z7982 Long term (current) use of aspirin: Secondary | ICD-10-CM | POA: Insufficient documentation

## 2021-08-26 DIAGNOSIS — R079 Chest pain, unspecified: Secondary | ICD-10-CM | POA: Diagnosis not present

## 2021-08-26 DIAGNOSIS — Z91040 Latex allergy status: Secondary | ICD-10-CM | POA: Insufficient documentation

## 2021-08-26 DIAGNOSIS — Z794 Long term (current) use of insulin: Secondary | ICD-10-CM | POA: Diagnosis not present

## 2021-08-26 LAB — HEPATIC FUNCTION PANEL
ALT: 24 U/L (ref 0–44)
AST: 26 U/L (ref 15–41)
Albumin: 3.1 g/dL — ABNORMAL LOW (ref 3.5–5.0)
Alkaline Phosphatase: 68 U/L (ref 38–126)
Bilirubin, Direct: <0.1 mg/dL (ref 0.0–0.2)
Total Bilirubin: 0.6 mg/dL (ref 0.3–1.2)
Total Protein: 6.9 g/dL (ref 6.5–8.1)

## 2021-08-26 LAB — CBC
HCT: 34.3 % — ABNORMAL LOW (ref 36.0–46.0)
Hemoglobin: 11.4 g/dL — ABNORMAL LOW (ref 12.0–15.0)
MCH: 29.3 pg (ref 26.0–34.0)
MCHC: 33.2 g/dL (ref 30.0–36.0)
MCV: 88.2 fL (ref 80.0–100.0)
Platelets: 208 10*3/uL (ref 150–400)
RBC: 3.89 MIL/uL (ref 3.87–5.11)
RDW: 13.4 % (ref 11.5–15.5)
WBC: 6.8 10*3/uL (ref 4.0–10.5)
nRBC: 0 % (ref 0.0–0.2)

## 2021-08-26 LAB — BASIC METABOLIC PANEL
Anion gap: 8 (ref 5–15)
BUN: 16 mg/dL (ref 8–23)
CO2: 23 mmol/L (ref 22–32)
Calcium: 9.4 mg/dL (ref 8.9–10.3)
Chloride: 105 mmol/L (ref 98–111)
Creatinine, Ser: 0.71 mg/dL (ref 0.44–1.00)
GFR, Estimated: >60 mL/min (ref >60)
Glucose, Bld: 292 mg/dL — ABNORMAL HIGH (ref 70–99)
Potassium: 3.9 mmol/L (ref 3.5–5.1)
Sodium: 136 mmol/L (ref 135–145)

## 2021-08-26 LAB — TROPONIN I (HIGH SENSITIVITY)
Troponin I (High Sensitivity): 6 ng/L (ref <18)
Troponin I (High Sensitivity): 11 ng/L (ref <18)

## 2021-08-26 LAB — LIPASE, BLOOD: Lipase: 75 U/L — ABNORMAL HIGH (ref 11–51)

## 2021-08-26 MED ORDER — ALUM & MAG HYDROXIDE-SIMETH 200-200-20 MG/5ML PO SUSP
30.0000 mL | Freq: Once | ORAL | Status: AC
  Administered 2021-08-26: 30 mL via ORAL
  Filled 2021-08-26: qty 30

## 2021-08-26 NOTE — ED Notes (Signed)
Patient transported to X-ray 

## 2021-08-26 NOTE — Discharge Instructions (Signed)
Try tums over the next few days and over the counter maalox.  You can take prilosec for 2 week course.  If you start having worsening symptoms, passing out, vomiting, unable to hold anything down or fever return to the ER.

## 2021-08-26 NOTE — ED Provider Notes (Addendum)
[facility] EMERGENCY DEPARTMENT Provider Note   CSN: [id number] Arrival date & time: 08/26/21  1904     History  Chief Complaint  Patient presents with   Shortness of Breath   Chest Pain    Jill Howard is a 81 y.o. female.  Patient is a 81 year old female with a history of diabetes, hypertension, stroke, vertigo, SVT and gallstones who is presenting today with complaints of sudden onset of chest pain and shortness of breath.  Patient reports today had been a normal day for her they were eating at [facility] and she had no issues while she was eating.  They were walking back to the car when she suddenly developed the severe pain in the lower portion of her chest and shortness of breath that made her stop.  She reports this pain started approximately 20 minutes ago and it is improved from what it had been when it first started but is still present.  It does not seem worse when she takes a deep breath or leans forward.  She is also having some mild abdominal discomfort but she reports she has been having issues with her abdomen due to a medication reaction that she had recently discontinued.  She has had no nausea or vomiting.  She denies any diarrhea.  She has not had significant cough, congestion or cold symptoms.  The history is provided by the patient and medical records.  Shortness of Breath Associated symptoms: chest pain   Chest Pain Associated symptoms: shortness of breath        Home Medications Prior to Admission medications   Medication Sig Start Date End Date Taking? Authorizing Provider  amLODipine (NORVASC) 10 MG tablet TAKE ONE (1) TABLET BY MOUTH EVERY DAY 01/19/21   Runell Gess, MD  aspirin 325 MG tablet Take 325 mg by mouth daily.    [provider]  atorvastatin (LIPITOR) 80 MG tablet Take 1 tablet (80 mg total) by mouth at bedtime. 10/25/16   Rai, Delene Ruffini, MD  cetirizine (ZYRTEC) 10 MG tablet Take 10 mg by mouth daily.    [provider]  clopidogrel (PLAVIX) 75 MG tablet Take 1 tablet (75 mg total) by mouth daily. 10/26/16   Rai, Ripudeep K, MD  Continuous Blood Gluc Transmit (DEXCOM G6 TRANSMITTER) MISC Use as directed for continuous glucose monitoring. Reuse transmitter for 90 days then discard and replace. 11/27/19   [provider]  Cyanocobalamin (VITAMIN B-12 PO) Take 5,000 mg by mouth daily.    [provider]  diclofenac sodium (VOLTAREN) 1 % GEL Apply 2 g topically as needed.  11/03/17   [provider]  diphenhydrAMINE (BENADRYL) 25 MG tablet Take 25 mg by mouth at bedtime as needed.    [provider]  Dulaglutide 1.5 MG/0.5ML SOPN Inject 1.5 mg into the skin once a week. On Tuesday    [provider]  ezetimibe (ZETIA) 10 MG tablet Take 1 tablet (10 mg total) by mouth daily. 12/30/20   Runell Gess, MD  glimepiride (AMARYL) 4 MG tablet Take 4 mg by mouth 2 (two) times daily. 03/28/19   [provider]  HUMALOG KWIKPEN 100 UNIT/ML KwikPen Inject into the skin. 06/25/20   [provider]  Insulin Glargine (TOUJEO SOLOSTAR [place]) Inject 45-50 Units into the skin at bedtime.     [provider]  lisinopril (PRINIVIL,ZESTRIL) 20 MG tablet Take 20 mg by mouth daily. Take 1 tablet by mouth in the mornings 09/09/16  [provider]  Loratadine (CLARITIN PO) Take 1 tablet by mouth daily as needed.     [provider]  Magnesium 250 MG TABS Take 250 mg by mouth daily.    [provider]  metFORMIN (GLUCOPHAGE) 1000 MG tablet Take 1,000 mg by mouth 2 (two) times daily with a meal.    [provider]  metFORMIN (GLUCOPHAGE) 1000 MG tablet Take 1 tablet by mouth 2 (two) times daily with a meal. 12/01/20   [provider]  methocarbamol (ROBAXIN) 500 MG tablet Take 1 tablet by mouth 2 (two) times daily. 04/16/20   [provider]  metoprolol tartrate (LOPRESSOR) 25 MG tablet TAKE 1 TABLET IN THE  MORNING AND 2 AT BEDTIME ALONG WITH 50MG  TABLET FOR TOTALOF 75MG  IN THE MORNING AND 100MG  AT BEDTIME 06/17/21   , PA  metoprolol tartrate (LOPRESSOR) 50 MG tablet Take 1 tablet (50 mg total) by mouth 2 (two) times daily. 12/30/20   06/19/21, MD  Multiple Vitamin (MULTIVITAMIN PO) Take by mouth daily.    [provider]  Omega-3 Fatty Acids (FISH OIL) 1000 MG CAPS Take 1,000 mg by mouth daily.     [provider]  TOUJEO MAX SOLOSTAR 300 UNIT/ML Solostar Pen Inject into the skin. 03/18/20   [provider]  VITAMIN D PO Take 2,000 mg by mouth.     [provider]      Allergies    Influenza vaccines, Sitagliptin, Guaifenesin, Latex, Nitrofurantoin, Other, Phenylephrine-pheniramine-dm, Ultram [tramadol hcl], and Escitalopram oxalate    Review of Systems   Review of Systems  Respiratory:  Positive for shortness of breath.   Cardiovascular:  Positive for chest pain.    Physical Exam Updated Vital Signs BP (!) 146/52   Pulse 73   Temp 97.7 F (36.5 C) (Oral)   Resp 19   Ht 5' 3.5" (1.613 m)   Wt 80.7 kg   SpO2 96%   BMI 31.04 kg/m  Physical Exam Vitals and nursing note reviewed.  Constitutional:      General: She is in acute distress.     Appearance: She is well-developed.     Comments: Appears uncomfortable  HENT:     Head: Normocephalic and atraumatic.  Eyes:     Pupils: Pupils are equal, round, and reactive to light.  Cardiovascular:     Rate and Rhythm: Normal rate and regular rhythm.     Heart sounds: Normal heart sounds. No murmur heard.    No friction rub.  Pulmonary:     Effort: Pulmonary effort is normal.     Breath sounds: Normal breath sounds. No wheezing or rales.  Abdominal:     General: Bowel sounds are normal. There is no distension.     Palpations: Abdomen is soft.     Tenderness: There is abdominal tenderness in the epigastric area. There is no guarding or rebound.  Musculoskeletal:        General: No  tenderness. Normal range of motion.     Left lower leg: Edema present.     Comments: Pitting edema in the left only.  Skin:    General: Skin is warm and dry.     Findings: No rash.  Neurological:     Mental Status: She is alert and oriented to person, place, and time. Mental status is at baseline.     Cranial Nerves: No cranial nerve deficit.  Psychiatric:        Mood and Affect:  Mood normal.        Behavior: Behavior normal.     ED Results / Procedures / Treatments   Labs (all labs ordered are listed, but only abnormal results are displayed) Labs Reviewed  BASIC METABOLIC PANEL - Abnormal; Notable for the following components:      Result Value   Glucose, Bld 292 (*)    All other components within normal limits  CBC - Abnormal; Notable for the following components:   Hemoglobin 11.4 (*)    HCT 34.3 (*)    All other components within normal limits  HEPATIC FUNCTION PANEL - Abnormal; Notable for the following components:   Albumin 3.1 (*)    All other components within normal limits  LIPASE, BLOOD - Abnormal; Notable for the following components:   Lipase 75 (*)    All other components within normal limits  TROPONIN I (HIGH SENSITIVITY)  TROPONIN I (HIGH SENSITIVITY)    EKG EKG Interpretation  Date/Time:  Thursday August 26 2021 19:13:41 EDT Ventricular Rate:  82 PR Interval:  190 QRS Duration: 88 QT Interval:  388 QTC Calculation: 453 R Axis:   0 Text Interpretation: Normal sinus rhythm Nonspecific ST and T wave abnormality When compared with ECG of 25-May-2020 13:26, PREVIOUS ECG IS PRESENT Confirmed by Gwyneth Sprout ([id number]) on 08/26/2021 7:42:05 PM  Radiology US Abdomen Limited RUQ (LIVER/GB)  Result Date: 08/26/2021 CLINICAL DATA:  Epigastric abdominal pain EXAM: ULTRASOUND ABDOMEN LIMITED RIGHT UPPER QUADRANT COMPARISON:  None Available. FINDINGS: Gallbladder: Multiple layering gallstones are seen within the gallbladder. The gallbladder, however, is not  distended, there is no gallbladder wall thickening, and no pericholecystic fluid is identified. The sonographic Eulah Pont sign is reportedly negative. Common bile duct: Diameter: 3 mm in proximal diameter Liver: Hepatic parenchymal echogenicity is diffusely increased and the hepatic echotexture is diffusely coarsened in keeping with moderate hepatic steatosis. 11 mm simple cyst noted within the left hepatic lobe, benign. No focal intrahepatic masses are seen and there is no intrahepatic biliary ductal dilation. Portal vein is patent on color Doppler imaging with normal direction of blood flow towards the liver. Other: None. IMPRESSION: 1. Cholelithiasis without sonographic evidence of acute cholecystitis. 2. Moderate hepatic steatosis. Electronically Signed   By: Helyn Numbers M.D.   On: 08/26/2021 23:03   DG Chest 2 View  Result Date: 08/26/2021 CLINICAL DATA:  Dyspnea, chest pain EXAM: CHEST - 2 VIEW COMPARISON:  03/01/2019 FINDINGS: Lungs are well expanded, symmetric, and clear. No pneumothorax or pleural effusion. Cardiac size within normal limits. Pulmonary vascularity is normal. Osseous structures are age-appropriate. No acute bone abnormality. IMPRESSION: No active cardiopulmonary disease. Electronically Signed   By: Helyn Numbers M.D.   On: 08/26/2021 19:43    Procedures Procedures    Medications Ordered in ED Medications  alum & mag hydroxide-simeth (MAALOX/MYLANTA) 200-200-20 MG/5ML suspension 30 mL (30 mLs Oral Given 08/26/21 2002)    ED Course/ Medical Decision Making/ A&P                           Medical Decision Making Amount and/or Complexity of Data Reviewed External Data Reviewed: notes. Labs: ordered. Decision-making details documented in ED Course. Radiology: ordered and independent interpretation performed. Decision-making details documented in ED Course. ECG/medicine tests: ordered and independent interpretation performed. Decision-making details documented in ED  Course.  Risk OTC drugs.   Pt with multiple medical problems and comorbidities and presenting today with a complaint that caries a high  risk for morbidity and mortality.  Here today with complaint of sudden onset of chest pain 20 minutes prior to arrival.  The pain is starting to improve.  Patient's vital signs are normal except for mild hypertension.  She does have some abdominal tenderness and appears uncomfortable.  Her breath sounds are clear bilaterally.  Low suspicion for pneumothorax.  Concern for ACS versus acute GI cause such as esophageal spasm or gallstones given symptoms were right after she ate at [facility] versed PE.  Low suspicion for dissection at this time.  Patient given Maalox.  I independently interpreted patient's EKG today that showed no acute findings.  No evidence of STEMI.  I have independently visualized and interpreted pt's images today.  Chest x-ray within normal limits. 11:09 PM I independently interpreted patient's labs today and CBC, CMP without acute findings for high glycemia of 292.  Initial troponin is within normal limits and lipase is mildly elevated at 75.  In the past patient has had history of gallstones but on repeat evaluation after having Maalox patient reports the pain in the front is completely gone.  She has a mild ache in her back.  We will need to do a delta troponin as patient's pain had only been present for 20 minutes prior to arrival.  11:09 PM Pt's RUQ u/s showed cholelithiasis but no retained stones at this time or dilated CBD.  Delta trop pending.  When pt walked to bathroom she had mild return of pain that resolved with sitting down.  Pt's delta trop is 11 from 6.  No significant change.  Pt reports still a burning in the stomach and chest that feels like acid.  Low suspicion at this time for ACS.  Pt well appearing.  Will treat symptomatically but given return precautions.  Discussed findings with pt and her family.  They are comfortable with  this plan.      Final Clinical Impression(s) / ED Diagnoses Final diagnoses:  None    Rx / DC Orders ED Discharge Orders     None         Gwyneth Sprout, MD 08/26/21 2310    Gwyneth Sprout, MD 08/26/21 2332

## 2021-08-26 NOTE — ED Notes (Signed)
ED Provider at bedside. 

## 2021-08-26 NOTE — ED Triage Notes (Signed)
Pt c/o central chest pain and shob that started 10 minutes pta. Denies n/v, diaphoresis, pain radiation. Reports pain began after eating.

## 2021-08-26 NOTE — ED Notes (Signed)
Pt discharged to home. Discharge instructions have been discussed with patient and/or family members. Pt verbally acknowledges understanding d/c instructions, and endorses comprehension to checkout at registration before leaving.  °

## 2021-08-26 NOTE — ED Notes (Signed)
Pt endorses that she got up to bathroom and had burning in chest. EDP Plunkett notified. NSR on cardiac monitor

## 2021-09-29 ENCOUNTER — Encounter (HOSPITAL_COMMUNITY): Admission: RE | Payer: Self-pay | Source: Home / Self Care

## 2021-09-29 ENCOUNTER — Ambulatory Visit (HOSPITAL_COMMUNITY): Admission: RE | Admit: 2021-09-29 | Payer: Medicare Other | Source: Home / Self Care | Admitting: Gastroenterology

## 2021-09-29 SURGERY — COLONOSCOPY WITH PROPOFOL
Anesthesia: Monitor Anesthesia Care | Laterality: Bilateral

## 2021-10-28 ENCOUNTER — Emergency Department (HOSPITAL_COMMUNITY): Payer: Medicare Other

## 2021-10-28 ENCOUNTER — Encounter (HOSPITAL_COMMUNITY): Payer: Self-pay | Admitting: Emergency Medicine

## 2021-10-28 ENCOUNTER — Emergency Department (HOSPITAL_COMMUNITY)
Admission: EM | Admit: 2021-10-28 | Discharge: 2021-10-28 | Disposition: A | Discharge status: Home / Self Care | Payer: Medicare Other | Attending: Emergency Medicine | Admitting: Emergency Medicine

## 2021-10-28 DIAGNOSIS — S0993XA Unspecified injury of face, initial encounter: Secondary | ICD-10-CM | POA: Diagnosis present

## 2021-10-28 DIAGNOSIS — Z7901 Long term (current) use of anticoagulants: Secondary | ICD-10-CM | POA: Insufficient documentation

## 2021-10-28 DIAGNOSIS — W01198A Fall on same level from slipping, tripping and stumbling with subsequent striking against other object, initial encounter: Secondary | ICD-10-CM | POA: Insufficient documentation

## 2021-10-28 DIAGNOSIS — Z7982 Long term (current) use of aspirin: Secondary | ICD-10-CM | POA: Diagnosis not present

## 2021-10-28 DIAGNOSIS — Z91040 Latex allergy status: Secondary | ICD-10-CM | POA: Diagnosis not present

## 2021-10-28 DIAGNOSIS — S0081XA Abrasion of other part of head, initial encounter: Secondary | ICD-10-CM | POA: Diagnosis not present

## 2021-10-28 DIAGNOSIS — Z8673 Personal history of transient ischemic attack (TIA), and cerebral infarction without residual deficits: Secondary | ICD-10-CM | POA: Diagnosis not present

## 2021-10-28 DIAGNOSIS — R930 Abnormal findings on diagnostic imaging of skull and head, not elsewhere classified: Secondary | ICD-10-CM | POA: Diagnosis not present

## 2021-10-28 DIAGNOSIS — Z7902 Long term (current) use of antithrombotics/antiplatelets: Secondary | ICD-10-CM | POA: Insufficient documentation

## 2021-10-28 DIAGNOSIS — Z7984 Long term (current) use of oral hypoglycemic drugs: Secondary | ICD-10-CM | POA: Diagnosis not present

## 2021-10-28 DIAGNOSIS — Z794 Long term (current) use of insulin: Secondary | ICD-10-CM | POA: Insufficient documentation

## 2021-10-28 DIAGNOSIS — W19XXXA Unspecified fall, initial encounter: Secondary | ICD-10-CM

## 2021-10-28 DIAGNOSIS — Y9301 Activity, walking, marching and hiking: Secondary | ICD-10-CM | POA: Diagnosis not present

## 2021-10-28 LAB — CBC
HCT: 36.8 % (ref 36.0–46.0)
Hemoglobin: 11.7 g/dL — ABNORMAL LOW (ref 12.0–15.0)
MCH: 28.7 pg (ref 26.0–34.0)
MCHC: 31.8 g/dL (ref 30.0–36.0)
MCV: 90.4 fL (ref 80.0–100.0)
Platelets: 205 10*3/uL (ref 150–400)
RBC: 4.07 MIL/uL (ref 3.87–5.11)
RDW: 13.4 % (ref 11.5–15.5)
WBC: 6.3 10*3/uL (ref 4.0–10.5)
nRBC: 0 % (ref 0.0–0.2)

## 2021-10-28 LAB — COMPREHENSIVE METABOLIC PANEL
ALT: 26 U/L (ref 0–44)
AST: 23 U/L (ref 15–41)
Albumin: 3.1 g/dL — ABNORMAL LOW (ref 3.5–5.0)
Alkaline Phosphatase: 64 U/L (ref 38–126)
Anion gap: 11 (ref 5–15)
BUN: 14 mg/dL (ref 8–23)
CO2: 22 mmol/L (ref 22–32)
Calcium: 9.7 mg/dL (ref 8.9–10.3)
Chloride: 103 mmol/L (ref 98–111)
Creatinine, Ser: 0.75 mg/dL (ref 0.44–1.00)
GFR, Estimated: >60 mL/min (ref >60)
Glucose, Bld: 262 mg/dL — ABNORMAL HIGH (ref 70–99)
Potassium: 3.9 mmol/L (ref 3.5–5.1)
Sodium: 136 mmol/L (ref 135–145)
Total Bilirubin: 0.7 mg/dL (ref 0.3–1.2)
Total Protein: 6.9 g/dL (ref 6.5–8.1)

## 2021-10-28 LAB — SAMPLE TO BLOOD BANK

## 2021-10-28 LAB — PROTIME-INR
INR: 1.1 (ref 0.8–1.2)
Prothrombin Time: 14.4 seconds (ref 11.4–15.2)

## 2021-10-28 MED ORDER — ACETAMINOPHEN 500 MG PO TABS
500.0000 mg | ORAL_TABLET | Freq: Once | ORAL | Status: AC
Start: 2021-10-28 — End: 2021-10-28
  Administered 2021-10-28: 500 mg via ORAL
  Filled 2021-10-28: qty 1

## 2021-10-28 NOTE — Discharge Instructions (Addendum)
We saw you in the ER after you had a fall. °All the imaging results are normal, no fractures seen. No evidence of brain bleed. °Please be very careful with walking, and do everything possible to prevent falls. ° ° °

## 2021-10-28 NOTE — ED Provider Notes (Signed)
[facility] EMERGENCY DEPARTMENT Provider Note   CSN: [id number] Arrival date & time: 10/28/21  3810     History  Chief Complaint  Patient presents with   Lytle Michaels    Jill Howard is a 81 y.o. female.  HPI     81 year old female comes in with chief complaint of fall.  Patient is on Eliquis for stroke prevention.  She indicates that she [place], [place]pped and fell forward.  She struck her head in the process.  She is having primarily pain over her face.  She denies any vision change, nausea, vomiting, one-sided numbness, weakness.   Home Medications Prior to Admission medications   Medication Sig Start Date End Date Taking? Authorizing Provider  amLODipine (NORVASC) 10 MG tablet TAKE ONE (1) TABLET BY MOUTH EVERY DAY 01/19/21   Lorretta Harp, MD  aspirin 325 MG tablet Take 325 mg by mouth daily.    [provider]  atorvastatin (LIPITOR) 80 MG tablet Take 1 tablet (80 mg total) by mouth at bedtime. 10/25/16   Rai, Vernelle Emerald, MD  cetirizine (ZYRTEC) 10 MG tablet Take 10 mg by mouth daily.    [provider]  clopidogrel (PLAVIX) 75 MG tablet Take 1 tablet (75 mg total) by mouth daily. 10/26/16   Rai, Ripudeep K, MD  Continuous Blood Gluc Transmit (DEXCOM G6 TRANSMITTER) MISC Use as directed for continuous glucose monitoring. Reuse transmitter for 90 days then discard and replace. 11/27/19   [provider]  Cyanocobalamin (VITAMIN B-12 PO) Take 5,000 mg by mouth daily.    [provider]  diclofenac sodium (VOLTAREN) 1 % GEL Apply 2 g topically as needed.  11/03/17   [provider]  diphenhydrAMINE (BENADRYL) 25 MG tablet Take 25 mg by mouth at bedtime as needed.    [provider]  Dulaglutide 1.5 MG/0.5ML SOPN Inject 1.5 mg into the skin once a week. On Tuesday    [provider]  ezetimibe (ZETIA) 10 MG tablet Take 1 tablet (10 mg total) by mouth daily. 12/30/20   Lorretta Harp,  MD  glimepiride (AMARYL) 4 MG tablet Take 4 mg by mouth 2 (two) times daily. 03/28/19   [provider]  HUMALOG KWIKPEN 100 UNIT/ML KwikPen Inject into the skin. 06/25/20   [provider]  Insulin Glargine (TOUJEO SOLOSTAR [place]) Inject 45-50 Units into the skin at bedtime.     [provider]  lisinopril (PRINIVIL,ZESTRIL) 20 MG tablet Take 20 mg by mouth daily. Take 1 tablet by mouth in the mornings 09/09/16   [provider]  Loratadine (CLARITIN PO) Take 1 tablet by mouth daily as needed.     [provider]  Magnesium 250 MG TABS Take 250 mg by mouth daily.    [provider]  metFORMIN (GLUCOPHAGE) 1000 MG tablet Take 1,000 mg by mouth 2 (two) times daily with a meal.    [provider]  metFORMIN (GLUCOPHAGE) 1000 MG tablet Take 1 tablet by mouth 2 (two) times daily with a meal. 12/01/20   [provider]  methocarbamol (ROBAXIN) 500 MG tablet Take 1 tablet by mouth 2 (two) times daily. 04/16/20   [provider]  metoprolol tartrate (LOPRESSOR) 25 MG tablet TAKE 1 TABLET IN THE MORNING AND 2 AT BEDTIME ALONG WITH 50MG  TABLET FOR TOTALOF 75MG  IN THE MORNING AND 100MG  AT BEDTIME 06/17/21   Almyra Deforest, PA  metoprolol tartrate (LOPRESSOR) 50 MG tablet Take 1 tablet (50  mg total) by mouth 2 (two) times daily. 12/30/20   Lorretta Harp, MD  Multiple Vitamin (MULTIVITAMIN PO) Take by mouth daily.    [provider]  Omega-3 Fatty Acids (FISH OIL) 1000 MG CAPS Take 1,000 mg by mouth daily.     [provider]  TOUJEO MAX SOLOSTAR 300 UNIT/ML Solostar Pen Inject into the skin. 03/18/20   [provider]  VITAMIN D PO Take 2,000 mg by mouth.     [provider]      Allergies    Influenza vaccines, Sitagliptin, Guaifenesin, Latex, Nitrofurantoin, Other, Phenylephrine-pheniramine-dm, Ultram [tramadol hcl], and Escitalopram oxalate    Review of Systems   Review of Systems  All other systems  reviewed and are negative.   Physical Exam Updated Vital Signs BP (!) 132/59   Pulse 68   Temp 98.7 F (37.1 C) (Oral)   Resp 18   Ht 5\' 3"  (1.6 m)   Wt 80 kg   SpO2 96%   BMI 31.24 kg/m  Physical Exam Vitals and nursing note reviewed.  Constitutional:      Appearance: She is well-developed.  HENT:     Head: Atraumatic.     Nose:     Comments: No deformity Eyes:     Extraocular Movements: Extraocular movements intact.     Pupils: Pupils are equal, round, and reactive to light.     Comments: No diplopia  Neck:     Comments: No midline c-spine tenderness, pt able to turn head to 45 degrees bilaterally without any pain and able to flex neck to the chest and extend without any pain or neurologic symptoms.  Cardiovascular:     Rate and Rhythm: Normal rate.  Pulmonary:     Effort: Pulmonary effort is normal.  Musculoskeletal:     Cervical back: Normal range of motion and neck supple.  Skin:    General: Skin is warm and dry.     Comments: Patient has ecchymosis and superficial abrasions of the face  Neurological:     Mental Status: She is alert and oriented to person, place, and time.     ED Results / Procedures / Treatments   Labs (all labs ordered are listed, but only abnormal results are displayed) Labs Reviewed  COMPREHENSIVE METABOLIC PANEL - Abnormal; Notable for the following components:      Result Value   Glucose, Bld 262 (*)    Albumin 3.1 (*)    All other components within normal limits  CBC - Abnormal; Notable for the following components:   Hemoglobin 11.7 (*)    All other components within normal limits  PROTIME-INR  SAMPLE TO BLOOD BANK    EKG None  Radiology CT HEAD WO CONTRAST  Result Date: 10/28/2021 CLINICAL DATA:  Trauma, fall EXAM: CT HEAD WITHOUT CONTRAST TECHNIQUE: Contiguous axial images were obtained from the base of the skull through the vertex without intravenous contrast. RADIATION DOSE REDUCTION: This exam was performed according  to the departmental dose-optimization program which includes automated exposure control, adjustment of the mA and/or kV according to patient size and/or use of iterative reconstruction technique. COMPARISON:  05/25/2020 FINDINGS: Brain: No acute intracranial findings are seen. There are no signs of bleeding within the cranium. There is large old infarct in the left posterior parietal cortex. Cortical sulci are prominent. Vascular: There are scattered arterial calcifications. Skull: No fracture is seen. There is subcutaneous contusion/hematoma in frontal scalp. Sinuses/Orbits: No acute findings are seen. There is decrease in  number of air cells in mastoids on both sides. Other: None. IMPRESSION: No acute intracranial findings are seen in noncontrast CT brain. Atrophy. 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 Electronically Signed   By: Elmer Picker M.D.   On: 10/28/2021 10:04   DG Chest Port 1 View  Result Date: 10/28/2021 CLINICAL DATA:  Trauma. EXAM: PORTABLE CHEST 1 VIEW COMPARISON:  August 26, 2021. FINDINGS: The heart size and mediastinal contours are within normal limits. Both lungs are clear. The visualized skeletal structures are unremarkable. IMPRESSION: No active disease. Electronically Signed   By: Marijo Conception M.D.   On: 10/28/2021 09:46    Procedures Procedures    Medications Ordered in ED Medications  acetaminophen (TYLENOL) tablet 500 mg (500 mg Oral Given 10/28/21 1101)    ED Course/ Medical Decision Making/ A&P                           Medical Decision Making Amount and/or Complexity of Data Reviewed Labs: ordered. Radiology: ordered.  Risk OTC drugs.   This patient presents to the ED with chief complaint(s) of fall with pertinent past medical history of Eliquis and full dose aspirin use for stroke prevention.patient is complaining of pain over her face/blunt trauma to the face.The complaint involves an extensive differential diagnosis and also carries with it a high risk  of complications and morbidity.    The differential diagnosis includes  - ICH - Fractures - Contusions - Soft tissue injury  The initial plan is to get CT scan of the brain, x-ray of the chest along with basic blood work only. Goal will be to rule out any intracranial bleed. However exam is not consistent with clinically significant facial fracture, therefore we will not get CT max face despite blunt trauma to the face.   Additional history obtained: Additional history obtained from EMS  Records reviewed  previous neurology visit, medications  Independent labs interpretation:  The following labs were independently interpreted: CBC, BMP which are reassuring  Independent visualization and interpretation of imaging: - I independently visualized the following imaging with scope of interpretation limited to determining acute life threatening conditions related to emergency care: CT scan of the brain, x-ray of the chest, which revealed no evidence of brain bleed or pneumothorax  Treatment and Reassessment: Results of the ER work-up discussed with the patient.  Tylenol given for pain control. She is stable for discharge at this time.   Final Clinical Impression(s) / ED Diagnoses Final diagnoses:  Fall, initial encounter  Facial abrasion, initial encounter    Rx / DC Orders ED Discharge Orders     None         Varney Biles, MD 10/28/21 1133

## 2021-10-28 NOTE — ED Triage Notes (Signed)
[place] [facility] [place] walking in and hit her face on the floor. Had not taken any of her morning medications yet, no LOC, patient [place] the [place] when EMS arrived on scene. Patient is alert, oriented, and in no apparent distress at this time. Complains of forehead pain and a headache that started after the fall and is progressively worsening.

## 2021-10-28 NOTE — Progress Notes (Signed)
   10/28/21 0909  Clinical Encounter Type  Visited With Health care provider  Visit Type ED;Trauma;Initial  Referral From Physician  Consult/Referral To Chaplain Melvenia Beam)  Recommendations Level 2 Trauma   Responded to page in E.D. Room 21 for Level 2 Trauma. [name] being evaluated and treated by medical staff at this time, patient not seen by Chaplain. No family present at this time. Staff will page Chaplain upon request of patient or family.  Chaplain Kyvon Hu, M.Min., [phone].

## 2021-10-28 NOTE — ED Notes (Signed)
Pt provided written and verbal d/c instructions. Pt and pt family verbalize understanding. Pt left dept. Via wheelchair to family POV.

## 2022-01-04 ENCOUNTER — Ambulatory Visit: Payer: Medicare Other | Attending: Cardiovascular Disease | Admitting: Cardiovascular Disease

## 2022-01-04 ENCOUNTER — Encounter: Payer: Self-pay | Admitting: Cardiovascular Disease

## 2022-01-04 VITALS — BP 128/68 | HR 60 | Ht 63.5 in | Wt 177.0 lb

## 2022-01-04 DIAGNOSIS — E782 Mixed hyperlipidemia: Secondary | ICD-10-CM | POA: Diagnosis not present

## 2022-01-04 DIAGNOSIS — I10 Essential (primary) hypertension: Secondary | ICD-10-CM | POA: Insufficient documentation

## 2022-01-04 DIAGNOSIS — R002 Palpitations: Secondary | ICD-10-CM | POA: Diagnosis not present

## 2022-01-04 NOTE — Progress Notes (Signed)
01/04/2022 Jill Howard   [birth year]  [id number]  Primary Physician Trenda Moots, PA-C Primary Cardiologist: Runell Gess MD Nicholes Calamity, [place]  HPI:  Jill Howard is a 81 y.o.  married Caucasian female mother of 2 children, grandmother of 4 grandchildren referred by Hayden Rasmussen, PA-C for cardiovascular valuation because of palpitations.  I last saw her in the office 12/30/2020. Her cardiac risk factors are notable for type 2 diabetes, essential hypertension, hyperlipidemia, fibromyalgia and vascular dementia.  She has had a stroke 10/24/2016 and suffers from migraine headaches as well.  She does not smoke.  She is never had a heart attack.  She denies chest pain.  She gets occasional shortness of breath when walking and has occasional lower extremity edema in her feet.  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  She apparently had an office visit with Hayden Rasmussen 03/12/2018 at which time she was complaining of upper respiratory tract symptoms and was treated with Augmentin.  She was also complaining of palpitations at that time.  AZO patch was placed from 03/19/2020 - 04/02/2018 which showed heart rates ranging from 55-1 42 averaging 83, sinus rhythm.  She did have short runs of SVT up to 5 beats with isolated supraventricular extra beats and rare ventricular extra beats.  There was no atrial fibrillation noted.  She does admit to drinking 4 cups of coffee a day.  She is not very symptomatic from these palpitations other than she notices them.   She saw Azalee Course , PA-C in the office 07/01/2018 who placed her on low-dose beta-blocker just resulted in complete resolution of her symptoms.  Blood work was drawn that revealed a normal TSH.  We talked about the importance of reducing her caffeine intake as well.    She did receive her second dose of the Moderna vaccine on May 10.  Since that time she is noticed increasing arthritic pain, some lower extremity edema,  and increased palpitations.  She also has noticed some increased arthritic symptoms which she attributes to her second Materna dose. She has decreased her caffeine intake from 4 cups to 2 cups of coffee a day   Since I saw her 12 months ago she continues to have arthritic type symptoms especially involving her hips.    She continues to take her beta-blocker which has improved her palpitations.  Her major complaint is of profound fatigue, although this seems to have improved over time.     Current Meds  Medication Sig   amLODipine (NORVASC) 10 MG tablet TAKE ONE (1) TABLET BY MOUTH EVERY DAY   aspirin 325 MG tablet Take 325 mg by mouth daily.   atorvastatin (LIPITOR) 80 MG tablet Take 1 tablet (80 mg total) by mouth at bedtime.   cetirizine (ZYRTEC) 10 MG tablet Take 10 mg by mouth daily.   clopidogrel (PLAVIX) 75 MG tablet Take 1 tablet (75 mg total) by mouth daily.   Continuous Blood Gluc Transmit (DEXCOM G6 TRANSMITTER) MISC Use as directed for continuous glucose monitoring. Reuse transmitter for 90 days then discard and replace.   Cyanocobalamin (VITAMIN B-12 PO) Take 5,000 mg by mouth daily.   diclofenac sodium (VOLTAREN) 1 % GEL Apply 2 g topically as needed.    diphenhydrAMINE (BENADRYL) 25 MG tablet Take 25 mg by mouth at bedtime as needed.   empagliflozin (JARDIANCE) 10 MG TABS tablet Take 10 mg by mouth daily.   ezetimibe (ZETIA) 10 MG tablet Take  1 tablet (10 mg total) by mouth daily.   glimepiride (AMARYL) 4 MG tablet Take 4 mg by mouth 2 (two) times daily.   HUMALOG KWIKPEN 100 UNIT/ML KwikPen Inject into the skin.   Insulin Glargine (TOUJEO SOLOSTAR [place]) Inject 45-50 Units into the skin at bedtime.    lisinopril (PRINIVIL,ZESTRIL) 20 MG tablet Take 20 mg by mouth daily. Take 1 tablet by mouth in the mornings   Loratadine (CLARITIN PO) Take 1 tablet by mouth daily as needed.    Magnesium 250 MG TABS Take 250 mg by mouth daily.   metFORMIN (GLUCOPHAGE) 1000 MG tablet Take 1,000 mg  by mouth 2 (two) times daily with a meal.   methocarbamol (ROBAXIN) 500 MG tablet Take 1 tablet by mouth 2 (two) times daily.   metoprolol tartrate (LOPRESSOR) 25 MG tablet TAKE 1 TABLET IN THE MORNING AND 2 AT BEDTIME ALONG WITH 50MG  TABLET FOR TOTALOF 75MG  IN THE MORNING AND 100MG  AT BEDTIME   metoprolol tartrate (LOPRESSOR) 50 MG tablet Take 1 tablet (50 mg total) by mouth 2 (two) times daily.   Multiple Vitamin (MULTIVITAMIN PO) Take by mouth daily.   Omega-3 Fatty Acids (FISH OIL) 1000 MG CAPS Take 1,000 mg by mouth daily.    TOUJEO MAX SOLOSTAR 300 UNIT/ML Solostar Pen Inject into the skin.   VITAMIN D PO Take 2,000 mg by mouth.    [DISCONTINUED] Dulaglutide 1.5 MG/0.5ML SOPN Inject 1.5 mg into the skin once a week. On Tuesday   [DISCONTINUED] metFORMIN (GLUCOPHAGE) 1000 MG tablet Take 1 tablet by mouth 2 (two) times daily with a meal.     Allergies  Allergen Reactions   Influenza Vaccines Swelling    Arm was swollen in 2002    Sitagliptin Swelling   Guaifenesin Other (See Comments)    Unknown   Latex Other (See Comments)    Unknown   Nitrofurantoin Nausea Only   Other Other (See Comments)    Band-Aid   Phenylephrine-Pheniramine-Dm Other (See Comments)    Unknown   Ultram [Tramadol Hcl] Other (See Comments)    hallucinations   Escitalopram Oxalate Other (See Comments)    Hallucinations    Social History   Socioeconomic History   Marital status: Married    Spouse name: Not on file   Number of children: Not on file   Years of education: Not on file   Highest education level: Not on file  Occupational History   Not on file  Tobacco Use   Smoking status: Former    Types: Cigarettes    Quit date: 70    Years since quitting: 40.9   Smokeless tobacco: Never  Vaping Use   Vaping Use: Never used  Substance and Sexual Activity   Alcohol use: No   Drug use: No   Sexual activity: Not Currently  Other Topics Concern   Not on file  Social History Narrative   Not on  file   Social Determinants of Health   Financial Resource Strain: Not on file  Food Insecurity: Not on file  Transportation Needs: Not on file  Physical Activity: Not on file  Stress: Not on file  Social Connections: Not on file  Intimate Partner Violence: Not on file     Review of Systems: General: negative for chills, fever, night sweats or weight changes.  Cardiovascular: negative for chest pain, dyspnea on exertion, edema, orthopnea, palpitations, paroxysmal nocturnal dyspnea or shortness of breath Dermatological: negative for rash Respiratory: negative for cough or wheezing Urologic:  negative for hematuria Abdominal: negative for nausea, vomiting, diarrhea, bright red blood per rectum, melena, or hematemesis Neurologic: negative for visual changes, syncope, or dizziness All other systems reviewed and are otherwise negative except as noted above.    Blood pressure 128/68, pulse 60, height 5' 3.5" (1.613 m), weight 177 lb (80.3 kg).  General appearance: alert and no distress Neck: no adenopathy, no carotid bruit, no JVD, supple, symmetrical, trachea midline, and thyroid not enlarged, symmetric, no tenderness/mass/nodules Lungs: clear to auscultation bilaterally Heart: regular rate and rhythm, S1, S2 normal, no murmur, click, rub or gallop Extremities: extremities normal, atraumatic, no cyanosis or edema Pulses: 2+ and symmetric Skin: Skin color, texture, turgor normal. No rashes or lesions Neurologic: Grossly normal  EKG not performed today  ASSESSMENT AND PLAN:   Essential hypertension History of essential hypertension blood pressure measured today at 128/68.  She is on amlodipine, metoprolol and lisinopril.  Hyperlipidemia History of hyperlipidemia on statin therapy as well as Zetia with lipid profile performed 12/24/2019 revealing total cholesterol 150, LDL of 83 and HDL 47.  Palpitations History of palpitations improved with reduction in caffeine intake and  low-dose beta-blocker.     Runell Gess MD FACP,FACC,FAHA, [facility] 01/04/2022 11:39 AM

## 2022-01-04 NOTE — Patient Instructions (Signed)
Medication Instructions:  Your physician recommends that you continue on your current medications as directed. Please refer to the Current Medication list given to you today.  *If you need a refill on your cardiac medications before your next appointment, please call your pharmacy*   Follow-Up: At [facility], you and your health needs are our priority.  As part of our continuing mission to provide you with exceptional heart care, we have created designated Provider Care Teams.  These Care Teams include your primary Cardiologist (physician) and Advanced Practice Providers (APPs -  Physician Assistants and Nurse Practitioners) who all work together to provide you with the care you need, when you need it.  We recommend signing up for the patient portal called "MyChart".  Sign up information is provided on this After Visit Summary.  MyChart is used to connect with patients for Virtual Visits (Telemedicine).  Patients are able to view lab/test results, encounter notes, upcoming appointments, etc.  Non-urgent messages can be sent to your provider as well.   To learn more about what you can do with MyChart, go to [email or web address].    Your next appointment:   12 month(s)  The format for your next appointment:   In Person  Provider:   Jonathan Berry, MD   

## 2022-01-04 NOTE — Assessment & Plan Note (Signed)
History of essential hypertension blood pressure measured today at 128/68.  She is on amlodipine, metoprolol and lisinopril.

## 2022-01-04 NOTE — Assessment & Plan Note (Signed)
History of palpitations improved with reduction in caffeine intake and low-dose beta-blocker.

## 2022-01-04 NOTE — Assessment & Plan Note (Signed)
History of hyperlipidemia on statin therapy as well as Zetia with lipid profile performed 12/24/2019 revealing total cholesterol 150, LDL of 83 and HDL 47.

## 2022-01-11 ENCOUNTER — Telehealth: Payer: Self-pay | Admitting: Cardiovascular Disease

## 2022-01-11 NOTE — Telephone Encounter (Signed)
Pt c/o medication issue:  1. Name of Medication: metoprolol tartrate (LOPRESSOR) 50 MG tablet   2. How are you currently taking this medication (dosage and times per day)?   3. Are you having a reaction (difficulty breathing--STAT)?   4. What is your medication issue? Pt states that [facility] has not received this medication refill and would like for it to be sent in.

## 2022-01-12 MED ORDER — METOPROLOL TARTRATE 25 MG PO TABS: ORAL_TABLET | ORAL | 3 refills | Status: DC

## 2022-01-18 ENCOUNTER — Telehealth: Payer: Self-pay | Admitting: Cardiovascular Disease

## 2022-01-18 MED ORDER — METOPROLOL TARTRATE 50 MG PO TABS: 50.0000 mg | ORAL_TABLET | Freq: Two times a day (BID) | ORAL | 3 refills | Status: DC

## 2022-01-18 NOTE — Telephone Encounter (Signed)
Pt c/o medication issue:  1. Name of Medication:   metoprolol tartrate (LOPRESSOR) 50 MG tablet    2. How are you currently taking this medication (dosage and times per day)?   3. Are you having a reaction (difficulty breathing--STAT)?   4. What is your medication issue? Patient is out of this medication and would like for this to be moved to   [facility] - [place], [place] - [address]

## 2022-01-22 ENCOUNTER — Other Ambulatory Visit: Payer: Self-pay | Admitting: Cardiovascular Disease

## 2022-03-02 ENCOUNTER — Institutional Professional Consult (permissible substitution): Payer: Medicare Other | Admitting: Neurology

## 2022-03-28 ENCOUNTER — Ambulatory Visit (INDEPENDENT_AMBULATORY_CARE_PROVIDER_SITE_OTHER): Payer: Medicare Other | Admitting: Neurology

## 2022-03-28 ENCOUNTER — Encounter: Payer: Self-pay | Admitting: Neurology

## 2022-03-28 VITALS — BP 146/74 | HR 62 | Ht 63.6 in | Wt 174.5 lb

## 2022-03-28 DIAGNOSIS — Z8673 Personal history of transient ischemic attack (TIA), and cerebral infarction without residual deficits: Secondary | ICD-10-CM | POA: Diagnosis not present

## 2022-03-28 DIAGNOSIS — R413 Other amnesia: Secondary | ICD-10-CM

## 2022-03-28 DIAGNOSIS — I6990 Unspecified sequelae of unspecified cerebrovascular disease: Secondary | ICD-10-CM

## 2022-03-28 DIAGNOSIS — R4189 Other symptoms and signs involving cognitive functions and awareness: Secondary | ICD-10-CM | POA: Diagnosis not present

## 2022-03-28 DIAGNOSIS — G3184 Mild cognitive impairment, so stated: Secondary | ICD-10-CM

## 2022-03-28 NOTE — Progress Notes (Signed)
[facility] [address] [place]. [place] [address] [phone]       OFFICE CONSULT NOTE  [name] Date of Birth:  05-12-1940 Medical Record Number:  [id number]   Referring MD: Jill Barters, PA-C  Reason for Referral: Memory difficulties and COVID brain fog  HPI: Jill Howard is a 81 year old pleasant Caucasian lady seen today for consultation for brain fog following COVID.  History is obtained from the patient and review of electronic medical records and care everywhere.  I have also reviewed pertinent available imaging films in PACS. She has past medical history of diabetes, hypertension, hyperlipidemia, mild obesity, kidney stones left MCA branch infarct and over 2018 with residual right partial peripheral vision loss and mild aphasia and word finding difficulties which she was seen in our office for follow-up with her last visit being in November 2019 with [facility] practitioner.  She states she got COVID twice in 2020 the left in December 2023.  Following this she has been feeling fatigued and tired and feels she has brain fog.  He has trouble remembering recent information.  At times she can remember people's first name but not her last name and vice versa.  She forgets what she is doing at times she also has trouble finding words.  She had mild cognitive symptoms following her stroke in 2018 but has improved.  He is also complaining of left lower extremity swelling for which primary physician no compression stockings.  Patient on Plavix which is tolerating well without bleeding or bruising.  She has had no recurrent stroke or TIA symptoms.  He feels the diabetes control has been poor blood sugars have been quite fluctuating.  She blames the COVID in December last year for this.  Blood pressure is much better usually in the 120`s at home but today it is elevated at 146/74 in our office..  She is tolerating Lipitor well without muscle aches and pains.  Last lab  work I have for review today on 12/14/2021 showed LDL cholesterol to be 73 mg percent and hemoglobin A1c was 8.6 on 11/30/2021.  Vitamin B12 level was greater than thousand on 12/14/2021 and TSH was 3.  On aspirin imaging available on 10/28/2021 showing old left parieto-occipital infarct with no acute abnormalities.  She denies any seizures or loss of consciousness.  Only minor tingling in the feet when she is tired but denies significant problems with walking or balance. ROS:   14 system review of systems is positive for memory difficulties, fatigue, tiredness, word finding difficulties, vision difficulties, bruising, numbness all other systems negative  PMH:  Past Medical History:  Diagnosis Date   Diabetes mellitus without complication ([place])    Gallstones    Hypertension    Kidney stone    Kidney stone    Stroke ([place]) 10/24/1996   Vertigo     Social History:  Social History   Socioeconomic History   Marital status: Married    Spouse name: Not on file   Number of children: Not on file   Years of education: Not on file   Highest education level: Not on file  Occupational History   Not on file  Tobacco Use   Smoking status: Former    Types: Cigarettes    Quit date: 80    Years since quitting: 41.2   Smokeless tobacco: Never  Vaping Use   Vaping Use: Never used  Substance and Sexual Activity   Alcohol use: No   Drug use: No  Sexual activity: Not Currently  Other Topics Concern   Not on file  Social History Narrative   Not on file   Social Determinants of Health   Financial Resource Strain: Not on file  Food Insecurity: Not on file  Transportation Needs: Not on file  Physical Activity: Not on file  Stress: Not on file  Social Connections: Not on file  Intimate Partner Violence: Not on file    Medications:   Current Outpatient Medications on File Prior to Visit  Medication Sig Dispense Refill   amLODipine (NORVASC) 10 MG tablet TAKE ONE (1) TABLET BY MOUTH  EVERY DAY 90 tablet 3   aspirin 325 MG tablet Take 325 mg by mouth daily.     atorvastatin (LIPITOR) 80 MG tablet Take 1 tablet (80 mg total) by mouth at bedtime. 30 tablet 3   cetirizine (ZYRTEC) 10 MG tablet Take 10 mg by mouth daily.     clopidogrel (PLAVIX) 75 MG tablet Take 1 tablet (75 mg total) by mouth daily. 30 tablet 4   Continuous Blood Gluc Transmit (DEXCOM G6 TRANSMITTER) MISC Use as directed for continuous glucose monitoring. Reuse transmitter for 90 days then discard and replace.     Cyanocobalamin (VITAMIN B-12 PO) Take 5,000 mg by mouth daily.     diclofenac sodium (VOLTAREN) 1 % GEL Apply 2 g topically as needed.      ezetimibe (ZETIA) 10 MG tablet Take 1 tablet (10 mg total) by mouth daily. 90 tablet 3   glimepiride (AMARYL) 4 MG tablet Take 4 mg by mouth 2 (two) times daily.     HUMALOG KWIKPEN 100 UNIT/ML KwikPen Inject into the skin.     Insulin Glargine (TOUJEO SOLOSTAR [place]) Inject 45-50 Units into the skin at bedtime.      lisinopril (PRINIVIL,ZESTRIL) 20 MG tablet Take 20 mg by mouth daily. Take 1 tablet by mouth in the mornings     Loratadine (CLARITIN PO) Take 1 tablet by mouth daily as needed.      Magnesium 250 MG TABS Take 250 mg by mouth daily.     metFORMIN (GLUCOPHAGE) 1000 MG tablet Take 1,000 mg by mouth 2 (two) times daily with a meal.     metoprolol tartrate (LOPRESSOR) 25 MG tablet TAKE 1 TABLET IN THE MORNING AND 2 AT BEDTIME ALONG WITH '50MG'$  TABLET FOR TOTALOF '75MG'$  IN THE MORNING AND '100MG'$  AT BEDTIME 180 tablet 3   metoprolol tartrate (LOPRESSOR) 50 MG tablet Take 1 tablet (50 mg total) by mouth 2 (two) times daily. 180 tablet 3   Multiple Vitamin (MULTIVITAMIN PO) Take by mouth daily.     Omega-3 Fatty Acids (FISH OIL) 1000 MG CAPS Take 1,000 mg by mouth daily.      TOUJEO MAX SOLOSTAR 300 UNIT/ML Solostar Pen Inject into the skin.     VITAMIN D PO Take 2,000 mg by mouth.      No current facility-administered medications on file prior to visit.     Allergies:   Allergies  Allergen Reactions   Influenza Vaccines Swelling    Arm was swollen in 2002    Sitagliptin Swelling   Guaifenesin Other (See Comments)    Unknown   Hydroxychloroquine Other (See Comments)    Eye pain   Latex Other (See Comments)    Unknown   Nitrofurantoin Nausea Only   Other Other (See Comments)    Band-Aid   Phenylephrine-Pheniramine-Dm Other (See Comments)    Unknown   Ultram [Tramadol Hcl] Other (See Comments)  hallucinations   Escitalopram Oxalate Other (See Comments)    Hallucinations    Physical Exam General: Mildly obese pleasant elderly Caucasian lady, seated, in no evident distress Head: head normocephalic and atraumatic.   Neck: supple with no carotid or supraclavicular bruits Cardiovascular: regular rate and rhythm, no murmurs Musculoskeletal: no deformity Skin:  no rash/petichiae Vascular:  Normal pulses all extremities  Neurologic Exam Mental Status: Awake and fully alert. Oriented to place and time. Recent and remote memory intact. Attention span, concentration and fund of knowledge appropriate. Mood and affect appropriate.  Diminished recall, able to name only 10 animals which can walk on 4 legs.  Mental status exam however scored 30/30.  Geriatric depression scale.3  not depressed.  Clock drawing 4/4. Cranial Nerves: Fundoscopic exam reveals sharp disc margins. Pupils equal, briskly reactive to light. Extraocular movements full without nystagmus. Visual fields show partial right homonymous hemianopsia to confrontation. Hearing intact. Facial sensation intact.  Mild right lower facial asymmetry tongue, palate moves normally and symmetrically.  Motor: Normal bulk and tone. Normal strength in all tested extremity muscles. Sensory.: intact to touch , pinprick , position and vibratory sensation.  Coordination: Rapid alternating movements normal in all extremities. Finger-to-nose and heel-to-shin performed accurately bilaterally. Gait  and Station: Arises from chair without difficulty. Stance is normal. Gait demonstrates normal stride length and balance .  Not able to heel, toe and tandem walk without difficulty.  Reflexes: 1+ and symmetric. Toes downgoing.   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  Remote history of left MCA infarct with intracranial stenosis in October 2018 with mild residual right-sided hemianopsia and word finding difficulties i.  Vascular risk factors of diabetes, hypertension, hyperlipidemia, mild obesity and intracranial stenosis     PLAN:I had a long discussion with the patient regarding her memory loss and cognitive difficulties following COVID December 2023 recommend further evaluation with checking MRI scan to rule out new stroke as well as EEG for seizures.  I encouraged her to increase participation in cognitively challenging activities like solving crossword puzzles and playing bridge and sudoku.  We also discussed memory compensation strategies.  Continue Plavix for stroke prevention with aggressive risk factor modification with strict control of hypertension with blood pressure goal below 130/90, lipids with LDL cholesterol goal below 70 mg percent and diabetes with hemoglobin A1c goal below 6.5%.  She will return for follow-up in the future in 3 to 4 months or call earlier if necessary.  Greater than 50% time during this 45-minute consultation visit was spent on counseling and  coordination of care about her memory loss and fatigue post-COVID and prior history of stroke and answering questions  Antony Contras, MD Note: This document was prepared with digital dictation and possible smart phrase technology. Any transcriptional errors that result from this process are unintentional.

## 2022-03-28 NOTE — Patient Instructions (Addendum)
I had a long discussion with the patient regarding her memory loss and cognitive difficulties following COVID December 2023 recommend further evaluation with checking MRI scan to rule out new stroke as well as EEG for seizures.  I encouraged her to increase participation in cognitively challenging activities like solving crossword puzzles and playing bridge and sudoku.  We also discussed memory compensation strategies.  Continue Plavix for stroke prevention with aggressive risk factor modification with strict control of hypertension with blood pressure goal below 130/90, lipids with LDL cholesterol goal below 70 mg percent and diabetes with hemoglobin A1c goal below 6.5%.  She will return for follow-up in the future in 3 to 4 months or call earlier if necessary.  Memory Compensation Strategies  Use "WARM" strategy.  W= write it down  A= associate it  R= repeat it  M= make a mental note  2.   You can keep a Social worker.  Use a 3-ring notebook with sections for the following: calendar, important names and phone numbers,  medications, doctors' names/phone numbers, lists/reminders, and a section to journal what you did  each day.   3.    Use a calendar to write appointments down.  4.    Write yourself a schedule for the day.  This can be placed on the calendar or in a separate section of the Memory Notebook.  Keeping a  regular schedule can help memory.  5.    Use medication organizer with sections for each day or morning/evening pills.  You may need help loading it  6.    Keep a basket, or pegboard by the door.  Place items that you need to take out with you in the basket or on the pegboard.  You may also want to  include a message board for reminders.  7.    Use sticky notes.  Place sticky notes with reminders in a place where the task is performed.  For example: " turn off the  stove" placed by the stove, "lock the door" placed on the door at eye level, " take your medications" on  the  bathroom mirror or by the place where you normally take your medications.  8.    Use alarms/timers.  Use while cooking to remind yourself to check on food or as a reminder to take your medicine, or as a  reminder to make a call, or as a reminder to perform another task, etc.

## 2022-03-29 ENCOUNTER — Telehealth: Payer: Self-pay | Admitting: Neurology

## 2022-03-29 NOTE — Telephone Encounter (Signed)
medicare/[employer] NPR sent to [facility] [phone]

## 2022-04-04 ENCOUNTER — Ambulatory Visit (INDEPENDENT_AMBULATORY_CARE_PROVIDER_SITE_OTHER): Payer: Medicare Other | Admitting: Neurology

## 2022-04-04 DIAGNOSIS — R4182 Altered mental status, unspecified: Secondary | ICD-10-CM

## 2022-04-04 DIAGNOSIS — R413 Other amnesia: Secondary | ICD-10-CM

## 2022-04-15 NOTE — Progress Notes (Signed)
Kindly inform the patient that EEG or brainwave study was normal.  No evidence of seizure activity.

## 2022-04-25 ENCOUNTER — Ambulatory Visit
Admission: RE | Admit: 2022-04-25 | Discharge: 2022-04-25 | Disposition: A | Discharge status: Home / Self Care | Payer: Medicare Other | Source: Ambulatory Visit | Attending: Neurology | Admitting: Neurology

## 2022-05-02 ENCOUNTER — Other Ambulatory Visit: Payer: Self-pay | Admitting: Neurology

## 2022-05-02 NOTE — Telephone Encounter (Signed)
Pt requesting medication to relax her during MRI.

## 2022-05-03 MED ORDER — ALPRAZOLAM 0.5 MG PO TABS: ORAL_TABLET | ORAL | 0 refills | Status: DC

## 2022-05-15 ENCOUNTER — Ambulatory Visit
Admission: RE | Admit: 2022-05-15 | Discharge: 2022-05-15 | Disposition: A | Discharge status: Home / Self Care | Payer: Medicare Other | Source: Ambulatory Visit | Attending: Neurology | Admitting: Neurology

## 2022-05-15 DIAGNOSIS — R413 Other amnesia: Secondary | ICD-10-CM

## 2022-05-15 MED ORDER — GADOPICLENOL 0.5 MMOL/ML IV SOLN
8.0000 mL | Freq: Once | INTRAVENOUS | Status: AC | PRN
  Administered 2022-05-15: 8 mL via INTRAVENOUS

## 2022-05-27 NOTE — Progress Notes (Signed)
Kindly inform the patient that MRI scan of the brain shows no new or worrisome findings.  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  No significant change compared with CT head from 10/28/2021

## 2022-05-30 ENCOUNTER — Telehealth: Payer: Self-pay

## 2022-05-30 NOTE — Progress Notes (Signed)
SB LVM #1

## 2022-05-30 NOTE — Telephone Encounter (Signed)
Called patient and no answer, so I left a message to call us back.

## 2022-05-30 NOTE — Telephone Encounter (Signed)
-----   Message from Katherine C Martin, RN sent at 05/30/2022  9:28 AM EDT -----  ----- Message ----- From: Sethi, Pramod S, MD Sent: 05/27/2022   8:03 AM EDT To: Gna-Pod 2 Results  Kindly inform the patient that MRI scan of the brain shows no new or worrisome findings.  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  No significant change compared with CT head from 10/28/2021  

## 2022-05-30 NOTE — Telephone Encounter (Signed)
Called patient and inform the patient that MRI scan of the brain shows no new or worrisome findings. No significant change compared with CT head from 10/28/2021. Pt verbalized understanding. Pt had no questions at this time but was encouraged to call back if questions arise.

## 2022-05-30 NOTE — Telephone Encounter (Signed)
-----   Message from Deatra James, RN sent at 05/30/2022  9:28 AM EDT -----  ----- Message ----- From: Micki Riley, MD Sent: 05/27/2022   8:03 AM EDT To: Gna-Pod 2 Results  Kindly inform the patient that MRI scan of the brain shows no new or worrisome findings.  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  No significant change compared with CT head from 10/28/2021

## 2022-06-02 ENCOUNTER — Other Ambulatory Visit: Payer: Self-pay | Admitting: Physician Assistant

## 2022-06-29 ENCOUNTER — Encounter (HOSPITAL_COMMUNITY): Payer: Self-pay

## 2022-06-29 ENCOUNTER — Observation Stay (HOSPITAL_COMMUNITY)
Admission: EM | Admit: 2022-06-29 | Discharge: 2022-07-02 | Disposition: A | Discharge status: Home / Self Care | Payer: Medicare Other | Attending: Family Medicine | Admitting: Family Medicine

## 2022-06-29 ENCOUNTER — Other Ambulatory Visit: Payer: Self-pay

## 2022-06-29 ENCOUNTER — Emergency Department (HOSPITAL_COMMUNITY): Payer: Medicare Other

## 2022-06-29 DIAGNOSIS — Z7902 Long term (current) use of antithrombotics/antiplatelets: Secondary | ICD-10-CM | POA: Diagnosis not present

## 2022-06-29 DIAGNOSIS — Z7984 Long term (current) use of oral hypoglycemic drugs: Secondary | ICD-10-CM | POA: Diagnosis not present

## 2022-06-29 DIAGNOSIS — R079 Chest pain, unspecified: Secondary | ICD-10-CM | POA: Diagnosis present

## 2022-06-29 DIAGNOSIS — I2584 Coronary atherosclerosis due to calcified coronary lesion: Secondary | ICD-10-CM | POA: Insufficient documentation

## 2022-06-29 DIAGNOSIS — I10 Essential (primary) hypertension: Secondary | ICD-10-CM | POA: Diagnosis not present

## 2022-06-29 DIAGNOSIS — D649 Anemia, unspecified: Secondary | ICD-10-CM | POA: Diagnosis not present

## 2022-06-29 DIAGNOSIS — Z87891 Personal history of nicotine dependence: Secondary | ICD-10-CM | POA: Insufficient documentation

## 2022-06-29 DIAGNOSIS — I679 Cerebrovascular disease, unspecified: Secondary | ICD-10-CM | POA: Diagnosis present

## 2022-06-29 DIAGNOSIS — R0602 Shortness of breath: Secondary | ICD-10-CM | POA: Insufficient documentation

## 2022-06-29 DIAGNOSIS — Z7982 Long term (current) use of aspirin: Secondary | ICD-10-CM | POA: Insufficient documentation

## 2022-06-29 DIAGNOSIS — E119 Type 2 diabetes mellitus without complications: Secondary | ICD-10-CM | POA: Insufficient documentation

## 2022-06-29 DIAGNOSIS — Z8673 Personal history of transient ischemic attack (TIA), and cerebral infarction without residual deficits: Secondary | ICD-10-CM | POA: Insufficient documentation

## 2022-06-29 DIAGNOSIS — F0390 Unspecified dementia without behavioral disturbance: Secondary | ICD-10-CM | POA: Diagnosis not present

## 2022-06-29 DIAGNOSIS — E872 Acidosis, unspecified: Secondary | ICD-10-CM | POA: Insufficient documentation

## 2022-06-29 DIAGNOSIS — Z8616 Personal history of COVID-19: Secondary | ICD-10-CM | POA: Insufficient documentation

## 2022-06-29 DIAGNOSIS — I2510 Atherosclerotic heart disease of native coronary artery without angina pectoris: Secondary | ICD-10-CM | POA: Diagnosis not present

## 2022-06-29 DIAGNOSIS — Z91040 Latex allergy status: Secondary | ICD-10-CM | POA: Insufficient documentation

## 2022-06-29 DIAGNOSIS — E118 Type 2 diabetes mellitus with unspecified complications: Secondary | ICD-10-CM | POA: Diagnosis present

## 2022-06-29 DIAGNOSIS — Z79899 Other long term (current) drug therapy: Secondary | ICD-10-CM | POA: Diagnosis not present

## 2022-06-29 DIAGNOSIS — E669 Obesity, unspecified: Secondary | ICD-10-CM | POA: Insufficient documentation

## 2022-06-29 DIAGNOSIS — M7989 Other specified soft tissue disorders: Secondary | ICD-10-CM | POA: Insufficient documentation

## 2022-06-29 DIAGNOSIS — E785 Hyperlipidemia, unspecified: Secondary | ICD-10-CM | POA: Diagnosis present

## 2022-06-29 LAB — BASIC METABOLIC PANEL
Anion gap: 12 (ref 5–15)
BUN: 15 mg/dL (ref 8–23)
CO2: 15 mmol/L — ABNORMAL LOW (ref 22–32)
Calcium: 7.8 mg/dL — ABNORMAL LOW (ref 8.9–10.3)
Chloride: 111 mmol/L (ref 98–111)
Creatinine, Ser: 0.64 mg/dL (ref 0.44–1.00)
GFR, Estimated: >60 mL/min (ref >60)
Glucose, Bld: 128 mg/dL — ABNORMAL HIGH (ref 70–99)
Potassium: 3.5 mmol/L (ref 3.5–5.1)
Sodium: 138 mmol/L (ref 135–145)

## 2022-06-29 LAB — CBC
HCT: 32.8 % — ABNORMAL LOW (ref 36.0–46.0)
Hemoglobin: 10.3 g/dL — ABNORMAL LOW (ref 12.0–15.0)
MCH: 28.9 pg (ref 26.0–34.0)
MCHC: 31.4 g/dL (ref 30.0–36.0)
MCV: 92.1 fL (ref 80.0–100.0)
Platelets: 165 10*3/uL (ref 150–400)
RBC: 3.56 MIL/uL — ABNORMAL LOW (ref 3.87–5.11)
RDW: 13.2 % (ref 11.5–15.5)
WBC: 6.8 10*3/uL (ref 4.0–10.5)
nRBC: 0 % (ref 0.0–0.2)

## 2022-06-29 LAB — TROPONIN I (HIGH SENSITIVITY)
Troponin I (High Sensitivity): 5 ng/L (ref <18)
Troponin I (High Sensitivity): 5 ng/L (ref <18)

## 2022-06-29 MED ORDER — ASPIRIN 81 MG PO CHEW
324.0000 mg | CHEWABLE_TABLET | Freq: Once | ORAL | Status: AC
  Administered 2022-06-29: 324 mg via ORAL
  Filled 2022-06-29: qty 4

## 2022-06-29 NOTE — ED Provider Triage Note (Signed)
Emergency Medicine Provider Triage Evaluation Note  Jill Howard , a 82 y.o. female  was evaluated in triage.  Pt complains of chest pain chest pain which was sharp substernal radiating to her esophagus she was walking across a parking lot.  No prior problems with her heart, they received some nitro sublingual which resolved her symptoms.  Not having any shortness of breath or other complaints.  Review of Systems  Positive: Chest pain, Negative: Sob, fever  Physical Exam  BP (!) 161/58 (BP Location: Right Arm)   Temp 98.2 F (36.8 C) (Oral)   Resp 18   SpO2 99%  Gen:   Awake, no distress   Resp:  Normal effort  MSK:   Moves extremities without difficulty  Other:    Medical Decision Making  Medically screening exam initiated at 4:47 PM.  Appropriate orders placed.  Jill Howard was informed that the remainder of the evaluation will be completed by another provider, this initial triage assessment does not replace that evaluation, and the importance of remaining in the ED until their evaluation is complete.     Claude Manges, PA-C 06/29/22 1653

## 2022-06-29 NOTE — ED Notes (Signed)
The pt arrived by ems  approx 6 hours ago from her doctors office  she has had some pitting edema in her lt leg and lt foot good pedal pulse  pulse in her lt foot  earlier today she had some chest pain  no pain at present just sl pressure sensation  for the past week she has had  sob with any exertion that is new

## 2022-06-29 NOTE — ED Triage Notes (Signed)
Pt BIB [facility] from home d/t exertional SOB & CP the last week. Also has abd distention & Lt leg swelling for the past year. Central CP is described as burning/aching & radiate to both jaw. 136/89, 63 bpm, 98% RA, CBG 178, 20g Lt hand PIV. Pain relief with 324 ASA & 1 Nitro, A/Ox4.

## 2022-06-29 NOTE — ED Provider Notes (Signed)
11:27 PM Assumed care from Dr. Criss Alvine, please see their note for full history, physical and decision making until this point. In brief this is a 82 y.o. year old female who presented to the ED tonight with Chest Pain     Exertional dyspnea and chest pain earlier with RF's. Trops flat. Pending d dimer, lipase and rest of labs. May need ct abdomen depending on if CTA is needed. Likely needs admit for CP rule out.   CT angio reassuring. Will admit for continued unstable angina/ischemic rule out. D/w [facility] for same.   Labs, studies and imaging reviewed by myself and considered in medical decision making if ordered. Imaging interpreted by radiology.  Labs Reviewed  BASIC METABOLIC PANEL - Abnormal; Notable for the following components:      Result Value   CO2 15 (*)    Glucose, Bld 128 (*)    Calcium 7.8 (*)    All other components within normal limits  CBC - Abnormal; Notable for the following components:   RBC 3.56 (*)    Hemoglobin 10.3 (*)    HCT 32.8 (*)    All other components within normal limits  HEPATIC FUNCTION PANEL  LIPASE, BLOOD  D-DIMER, QUANTITATIVE  BRAIN NATRIURETIC PEPTIDE  TROPONIN I (HIGH SENSITIVITY)  TROPONIN I (HIGH SENSITIVITY)    DG Chest 2 View  Final Result      No follow-ups on file.    Seraya Jobst, Barbara Cower, MD 07/01/22 [phone]

## 2022-06-29 NOTE — ED Provider Notes (Signed)
[name] EMERGENCY DEPARTMENT AT [facility] Provider Note   CSN: [id number] Arrival date & time: 06/29/22  1627     History  Chief Complaint  Patient presents with   Chest Pain    Jill Howard is a 82 y.o. female.  HPI 82 year old presents with shortness of breath and chest pain.  Symptoms started around 11 AM when she got out of a meeting.  Walked half a block and felt sudden shortness of breath and then shortly thereafter some sharp chest pain.  The chest pain radiated up to her bilateral neck. No dizziness, nausea or diaphoresis. Lasted for couple hours until she saw her PCP.  They called 911 and nitroglycerin was given by EMS.  Patient's pain then went away.  She is currently asymptomatic.  She has had some on and off very brief chest pain but nothing like what she had earlier.  She has a history of palpitations but no other significant cardiac disease.  She does have a history of hypertension, diabetes, hyperlipidemia.  She has chronic left greater than right leg swelling for over a year that seems to be slowly worse.  Symptoms did seem to be pleuritic today.  During the exam she seems to be tender in her abdomen.  I discussed this further with her and she has abdominal pain a lot and it took them 9 years to figure out that she was having some gastric ulcer issues.  She seems to have on and off swelling to her abdomen (a hernia?)  And today her abdomen seems swollen when EMS picked her up.  Home Medications Prior to Admission medications   Medication Sig Start Date End Date Taking? Authorizing Provider  ALPRAZolam Prudy Feeler) 0.5 MG tablet Take 1-2 tablets thirty minutes prior to MRI.  May take one additional tablet before entering scanner, if needed.  MUST HAVE DRIVER. 09/28/60   Micki Riley, MD  amLODipine (NORVASC) 10 MG tablet TAKE ONE (1) TABLET BY MOUTH EVERY DAY 01/25/22   Runell Gess, MD  aspirin 325 MG tablet Take 325 mg by mouth daily.    [provider]  atorvastatin (LIPITOR) 80 MG tablet Take 1 tablet (80 mg total) by mouth at bedtime. 10/25/16   Rai, Delene Ruffini, MD  cetirizine (ZYRTEC) 10 MG tablet Take 10 mg by mouth daily.    [provider]  clopidogrel (PLAVIX) 75 MG tablet Take 1 tablet (75 mg total) by mouth daily. 10/26/16   Rai, Ripudeep K, MD  Continuous Blood Gluc Transmit (DEXCOM G6 TRANSMITTER) MISC Use as directed for continuous glucose monitoring. Reuse transmitter for 90 days then discard and replace. 11/27/19   [provider]  Cyanocobalamin (VITAMIN B-12 PO) Take 5,000 mg by mouth daily.    [provider]  diclofenac sodium (VOLTAREN) 1 % GEL Apply 2 g topically as needed.  11/03/17   [provider]  ezetimibe (ZETIA) 10 MG tablet Take 1 tablet (10 mg total) by mouth daily. 12/30/20   Runell Gess, MD  glimepiride (AMARYL) 4 MG tablet Take 4 mg by mouth 2 (two) times daily. 03/28/19   [provider]  HUMALOG KWIKPEN 100 UNIT/ML KwikPen Inject into the skin. 06/25/20   [provider]  Insulin Glargine (TOUJEO SOLOSTAR [place]) Inject 45-50 Units into the skin at bedtime.     [provider]  lisinopril (PRINIVIL,ZESTRIL) 20 MG tablet Take 20 mg by mouth daily. Take 1 tablet by mouth in the mornings 09/09/16  [provider]  Loratadine (CLARITIN PO) Take 1 tablet by mouth daily as needed.     [provider]  Magnesium 250 MG TABS Take 250 mg by mouth daily.    [provider]  metFORMIN (GLUCOPHAGE) 1000 MG tablet Take 1,000 mg by mouth 2 (two) times daily with a meal.    [provider]  metoprolol tartrate (LOPRESSOR) 25 MG tablet TAKE 1 TABLET IN THE MORNING AND 2 AT BEDTIME ALONG WITH 50MG  TABLET FOR TOTALOF 75MG  IN THE MORNING AND 100MG  AT BEDTIME 06/02/22   Runell Gess, MD  metoprolol tartrate (LOPRESSOR) 50 MG tablet Take 1 tablet (50 mg total) by mouth 2 (two) times daily. 01/18/22   Runell Gess, MD  Multiple  Vitamin (MULTIVITAMIN PO) Take by mouth daily.    [provider]  Omega-3 Fatty Acids (FISH OIL) 1000 MG CAPS Take 1,000 mg by mouth daily.     [provider]  TOUJEO MAX SOLOSTAR 300 UNIT/ML Solostar Pen Inject into the skin. 03/18/20   [provider]  VITAMIN D PO Take 2,000 mg by mouth.     [provider]      Allergies    Influenza vaccines, Sitagliptin, Guaifenesin, Hydroxychloroquine, Latex, Nitrofurantoin, Other, Phenylephrine-pheniramine-dm, Ultram [tramadol hcl], and Escitalopram oxalate    Review of Systems   Review of Systems  Respiratory:  Positive for shortness of breath. Negative for cough.   Cardiovascular:  Positive for chest pain and leg swelling.  Gastrointestinal:  Positive for abdominal distention and abdominal pain.    Physical Exam Updated Vital Signs BP (!) 149/48   Pulse 63   Temp 98.5 F (36.9 C)   Resp (!) 21   SpO2 100%  Physical Exam Vitals and nursing note reviewed.  Constitutional:      Appearance: She is well-developed.  HENT:     Head: Normocephalic and atraumatic.  Cardiovascular:     Rate and Rhythm: Normal rate and regular rhythm.     Pulses:          Radial pulses are 2+ on the right side and 2+ on the left side.     Heart sounds: Normal heart sounds.  Pulmonary:     Effort: Pulmonary effort is normal.     Breath sounds: Normal breath sounds.  Abdominal:     Palpations: Abdomen is soft.     Tenderness: There is abdominal tenderness (mid upper abdomen. firm mass (hernia?)).  Musculoskeletal:     Left lower leg: Edema (pitting) present.  Skin:    General: Skin is warm and dry.  Neurological:     Mental Status: She is alert.     ED Results / Procedures / Treatments   Labs (all labs ordered are listed, but only abnormal results are displayed) Labs Reviewed  BASIC METABOLIC PANEL - Abnormal; Notable for the following components:      Result Value   CO2 15 (*)    Glucose, Bld 128 (*)     Calcium 7.8 (*)    All other components within normal limits  CBC - Abnormal; Notable for the following components:   RBC 3.56 (*)    Hemoglobin 10.3 (*)    HCT 32.8 (*)    All other components within normal limits  HEPATIC FUNCTION PANEL  LIPASE, BLOOD  D-DIMER, QUANTITATIVE  BRAIN NATRIURETIC PEPTIDE  TROPONIN I (HIGH SENSITIVITY)  TROPONIN I (HIGH SENSITIVITY)    EKG EKG Interpretation  Date/Time:  Wednesday June 29 2022  21:08:56 EDT Ventricular Rate:  62 PR Interval:  223 QRS Duration: 100 QT Interval:  447 QTC Calculation: 454 R Axis:   31 Text Interpretation: Sinus rhythm Prolonged PR interval no acute ST/T changes Confirmed by Pricilla Loveless [phone]) on 06/29/2022 9:11:17 PM  Radiology DG Chest 2 View  Result Date: 06/29/2022 CLINICAL DATA:  Chest pain EXAM: CHEST - 2 VIEW COMPARISON:  X-ray 10/28/2021 and older FINDINGS: No consolidation, pneumothorax or effusion. Normal cardiopericardial silhouette without edema. Mild degenerative changes of the spine with some curvature. Aortic calcifications. IMPRESSION: No acute cardiopulmonary disease. Electronically Signed   By: Karen Kays M.D.   On: 06/29/2022 17:38    Procedures Procedures    Medications Ordered in ED Medications  aspirin chewable tablet 324 mg (324 mg Oral Given 06/29/22 2118)    ED Course/ Medical Decision Making/ A&P                             Medical Decision Making Amount and/or Complexity of Data Reviewed Labs: ordered.    Details: Troponins negative. Mild acute on chronic anemia. Radiology: ordered and independent interpretation performed.    Details: No CHF ECG/medicine tests: ordered and independent interpretation performed.    Details: Nonspecific ST segments, improved on repeat  Risk OTC drugs.   Patient presents with exertional shortness of breath and chest pain.  Symptoms seem resolved though it seems like nitroglycerin made it go away.  Little atypical and the fact that it was  sharp and pleuritic.  Will assess for PE with D-dimer.  She seems to have left greater than right leg swelling but this is more chronic as well.  She also has abdominal pain, probably from a hernia but given this she will also need a CT. Care transferred to Dr. Clayborne Dana.        Final Clinical Impression(s) / ED Diagnoses Final diagnoses:  None    Rx / DC Orders ED Discharge Orders     None         Pricilla Loveless, MD 06/29/22 2338

## 2022-06-30 ENCOUNTER — Observation Stay (HOSPITAL_BASED_OUTPATIENT_CLINIC_OR_DEPARTMENT_OTHER): Payer: Medicare Other

## 2022-06-30 ENCOUNTER — Emergency Department (HOSPITAL_COMMUNITY): Payer: Medicare Other

## 2022-06-30 ENCOUNTER — Encounter (HOSPITAL_COMMUNITY)
Admission: EM | Disposition: A | Discharge status: Home / Self Care | Payer: Self-pay | Source: Home / Self Care | Attending: Emergency Medicine

## 2022-06-30 DIAGNOSIS — I10 Essential (primary) hypertension: Secondary | ICD-10-CM

## 2022-06-30 DIAGNOSIS — R079 Chest pain, unspecified: Secondary | ICD-10-CM | POA: Diagnosis not present

## 2022-06-30 DIAGNOSIS — E118 Type 2 diabetes mellitus with unspecified complications: Secondary | ICD-10-CM | POA: Diagnosis not present

## 2022-06-30 DIAGNOSIS — Z8673 Personal history of transient ischemic attack (TIA), and cerebral infarction without residual deficits: Secondary | ICD-10-CM

## 2022-06-30 DIAGNOSIS — D649 Anemia, unspecified: Secondary | ICD-10-CM

## 2022-06-30 DIAGNOSIS — I679 Cerebrovascular disease, unspecified: Secondary | ICD-10-CM | POA: Diagnosis not present

## 2022-06-30 DIAGNOSIS — I25110 Atherosclerotic heart disease of native coronary artery with unstable angina pectoris: Secondary | ICD-10-CM

## 2022-06-30 DIAGNOSIS — M7989 Other specified soft tissue disorders: Secondary | ICD-10-CM | POA: Diagnosis not present

## 2022-06-30 DIAGNOSIS — E872 Acidosis, unspecified: Secondary | ICD-10-CM

## 2022-06-30 DIAGNOSIS — E7800 Pure hypercholesterolemia, unspecified: Secondary | ICD-10-CM

## 2022-06-30 DIAGNOSIS — E669 Obesity, unspecified: Secondary | ICD-10-CM

## 2022-06-30 DIAGNOSIS — E782 Mixed hyperlipidemia: Secondary | ICD-10-CM

## 2022-06-30 DIAGNOSIS — I2510 Atherosclerotic heart disease of native coronary artery without angina pectoris: Secondary | ICD-10-CM | POA: Diagnosis not present

## 2022-06-30 HISTORY — PX: LEFT HEART CATH AND CORONARY ANGIOGRAPHY: CATH118249

## 2022-06-30 LAB — ECHOCARDIOGRAM COMPLETE
Area-P 1/2: 4 cm2
S' Lateral: 3.2 cm
Weight: 2832.47 oz

## 2022-06-30 LAB — HEPATIC FUNCTION PANEL
ALT: 19 U/L (ref 0–44)
AST: 17 U/L (ref 15–41)
Albumin: 3 g/dL — ABNORMAL LOW (ref 3.5–5.0)
Alkaline Phosphatase: 59 U/L (ref 38–126)
Bilirubin, Direct: 0.1 mg/dL (ref 0.0–0.2)
Indirect Bilirubin: 0.6 mg/dL (ref 0.3–0.9)
Total Bilirubin: 0.7 mg/dL (ref 0.3–1.2)
Total Protein: 6.4 g/dL — ABNORMAL LOW (ref 6.5–8.1)

## 2022-06-30 LAB — GLUCOSE, CAPILLARY
Glucose-Capillary: 102 mg/dL — ABNORMAL HIGH (ref 70–99)
Glucose-Capillary: 113 mg/dL — ABNORMAL HIGH (ref 70–99)
Glucose-Capillary: 134 mg/dL — ABNORMAL HIGH (ref 70–99)
Glucose-Capillary: 155 mg/dL — ABNORMAL HIGH (ref 70–99)
Glucose-Capillary: 88 mg/dL (ref 70–99)

## 2022-06-30 LAB — HEMOGLOBIN A1C
Hgb A1c MFr Bld: 8.7 % — ABNORMAL HIGH (ref 4.8–5.6)
Mean Plasma Glucose: 202.99 mg/dL

## 2022-06-30 LAB — D-DIMER, QUANTITATIVE: D-Dimer, Quant: 0.94 ug/mL-FEU — ABNORMAL HIGH (ref 0.00–0.50)

## 2022-06-30 LAB — LIPASE, BLOOD: Lipase: 33 U/L (ref 11–51)

## 2022-06-30 LAB — BRAIN NATRIURETIC PEPTIDE: B Natriuretic Peptide: 99.9 pg/mL (ref 0.0–100.0)

## 2022-06-30 SURGERY — LEFT HEART CATH AND CORONARY ANGIOGRAPHY
Anesthesia: LOCAL

## 2022-06-30 MED ORDER — EZETIMIBE 10 MG PO TABS
10.0000 mg | ORAL_TABLET | Freq: Every day | ORAL | Status: DC
  Administered 2022-06-30 – 2022-07-02 (×3): 10 mg via ORAL
  Filled 2022-06-30 (×3): qty 1

## 2022-06-30 MED ORDER — ATORVASTATIN CALCIUM 80 MG PO TABS
80.0000 mg | ORAL_TABLET | Freq: Every day | ORAL | Status: DC
  Administered 2022-06-30 – 2022-07-01 (×2): 80 mg via ORAL
  Filled 2022-06-30 (×2): qty 1

## 2022-06-30 MED ORDER — ONDANSETRON HCL 4 MG/2ML IJ SOLN: 4.0000 mg | Freq: Four times a day (QID) | INTRAMUSCULAR | Status: DC | PRN

## 2022-06-30 MED ORDER — MIDAZOLAM HCL 2 MG/2ML IJ SOLN
INTRAMUSCULAR | Status: AC
  Filled 2022-06-30: qty 2

## 2022-06-30 MED ORDER — VERAPAMIL HCL 2.5 MG/ML IV SOLN
INTRAVENOUS | Status: DC | PRN
  Administered 2022-06-30: 10 mL via INTRA_ARTERIAL

## 2022-06-30 MED ORDER — METOPROLOL SUCCINATE ER 50 MG PO TB24: 75.0000 mg | ORAL_TABLET | Freq: Every day | ORAL | Status: DC

## 2022-06-30 MED ORDER — INSULIN ASPART 100 UNIT/ML IJ SOLN: 0.0000 [IU] | INTRAMUSCULAR | Status: DC

## 2022-06-30 MED ORDER — SODIUM CHLORIDE 0.9% FLUSH
3.0000 mL | Freq: Two times a day (BID) | INTRAVENOUS | Status: DC
  Administered 2022-07-01 (×2): 3 mL via INTRAVENOUS

## 2022-06-30 MED ORDER — HEPARIN SODIUM (PORCINE) 1000 UNIT/ML IJ SOLN
INTRAMUSCULAR | Status: AC
  Filled 2022-06-30: qty 10

## 2022-06-30 MED ORDER — MIDAZOLAM HCL 2 MG/2ML IJ SOLN
INTRAMUSCULAR | Status: DC | PRN
  Administered 2022-06-30: 1 mg via INTRAVENOUS

## 2022-06-30 MED ORDER — HYDRALAZINE HCL 20 MG/ML IJ SOLN: 10.0000 mg | INTRAMUSCULAR | Status: AC | PRN

## 2022-06-30 MED ORDER — SODIUM CHLORIDE 0.9 % WEIGHT BASED INFUSION
3.0000 mL/kg/h | INTRAVENOUS | Status: DC
  Administered 2022-06-30: 3 mL/kg/h via INTRAVENOUS

## 2022-06-30 MED ORDER — SODIUM CHLORIDE 0.9% FLUSH: 3.0000 mL | INTRAVENOUS | Status: DC | PRN

## 2022-06-30 MED ORDER — HEPARIN (PORCINE) IN NACL 1000-0.9 UT/500ML-% IV SOLN
INTRAVENOUS | Status: DC | PRN
  Administered 2022-06-30 (×2): 500 mL

## 2022-06-30 MED ORDER — ENOXAPARIN SODIUM 40 MG/0.4ML IJ SOSY
40.0000 mg | PREFILLED_SYRINGE | INTRAMUSCULAR | Status: DC
  Administered 2022-06-30 – 2022-07-01 (×2): 40 mg via SUBCUTANEOUS
  Filled 2022-06-30 (×2): qty 0.4

## 2022-06-30 MED ORDER — INSULIN ASPART 100 UNIT/ML IJ SOLN
0.0000 [IU] | Freq: Every day | INTRAMUSCULAR | Status: DC
  Administered 2022-07-01: 3 [IU] via SUBCUTANEOUS

## 2022-06-30 MED ORDER — CLOPIDOGREL BISULFATE 75 MG PO TABS
75.0000 mg | ORAL_TABLET | Freq: Every day | ORAL | Status: DC
  Administered 2022-06-30 – 2022-07-02 (×3): 75 mg via ORAL
  Filled 2022-06-30 (×3): qty 1

## 2022-06-30 MED ORDER — ACETAMINOPHEN 325 MG PO TABS: 650.0000 mg | ORAL_TABLET | Freq: Four times a day (QID) | ORAL | Status: DC | PRN

## 2022-06-30 MED ORDER — FENTANYL CITRATE (PF) 100 MCG/2ML IJ SOLN
INTRAMUSCULAR | Status: AC
  Filled 2022-06-30: qty 2

## 2022-06-30 MED ORDER — INSULIN GLARGINE-YFGN 100 UNIT/ML ~~LOC~~ SOLN
15.0000 [IU] | Freq: Every day | SUBCUTANEOUS | Status: DC
  Administered 2022-06-30: 15 [IU] via SUBCUTANEOUS
  Filled 2022-06-30 (×3): qty 0.15

## 2022-06-30 MED ORDER — ASPIRIN 81 MG PO TBEC
81.0000 mg | DELAYED_RELEASE_TABLET | Freq: Every day | ORAL | Status: DC
  Administered 2022-06-30 – 2022-07-02 (×3): 81 mg via ORAL
  Filled 2022-06-30 (×3): qty 1

## 2022-06-30 MED ORDER — LABETALOL HCL 5 MG/ML IV SOLN: 10.0000 mg | INTRAVENOUS | Status: AC | PRN

## 2022-06-30 MED ORDER — ISOSORBIDE MONONITRATE ER 30 MG PO TB24
30.0000 mg | ORAL_TABLET | Freq: Every day | ORAL | Status: DC
  Administered 2022-06-30 – 2022-07-02 (×3): 30 mg via ORAL
  Filled 2022-06-30 (×3): qty 1

## 2022-06-30 MED ORDER — ACETAMINOPHEN 650 MG RE SUPP: 650.0000 mg | Freq: Four times a day (QID) | RECTAL | Status: DC | PRN

## 2022-06-30 MED ORDER — FENTANYL CITRATE (PF) 100 MCG/2ML IJ SOLN
INTRAMUSCULAR | Status: DC | PRN
  Administered 2022-06-30: 25 ug via INTRAVENOUS

## 2022-06-30 MED ORDER — METOPROLOL TARTRATE 50 MG PO TABS
50.0000 mg | ORAL_TABLET | Freq: Two times a day (BID) | ORAL | Status: DC
  Administered 2022-06-30: 50 mg via ORAL
  Filled 2022-06-30: qty 1

## 2022-06-30 MED ORDER — HEPARIN SODIUM (PORCINE) 1000 UNIT/ML IJ SOLN
INTRAMUSCULAR | Status: DC | PRN
  Administered 2022-06-30: 4000 [IU] via INTRAVENOUS

## 2022-06-30 MED ORDER — INSULIN ASPART 100 UNIT/ML IJ SOLN
0.0000 [IU] | Freq: Three times a day (TID) | INTRAMUSCULAR | Status: DC
  Administered 2022-07-01: 8 [IU] via SUBCUTANEOUS
  Administered 2022-07-01: 11 [IU] via SUBCUTANEOUS
  Administered 2022-07-01: 3 [IU] via SUBCUTANEOUS
  Administered 2022-07-02: 5 [IU] via SUBCUTANEOUS

## 2022-06-30 MED ORDER — VERAPAMIL HCL 2.5 MG/ML IV SOLN
INTRAVENOUS | Status: AC
  Filled 2022-06-30: qty 2

## 2022-06-30 MED ORDER — LIDOCAINE HCL (PF) 1 % IJ SOLN
INTRAMUSCULAR | Status: DC | PRN
  Administered 2022-06-30: 2 mL

## 2022-06-30 MED ORDER — IOHEXOL 350 MG/ML SOLN
INTRAVENOUS | Status: DC | PRN
  Administered 2022-06-30: 50 mL

## 2022-06-30 MED ORDER — SODIUM CHLORIDE 0.9 % IV SOLN: INTRAVENOUS | Status: AC

## 2022-06-30 MED ORDER — IOHEXOL 350 MG/ML SOLN
75.0000 mL | Freq: Once | INTRAVENOUS | Status: AC | PRN
  Administered 2022-06-30: 75 mL via INTRAVENOUS

## 2022-06-30 MED ORDER — METOPROLOL TARTRATE 50 MG PO TABS
50.0000 mg | ORAL_TABLET | Freq: Two times a day (BID) | ORAL | Status: AC
  Administered 2022-06-30: 50 mg via ORAL
  Filled 2022-06-30: qty 1

## 2022-06-30 MED ORDER — SODIUM CHLORIDE 0.9 % WEIGHT BASED INFUSION: 1.0000 mL/kg/h | INTRAVENOUS | Status: DC

## 2022-06-30 MED ORDER — LIDOCAINE HCL (PF) 1 % IJ SOLN
INTRAMUSCULAR | Status: AC
  Filled 2022-06-30: qty 30

## 2022-06-30 MED ORDER — SODIUM CHLORIDE 0.9 % IV SOLN: 250.0000 mL | INTRAVENOUS | Status: DC | PRN

## 2022-06-30 MED ORDER — AMLODIPINE BESYLATE 10 MG PO TABS
10.0000 mg | ORAL_TABLET | Freq: Every day | ORAL | Status: DC
  Administered 2022-06-30 – 2022-07-02 (×3): 10 mg via ORAL
  Filled 2022-06-30 (×3): qty 1

## 2022-06-30 SURGICAL SUPPLY — 11 items
BAND CMPR LRG ZPHR (HEMOSTASIS) ×1
BAND ZEPHYR COMPRESS 30 LONG (HEMOSTASIS) IMPLANT
CATH 5FR JL3.5 JR4 ANG PIG MP (CATHETERS) IMPLANT
GLIDESHEATH SLEND SS 6F .021 (SHEATH) IMPLANT
GUIDEWIRE INQWIRE 1.5J.035X260 (WIRE) IMPLANT
INQWIRE 1.5J .035X260CM (WIRE) ×1
KIT HEART LEFT (KITS) ×1 IMPLANT
PACK CARDIAC CATHETERIZATION (CUSTOM PROCEDURE TRAY) ×1 IMPLANT
SYR MEDRAD MARK 7 150ML (SYRINGE) ×1 IMPLANT
TRANSDUCER W/STOPCOCK (MISCELLANEOUS) ×1 IMPLANT
TUBING CIL FLEX 10 FLL-RA (TUBING) ×1 IMPLANT

## 2022-06-30 NOTE — Care Plan (Addendum)
During handoff, night shift Joni Reining RN explained that she tried to get [facility] to come see patient or give orders via both paging, secure chatting, and calling since 5:30AM, the time the patient was transferred from the ED to the PCU. I chatted with the patient this morning and she stated she has not seen any physician or hospitalist since time of admission, ~3pm yesterday. Joni Reining, last night's RN whom received the patient after 5:30AM this morning has stated that in her report she was given only one physician has seen her for less than 5 minutes at time of new admission in the ED yesterday at ~3PM. I have called Briscoe Deutscher, MD since he was the admitting doctor and the call went to voicemail. [facility] admissions team called RN's phone at 732AM after RN paged them at 715AM and admissions person stated "sorry, it's been a busy night." When asked for clarification, she stated she just changed the hospitalist and for any urgent issues to page the new one. Patient is awaiting admissions orders. Currently only orders since time of admission includes a consult for unassigned medical admission by [facility], aspirin ordered last night at 2115, a contrast injection Iohexol ordered, and cardiac monitoring for ischemia. I have secure chatted and paged the new physician Alberteen Sam MD at [phone].    RN was in the room during Dr. Cristal Deer P. Danford's assessment at ~0920AM. Orders put in at 1027AM

## 2022-06-30 NOTE — Progress Notes (Signed)
Lower extremity venous left study completed.   Please see CV Proc for preliminary results.   Damaria Vachon, RDMS, RVT  

## 2022-06-30 NOTE — H&P (Signed)
History and Physical    Patient: Jill Howard ZOX:[id number] DOB: 21-Dec-1940 DOA: 06/29/2022 DOS: the patient was seen and examined on 06/30/2022 PCP: Trenda Moots, PA-C  Patient coming from: Home  Chief Complaint:  Chief Complaint  Patient presents with   Chest Pain       HPI:  Jill Howard is a 82 y.o. 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  She describes this to me as substernal pain worse with lifting things or walking places, associated with shortness of breath, and progressing in the last few days to radiate to the jaw.   It is also associated with "fast heart rate" (see below in assessment and plan).     In the ER, trops normal, ECG showed mild nonspecific abnormality, unchanged from prior.  CTA chest showed no PE but heavy calcification of coronary arteries.  Given aspirin and Cardiology consulted for risk stratification.      Review of Systems  Constitutional:  Negative for chills, fever and malaise/fatigue.  Respiratory:  Positive for shortness of breath. Negative for cough.   Cardiovascular:  Positive for chest pain, palpitations and leg swelling (worse than baseline on left). Negative for orthopnea, claudication and PND.  All other systems reviewed and are negative.    Past Medical History:  Diagnosis Date   Diabetes mellitus without complication (HCC)    Gallstones    Hypertension    Kidney stone    Kidney stone    Stroke [facility]) 10/24/1996   Vertigo    Past Surgical History:  Procedure Laterality Date   ABDOMINAL HYSTERECTOMY     KIDNEY STONE SURGERY     Social History:  reports that she quit smoking about 41 years ago. Her smoking use included cigarettes. She has never used smokeless tobacco. She reports that she does not drink alcohol and does not use drugs.  Allergies   Allergen Reactions   Influenza Vaccines Swelling    Arm was swollen in 2002    Sitagliptin Swelling   Guaifenesin Other (See Comments)    Unknown   Hydroxychloroquine Other (See Comments)    Eye pain   Latex Other (See Comments)    Unknown   Nitrofurantoin Nausea Only   Other Other (See Comments)    Band-Aid   Phenylephrine-Pheniramine-Dm Other (See Comments)    Unknown   Ultram [Tramadol Hcl] Other (See Comments)    hallucinations   Escitalopram Oxalate Other (See Comments)    Hallucinations    Family History  Problem Relation Age of Onset   Stroke Other    Heart attack Other    Cancer Other    Diabetes Other    Stroke Mother    Heart attack Mother    Stroke Maternal Aunt    Stroke Paternal Aunt    Rheum arthritis Paternal Aunt    Stroke Brother    Heart attack Brother    COPD Brother    Rheum arthritis Paternal Aunt    Diabetes Son    Heart disease Son    Diabetes Daughter     Prior to Admission medications   Medication Sig Start Date End Date Taking? Authorizing Provider  ALPRAZolam Prudy Feeler) 0.5 MG tablet Take 1-2 tablets thirty minutes prior to MRI.  May take one additional tablet before entering scanner, if needed.  MUST HAVE DRIVER. 08/24/17  Micki Riley, MD  amLODipine (NORVASC) 10 MG tablet TAKE ONE (1) TABLET BY MOUTH EVERY DAY 01/25/22   Runell Gess, MD  aspirin 325 MG tablet Take 325 mg by mouth daily.    [provider]  atorvastatin (LIPITOR) 80 MG tablet Take 1 tablet (80 mg total) by mouth at bedtime. 10/25/16   Rai, Delene Ruffini, MD  cetirizine (ZYRTEC) 10 MG tablet Take 10 mg by mouth daily.    [provider]  clopidogrel (PLAVIX) 75 MG tablet Take 1 tablet (75 mg total) by mouth daily. 10/26/16   Rai, Ripudeep K, MD  Continuous Blood Gluc Transmit (DEXCOM G6 TRANSMITTER) MISC Use as directed for continuous glucose monitoring. Reuse transmitter for 90 days then discard and replace. 11/27/19   [provider]   Cyanocobalamin (VITAMIN B-12 PO) Take 5,000 mg by mouth daily.    [provider]  diclofenac sodium (VOLTAREN) 1 % GEL Apply 2 g topically as needed.  11/03/17   [provider]  ezetimibe (ZETIA) 10 MG tablet Take 1 tablet (10 mg total) by mouth daily. 12/30/20   Runell Gess, MD  glimepiride (AMARYL) 4 MG tablet Take 4 mg by mouth 2 (two) times daily. 03/28/19   [provider]  HUMALOG KWIKPEN 100 UNIT/ML KwikPen Inject into the skin. 06/25/20   [provider]  Insulin Glargine (TOUJEO SOLOSTAR [place]) Inject 45-50 Units into the skin at bedtime.     [provider]  lisinopril (PRINIVIL,ZESTRIL) 20 MG tablet Take 20 mg by mouth daily. Take 1 tablet by mouth in the mornings 09/09/16   [provider]  Loratadine (CLARITIN PO) Take 1 tablet by mouth daily as needed.     [provider]  Magnesium 250 MG TABS Take 250 mg by mouth daily.    [provider]  metFORMIN (GLUCOPHAGE) 1000 MG tablet Take 1,000 mg by mouth 2 (two) times daily with a meal.    [provider]  metoprolol tartrate (LOPRESSOR) 25 MG tablet TAKE 1 TABLET IN THE MORNING AND 2 AT BEDTIME ALONG WITH 50MG  TABLET FOR TOTALOF 75MG  IN THE MORNING AND 100MG  AT BEDTIME 06/02/22   Runell Gess, MD  metoprolol tartrate (LOPRESSOR) 50 MG tablet Take 1 tablet (50 mg total) by mouth 2 (two) times daily. 01/18/22   Runell Gess, MD  Multiple Vitamin (MULTIVITAMIN PO) Take by mouth daily.    [provider]  Omega-3 Fatty Acids (FISH OIL) 1000 MG CAPS Take 1,000 mg by mouth daily.     [provider]  TOUJEO MAX SOLOSTAR 300 UNIT/ML Solostar Pen Inject into the skin. 03/18/20   [provider]  VITAMIN D PO Take 2,000 mg by mouth.     [provider]    Physical Exam: Vitals:   06/30/22 0345 06/30/22 0649 06/30/22 0948 06/30/22 1154  BP: (!) 145/39 (!) 154/57 (!) 155/62 137/61  Pulse: 63 64 70 68  Resp: 18 18 17 18    Temp:  98.4 F (36.9 C) 98.1 F (36.7 C) 98 F (36.7 C)  TempSrc:  Oral Oral Oral  SpO2: 95% 100% 96% 91%   Elderly adult female, sitting on the edge of the bed, interactive and appropriate, dress and hygiene appear normal Anicteric, conjunctival pink, lids and lashes normal.  No nasal deformity, discharge, or epistaxis, oropharynx moist, no oral lesions Heart rate normal, no murmurs, no JVD.  She does have 1+ pitting edema in the left leg, no pitting on  the right, no pain to palpation of the precordium Respiratory rate normal, lungs clear without rales or wheezes Abdomen soft without tenderness palpation or guarding, no ascites Attention normal, affect appropriate, judgment insight appear normal Face symmetric, speech fluent, moves upper extremities with normal strength and coordination     Data Reviewed: Discussed with cardiology team Basic metabolic panel shows mild nongap acidosis, normal renal function LFTs normal CBC shows mild anemia, hemoglobin 10.3, slightly lower than baseline 12 HEENT normal 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  ACS has been ruled out. - Consult cardiology for risk stratification   Acute on chronic left leg swelling She reports she has chronic left leg swelling, is worse right now than usual - Doppler ultrasound ordered  History of SVT Zio patch in 2020 showed extremely brief episodes of SVT.  She is on metoprolol for this and follows with Dr. Marina Goodell.  Her description of her palpitations and "fast heart rate", seem much longer lasting than very brief episodes  described under Zio patch, and I doubt they are correlated. - Hold metoprolol until evaluated by Cardiology and risk stratifcation plans are set   Abnormal radiography of the liver, possible cirrhosis She has normal synthetic function of the liver on LFTs, but radiographic appearance of cirrhosis - Follow-up with PCP  Hypertension Blood pressure slightly elevated - Resume home amlodipine - Hold metoprolol and lisinopril  Type 2 diabetes Glucose normal - She is n.p.o. so we will reduce her Lantus for now - Sliding scale corrections  Obesity BMI 31  Hyperlipidemia Cerebrovascular disease - Continue aspirin, atorvastatin, Zetia, Plavix  Normocytic anemia Old iron studies show some evidence of iron deficiency for which she is being treated by her PCP.  Hemoglobin is slightly lower than baseline, but there is no clinical bleeding - Follow-up with PCP, repeat CBC in 2 to 4 weeks  Non-anion gap metabolic acidosis Asymptomatic, clinical significance dubious, no reported vomiting or diarrhea. -Repeat BMP with PCP in 2 to 4 weeks    Advance Care Planning: Full code  Consults: Cardiology  Family Communication: Husband and son at the bedside  Severity of Illness: The appropriate patient status for this patient is OBSERVATION. Observation status is judged to be reasonable and necessary in order to provide the required intensity of service to ensure the patient's safety. The patient's presenting symptoms, physical exam findings, and initial radiographic and laboratory data in the context of their medical condition is felt to place them at decreased risk for further clinical deterioration. Furthermore, it is anticipated that the patient will be medically stable for discharge from the hospital within 2 midnights of admission.   Author: Alberteen Sam, MD 06/30/2022 12:06 PM  For on call review [email or web address].

## 2022-06-30 NOTE — Consult Note (Addendum)
 Cardiology Consultation   Patient ID: Jill Howard MRN: [id number]; DOB: 04/07/1940  Admit date: 06/29/2022 Date of Consult: 06/30/2022  PCP:  Glaspie, Andrea M, PA-C   [facility] Providers Cardiologist:  Jonathan Berry, MD        Patient Profile:   Jill Howard is a 81 y.o. female with a hx of hypertension, hyperlipidemia, fibromyalgia, vascular dementia, type 2 diabetes, CVA who is being seen 06/30/2022 for the evaluation of pain and shortness of breath at the request of Dr. Danford.  History of Present Illness:   Ms. Paragas presented to the emergency department on 6/5 reporting symptoms of shortness of breath and chest pain.  This episode that prompted visit to the ED occurred when patient walked about a half of a block and then had sudden onset shortness of breath, chest pain, and tachycardia.  Per patient symptoms improved with rest but did not completely go away and she was seen by her PCP who ultimately called for EMS and had patient transported to the ED.  Patient is known to cardiology and was initially evaluated several years ago for symptoms of palpitations.  Patient has worn a heart monitor that was not revealing of any significant tacky arrhythmias.  Patient says that her symptoms have generally been stable over the last few years but in the last couple of weeks she has developed more significant and frequent tachycardia.  Patient says that she has woken up during the night several times with rapid heart rate and sensation of shortness of breath and chest tightness.  This is also occurred while at rest, and occurs very frequently with exertion.  Patient has noticed increased lower extremity edema in the same timeframe, left greater than right.  Denies orthopnea however and only sleeps on 1 pillow at night.  Patient does feel as though her exertional tolerance is decreased in the last couple of weeks.   Past Medical History:  Diagnosis Date   Diabetes mellitus without  complication (HCC)    Gallstones    Hypertension    Kidney stone    Kidney stone    Stroke (HCC) 10/24/1996   Vertigo     Past Surgical History:  Procedure Laterality Date   ABDOMINAL HYSTERECTOMY     KIDNEY STONE SURGERY       Home Medications:  Prior to Admission medications   Medication Sig Start Date End Date Taking? Authorizing Provider  ALPRAZolam (XANAX) 0.5 MG tablet Take 1-2 tablets thirty minutes prior to MRI.  May take one additional tablet before entering scanner, if needed.  MUST HAVE DRIVER. 05/03/22   Sethi, Pramod S, MD  amLODipine (NORVASC) 10 MG tablet TAKE ONE (1) TABLET BY MOUTH EVERY DAY 01/25/22   Berry, Jonathan J, MD  aspirin 325 MG tablet Take 325 mg by mouth daily.    [provider]  atorvastatin (LIPITOR) 80 MG tablet Take 1 tablet (80 mg total) by mouth at bedtime. 10/25/16   Rai, Ripudeep K, MD  cetirizine (ZYRTEC) 10 MG tablet Take 10 mg by mouth daily.    [provider]  clopidogrel (PLAVIX) 75 MG tablet Take 1 tablet (75 mg total) by mouth daily. 10/26/16   Rai, Ripudeep K, MD  Continuous Blood Gluc Transmit (DEXCOM G6 TRANSMITTER) MISC Use as directed for continuous glucose monitoring. Reuse transmitter for 90 days then discard and replace. 11/27/19   [provider]  Cyanocobalamin (VITAMIN B-12 PO) Take 5,000 mg by mouth daily.    [provider]    diclofenac sodium (VOLTAREN) 1 % GEL Apply 2 g topically as needed.  11/03/17   [provider]  ezetimibe (ZETIA) 10 MG tablet Take 1 tablet (10 mg total) by mouth daily. 12/30/20   Berry, Jonathan J, MD  glimepiride (AMARYL) 4 MG tablet Take 4 mg by mouth 2 (two) times daily. 03/28/19   [provider]  HUMALOG KWIKPEN 100 UNIT/ML KwikPen Inject into the skin. 06/25/20   [provider]  Insulin Glargine (TOUJEO SOLOSTAR [place]) Inject 45-50 Units into the skin at bedtime.     [provider]  lisinopril (PRINIVIL,ZESTRIL) 20 MG tablet Take 20 mg by  mouth daily. Take 1 tablet by mouth in the mornings 09/09/16   [provider]  Loratadine (CLARITIN PO) Take 1 tablet by mouth daily as needed.     [provider]  Magnesium 250 MG TABS Take 250 mg by mouth daily.    [provider]  metFORMIN (GLUCOPHAGE) 1000 MG tablet Take 1,000 mg by mouth 2 (two) times daily with a meal.    [provider]  metoprolol tartrate (LOPRESSOR) 25 MG tablet TAKE 1 TABLET IN THE MORNING AND 2 AT BEDTIME ALONG WITH 50MG TABLET FOR TOTALOF 75MG IN THE MORNING AND 100MG AT BEDTIME 06/02/22   Berry, Jonathan J, MD  metoprolol tartrate (LOPRESSOR) 50 MG tablet Take 1 tablet (50 mg total) by mouth 2 (two) times daily. 01/18/22   Berry, Jonathan J, MD  Multiple Vitamin (MULTIVITAMIN PO) Take by mouth daily.    [provider]  Omega-3 Fatty Acids (FISH OIL) 1000 MG CAPS Take 1,000 mg by mouth daily.     [provider]  TOUJEO MAX SOLOSTAR 300 UNIT/ML Solostar Pen Inject into the skin. 03/18/20   [provider]  VITAMIN D PO Take 2,000 mg by mouth.     [provider]    Inpatient Medications: Scheduled Meds:  amLODipine  10 mg Oral Daily   aspirin EC  81 mg Oral Daily   atorvastatin  80 mg Oral QHS   clopidogrel  75 mg Oral Daily   enoxaparin (LOVENOX) injection  40 mg Subcutaneous Q24H   ezetimibe  10 mg Oral Daily   insulin aspart  0-15 Units Subcutaneous TID WC   insulin aspart  0-15 Units Subcutaneous Q4H   insulin aspart  0-5 Units Subcutaneous QHS   insulin glargine-yfgn  15 Units Subcutaneous QHS   Continuous Infusions:  PRN Meds: acetaminophen **OR** acetaminophen  Allergies:    Allergies  Allergen Reactions   Influenza Vaccines Swelling    Arm was swollen in 2002    Sitagliptin Swelling   Guaifenesin Other (See Comments)    Unknown   Hydroxychloroquine Other (See Comments)    Eye pain   Latex Other (See Comments)    Unknown   Nitrofurantoin Nausea Only   Other Other  (See Comments)    Band-Aid   Phenylephrine-Pheniramine-Dm Other (See Comments)    Unknown   Ultram [Tramadol Hcl] Other (See Comments)    hallucinations   Escitalopram Oxalate Other (See Comments)    Hallucinations    Social History:   Social History   Socioeconomic History   Marital status: Married    Spouse name: Not on file   Number of children: Not on file   Years of education: Not on file   Highest education level: Not on file  Occupational History   Not on file  Tobacco Use   Smoking status: Former      Types: Cigarettes    Quit date: 1983    Years since quitting: 41.4   Smokeless tobacco: Never  Vaping Use   Vaping Use: Never used  Substance and Sexual Activity   Alcohol use: No   Drug use: No   Sexual activity: Not Currently  Other Topics Concern   Not on file  Social History Narrative   Not on file   Social Determinants of Health   Financial Resource Strain: Not on file  Food Insecurity: Not on file  Transportation Needs: Not on file  Physical Activity: Not on file  Stress: Not on file  Social Connections: Not on file  Intimate Partner Violence: Not on file    Family History:    Family History  Problem Relation Age of Onset   Stroke Other    Heart attack Other    Cancer Other    Diabetes Other    Stroke Mother    Heart attack Mother    Stroke Maternal Aunt    Stroke Paternal Aunt    Rheum arthritis Paternal Aunt    Stroke Brother    Heart attack Brother    COPD Brother    Rheum arthritis Paternal Aunt    Diabetes Son    Heart disease Son    Diabetes Daughter      ROS:  Please see the history of present illness.   All other ROS reviewed and negative.     Physical Exam/Data:   Vitals:   06/30/22 0200 06/30/22 0253 06/30/22 0345 06/30/22 0649  BP: (!) 142/46  (!) 145/39 (!) 154/57  Pulse: 72  63 64  Resp: 15  18 18  Temp:  98 F (36.7 C)  98.4 F (36.9 C)  TempSrc:    Oral  SpO2: 97%  95% 100%   No intake or output data in  the 24 hours ending 06/30/22 1049    03/28/2022    9:52 AM 01/04/2022   10:41 AM 10/28/2021   10:52 AM  Last 3 Weights  Weight (lbs) 174 lb 8 oz 177 lb 176 lb 5.9 oz  Weight (kg) 79.153 kg 80.287 kg 80 kg     There is no height or weight on file to calculate BMI.  General:  Well nourished, well developed, in no acute distress HEENT: normal Neck: no JVD Vascular: No carotid bruits; Distal pulses 2+ bilaterally Cardiac:  normal S1, S2; RRR; no murmur  Lungs:  clear to auscultation bilaterally, no wheezing, rhonchi or rales  Abd: soft, nontender, no hepatomegaly  Ext: no edema Musculoskeletal:  No deformities, BUE and BLE strength normal and equal Skin: warm and dry  Neuro:  CNs 2-12 intact, no focal abnormalities noted Psych:  Normal affect   EKG:  The EKG was personally reviewed and demonstrates: Sinus rhythm with first-degree AV block.  No acute ischemic changes Telemetry:  Telemetry was personally reviewed and demonstrates:  sinus rhythm  Relevant CV Studies:  03/24/2017 TTE  Study Conclusions   - Left ventricle: The cavity size was normal. There was mild    concentric hypertrophy. Systolic function was normal. The    estimated ejection fraction was in the range of 60% to 65%. Wall    motion was normal; there were no regional wall motion    abnormalities. Doppler parameters are consistent with abnormal    left ventricular relaxation (grade 1 diastolic dysfunction).    Doppler parameters are consistent with indeterminate ventricular    filling pressure.  - Aortic valve: Transvalvular   velocity was within the normal range.    There was no stenosis. There was no regurgitation. Valve area    (VTI): 2.06 cm^2. Valve area (Vmax): 1.95 cm^2. Valve area    (Vmean): 1.99 cm^2.  - Mitral valve: Transvalvular velocity was within the normal range.    There was no evidence for stenosis. There was trivial    regurgitation.  - Right ventricle: The cavity size was normal. Wall thickness  was    normal. Systolic function was normal.  - Atrial septum: No defect or patent foramen ovale was identified.  - Tricuspid valve: There was trivial regurgitation.  - Pulmonary arteries: PA peak pressure: 16 mm Hg (S).   Laboratory Data:  High Sensitivity Troponin:   Recent Labs  Lab 06/29/22 1708 06/29/22 2056  TROPONINIHS 5 5     Chemistry Recent Labs  Lab 06/29/22 1708  NA 138  K 3.5  CL 111  CO2 15*  GLUCOSE 128*  BUN 15  CREATININE 0.64  CALCIUM 7.8*  GFRNONAA >60  ANIONGAP 12    Recent Labs  Lab 06/29/22 2350  PROT 6.4*  ALBUMIN 3.0*  AST 17  ALT 19  ALKPHOS 59  BILITOT 0.7   Lipids No results for input(s): "CHOL", "TRIG", "HDL", "LABVLDL", "LDLCALC", "CHOLHDL" in the last 168 hours.  Hematology Recent Labs  Lab 06/29/22 1708  WBC 6.8  RBC 3.56*  HGB 10.3*  HCT 32.8*  MCV 92.1  MCH 28.9  MCHC 31.4  RDW 13.2  PLT 165   Thyroid No results for input(s): "TSH", "FREET4" in the last 168 hours.  BNP Recent Labs  Lab 06/29/22 2350  BNP 99.9    DDimer  Recent Labs  Lab 06/29/22 2350  DDIMER 0.94*     Radiology/Studies:  CT Angio Chest PE W and/or Wo Contrast  Result Date: 06/30/2022 CLINICAL DATA:  Chest pain which is sharp and substernal radiating to the esophagus. Resolved with sub lingual nitro. EXAM: CT ANGIOGRAPHY CHEST WITH CONTRAST TECHNIQUE: Multidetector CT imaging of the chest was performed using the standard protocol during bolus administration of intravenous contrast. Multiplanar CT image reconstructions and MIPs were obtained to evaluate the vascular anatomy. RADIATION DOSE REDUCTION: This exam was performed according to the departmental dose-optimization program which includes automated exposure control, adjustment of the mA and/or kV according to patient size and/or use of iterative reconstruction technique. CONTRAST:  75mL OMNIPAQUE IOHEXOL 350 MG/ML SOLN COMPARISON:  Chest radiograph 06/29/2022 and CTA chest 03/01/2019 FINDINGS:  Cardiovascular: Satisfactory opacification of the pulmonary arteries to the segmental level. No evidence of pulmonary embolism. Normal heart size. No pericardial effusion. Coronary artery and aortic atherosclerotic calcification. Mediastinum/Nodes: No enlarged mediastinal, hilar, or axillary lymph nodes. Thyroid gland, trachea, and esophagus demonstrate no significant findings. Lungs/Pleura: Mosaic attenuation of the lungs compatible with air trapping. No focal consolidation, pleural effusion or pneumothorax. Upper Abdomen: No acute abnormality. Mildly nodular hepatic contour suggestive of cirrhosis. Unchanged left hepatic lobe cyst. Cholelithiasis. Musculoskeletal: No chest wall abnormality. No acute osseous findings. Review of the MIP images confirms the above findings. IMPRESSION: 1. No evidence of pulmonary embolism. 2. Mosaic attenuation of the lungs compatible with air trapping. 3. Advanced coronary artery atherosclerosis 4. Cirrhosis. Aortic Atherosclerosis (ICD10-I70.0). Electronically Signed   By: Tyler  Stutzman M.D.   On: 06/30/2022 03:31   DG Chest 2 View  Result Date: 06/29/2022 CLINICAL DATA:  Chest pain EXAM: CHEST - 2 VIEW COMPARISON:  X-ray 10/28/2021 and older FINDINGS: No consolidation, pneumothorax or effusion. Normal   cardiopericardial silhouette without edema. Mild degenerative changes of the spine with some curvature. Aortic calcifications. IMPRESSION: No acute cardiopulmonary disease. Electronically Signed   By: Ashok  Gupta M.D.   On: 06/29/2022 17:38     Assessment and Plan:   Chest pain, concerning for accelerating angina/unstable angina Exertional dyspnea  Patient with increasing frequency/severity of shortness of breath and chest pain occurring in the setting of tachycardia.  Troponin this admission negative/flat.  Chest CTA negative for PE but did notice diffuse and significant coronary artery calcification.  ECG without acute ischemic changes.  Patient's calcifications as  noted on CT along with multiple risk factors certainly concerning for CAD despite negative troponin. Symptoms consistent with accelerating angina. It is possible that patient may be having transient demand ischemia in the setting of tachycardic episodes with underlying CAD (no telemetry supporting tachy-arrhythmia this admission).  Unfortunately her calcium burden and coronary arteries is significant enough to preclude use of cardiac CTA to define coronary anatomy.  Given that it appears to be three-vessel calcification, I also feel that stress Myoview is likely to be falsely negative with possible balanced ischemia.  Left heart catheterization would be the best means of defining patient's coronary anatomy. Patient will also need an updated echocardiogram.  This has been ordered.  Will discuss timing of left heart cath further with Dr. Vernida Mcnicholas.  Could be done later this afternoon as patient has been n.p.o. or tomorrow. Patient already on ASA/Plavix s/p CVA.  Tachycardia  Patient with multi-year hx palpitations and sensation of tachycardia. Prior Zio monitoring has been non-diagnostic for tachy-arrhythmia or atrial fibrillation.   Continue Metoprolol for rate control. Patient has been taking 75mg AM, 100mg PM. Given lack of tachycardia on telemetry even with this held, would not up-titrate dose at this time Consider repeating outpatient heart monitor.  Hypertension  BP slightly elevated today. Continue home Amlodipine, Lisinopril 20mg  Hyperlipidemia  Patient without recent lipid panel per my review of labs. Will check lipid panel and lipoprotein A tomorrow. Continue statin.  Risk Assessment/Risk Scores:     TIMI Risk Score for Unstable Angina or Non-ST Elevation MI:   The patient's TIMI risk score is  , which indicates a  % risk of all cause mortality, new or recurrent myocardial infarction or need for urgent revascularization in the next 14 days.          For questions or updates,  please contact [facility] Please consult [email or web address] for contact info under    Signed, Evan Williams, PA-C  06/30/2022 10:49 AM  Patient seen and examined and agree with Evan Williams, PA-C as detailed above.  In brief, the patient is a 81 year old female with history of HTN, HLD, fibromyalgia, DMII, vascular dementia (mild) and prior CVA who presented to the ER with chest pain, palpitations and SOB for which Cardiology was consulted.  Patient states that she has been suffering from "tachycardia" for several years and has had frequent palpitations. Over the past two weeks, however, her symptoms have worsened where she is now having episodes of palpitations with associated chest pain radiating to her jaw as well as SOB. Symptoms can occur with exertion or at rest but are worse with exertion. Given progression of symptoms, she decided to come to the ER for further evaluation.  In the ER, trop negative and ECG non-ischemic. CTA PE protocol negative for PE but with very extensive coronary artery calcification. Given symptoms and degree of coronary artery calcification as well as risk factors,   will proceed with LHC for further evaluation. Not a coronary CTA candidate due to severity of Ca. This was discussed extensively with the patient and her two sons at bedside.  GEN: No acute distress.   Neck: No JVD Cardiac: RRR, no murmurs, rubs, or gallops.  Respiratory: Clear to auscultation bilaterally. GI: Soft, nontender, non-distended  MS: 2+ LLE edema, trace RLE edema. Warm. Neuro:  Nonfocal  Psych: Normal affect    Plan: -Plan for LHC today -Follow-up TTE -Continue ASA 81mg daily, plavix 75mg daily (on DAPT for history of CVA) -Continue lipitor 80mg daily, zetia 10mg daily -Resume metop at 50mg BID -Resume ACE post cath -Plan discussed extensively with the patient and her sons who are amenable to proceed  INFORMED CONSENT: I have reviewed the risks, indications, and alternatives  to cardiac catheterization, possible angioplasty, and stenting with the patient. Risks include but are not limited to bleeding, infection, vascular injury, stroke, myocardial infection, arrhythmia, kidney injury, radiation-related injury in the case of prolonged fluoroscopy use, emergency cardiac surgery, and death. The patient understands the risks of serious complication is 1-2 in 1000 with diagnostic cardiac cath and 1-2% or less with angioplasty/stenting.    Gregoire Bennis, MD  

## 2022-06-30 NOTE — Hospital Course (Addendum)
Jill Howard is a 82 y.o. 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  shortness of breath with walking accompanied by mid chest pain which radiates into the lower jaw intermittently for the last 1 week. Reports that when it first started, it would resolve with rest and approximately 5 minutes. Today, it took approximately 15 or 20 minutes to resolve with res t  In the ER, trops normal, ECG showed mild nonspecific abnormality, unchanged from prior.  CTA chest showed no PE but heavy calcification of coronary arteries.  Given aspirin and Cardiology consulted for risk stratification.

## 2022-06-30 NOTE — Interval H&P Note (Signed)
History and Physical Interval Note:  06/30/2022 3:44 PM  Jill Howard  has presented today for surgery, with the diagnosis of Unstable angina.  The various methods of treatment have been discussed with the patient and family. After consideration of risks, benefits and other options for treatment, the patient has consented to  Procedure(s): LEFT HEART CATH AND CORONARY ANGIOGRAPHY (N/A) as a surgical intervention.  The patient's history has been reviewed, patient examined, no change in status, stable for surgery.  I have reviewed the patient's chart and labs.  Questions were answered to the patient's satisfaction.     Tonny Bollman

## 2022-06-30 NOTE — ED Notes (Signed)
ED TO INPATIENT HANDOFF REPORT  ED Nurse Name and Phone #: Markis Langland RN [phone]  S Name/Age/Gender [name] 82 y.o. female Room/Bed: 041C/041C  Code Status   Code Status: Prior  Home/SNF/Other Home Patient oriented to: self, place, time, and situation Is this baseline? Yes   Triage Complete: Triage complete  Chief Complaint Chest pain [R07.9]  Triage Note Pt BIB [facility] from home d/t exertional SOB & CP the last week. Also has abd distention & Lt leg swelling for the past year. Central CP is described as burning/aching & radiate to both jaw. 136/89, 63 bpm, 98% RA, CBG 178, 20g Lt hand PIV. Pain relief with 324 ASA & 1 Nitro, A/Ox4.   Allergies Allergies  Allergen Reactions   Influenza Vaccines Swelling    Arm was swollen in 2002    Sitagliptin Swelling   Guaifenesin Other (See Comments)    Unknown   Hydroxychloroquine Other (See Comments)    Eye pain   Latex Other (See Comments)    Unknown   Nitrofurantoin Nausea Only   Other Other (See Comments)    Band-Aid   Phenylephrine-Pheniramine-Dm Other (See Comments)    Unknown   Ultram [Tramadol Hcl] Other (See Comments)    hallucinations   Escitalopram Oxalate Other (See Comments)    Hallucinations    Level of Care/Admitting Diagnosis ED Disposition     ED Disposition  Admit   Condition  --   Comment  Hospital Area: [facility] [[id number]]  Level of Care: Telemetry Cardiac [[id number]]  May place patient in observation at [facility] or [facility] if equivalent level of care is available:: Yes  Covid Evaluation: Asymptomatic - no recent exposure (last 10 days) testing not required  Diagnosis: Chest pain [454098]  Admitting Physician: Briscoe Deutscher [[id number]]  Attending Physician: Briscoe Deutscher [[id number]]          B Medical/Surgery History Past Medical History:  Diagnosis Date   Diabetes mellitus without complication (HCC)    Gallstones    Hypertension    Kidney stone    Kidney stone     Stroke (HCC) 10/24/1996   Vertigo    Past Surgical History:  Procedure Laterality Date   ABDOMINAL HYSTERECTOMY     KIDNEY STONE SURGERY       A IV Location/Drains/Wounds Patient Lines/Drains/Airways Status     Active Line/Drains/Airways     Name Placement date Placement time Site Days   Peripheral IV 06/29/22 20 G Left Hand 06/29/22  1400  Hand  1   Peripheral IV 06/30/22 20 G Posterior;Right Forearm 06/30/22  0244  Forearm  less than 1            Intake/Output Last 24 hours No intake or output data in the 24 hours ending 06/30/22 0532  Labs/Imaging Results for orders placed or performed during the hospital encounter of 06/29/22 (from the past 48 hour(s))  Basic metabolic panel     Status: Abnormal   Collection Time: 06/29/22  5:08 PM  Result Value Ref Range   Sodium 138 135 - 145 mmol/L   Potassium 3.5 3.5 - 5.1 mmol/L   Chloride 111 98 - 111 mmol/L   CO2 15 (L) 22 - 32 mmol/L   Glucose, Bld 128 (H) 70 - 99 mg/dL    Comment: Glucose reference range applies only to samples taken after fasting for at least 8 hours.   BUN 15 8 - 23 mg/dL   Creatinine, Ser 0.86 0.44 - 1.00  mg/dL   Calcium 7.8 (L) 8.9 - 10.3 mg/dL   GFR, Estimated >08 >65 mL/min    Comment: (NOTE) Calculated using the CKD-EPI Creatinine Equation (2021)    Anion gap 12 5 - 15    Comment: Performed at [facility], [address], [place], [place] [address]  CBC     Status: Abnormal   Collection Time: 06/29/22  5:08 PM  Result Value Ref Range   WBC 6.8 4.0 - 10.5 K/uL   RBC 3.56 (L) 3.87 - 5.11 MIL/uL   Hemoglobin 10.3 (L) 12.0 - 15.0 g/dL   HCT 62.9 (L) 52.8 - 41.3 %   MCV 92.1 80.0 - 100.0 fL   MCH 28.9 26.0 - 34.0 pg   MCHC 31.4 30.0 - 36.0 g/dL   RDW 24.4 01.0 - 27.2 %   Platelets 165 150 - 400 K/uL   nRBC 0.0 0.0 - 0.2 %    Comment: Performed at [facility], [address], [place], [place] [address]  Troponin I (High Sensitivity)     Status: None   Collection Time:  06/29/22  5:08 PM  Result Value Ref Range   Troponin I (High Sensitivity) 5 <18 ng/L    Comment: (NOTE) Elevated high sensitivity troponin I (hsTnI) values and significant  changes across serial measurements may suggest ACS but many other  chronic and acute conditions are known to elevate hsTnI results.  Refer to the "Links" section for chest pain algorithms and additional  guidance. Performed at [facility], [address] [place], [place] [address]   Troponin I (High Sensitivity)     Status: None   Collection Time: 06/29/22  8:56 PM  Result Value Ref Range   Troponin I (High Sensitivity) 5 <18 ng/L    Comment: (NOTE) Elevated high sensitivity troponin I (hsTnI) values and significant  changes across serial measurements may suggest ACS but many other  chronic and acute conditions are known to elevate hsTnI results.  Refer to the "Links" section for chest pain algorithms and additional  guidance. Performed at [facility], [address], [place], [place] [address]   Hepatic function panel     Status: Abnormal   Collection Time: 06/29/22 11:50 PM  Result Value Ref Range   Total Protein 6.4 (L) 6.5 - 8.1 g/dL   Albumin 3.0 (L) 3.5 - 5.0 g/dL   AST 17 15 - 41 U/L   ALT 19 0 - 44 U/L   Alkaline Phosphatase 59 38 - 126 U/L   Total Bilirubin 0.7 0.3 - 1.2 mg/dL   Bilirubin, Direct 0.1 0.0 - 0.2 mg/dL   Indirect Bilirubin 0.6 0.3 - 0.9 mg/dL    Comment: Performed at [facility], [address], [place], [place] [address]  Lipase, blood     Status: None   Collection Time: 06/29/22 11:50 PM  Result Value Ref Range   Lipase 33 11 - 51 U/L    Comment: Performed at [facility], [address], [place], [place] [address]  D-dimer, quantitative     Status: Abnormal   Collection Time: 06/29/22 11:50 PM  Result Value Ref Range   D-Dimer, Quant 0.94 (H) 0.00 - 0.50 ug/mL-FEU    Comment: (NOTE) At the manufacturer cut-off value of 0.5 g/mL FEU, this assay  has a negative predictive value of 95-100%.This assay is intended for use in conjunction with a clinical pretest probability (PTP) assessment model to exclude pulmonary  embolism (PE) and deep venous thrombosis (DVT) in outpatients suspected of PE or DVT. Results should be correlated with clinical presentation. Performed at [facility], [address], [place], [place] [address]   Brain natriuretic peptide     Status: None   Collection Time: 06/29/22 11:50 PM  Result Value Ref Range   B Natriuretic Peptide 99.9 0.0 - 100.0 pg/mL    Comment: Performed at [facility], [address] [place], [place] [address]   CT Angio Chest PE W and/or Wo Contrast  Result Date: 06/30/2022 CLINICAL DATA:  Chest pain which is sharp and substernal radiating to the esophagus. Resolved with sub lingual nitro. EXAM: CT ANGIOGRAPHY CHEST WITH CONTRAST TECHNIQUE: Multidetector CT imaging of the chest was performed using the standard protocol during bolus administration of intravenous contrast. Multiplanar CT image reconstructions and MIPs were obtained to evaluate the vascular anatomy. RADIATION DOSE REDUCTION: This exam was performed according to the departmental dose-optimization program which includes automated exposure control, adjustment of the mA and/or kV according to patient size and/or use of iterative reconstruction technique. CONTRAST:  75mL OMNIPAQUE IOHEXOL 350 MG/ML SOLN COMPARISON:  Chest radiograph 06/29/2022 and CTA chest 03/01/2019 FINDINGS: Cardiovascular: Satisfactory opacification of the pulmonary arteries to the segmental level. No evidence of pulmonary embolism. Normal heart size. No pericardial effusion. Coronary artery and aortic atherosclerotic calcification. Mediastinum/Nodes: No enlarged mediastinal, hilar, or axillary lymph nodes. Thyroid gland, trachea, and esophagus demonstrate no significant findings. Lungs/Pleura: Mosaic attenuation of the lungs compatible with air trapping. No  focal consolidation, pleural effusion or pneumothorax. Upper Abdomen: No acute abnormality. Mildly nodular hepatic contour suggestive of cirrhosis. Unchanged left hepatic lobe cyst. Cholelithiasis. Musculoskeletal: No chest wall abnormality. No acute osseous findings. Review of the MIP images confirms the above findings. IMPRESSION: 1. No evidence of pulmonary embolism. 2. Mosaic attenuation of the lungs compatible with air trapping. 3. Advanced coronary artery atherosclerosis 4. Cirrhosis. Aortic Atherosclerosis (ICD10-I70.0). Electronically Signed   By: Minerva Fester M.D.   On: 06/30/2022 03:31   DG Chest 2 View  Result Date: 06/29/2022 CLINICAL DATA:  Chest pain EXAM: CHEST - 2 VIEW COMPARISON:  X-ray 10/28/2021 and older FINDINGS: No consolidation, pneumothorax or effusion. Normal cardiopericardial silhouette without edema. Mild degenerative changes of the spine with some curvature. Aortic calcifications. IMPRESSION: No acute cardiopulmonary disease. Electronically Signed   By: Karen Kays M.D.   On: 06/29/2022 17:38    Pending Labs Unresulted Labs (From admission, onward)    None       Vitals/Pain Today's Vitals   06/30/22 0115 06/30/22 0200 06/30/22 0233 06/30/22 0253  BP: (!) 133/48 (!) 142/46    Pulse: 63 72    Resp: 11 15    Temp:    98 F (36.7 C)  TempSrc:      SpO2: 99% 97%    PainSc:   2      Isolation Precautions No active isolations  Medications Medications  aspirin chewable tablet 324 mg (324 mg Oral Given 06/29/22 2118)  iohexol (OMNIPAQUE) 350 MG/ML injection 75 mL (75 mLs Intravenous Contrast Given 06/30/22 0320)    Mobility walks     Focused Assessments Patient alert and oriented   R Recommendations: See Admitting Provider Note  Report given to:   Additional Notes: .

## 2022-06-30 NOTE — H&P (View-Only) (Signed)
Cardiology Consultation   Patient ID: Jill Howard MRN: [id number]; DOB: 12/01/1940  Admit date: 06/29/2022 Date of Consult: 06/30/2022  PCP:  Trenda Moots, PA-C   [facility] Providers Cardiologist:  Nanetta Batty, MD        Patient Profile:   Jill Howard is a 82 y.o. female with a hx of hypertension, hyperlipidemia, fibromyalgia, vascular dementia, type 2 diabetes, CVA who is being seen 06/30/2022 for the evaluation of pain and shortness of breath at the request of Dr. Maryfrances Bunnell.  History of Present Illness:   Jill Howard presented to the emergency department on 6/5 reporting symptoms of shortness of breath and chest pain.  This episode that prompted visit to the ED occurred when patient walked about a half of a block and then had sudden onset shortness of breath, chest pain, and tachycardia.  Per patient symptoms improved with rest but did not completely go away and she was seen by her PCP who ultimately called for EMS and had patient transported to the ED.  Patient is known to cardiology and was initially evaluated several years ago for symptoms of palpitations.  Patient has worn a heart monitor that was not revealing of any significant tacky arrhythmias.  Patient says that her symptoms have generally been stable over the last few years but in the last couple of weeks she has developed more significant and frequent tachycardia.  Patient says that she has woken up during the night several times with rapid heart rate and sensation of shortness of breath and chest tightness.  This is also occurred while at rest, and occurs very frequently with exertion.  Patient has noticed increased lower extremity edema in the same timeframe, left greater than right.  Denies orthopnea however and only sleeps on 1 pillow at night.  Patient does feel as though her exertional tolerance is decreased in the last couple of weeks.   Past Medical History:  Diagnosis Date   Diabetes mellitus without  complication (HCC)    Gallstones    Hypertension    Kidney stone    Kidney stone    Stroke [facility]) 10/24/1996   Vertigo     Past Surgical History:  Procedure Laterality Date   ABDOMINAL HYSTERECTOMY     KIDNEY STONE SURGERY       Home Medications:  Prior to Admission medications   Medication Sig Start Date End Date Taking? Authorizing Provider  ALPRAZolam Prudy Feeler) 0.5 MG tablet Take 1-2 tablets thirty minutes prior to MRI.  May take one additional tablet before entering scanner, if needed.  MUST HAVE DRIVER. 03/04/50   Micki Riley, MD  amLODipine (NORVASC) 10 MG tablet TAKE ONE (1) TABLET BY MOUTH EVERY DAY 01/25/22   Runell Gess, MD  aspirin 325 MG tablet Take 325 mg by mouth daily.    [provider]  atorvastatin (LIPITOR) 80 MG tablet Take 1 tablet (80 mg total) by mouth at bedtime. 10/25/16   Rai, Delene Ruffini, MD  cetirizine (ZYRTEC) 10 MG tablet Take 10 mg by mouth daily.    [provider]  clopidogrel (PLAVIX) 75 MG tablet Take 1 tablet (75 mg total) by mouth daily. 10/26/16   Rai, Ripudeep K, MD  Continuous Blood Gluc Transmit (DEXCOM G6 TRANSMITTER) MISC Use as directed for continuous glucose monitoring. Reuse transmitter for 90 days then discard and replace. 11/27/19   [provider]  Cyanocobalamin (VITAMIN B-12 PO) Take 5,000 mg by mouth daily.    [provider]  diclofenac sodium (VOLTAREN) 1 % GEL Apply 2 g topically as needed.  11/03/17   [provider]  ezetimibe (ZETIA) 10 MG tablet Take 1 tablet (10 mg total) by mouth daily. 12/30/20   Runell Gess, MD  glimepiride (AMARYL) 4 MG tablet Take 4 mg by mouth 2 (two) times daily. 03/28/19   [provider]  HUMALOG KWIKPEN 100 UNIT/ML KwikPen Inject into the skin. 06/25/20   [provider]  Insulin Glargine (TOUJEO SOLOSTAR [place]) Inject 45-50 Units into the skin at bedtime.     [provider]  lisinopril (PRINIVIL,ZESTRIL) 20 MG tablet Take 20 mg by  mouth daily. Take 1 tablet by mouth in the mornings 09/09/16   [provider]  Loratadine (CLARITIN PO) Take 1 tablet by mouth daily as needed.     [provider]  Magnesium 250 MG TABS Take 250 mg by mouth daily.    [provider]  metFORMIN (GLUCOPHAGE) 1000 MG tablet Take 1,000 mg by mouth 2 (two) times daily with a meal.    [provider]  metoprolol tartrate (LOPRESSOR) 25 MG tablet TAKE 1 TABLET IN THE MORNING AND 2 AT BEDTIME ALONG WITH 50MG  TABLET FOR TOTALOF 75MG  IN THE MORNING AND 100MG  AT BEDTIME 06/02/22   Runell Gess, MD  metoprolol tartrate (LOPRESSOR) 50 MG tablet Take 1 tablet (50 mg total) by mouth 2 (two) times daily. 01/18/22   Runell Gess, MD  Multiple Vitamin (MULTIVITAMIN PO) Take by mouth daily.    [provider]  Omega-3 Fatty Acids (FISH OIL) 1000 MG CAPS Take 1,000 mg by mouth daily.     [provider]  TOUJEO MAX SOLOSTAR 300 UNIT/ML Solostar Pen Inject into the skin. 03/18/20   [provider]  VITAMIN D PO Take 2,000 mg by mouth.     [provider]    Inpatient Medications: Scheduled Meds:  amLODipine  10 mg Oral Daily   aspirin EC  81 mg Oral Daily   atorvastatin  80 mg Oral QHS   clopidogrel  75 mg Oral Daily   enoxaparin (LOVENOX) injection  40 mg Subcutaneous Q24H   ezetimibe  10 mg Oral Daily   insulin aspart  0-15 Units Subcutaneous TID WC   insulin aspart  0-15 Units Subcutaneous Q4H   insulin aspart  0-5 Units Subcutaneous QHS   insulin glargine-yfgn  15 Units Subcutaneous QHS   Continuous Infusions:  PRN Meds: acetaminophen **OR** acetaminophen  Allergies:    Allergies  Allergen Reactions   Influenza Vaccines Swelling    Arm was swollen in 2002    Sitagliptin Swelling   Guaifenesin Other (See Comments)    Unknown   Hydroxychloroquine Other (See Comments)    Eye pain   Latex Other (See Comments)    Unknown   Nitrofurantoin Nausea Only   Other Other  (See Comments)    Band-Aid   Phenylephrine-Pheniramine-Dm Other (See Comments)    Unknown   Ultram [Tramadol Hcl] Other (See Comments)    hallucinations   Escitalopram Oxalate Other (See Comments)    Hallucinations    Social History:   Social History   Socioeconomic History   Marital status: Married    Spouse name: Not on file   Number of children: Not on file   Years of education: Not on file   Highest education level: Not on file  Occupational History   Not on file  Tobacco Use   Smoking status: Former  Types: Cigarettes    Quit date: 63    Years since quitting: 41.4   Smokeless tobacco: Never  Vaping Use   Vaping Use: Never used  Substance and Sexual Activity   Alcohol use: No   Drug use: No   Sexual activity: Not Currently  Other Topics Concern   Not on file  Social History Narrative   Not on file   Social Determinants of Health   Financial Resource Strain: Not on file  Food Insecurity: Not on file  Transportation Needs: Not on file  Physical Activity: Not on file  Stress: Not on file  Social Connections: Not on file  Intimate Partner Violence: Not on file    Family History:    Family History  Problem Relation Age of Onset   Stroke Other    Heart attack Other    Cancer Other    Diabetes Other    Stroke Mother    Heart attack Mother    Stroke Maternal Aunt    Stroke Paternal Aunt    Rheum arthritis Paternal Aunt    Stroke Brother    Heart attack Brother    COPD Brother    Rheum arthritis Paternal Aunt    Diabetes Son    Heart disease Son    Diabetes Daughter      ROS:  Please see the history of present illness.   All other ROS reviewed and negative.     Physical Exam/Data:   Vitals:   06/30/22 0200 06/30/22 0253 06/30/22 0345 06/30/22 0649  BP: (!) 142/46  (!) 145/39 (!) 154/57  Pulse: 72  63 64  Resp: 15  18 18   Temp:  98 F (36.7 C)  98.4 F (36.9 C)  TempSrc:    Oral  SpO2: 97%  95% 100%   No intake or output data in  the 24 hours ending 06/30/22 1049    03/28/2022    9:52 AM 01/04/2022   10:41 AM 10/28/2021   10:52 AM  Last 3 Weights  Weight (lbs) 174 lb 8 oz 177 lb 176 lb 5.9 oz  Weight (kg) 79.153 kg 80.287 kg 80 kg     There is no height or weight on file to calculate BMI.  General:  Well nourished, well developed, in no acute distress HEENT: normal Neck: no JVD Vascular: No carotid bruits; Distal pulses 2+ bilaterally Cardiac:  normal S1, S2; RRR; no murmur  Lungs:  clear to auscultation bilaterally, no wheezing, rhonchi or rales  Abd: soft, nontender, no hepatomegaly  Ext: no edema Musculoskeletal:  No deformities, BUE and BLE strength normal and equal Skin: warm and dry  Neuro:  CNs 2-12 intact, no focal abnormalities noted Psych:  Normal affect   EKG:  The EKG was personally reviewed and demonstrates: Sinus rhythm with first-degree AV block.  No acute ischemic changes Telemetry:  Telemetry was personally reviewed and demonstrates:  sinus rhythm  Relevant CV Studies:  03/24/2017 TTE  Study Conclusions   - Left ventricle: The cavity size was normal. There was mild    concentric hypertrophy. Systolic function was normal. The    estimated ejection fraction was in the range of 60% to 65%. Wall    motion was normal; there were no regional wall motion    abnormalities. Doppler parameters are consistent with abnormal    left ventricular relaxation (grade 1 diastolic dysfunction).    Doppler parameters are consistent with indeterminate ventricular    filling pressure.  - Aortic valve: Transvalvular  velocity was within the normal range.    There was no stenosis. There was no regurgitation. Valve area    (VTI): 2.06 cm^2. Valve area (Vmax): 1.95 cm^2. Valve area    (Vmean): 1.99 cm^2.  - Mitral valve: Transvalvular velocity was within the normal range.    There was no evidence for stenosis. There was trivial    regurgitation.  - Right ventricle: The cavity size was normal. Wall thickness  was    normal. Systolic function was normal.  - Atrial septum: No defect or patent foramen ovale was identified.  - Tricuspid valve: There was trivial regurgitation.  - Pulmonary arteries: PA peak pressure: 16 mm Hg (S).   Laboratory Data:  High Sensitivity Troponin:   Recent Labs  Lab 06/29/22 1708 06/29/22 2056  TROPONINIHS 5 5     Chemistry Recent Labs  Lab 06/29/22 1708  NA 138  K 3.5  CL 111  CO2 15*  GLUCOSE 128*  BUN 15  CREATININE 0.64  CALCIUM 7.8*  GFRNONAA >60  ANIONGAP 12    Recent Labs  Lab 06/29/22 2350  PROT 6.4*  ALBUMIN 3.0*  AST 17  ALT 19  ALKPHOS 59  BILITOT 0.7   Lipids No results for input(s): "CHOL", "TRIG", "HDL", "LABVLDL", "LDLCALC", "CHOLHDL" in the last 168 hours.  Hematology Recent Labs  Lab 06/29/22 1708  WBC 6.8  RBC 3.56*  HGB 10.3*  HCT 32.8*  MCV 92.1  MCH 28.9  MCHC 31.4  RDW 13.2  PLT 165   Thyroid No results for input(s): "TSH", "FREET4" in the last 168 hours.  BNP Recent Labs  Lab 06/29/22 2350  BNP 99.9    DDimer  Recent Labs  Lab 06/29/22 2350  DDIMER 0.94*     Radiology/Studies:  CT Angio Chest PE W and/or Wo Contrast  Result Date: 06/30/2022 CLINICAL DATA:  Chest pain which is sharp and substernal radiating to the esophagus. Resolved with sub lingual nitro. EXAM: CT ANGIOGRAPHY CHEST WITH CONTRAST TECHNIQUE: Multidetector CT imaging of the chest was performed using the standard protocol during bolus administration of intravenous contrast. Multiplanar CT image reconstructions and MIPs were obtained to evaluate the vascular anatomy. RADIATION DOSE REDUCTION: This exam was performed according to the departmental dose-optimization program which includes automated exposure control, adjustment of the mA and/or kV according to patient size and/or use of iterative reconstruction technique. CONTRAST:  75mL OMNIPAQUE IOHEXOL 350 MG/ML SOLN COMPARISON:  Chest radiograph 06/29/2022 and CTA chest 03/01/2019 FINDINGS:  Cardiovascular: Satisfactory opacification of the pulmonary arteries to the segmental level. No evidence of pulmonary embolism. Normal heart size. No pericardial effusion. Coronary artery and aortic atherosclerotic calcification. Mediastinum/Nodes: No enlarged mediastinal, hilar, or axillary lymph nodes. Thyroid gland, trachea, and esophagus demonstrate no significant findings. Lungs/Pleura: Mosaic attenuation of the lungs compatible with air trapping. No focal consolidation, pleural effusion or pneumothorax. Upper Abdomen: No acute abnormality. Mildly nodular hepatic contour suggestive of cirrhosis. Unchanged left hepatic lobe cyst. Cholelithiasis. Musculoskeletal: No chest wall abnormality. No acute osseous findings. Review of the MIP images confirms the above findings. IMPRESSION: 1. No evidence of pulmonary embolism. 2. Mosaic attenuation of the lungs compatible with air trapping. 3. Advanced coronary artery atherosclerosis 4. Cirrhosis. Aortic Atherosclerosis (ICD10-I70.0). Electronically Signed   By: Minerva Fester M.D.   On: 06/30/2022 03:31   DG Chest 2 View  Result Date: 06/29/2022 CLINICAL DATA:  Chest pain EXAM: CHEST - 2 VIEW COMPARISON:  X-ray 10/28/2021 and older FINDINGS: No consolidation, pneumothorax or effusion. Normal  cardiopericardial silhouette without edema. Mild degenerative changes of the spine with some curvature. Aortic calcifications. IMPRESSION: No acute cardiopulmonary disease. Electronically Signed   By: Karen Kays M.D.   On: 06/29/2022 17:38     Assessment and Plan:   Chest pain, concerning for accelerating angina/unstable angina Exertional dyspnea  Patient with increasing frequency/severity of shortness of breath and chest pain occurring in the setting of tachycardia.  Troponin this admission negative/flat.  Chest CTA negative for PE but did notice diffuse and significant coronary artery calcification.  ECG without acute ischemic changes.  Patient's calcifications as  noted on CT along with multiple risk factors certainly concerning for CAD despite negative troponin. Symptoms consistent with accelerating angina. It is possible that patient may be having transient demand ischemia in the setting of tachycardic episodes with underlying CAD (no telemetry supporting tachy-arrhythmia this admission).  Unfortunately her calcium burden and coronary arteries is significant enough to preclude use of cardiac CTA to define coronary anatomy.  Given that it appears to be three-vessel calcification, I also feel that stress Myoview is likely to be falsely negative with possible balanced ischemia.  Left heart catheterization would be the best means of defining patient's coronary anatomy. Patient will also need an updated echocardiogram.  This has been ordered.  Will discuss timing of left heart cath further with Dr. Shari Prows.  Could be done later this afternoon as patient has been n.p.o. or tomorrow. Patient already on ASA/Plavix s/p CVA.  Tachycardia  Patient with multi-year hx palpitations and sensation of tachycardia. Prior Zio monitoring has been non-diagnostic for tachy-arrhythmia or atrial fibrillation.   Continue Metoprolol for rate control. Patient has been taking 75mg  AM, 100mg  PM. Given lack of tachycardia on telemetry even with this held, would not up-titrate dose at this time Consider repeating outpatient heart monitor.  Hypertension  BP slightly elevated today. Continue home Amlodipine, Lisinopril 20mg   Hyperlipidemia  Patient without recent lipid panel per my review of labs. Will check lipid panel and lipoprotein A tomorrow. Continue statin.  Risk Assessment/Risk Scores:     TIMI Risk Score for Unstable Angina or Non-ST Elevation MI:   The patient's TIMI risk score is  , which indicates a  % risk of all cause mortality, new or recurrent myocardial infarction or need for urgent revascularization in the next 14 days.          For questions or updates,  please contact [facility] Please consult [email or web address] for contact info under    Signed, Perlie Gold, PA-C  06/30/2022 10:49 AM  Patient seen and examined and agree with Perlie Gold, PA-C as detailed above.  In brief, the patient is a 82 year old female with history of HTN, HLD, fibromyalgia, DMII, vascular dementia (mild) and prior CVA who presented to the ER with chest pain, palpitations and SOB for which Cardiology was consulted.  Patient states that she has been suffering from "tachycardia" for several years and has had frequent palpitations. Over the past two weeks, however, her symptoms have worsened where she is now having episodes of palpitations with associated chest pain radiating to her jaw as well as SOB. Symptoms can occur with exertion or at rest but are worse with exertion. Given progression of symptoms, she decided to come to the ER for further evaluation.  In the ER, trop negative and ECG non-ischemic. CTA PE protocol negative for PE but with very extensive coronary artery calcification. Given symptoms and degree of coronary artery calcification as well as risk factors,  will proceed with LHC for further evaluation. Not a coronary CTA candidate due to severity of Ca. This was discussed extensively with the patient and her two sons at bedside.  GEN: No acute distress.   Neck: No JVD Cardiac: RRR, no murmurs, rubs, or gallops.  Respiratory: Clear to auscultation bilaterally. GI: Soft, nontender, non-distended  MS: 2+ LLE edema, trace RLE edema. Warm. Neuro:  Nonfocal  Psych: Normal affect    Plan: -Plan for LHC today -Follow-up TTE -Continue ASA 81mg  daily, plavix 75mg  daily (on DAPT for history of CVA) -Continue lipitor 80mg  daily, zetia 10mg  daily -Resume metop at 50mg  BID -Resume ACE post cath -Plan discussed extensively with the patient and her sons who are amenable to proceed  INFORMED CONSENT: I have reviewed the risks, indications, and alternatives  to cardiac catheterization, possible angioplasty, and stenting with the patient. Risks include but are not limited to bleeding, infection, vascular injury, stroke, myocardial infection, arrhythmia, kidney injury, radiation-related injury in the case of prolonged fluoroscopy use, emergency cardiac surgery, and death. The patient understands the risks of serious complication is 1-2 in 1000 with diagnostic cardiac cath and 1-2% or less with angioplasty/stenting.    Laurance Flatten, MD

## 2022-06-30 NOTE — TOC Initial Note (Signed)
Transition of Care [facility]) - Initial/Assessment Note   Patient Details  Name: Jill Howard MRN: [id number] Date of Birth: [birth year]  Transition of Care [facility]) CM/SW Contact:    Leone Haven, RN Phone Number: 06/30/2022, 3:22 PM  Clinical Narrative:                 From home with family she has PCP, Dr. Rosalia Hammers with [facility],  she has insurance on file, she does not have any HH in place at this time or any DME that she uses.  Family will transport her home at dc, her spouse , Rosanne Ashing or son Luisa Hart are support system, she gets meds from [facility] in [place].   Expected Discharge Plan: Home/Self Care Barriers to Discharge: Continued Medical Work up   Patient Goals and CMS Choice Patient states their goals for this hospitalization and ongoing recovery are:: return home   Choice offered to / list presented to : NA      Expected Discharge Plan and Services In-house Referral: NA Discharge Planning Services: CM Consult Post Acute Care Choice: NA Living arrangements for the past 2 months: Single Family Home                 DME Arranged: N/A DME Agency: NA       HH Arranged: NA          Prior Living Arrangements/Services Living arrangements for the past 2 months: Single Family Home Lives with:: Adult Children, Spouse Patient language and need for interpreter reviewed:: Yes Do you feel safe going back to the place where you live?: Yes      Need for Family Participation in Patient Care: Yes (Comment) Care giver support system in place?: Yes (comment)   Criminal Activity/Legal Involvement Pertinent to Current Situation/Hospitalization: No - Comment as needed  Activities of Daily Living      Permission Sought/Granted                  Emotional Assessment Appearance:: Appears stated age Attitude/Demeanor/Rapport: Engaged Affect (typically observed): Appropriate Orientation: : Oriented to Self, Oriented to Place, Oriented to  Time,  Oriented to Situation Alcohol / Substance Use: Not Applicable Psych Involvement: No (comment)  Admission diagnosis:  Exertional chest pain [R07.9] Chest pain [R07.9] Patient Active Problem List   Diagnosis Date Noted   Chest pain 06/30/2022   Left leg swelling 06/30/2022   Obesity (BMI 30-39.9) 06/30/2022   Non-anion gap metabolic acidosis 06/30/2022   Normocytic anemia 06/30/2022   Palpitations 07/11/2018   TIA (transient ischemic attack) 06/13/2017   Intractable headache    Hyperlipidemia    History of stroke    Neurological symptoms 03/23/2017   CVA (cerebral vascular accident) (HCC) 10/25/2016   Cerebrovascular disease 10/24/2016   Essential hypertension 10/24/2016   Diabetes mellitus with complication (HCC) 10/24/2016   Chronic pain 10/24/2016   PCP:  Trenda Moots, PA-C Pharmacy:   [facility] - [place], [place] - [address] [place] [address] Phone: [phone] Fax: [phone]  [facility] - [place], [place] - [address] [place] [address] Phone: [phone] Fax: [phone]     Social Determinants of Health (SDOH) Social History: SDOH Screenings   Tobacco Use: Medium Risk (06/29/2022)   SDOH Interventions:     Readmission Risk Interventions     No data to display

## 2022-07-01 ENCOUNTER — Encounter (HOSPITAL_COMMUNITY): Payer: Self-pay | Admitting: Cardiovascular Disease

## 2022-07-01 DIAGNOSIS — I25700 Atherosclerosis of coronary artery bypass graft(s), unspecified, with unstable angina pectoris: Secondary | ICD-10-CM

## 2022-07-01 DIAGNOSIS — I10 Essential (primary) hypertension: Secondary | ICD-10-CM | POA: Diagnosis not present

## 2022-07-01 DIAGNOSIS — E7800 Pure hypercholesterolemia, unspecified: Secondary | ICD-10-CM | POA: Diagnosis not present

## 2022-07-01 DIAGNOSIS — R079 Chest pain, unspecified: Secondary | ICD-10-CM | POA: Diagnosis not present

## 2022-07-01 DIAGNOSIS — I2510 Atherosclerotic heart disease of native coronary artery without angina pectoris: Secondary | ICD-10-CM | POA: Diagnosis not present

## 2022-07-01 LAB — BASIC METABOLIC PANEL
Anion gap: 8 (ref 5–15)
BUN: 14 mg/dL (ref 8–23)
CO2: 22 mmol/L (ref 22–32)
Calcium: 8.9 mg/dL (ref 8.9–10.3)
Chloride: 107 mmol/L (ref 98–111)
Creatinine, Ser: 0.77 mg/dL (ref 0.44–1.00)
GFR, Estimated: >60 mL/min (ref >60)
Glucose, Bld: 160 mg/dL — ABNORMAL HIGH (ref 70–99)
Potassium: 3.7 mmol/L (ref 3.5–5.1)
Sodium: 137 mmol/L (ref 135–145)

## 2022-07-01 LAB — GLUCOSE, CAPILLARY
Glucose-Capillary: 162 mg/dL — ABNORMAL HIGH (ref 70–99)
Glucose-Capillary: 187 mg/dL — ABNORMAL HIGH (ref 70–99)
Glucose-Capillary: 345 mg/dL — ABNORMAL HIGH (ref 70–99)
Glucose-Capillary: 376 mg/dL — ABNORMAL HIGH (ref 70–99)
Glucose-Capillary: 299 mg/dL — ABNORMAL HIGH (ref 70–99)
Glucose-Capillary: 295 mg/dL — ABNORMAL HIGH (ref 70–99)

## 2022-07-01 LAB — CBC
HCT: 34.2 % — ABNORMAL LOW (ref 36.0–46.0)
Hemoglobin: 11.2 g/dL — ABNORMAL LOW (ref 12.0–15.0)
MCH: 29.1 pg (ref 26.0–34.0)
MCHC: 32.7 g/dL (ref 30.0–36.0)
MCV: 88.8 fL (ref 80.0–100.0)
Platelets: 172 10*3/uL (ref 150–400)
RBC: 3.85 MIL/uL — ABNORMAL LOW (ref 3.87–5.11)
RDW: 13.5 % (ref 11.5–15.5)
WBC: 6.6 10*3/uL (ref 4.0–10.5)
nRBC: 0 % (ref 0.0–0.2)

## 2022-07-01 MED ORDER — INSULIN GLARGINE-YFGN 100 UNIT/ML ~~LOC~~ SOLN
25.0000 [IU] | Freq: Every day | SUBCUTANEOUS | Status: DC
  Administered 2022-07-01: 25 [IU] via SUBCUTANEOUS
  Filled 2022-07-01 (×2): qty 0.25

## 2022-07-01 MED ORDER — LISINOPRIL 20 MG PO TABS
20.0000 mg | ORAL_TABLET | Freq: Every day | ORAL | Status: DC
  Administered 2022-07-01 – 2022-07-02 (×2): 20 mg via ORAL
  Filled 2022-07-01 (×2): qty 1

## 2022-07-01 MED ORDER — ISOSORBIDE MONONITRATE ER 30 MG PO TB24: 30.0000 mg | ORAL_TABLET | Freq: Every day | ORAL | 0 refills | Status: DC

## 2022-07-01 MED ORDER — INSULIN ASPART 100 UNIT/ML IJ SOLN
3.0000 [IU] | Freq: Three times a day (TID) | INTRAMUSCULAR | Status: DC
  Administered 2022-07-01: 3 [IU] via SUBCUTANEOUS

## 2022-07-01 MED ORDER — ASPIRIN 81 MG PO TBEC: 81.0000 mg | DELAYED_RELEASE_TABLET | Freq: Every day | ORAL | 1 refills | Status: AC

## 2022-07-01 MED ORDER — VITAMIN D 50 MCG (2000 UT) PO TABS: 2000.0000 [IU] | ORAL_TABLET | Freq: Every day | ORAL | Status: AC

## 2022-07-01 MED ORDER — METOPROLOL SUCCINATE ER 100 MG PO TB24: 100.0000 mg | ORAL_TABLET | Freq: Every day | ORAL | 0 refills | Status: DC

## 2022-07-01 MED ORDER — METOPROLOL SUCCINATE ER 100 MG PO TB24
100.0000 mg | ORAL_TABLET | Freq: Every day | ORAL | Status: DC
  Administered 2022-07-01 – 2022-07-02 (×2): 100 mg via ORAL
  Filled 2022-07-01 (×2): qty 1

## 2022-07-01 NOTE — Heart Team MDD (Signed)
   Heart Team Multi-Disciplinary Discussion  Patient: Jill Howard  DOB: 1940-02-05  MRN: [id number]   Date: 07/01/2022  8:57 AM    Attendees: Interventional Cardiology: Nanetta Batty, MD Lorine Bears, MD Bryan Lemma, MD Yates Decamp, MD Truett Mainland, MD Peter Sw[place], MD Yvonne Kendall, MD Everette Rank, MD Tonny Bollman, MD  Cardiothoracic Surgery: Brynda Greathouse, MD     Patient History: 82 y.o. female with approximately 1 week substernal chest pain and shortness of breath worse with exertion of any kind, progressing with radiation into jaw.   Risk Factors: Hypertension Hyperlipidemia Diabetes Mellitus History of Stroke or TIA Additional Risk Factors: Vascular dementia, fibromyalgia     Review of Prior Angiography and PCI Procedures: The left heart cath and coronary angiography from 06/30/2022 was reviewed and discussed in detail including severe mid LAD stenosis with severe diffuse calcification throughout the mid vessel and stenosis up to 90 to 95%. Subtotal occlusion of the left circumflex with severely diseased obtuse marginal branches and moderately severe distal RCA stenosis with severe stenosis at the ostium of the posterior AV branch.     Discussion: After presentation, consideration of treatment options occurred including contrasting aggressive medical therapy and antianginal medications versus intravascular lithotripsy of LAD. Consensus from the team was for maximization of medical therapy with antianginal medications and treatment and if symptoms continue despite medical therapy to attempt intravascular lithotripsy of LAD.    Recommendations: Medical Therapy      Alison Murray, RN  07/01/2022 8:57 AM

## 2022-07-01 NOTE — Care Plan (Addendum)
Initial plan was to discharge today to home.  Patient ambulated well, she remained hemodynamically stable.  Discharge orders and summary placed.  Patient and family wanted to be monitored 1 more day.   She can be discharged tomorrow a.m. if she remains comfortable, hemodynamically stable. No change in the medical management.  Med rec/DC summary on place

## 2022-07-01 NOTE — Care Management Obs Status (Signed)
MEDICARE OBSERVATION STATUS NOTIFICATION   Patient Details  Name: Jill Howard MRN: [id number] Date of Birth: 1940-07-21   Medicare Observation Status Notification Given:  Yes    Leone Haven, RN 07/01/2022, 11:54 AM

## 2022-07-01 NOTE — Progress Notes (Signed)
TR Band removed.  Site unremarkable.  Site clean and dry.  Dry gauze and tegaderm placed on site.

## 2022-07-01 NOTE — Discharge Summary (Signed)
Physician Discharge Summary  Jill Howard:[id number] DOB: 1940/04/12 DOA: 06/29/2022  PCP: Trenda Moots, PA-C  Admit date: 06/29/2022 Discharge date: 07/01/2022  Admitted From: Home Disposition:  Home  Discharge Condition:Stable CODE STATUS:FULL Diet recommendation: Heart Healthy   Brief/Interim Summary: is a 82 y.o. 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 CTA chest showed no PE but heavy calcification of coronary arteries. Given aspirin and Cardiology consulted for risk stratification.  She underwent left heart cath with finding of multivessel disease, severe diffuse coronary calcification.  Cardiology recommended medical management.  She remains chest pain-free this morning.  Ambulated well on the hallway and maintained her vitals.  Cardiology cleared for discharge today.  Following problems were addressed during the hospitalization:  Chest pain/multivessel coronary artery disease Management as above  Acute on chronic left leg swelling Doppler ultrasound negative for DVT  History of SVT Zio patch in 2020 showed extremely brief episodes of SVT.  She is on metoprolol for this and follows with Dr. Marina Goodell. Continue metoprolol   Abnormal radiography of the liver, possible cirrhosis She has normal synthetic function of the liver on LFTs, but radiographic appearance of cirrhosis - Follow-up with PCP   Hypertension Blood pressure slightly elevated Continue current medications  Type 2 diabetes Resume home regimen, monitor blood sugars at home  Obesity BMI 31   Hyperlipidemia Cerebrovascular disease Continue current medications   Normocytic anemia Old iron studies show some evidence of iron deficiency for which she  is being treated by her PCP.  Hemoglobin is slightly lower than baseline, but there is no clinical bleeding - Follow-up with PCP, repeat CBC in 2 to 4 weeks   Discharge Diagnoses:  Principal Problem:   Chest pain Active Problems:   Left leg swelling   Cerebrovascular disease   Essential hypertension   Diabetes mellitus with complication (HCC)   Hyperlipidemia   Obesity (BMI 30-39.9)   Non-anion gap metabolic acidosis   Normocytic anemia    Discharge Instructions  Discharge Instructions     Diet - low sodium heart healthy   Complete by: As directed    Discharge instructions   Complete by: As directed    1)Please take prescribed medications as instructed 2)Follow up with your PCP in a week. 3)Follow up with cardiology as an outpatient.  You will be called for appointment 4)You are on insulin at home.Monitor your blood sugars.  Follow-up with her PCP closely for the management of your diabetes.   Increase activity slowly   Complete by: As directed       Allergies as of 07/01/2022       Reactions   Influenza Vaccines Swelling   Arm was swollen in 2002    Sitagliptin Swelling   Guaifenesin Other (See Comments)   Unknown   Hydroxychloroquine Other (See Comments)   Eye pain   Latex Other (See Comments)   Unknown   Nitrofurantoin Nausea Only   Other Other (See Comments)   Band-Aid   Phenylephrine-pheniramine-dm Other (See Comments)   Unknown   Ultram [tramadol Hcl] Other (See Comments)   hallucinations   Escitalopram Oxalate Other (See Comments)   Hallucinations        Medication List     STOP taking these medications    ALPRAZolam  0.5 MG tablet Commonly known as: XANAX   aspirin 325 MG tablet Replaced by: aspirin EC 81 MG tablet   metoprolol tartrate 25 MG tablet Commonly known as: LOPRESSOR   metoprolol tartrate 50 MG tablet Commonly known as: LOPRESSOR       TAKE these medications    acetaminophen 500 MG tablet Commonly known as:  TYLENOL Take 500-2,000 mg by mouth every 6 (six) hours as needed for moderate pain (Pain).   amLODipine 10 MG tablet Commonly known as: NORVASC TAKE ONE (1) TABLET BY MOUTH EVERY DAY What changed:  how much to take when to take this   aspirin EC 81 MG tablet Take 1 tablet (81 mg total) by mouth daily. Swallow whole. Start taking on: July 02, 2022 Replaces: aspirin 325 MG tablet   atorvastatin 80 MG tablet Commonly known as: LIPITOR Take 1 tablet (80 mg total) by mouth at bedtime.   cetirizine 10 MG tablet Commonly known as: ZYRTEC Take 10 mg by mouth at bedtime as needed for allergies or rhinitis.   CLARITIN PO Take 1 tablet by mouth daily.   clopidogrel 75 MG tablet Commonly known as: PLAVIX Take 1 tablet (75 mg total) by mouth daily.   ezetimibe 10 MG tablet Commonly known as: ZETIA Take 1 tablet (10 mg total) by mouth daily. What changed: when to take this   Fish Oil 1000 MG Caps Take 1,000 mg by mouth daily.   glimepiride 4 MG tablet Commonly known as: AMARYL Take 4 mg by mouth 2 (two) times daily.   HumaLOG KwikPen 100 UNIT/ML KwikPen Generic drug: insulin lispro Inject 0-22 Units into the skin 4 (four) times daily.   isosorbide mononitrate 30 MG 24 hr tablet Commonly known as: IMDUR Take 1 tablet (30 mg total) by mouth daily. Start taking on: July 02, 2022   lisinopril 20 MG tablet Commonly known as: ZESTRIL Take 20 mg by mouth daily.   Magnesium 250 MG Tabs Take 250 mg by mouth daily.   metFORMIN 1000 MG tablet Commonly known as: GLUCOPHAGE Take 1,000 mg by mouth 2 (two) times daily with a meal.   metoprolol succinate 100 MG 24 hr tablet Commonly known as: TOPROL-XL Take 1 tablet (100 mg total) by mouth daily. Take with or immediately following a meal. Start taking on: July 02, 2022   montelukast 10 MG tablet Commonly known as: SINGULAIR Take 10 mg by mouth at bedtime.   pantoprazole 40 MG tablet Commonly known as: PROTONIX Take 40 mg by  mouth daily as needed (Acid reflux).   TOUJEO SOLOSTAR [place] Inject 56 Units into the skin at bedtime.   VITAMIN B-12 PO Take 1 tablet by mouth daily.   Vitamin D 50 MCG (2000 UT) tablet Take 1 tablet (2,000 Units total) by mouth daily. What changed:  medication strength how much to take        Follow-up Information     Trenda Moots, PA-C Follow up.   Specialty: Physician Assistant Why: The office will call patient. Contact information: [address] [place] [address] [phone]                Allergies  Allergen Reactions   Influenza Vaccines Swelling    Arm was swollen in 2002    Sitagliptin Swelling   Guaifenesin Other (See Comments)    Unknown   Hydroxychloroquine Other (See Comments)    Eye pain   Latex Other (See Comments)    Unknown   Nitrofurantoin Nausea  Only   Other Other (See Comments)    Band-Aid   Phenylephrine-Pheniramine-Dm Other (See Comments)    Unknown   Ultram [Tramadol Hcl] Other (See Comments)    hallucinations   Escitalopram Oxalate Other (See Comments)    Hallucinations    Consultations: Cardiology   Procedures/Studies: CARDIAC CATHETERIZATION  Result Date: 06/30/2022   Dist RCA lesion is 70% stenosed.   RPAV lesion is 90% stenosed.   Dist LM lesion is 50% stenosed.   Prox Cx lesion is 99% stenosed.   Mid Cx to Dist Cx lesion is 99% stenosed with 99% stenosed side branch in 2nd Mrg.   Mid LAD to Dist LAD lesion is 95% stenosed.   1st Diag lesion is 50% stenosed.   Mid LAD lesion is 80% stenosed.   LV end diastolic pressure is normal. 1.  Mild to moderate distal left main stenosis of 50% 2.  Severe mid LAD stenosis with severe diffuse calcification throughout the mid vessel and stenosis up to 90 to 95% 3.  Subtotal occlusion of the left circumflex with severely diseased obtuse marginal branches 4.  Moderately severe distal RCA stenosis with severe stenosis at the ostium of the posterior AV branch Recommendations: The  patient has very severe diffuse coronary calcification with severe multivessel CAD.  I think she has poor targets for grafting and would initially try aggressive medical therapy and antianginal treatment.  The mid to distal LAD appear to be 2 mm in diameter or less and I do not think that branch is suitable for atherectomy and/or PCI.   VAS [place] LOWER EXTREMITY VENOUS (DVT)  Result Date: 06/30/2022  Lower Venous DVT Study Patient Name:  TENNILE ESCANDON  Date of Exam:   06/30/2022 Medical Rec #: [id number]     Accession #:    [id number] Date of Birth: July 21, 1940    Patient Gender: F Patient Age:   55 years Exam Location:  [facility] Procedure:      VAS [place] LOWER EXTREMITY VENOUS (DVT) Referring Phys: Joen Laura --------------------------------------------------------------------------------  Indications: Left lower extremity swelling.  Comparison Study: No prior studies. Performing Technologist: Jean Rosenthal RDMS, RVT  Examination Guidelines: A complete evaluation includes B-mode imaging, spectral Doppler, color Doppler, and power Doppler as needed of all accessible portions of each vessel. Bilateral testing is considered an integral part of a complete examination. Limited examinations for reoccurring indications may be performed as noted. The reflux portion of the exam is performed with the patient in reverse Trendelenburg.  +-----+---------------+---------+-----------+----------+--------------+ RIGHTCompressibilityPhasicitySpontaneityPropertiesThrombus Aging +-----+---------------+---------+-----------+----------+--------------+ CFV  Full           Yes      Yes                                 +-----+---------------+---------+-----------+----------+--------------+   +---------+---------------+---------+-----------+----------+--------------+ LEFT     CompressibilityPhasicitySpontaneityPropertiesThrombus Aging +---------+---------------+---------+-----------+----------+--------------+  CFV      Full           Yes      Yes                                 +---------+---------------+---------+-----------+----------+--------------+ SFJ      Full                                                        +---------+---------------+---------+-----------+----------+--------------+  FV Prox  Full                                                        +---------+---------------+---------+-----------+----------+--------------+ FV Mid   Full                                                        +---------+---------------+---------+-----------+----------+--------------+ FV DistalFull                                                        +---------+---------------+---------+-----------+----------+--------------+ PFV      Full                                                        +---------+---------------+---------+-----------+----------+--------------+ POP      Full           Yes      Yes                                 +---------+---------------+---------+-----------+----------+--------------+ PTV      Full                                                        +---------+---------------+---------+-----------+----------+--------------+ PERO     Full                                                        +---------+---------------+---------+-----------+----------+--------------+ Gastroc  Full                                                        +---------+---------------+---------+-----------+----------+--------------+     Summary: RIGHT: - No evidence of common femoral vein obstruction.  LEFT: - There is no evidence of deep vein thrombosis in the lower extremity.  - No cystic structure found in the popliteal fossa.  *See table(s) above for measurements and observations. Electronically signed by Sherald Hess MD on 06/30/2022 at 3:09:36 PM.    Final    ECHOCARDIOGRAM COMPLETE  Result Date: 06/30/2022    ECHOCARDIOGRAM REPORT   Patient  Name:   Jill Howard Date of Exam: 06/30/2022 Medical Rec #:  [id number]    Height:       63.6 in Accession #:    [id number]   Weight:  177.0 lb Date of Birth:  01-13-1941   BSA:          1.848 m Patient Age:    66 years     BP:           137/61 mmHg Patient Gender: F            HR:           74 bpm. Exam Location:  Inpatient Procedure: 2D Echo, Cardiac Doppler and Color Doppler Indications:    Chest Pain  History:        Patient has no prior history of Echocardiogram examinations and                 Patient has prior history of Echocardiogram examinations, most                 recent 10/25/2016. Risk Factors:Hypertension, Diabetes and                 Dyslipidemia.  Sonographer:    Trellis Moment RDCS (AE, PE) Referring Phys: [id number] Perlie Gold IMPRESSIONS  1. Left ventricular ejection fraction, by estimation, is 60 to 65%. The left ventricle has normal function. The left ventricle has no regional wall motion abnormalities. Left ventricular diastolic parameters are consistent with Grade I diastolic dysfunction (impaired relaxation).  2. Right ventricular systolic function is normal. The right ventricular size is normal.  3. Left atrial size was mildly dilated.  4. The mitral valve is abnormal. Trivial mitral valve regurgitation. No evidence of mitral stenosis.  5. The aortic valve is tricuspid. There is mild calcification of the aortic valve. There is mild thickening of the aortic valve. Aortic valve regurgitation is not visualized. Aortic valve sclerosis is present, with no evidence of aortic valve stenosis.  6. The inferior vena cava is normal in size with greater than 50% respiratory variability, suggesting right atrial pressure of 3 mmHg. FINDINGS  Left Ventricle: Left ventricular ejection fraction, by estimation, is 60 to 65%. The left ventricle has normal function. The left ventricle has no regional wall motion abnormalities. The left ventricular internal cavity size was normal in size. There is  no left  ventricular hypertrophy. Left ventricular diastolic parameters are consistent with Grade I diastolic dysfunction (impaired relaxation). Right Ventricle: The right ventricular size is normal. No increase in right ventricular wall thickness. Right ventricular systolic function is normal. Left Atrium: Left atrial size was mildly dilated. Right Atrium: Right atrial size was normal in size. Pericardium: There is no evidence of pericardial effusion. Mitral Valve: The mitral valve is abnormal. There is mild thickening of the mitral valve leaflet(s). There is moderate calcification of the mitral valve leaflet(s). Trivial mitral valve regurgitation. No evidence of mitral valve stenosis. Tricuspid Valve: The tricuspid valve is normal in structure. Tricuspid valve regurgitation is mild . No evidence of tricuspid stenosis. Aortic Valve: The aortic valve is tricuspid. There is mild calcification of the aortic valve. There is mild thickening of the aortic valve. Aortic valve regurgitation is not visualized. Aortic valve sclerosis is present, with no evidence of aortic valve stenosis. Pulmonic Valve: The pulmonic valve was normal in structure. Pulmonic valve regurgitation is not visualized. No evidence of pulmonic stenosis. Aorta: The aortic root is normal in size and structure. Venous: The inferior vena cava is normal in size with greater than 50% respiratory variability, suggesting right atrial pressure of 3 mmHg. IAS/Shunts: The interatrial septum appears to be lipomatous. No atrial level shunt detected by color flow Doppler.  LEFT VENTRICLE PLAX 2D LVIDd:         4.60 cm   Diastology LVIDs:         3.20 cm   LV e' medial:    5.22 cm/s LV PW:         1.00 cm   LV E/e' medial:  15.3 LV IVS:        1.00 cm   LV e' lateral:   8.27 cm/s LVOT diam:     1.90 cm   LV E/e' lateral: 9.6 LV SV:         64 LV SV Index:   35 LVOT Area:     2.84 cm  RIGHT VENTRICLE RV S prime:     15.40 cm/s TAPSE (M-mode): 2.5 cm LEFT ATRIUM            Index        RIGHT ATRIUM           Index LA diam:      4.10 cm 2.22 cm/m   RA Area:     20.40 cm LA Vol (A2C): 25.9 ml 14.01 ml/m  RA Volume:   56.70 ml  30.68 ml/m LA Vol (A4C): 53.1 ml 28.73 ml/m  AORTIC VALVE LVOT Vmax:   98.60 cm/s LVOT Vmean:  72.500 cm/s LVOT VTI:    0.227 m  AORTA Ao Root diam: 3.20 cm Ao Asc diam:  3.10 cm MITRAL VALVE                TRICUSPID VALVE MV Area (PHT): 4.00 cm     TR Peak grad:   17.6 mmHg MV E velocity: 79.70 cm/s   TR Vmax:        210.00 cm/s MV A velocity: 108.00 cm/s MV E/A ratio:  0.74         SHUNTS                             Systemic VTI:  0.23 m                             Systemic Diam: 1.90 cm Charlton Haws MD Electronically signed by Charlton Haws MD Signature Date/Time: 06/30/2022/2:13:54 PM    Final    CT Angio Chest PE W and/or Wo Contrast  Result Date: 06/30/2022 CLINICAL DATA:  Chest pain which is sharp and substernal radiating to the esophagus. Resolved with sub lingual nitro. EXAM: CT ANGIOGRAPHY CHEST WITH CONTRAST TECHNIQUE: Multidetector CT imaging of the chest was performed using the standard protocol during bolus administration of intravenous contrast. Multiplanar CT image reconstructions and MIPs were obtained to evaluate the vascular anatomy. RADIATION DOSE REDUCTION: This exam was performed according to the departmental dose-optimization program which includes automated exposure control, adjustment of the mA and/or kV according to patient size and/or use of iterative reconstruction technique. CONTRAST:  75mL OMNIPAQUE IOHEXOL 350 MG/ML SOLN COMPARISON:  Chest radiograph 06/29/2022 and CTA chest 03/01/2019 FINDINGS: Cardiovascular: Satisfactory opacification of the pulmonary arteries to the segmental level. No evidence of pulmonary embolism. Normal heart size. No pericardial effusion. Coronary artery and aortic atherosclerotic calcification. Mediastinum/Nodes: No enlarged mediastinal, hilar, or axillary lymph nodes. Thyroid gland, trachea, and  esophagus demonstrate no significant findings. Lungs/Pleura: Mosaic attenuation of the lungs compatible with air trapping. No focal consolidation, pleural effusion or pneumothorax. Upper Abdomen: No acute abnormality. Mildly nodular hepatic contour suggestive of cirrhosis.  Unchanged left hepatic lobe cyst. Cholelithiasis. Musculoskeletal: No chest wall abnormality. No acute osseous findings. Review of the MIP images confirms the above findings. IMPRESSION: 1. No evidence of pulmonary embolism. 2. Mosaic attenuation of the lungs compatible with air trapping. 3. Advanced coronary artery atherosclerosis 4. Cirrhosis. Aortic Atherosclerosis (ICD10-I70.0). Electronically Signed   By: Minerva Fester M.D.   On: 06/30/2022 03:31   DG Chest 2 View  Result Date: 06/29/2022 CLINICAL DATA:  Chest pain EXAM: CHEST - 2 VIEW COMPARISON:  X-ray 10/28/2021 and older FINDINGS: No consolidation, pneumothorax or effusion. Normal cardiopericardial silhouette without edema. Mild degenerative changes of the spine with some curvature. Aortic calcifications. IMPRESSION: No acute cardiopulmonary disease. Electronically Signed   By: Karen Kays M.D.   On: 06/29/2022 17:38      Subjective: Patient seen and examined at bedside today.  Hemodynamically stable.  She ambulated on the hallway and did very well.  Had some chest burning after ambulation but vitals remained stable.  Discharge plan discussed with family and patient at the bedside.  Discharge Exam: Vitals:   07/01/22 0744 07/01/22 0953  BP: (!) 143/52 (!) 141/75  Pulse: 63 75  Resp: 18 18  Temp: 98.4 F (36.9 C)   SpO2: 94% 98%   Vitals:   06/30/22 2355 07/01/22 0431 07/01/22 0744 07/01/22 0953  BP: (!) 123/53 (!) 143/57 (!) 143/52 (!) 141/75  Pulse: 65 65 63 75  Resp: 15 15 18 18   Temp: 98.1 F (36.7 C) 98.1 F (36.7 C) 98.4 F (36.9 C)   TempSrc: Oral Oral Oral   SpO2: 90% 96% 94% 98%  Weight:  79 kg      General: Pt is alert, awake, not in acute  distress Cardiovascular: RRR, S1/S2 +, no rubs, no gallops Respiratory: CTA bilaterally, no wheezing, no rhonchi Abdominal: Soft, NT, ND, bowel sounds + Extremities: no edema, no cyanosis    The results of significant diagnostics from this hospitalization (including imaging, microbiology, ancillary and laboratory) are listed below for reference.     Microbiology: No results found for this or any previous visit (from the past 240 hour(s)).   Labs: BNP (last 3 results) Recent Labs    06/29/22 2350  BNP 99.9   Basic Metabolic Panel: Recent Labs  Lab 06/29/22 1708 07/01/22 0131  NA 138 137  K 3.5 3.7  CL 111 107  CO2 15* 22  GLUCOSE 128* 160*  BUN 15 14  CREATININE 0.64 0.77  CALCIUM 7.8* 8.9   Liver Function Tests: Recent Labs  Lab 06/29/22 2350  AST 17  ALT 19  ALKPHOS 59  BILITOT 0.7  PROT 6.4*  ALBUMIN 3.0*   Recent Labs  Lab 06/29/22 2350  LIPASE 33   No results for input(s): "AMMONIA" in the last 168 hours. CBC: Recent Labs  Lab 06/29/22 1708 07/01/22 0131  WBC 6.8 6.6  HGB 10.3* 11.2*  HCT 32.8* 34.2*  MCV 92.1 88.8  PLT 165 172   Cardiac Enzymes: No results for input(s): "CKTOTAL", "CKMB", "CKMBINDEX", "TROPONINI" in the last 168 hours. BNP: Invalid input(s): "POCBNP" CBG: Recent Labs  Lab 06/30/22 1638 06/30/22 2008 06/30/22 2353 07/01/22 0535 07/01/22 0740  GLUCAP 88 155* 134* 162* 187*   D-Dimer Recent Labs    06/29/22 2350  DDIMER 0.94*   Hgb A1c Recent Labs    06/29/22 2350  HGBA1C 8.7*   Lipid Profile No results for input(s): "CHOL", "HDL", "LDLCALC", "TRIG", "CHOLHDL", "LDLDIRECT" in the last 72 hours. Thyroid function studies  No results for input(s): "TSH", "T4TOTAL", "T3FREE", "THYROIDAB" in the last 72 hours.  Invalid input(s): "FREET3" Anemia work up No results for input(s): "VITAMINB12", "FOLATE", "FERRITIN", "TIBC", "IRON", "RETICCTPCT" in the last 72 hours. Urinalysis    Component Value Date/Time    COLORURINE YELLOW 05/25/2020 1654   APPEARANCEUR CLEAR 05/25/2020 1654   LABSPEC 1.012 05/25/2020 1654   PHURINE 5.0 05/25/2020 1654   GLUCOSEU NEGATIVE 05/25/2020 1654   HGBUR NEGATIVE 05/25/2020 1654   BILIRUBINUR NEGATIVE 05/25/2020 1654   KETONESUR NEGATIVE 05/25/2020 1654   PROTEINUR 100 (A) 05/25/2020 1654   NITRITE NEGATIVE 05/25/2020 1654   LEUKOCYTESUR NEGATIVE 05/25/2020 1654   Sepsis Labs Recent Labs  Lab 06/29/22 1708 07/01/22 0131  WBC 6.8 6.6   Microbiology No results found for this or any previous visit (from the past 240 hour(s)).  Please note: You were cared for by a hospitalist during your hospital stay. Once you are discharged, your primary care physician will handle any further medical issues. Please note that NO REFILLS for any discharge medications will be authorized once you are discharged, as it is imperative that you return to your primary care physician (or establish a relationship with a primary care physician if you do not have one) for your post hospital discharge needs so that they can reassess your need for medications and monitor your lab values.    Time coordinating discharge: 40 minutes  SIGNED:   Burnadette Pop, MD  [facility] 07/01/2022, 11:09 AM Pager [phone]  If 7PM-7AM, please contact night-coverage [email or web address] Password [email or web address]

## 2022-07-01 NOTE — Progress Notes (Addendum)
Rounding Note    Patient Name: Jill Howard Date of Encounter: 07/01/2022  [facility] Cardiologist: Nanetta Batty, MD   Subjective   Patient resting comfortably in bed. Denies angina or dyspnea this morning at rest. She is anxious to get out of bed and ambulate to see if she still has significant exertional symptoms.  Inpatient Medications    Scheduled Meds:  amLODipine  10 mg Oral Daily   aspirin EC  81 mg Oral Daily   atorvastatin  80 mg Oral QHS   clopidogrel  75 mg Oral Daily   enoxaparin (LOVENOX) injection  40 mg Subcutaneous Q24H   ezetimibe  10 mg Oral Daily   insulin aspart  0-15 Units Subcutaneous TID WC   insulin aspart  0-5 Units Subcutaneous QHS   insulin glargine-yfgn  15 Units Subcutaneous QHS   isosorbide mononitrate  30 mg Oral Daily   metoprolol succinate  75 mg Oral Daily   sodium chloride flush  3 mL Intravenous Q12H   Continuous Infusions:  sodium chloride     PRN Meds: sodium chloride, acetaminophen **OR** acetaminophen, ondansetron (ZOFRAN) IV, sodium chloride flush   Vital Signs    Vitals:   06/30/22 1926 06/30/22 2355 07/01/22 0431 07/01/22 0744  BP: (!) 115/51 (!) 123/53 (!) 143/57 (!) 143/52  Pulse: 67 65 65 63  Resp: 13 15 15 18   Temp: 98.1 F (36.7 C) 98.1 F (36.7 C) 98.1 F (36.7 C) 98.4 F (36.9 C)  TempSrc: Oral Oral Oral Oral  SpO2: 93% 90% 96% 94%  Weight:   79 kg     Intake/Output Summary (Last 24 hours) at 07/01/2022 0859 Last data filed at 06/30/2022 1800 Gross per 24 hour  Intake 220 ml  Output --  Net 220 ml      07/01/2022    4:31 AM 06/30/2022   12:00 PM 06/30/2022   10:00 AM  Last 3 Weights  Weight (lbs) 174 lb 1.6 oz 177 lb 0.5 oz 177 lb 0.5 oz  Weight (kg) 78.971 kg 80.3 kg 80.3 kg      Telemetry    Sinus rhythm - Personally Reviewed  ECG    No new tracing - Personally Reviewed  Physical Exam   GEN: No acute distress.   Neck: No JVD Cardiac: RRR, no murmurs, rubs, or gallops.   Respiratory: Clear to auscultation bilaterally. GI: Soft, nontender, non-distended  MS: 2+ left lower extremity edema to ankle. Right lower extremity with trace to 1+ edema. Neuro:  Nonfocal  Psych: Normal affect   Labs    High Sensitivity Troponin:   Recent Labs  Lab 06/29/22 1708 06/29/22 2056  TROPONINIHS 5 5     Chemistry Recent Labs  Lab 06/29/22 1708 06/29/22 2350 07/01/22 0131  NA 138  --  137  K 3.5  --  3.7  CL 111  --  107  CO2 15*  --  22  GLUCOSE 128*  --  160*  BUN 15  --  14  CREATININE 0.64  --  0.77  CALCIUM 7.8*  --  8.9  PROT  --  6.4*  --   ALBUMIN  --  3.0*  --   AST  --  17  --   ALT  --  19  --   ALKPHOS  --  59  --   BILITOT  --  0.7  --   GFRNONAA >60  --  >60  ANIONGAP 12  --  8  Lipids No results for input(s): "CHOL", "TRIG", "HDL", "LABVLDL", "LDLCALC", "CHOLHDL" in the last 168 hours.  Hematology Recent Labs  Lab 06/29/22 1708 07/01/22 0131  WBC 6.8 6.6  RBC 3.56* 3.85*  HGB 10.3* 11.2*  HCT 32.8* 34.2*  MCV 92.1 88.8  MCH 28.9 29.1  MCHC 31.4 32.7  RDW 13.2 13.5  PLT 165 172   Thyroid No results for input(s): "TSH", "FREET4" in the last 168 hours.  BNP Recent Labs  Lab 06/29/22 2350  BNP 99.9    DDimer  Recent Labs  Lab 06/29/22 2350  DDIMER 0.94*     Radiology    CARDIAC CATHETERIZATION  Result Date: 06/30/2022   Dist RCA lesion is 70% stenosed.   RPAV lesion is 90% stenosed.   Dist LM lesion is 50% stenosed.   Prox Cx lesion is 99% stenosed.   Mid Cx to Dist Cx lesion is 99% stenosed with 99% stenosed side branch in 2nd Mrg.   Mid LAD to Dist LAD lesion is 95% stenosed.   1st Diag lesion is 50% stenosed.   Mid LAD lesion is 80% stenosed.   LV end diastolic pressure is normal. 1.  Mild to moderate distal left main stenosis of 50% 2.  Severe mid LAD stenosis with severe diffuse calcification throughout the mid vessel and stenosis up to 90 to 95% 3.  Subtotal occlusion of the left circumflex with severely diseased  obtuse marginal branches 4.  Moderately severe distal RCA stenosis with severe stenosis at the ostium of the posterior AV branch Recommendations: The patient has very severe diffuse coronary calcification with severe multivessel CAD.  I think she has poor targets for grafting and would initially try aggressive medical therapy and antianginal treatment.  The mid to distal LAD appear to be 2 mm in diameter or less and I do not think that branch is suitable for atherectomy and/or PCI.   VAS [place] LOWER EXTREMITY VENOUS (DVT)  Result Date: 06/30/2022  Lower Venous DVT Study Patient Name:  PASCHA PARMER  Date of Exam:   06/30/2022 Medical Rec #: [id number]     Accession #:    [id number] Date of Birth: 1940-03-06    Patient Gender: F Patient Age:   82 years Exam Location:  [facility] Procedure:      VAS [place] LOWER EXTREMITY VENOUS (DVT) Referring Phys: Joen Laura --------------------------------------------------------------------------------  Indications: Left lower extremity swelling.  Comparison Study: No prior studies. Performing Technologist: Jean Rosenthal RDMS, RVT  Examination Guidelines: A complete evaluation includes B-mode imaging, spectral Doppler, color Doppler, and power Doppler as needed of all accessible portions of each vessel. Bilateral testing is considered an integral part of a complete examination. Limited examinations for reoccurring indications may be performed as noted. The reflux portion of the exam is performed with the patient in reverse Trendelenburg.  +-----+---------------+---------+-----------+----------+--------------+ RIGHTCompressibilityPhasicitySpontaneityPropertiesThrombus Aging +-----+---------------+---------+-----------+----------+--------------+ CFV  Full           Yes      Yes                                 +-----+---------------+---------+-----------+----------+--------------+   +---------+---------------+---------+-----------+----------+--------------+  LEFT     CompressibilityPhasicitySpontaneityPropertiesThrombus Aging +---------+---------------+---------+-----------+----------+--------------+ CFV      Full           Yes      Yes                                 +---------+---------------+---------+-----------+----------+--------------+  SFJ      Full                                                        +---------+---------------+---------+-----------+----------+--------------+ FV Prox  Full                                                        +---------+---------------+---------+-----------+----------+--------------+ FV Mid   Full                                                        +---------+---------------+---------+-----------+----------+--------------+ FV DistalFull                                                        +---------+---------------+---------+-----------+----------+--------------+ PFV      Full                                                        +---------+---------------+---------+-----------+----------+--------------+ POP      Full           Yes      Yes                                 +---------+---------------+---------+-----------+----------+--------------+ PTV      Full                                                        +---------+---------------+---------+-----------+----------+--------------+ PERO     Full                                                        +---------+---------------+---------+-----------+----------+--------------+ Gastroc  Full                                                        +---------+---------------+---------+-----------+----------+--------------+     Summary: RIGHT: - No evidence of common femoral vein obstruction.  LEFT: - There is no evidence of deep vein thrombosis in the lower extremity.  - No cystic structure found in the popliteal fossa.  *See table(s) above for measurements and observations. Electronically  signed by Sherald Hess MD  on 06/30/2022 at 3:09:36 PM.    Final    ECHOCARDIOGRAM COMPLETE  Result Date: 06/30/2022    ECHOCARDIOGRAM REPORT   Patient Name:   SUMMER ROCCIA Date of Exam: 06/30/2022 Medical Rec #:  [id number]    Height:       63.6 in Accession #:    [id number]   Weight:       177.0 lb Date of Birth:  02-19-1940   BSA:          1.848 m Patient Age:    39 years     BP:           137/61 mmHg Patient Gender: F            HR:           74 bpm. Exam Location:  Inpatient Procedure: 2D Echo, Cardiac Doppler and Color Doppler Indications:    Chest Pain  History:        Patient has no prior history of Echocardiogram examinations and                 Patient has prior history of Echocardiogram examinations, most                 recent 10/25/2016. Risk Factors:Hypertension, Diabetes and                 Dyslipidemia.  Sonographer:    Trellis Moment RDCS (AE, PE) Referring Phys: [id number] Perlie Gold IMPRESSIONS  1. Left ventricular ejection fraction, by estimation, is 60 to 65%. The left ventricle has normal function. The left ventricle has no regional wall motion abnormalities. Left ventricular diastolic parameters are consistent with Grade I diastolic dysfunction (impaired relaxation).  2. Right ventricular systolic function is normal. The right ventricular size is normal.  3. Left atrial size was mildly dilated.  4. The mitral valve is abnormal. Trivial mitral valve regurgitation. No evidence of mitral stenosis.  5. The aortic valve is tricuspid. There is mild calcification of the aortic valve. There is mild thickening of the aortic valve. Aortic valve regurgitation is not visualized. Aortic valve sclerosis is present, with no evidence of aortic valve stenosis.  6. The inferior vena cava is normal in size with greater than 50% respiratory variability, suggesting right atrial pressure of 3 mmHg. FINDINGS  Left Ventricle: Left ventricular ejection fraction, by estimation, is 60 to 65%. The left ventricle has  normal function. The left ventricle has no regional wall motion abnormalities. The left ventricular internal cavity size was normal in size. There is  no left ventricular hypertrophy. Left ventricular diastolic parameters are consistent with Grade I diastolic dysfunction (impaired relaxation). Right Ventricle: The right ventricular size is normal. No increase in right ventricular wall thickness. Right ventricular systolic function is normal. Left Atrium: Left atrial size was mildly dilated. Right Atrium: Right atrial size was normal in size. Pericardium: There is no evidence of pericardial effusion. Mitral Valve: The mitral valve is abnormal. There is mild thickening of the mitral valve leaflet(s). There is moderate calcification of the mitral valve leaflet(s). Trivial mitral valve regurgitation. No evidence of mitral valve stenosis. Tricuspid Valve: The tricuspid valve is normal in structure. Tricuspid valve regurgitation is mild . No evidence of tricuspid stenosis. Aortic Valve: The aortic valve is tricuspid. There is mild calcification of the aortic valve. There is mild thickening of the aortic valve. Aortic valve regurgitation is not visualized. Aortic valve sclerosis is present, with no evidence of aortic valve stenosis. Pulmonic Valve:  The pulmonic valve was normal in structure. Pulmonic valve regurgitation is not visualized. No evidence of pulmonic stenosis. Aorta: The aortic root is normal in size and structure. Venous: The inferior vena cava is normal in size with greater than 50% respiratory variability, suggesting right atrial pressure of 3 mmHg. IAS/Shunts: The interatrial septum appears to be lipomatous. No atrial level shunt detected by color flow Doppler.  LEFT VENTRICLE PLAX 2D LVIDd:         4.60 cm   Diastology LVIDs:         3.20 cm   LV e' medial:    5.22 cm/s LV PW:         1.00 cm   LV E/e' medial:  15.3 LV IVS:        1.00 cm   LV e' lateral:   8.27 cm/s LVOT diam:     1.90 cm   LV E/e'  lateral: 9.6 LV SV:         64 LV SV Index:   35 LVOT Area:     2.84 cm  RIGHT VENTRICLE RV S prime:     15.40 cm/s TAPSE (M-mode): 2.5 cm LEFT ATRIUM           Index        RIGHT ATRIUM           Index LA diam:      4.10 cm 2.22 cm/m   RA Area:     20.40 cm LA Vol (A2C): 25.9 ml 14.01 ml/m  RA Volume:   56.70 ml  30.68 ml/m LA Vol (A4C): 53.1 ml 28.73 ml/m  AORTIC VALVE LVOT Vmax:   98.60 cm/s LVOT Vmean:  72.500 cm/s LVOT VTI:    0.227 m  AORTA Ao Root diam: 3.20 cm Ao Asc diam:  3.10 cm MITRAL VALVE                TRICUSPID VALVE MV Area (PHT): 4.00 cm     TR Peak grad:   17.6 mmHg MV E velocity: 79.70 cm/s   TR Vmax:        210.00 cm/s MV A velocity: 108.00 cm/s MV E/A ratio:  0.74         SHUNTS                             Systemic VTI:  0.23 m                             Systemic Diam: 1.90 cm Charlton Haws MD Electronically signed by Charlton Haws MD Signature Date/Time: 06/30/2022/2:13:54 PM    Final    CT Angio Chest PE W and/or Wo Contrast  Result Date: 06/30/2022 CLINICAL DATA:  Chest pain which is sharp and substernal radiating to the esophagus. Resolved with sub lingual nitro. EXAM: CT ANGIOGRAPHY CHEST WITH CONTRAST TECHNIQUE: Multidetector CT imaging of the chest was performed using the standard protocol during bolus administration of intravenous contrast. Multiplanar CT image reconstructions and MIPs were obtained to evaluate the vascular anatomy. RADIATION DOSE REDUCTION: This exam was performed according to the departmental dose-optimization program which includes automated exposure control, adjustment of the mA and/or kV according to patient size and/or use of iterative reconstruction technique. CONTRAST:  75mL OMNIPAQUE IOHEXOL 350 MG/ML SOLN COMPARISON:  Chest radiograph 06/29/2022 and CTA chest 03/01/2019 FINDINGS: Cardiovascular: Satisfactory opacification of the pulmonary arteries to  the segmental level. No evidence of pulmonary embolism. Normal heart size. No pericardial effusion.  Coronary artery and aortic atherosclerotic calcification. Mediastinum/Nodes: No enlarged mediastinal, hilar, or axillary lymph nodes. Thyroid gland, trachea, and esophagus demonstrate no significant findings. Lungs/Pleura: Mosaic attenuation of the lungs compatible with air trapping. No focal consolidation, pleural effusion or pneumothorax. Upper Abdomen: No acute abnormality. Mildly nodular hepatic contour suggestive of cirrhosis. Unchanged left hepatic lobe cyst. Cholelithiasis. Musculoskeletal: No chest wall abnormality. No acute osseous findings. Review of the MIP images confirms the above findings. IMPRESSION: 1. No evidence of pulmonary embolism. 2. Mosaic attenuation of the lungs compatible with air trapping. 3. Advanced coronary artery atherosclerosis 4. Cirrhosis. Aortic Atherosclerosis (ICD10-I70.0). Electronically Signed   By: Minerva Fester M.D.   On: 06/30/2022 03:31   DG Chest 2 View  Result Date: 06/29/2022 CLINICAL DATA:  Chest pain EXAM: CHEST - 2 VIEW COMPARISON:  X-ray 10/28/2021 and older FINDINGS: No consolidation, pneumothorax or effusion. Normal cardiopericardial silhouette without edema. Mild degenerative changes of the spine with some curvature. Aortic calcifications. IMPRESSION: No acute cardiopulmonary disease. Electronically Signed   By: Karen Kays M.D.   On: 06/29/2022 17:38    Cardiac Studies   06/30/22 LHC     Dist RCA lesion is 70% stenosed.   RPAV lesion is 90% stenosed.   Dist LM lesion is 50% stenosed.   Prox Cx lesion is 99% stenosed.   Mid Cx to Dist Cx lesion is 99% stenosed with 99% stenosed side branch in 2nd Mrg.   Mid LAD to Dist LAD lesion is 95% stenosed.   1st Diag lesion is 50% stenosed.   Mid LAD lesion is 80% stenosed.   LV end diastolic pressure is normal.   1.  Mild to moderate distal left main stenosis of 50% 2.  Severe mid LAD stenosis with severe diffuse calcification throughout the mid vessel and stenosis up to 90 to 95% 3.  Subtotal  occlusion of the left circumflex with severely diseased obtuse marginal branches 4.  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  I think she has poor targets for grafting and would initially try aggressive medical therapy and antianginal treatment.  The mid to distal LAD appear to be 2 mm in diameter or less and I do not think that branch is suitable for atherectomy and/or PCI.   Diagnostic Dominance: Right    07/01/22 TTE  IMPRESSIONS     1. Left ventricular ejection fraction, by estimation, is 60 to 65%. The  left ventricle has normal function. The left ventricle has no regional  wall motion abnormalities. Left ventricular diastolic parameters are  consistent with Grade I diastolic  dysfunction (impaired relaxation).   2. Right ventricular systolic function is normal. The right ventricular  size is normal.   3. Left atrial size was mildly dilated.   4. The mitral valve is abnormal. Trivial mitral valve regurgitation. No  evidence of mitral stenosis.   5. The aortic valve is tricuspid. There is mild calcification of the  aortic valve. There is mild thickening of the aortic valve. Aortic valve  regurgitation is not visualized. Aortic valve sclerosis is present, with  no evidence of aortic valve stenosis.   6. The inferior vena cava is normal in size with greater than 50%  respiratory variability, suggesting right atrial pressure of 3 mmHg.   FINDINGS  Left Ventricle: Left ventricular ejection fraction, by estimation, is 60  to 65%. The left ventricle has normal function. The left ventricle has no  regional wall motion abnormalities. The left ventricular internal cavity  size was normal in size. There is   no left ventricular hypertrophy. Left ventricular diastolic parameters  are consistent with Grade I diastolic  dysfunction (impaired relaxation).   Right Ventricle: The right ventricular size is normal. No increase in  right ventricular wall thickness. Right ventricular systolic function is  normal.   Left Atrium: Left atrial size was mildly dilated.   Right Atrium: Right atrial size was normal in size.   Pericardium: There is no evidence of pericardial effusion.   Mitral Valve: The mitral valve is abnormal. There is mild thickening of  the mitral valve leaflet(s). There is moderate calcification of the mitral  valve leaflet(s). Trivial mitral valve regurgitation. No evidence of  mitral valve stenosis.   Tricuspid Valve: The tricuspid valve is normal in structure. Tricuspid  valve regurgitation is mild . No evidence of tricuspid stenosis.   Aortic Valve: The aortic valve is tricuspid. There is mild calcification  of the aortic valve. There is mild thickening of the aortic valve. Aortic  valve regurgitation is not visualized. Aortic valve sclerosis is present,  with no evidence of aortic valve  stenosis.   Pulmonic Valve: The pulmonic valve was normal in structure. Pulmonic valve  regurgitation is not visualized. No evidence of pulmonic stenosis.   Aorta: The aortic root is normal in size and structure.   Venous: The inferior vena cava is normal in size with greater than 50%  respiratory variability, suggesting right atrial pressure of 3 mmHg.   IAS/Shunts: The interatrial septum appears to be lipomatous. No atrial  level shunt detected by color flow Doppler.   Patient Profile     Avabelle Wooddell is a 82 y.o. female with a hx of hypertension, hyperlipidemia, fibromyalgia, vascular dementia, type 2 diabetes, CVA who is being seen for the evaluation of pain and shortness of breath.  Assessment & Plan    Chest pain, concerning for accelerating angina/unstable angina Exertional dyspnea   Patient with increasing frequency/severity of shortness of breath and chest pain occurring in the  setting of tachycardia.  Troponin this admission negative/flat.  Chest CTA negative for PE but did notice diffuse and significant coronary artery calcification.  ECG without acute ischemic changes. LHC on 6/6 with severe diffuse coronary calcification/multivessel CAD. TTE with preserved LVEF 60-65% and no WMA.   Given severity of multivessel CAD, interventional cardiology colleagues feel that there are poor targets for grafting/PCI, medical management of CAD/angina recommended. Continue Metoprolol, consolidated to Toprol XL 100mg . Imdur 30mg  added this admission. Patient already on ASA/Plavix s/p CVA. Continue high intensity statin.   Tachycardia   Patient with multi-year hx palpitations and sensation of tachycardia. Prior Zio monitoring has been non-diagnostic for tachy-arrhythmia or atrial fibrillation.    Metoprolol consolidated/transitioned to Toprol XL 100 mg QD. Consider repeating outpatient heart monitor if patient continues to have sensation of rapid HR.   Hypertension   BP slightly elevated today. Continue home Amlodipine, Lisinopril 20mg . Resume home lisinopril 20mg . Metoprolol consolidated to Toprol XL 100mg . Add Imdur 30mg .    Hyperlipidemia   Continue atorvastatin 80mg .        For questions or updates, please contact [facility] Please consult [email or web address] for contact info under        Signed, Perlie Gold, PA-C  07/01/2022, 8:59 AM  Patient seen and examined and agree with Perlie Gold, PA-C as detailed above.  In brief, the patient is a 82 year old female with history of HTN, HLD, fibromyalgia, DMII, vascular dementia (mild) and prior CVA who presented to the ER with chest pain, palpitations and SOB for which Cardiology was consulted.   Patient presented with worsening chest pressure, SOB and palpitations both at rest but worse with exertion. In the ER, trop negative and ECG non-ischemic. CTA PE protocol negative for PE but with very extensive  coronary artery calcification. Ultimately underwent LHC which showed 50% LM, 90-95% mid LAD, 99% mid-distal Lcx, 70% distal RCA. Given severe diffuse coronary calcification with severe multivessel disease with poor targets for grafting, recommended for trial of medical therapy. If refractory symptoms, may consider LAD intervention but would be high risk.    GEN: No acute distress.   Neck: No JVD Cardiac: RRR, no murmurs, rubs, or gallops. Right radial access site c/d/I without hematoma Respiratory: Clear to auscultation bilaterally. GI: Soft, nontender, non-distended  MS: 1+ LLE edema, warm Neuro:  Nonfocal  Psych: Normal affect     Plan: -Cath with severe diffuse coronary calcification and multivessel CAD with poor graft targets; recommended for trial of medical therapy -If medical therapy fails, can consider intervention on LAD -Continue ASA 81mg  daily, plavix 75mg  daily -Continue metop 75mg  XL daily -Continue imdur 30mg  daily and uptitrate as able -Continue lipitor 80mg  daily, zetia 10mg  daily -If patient able to walk the hallway without significant symptoms, okay to discharge home today with plans for CV follow-up  Laurance Flatten, MD

## 2022-07-01 NOTE — TOC Transition Note (Addendum)
Transition of Care [facility]) - CM/SW Discharge Note   Patient Details  Name: Jill Howard MRN: [id number] Date of Birth: 02-11-1940  Transition of Care [facility]) CM/SW Contact:  Leone Haven, RN Phone Number: 07/01/2022, 11:19 AM   Clinical Narrative:    Patient is for dc today, she has transportation at dc. Meds sent [facility].  Discharge cancelled, HR went up per patient.     Barriers to Discharge: Continued Medical Work up   Patient Goals and CMS Choice   Choice offered to / list presented to : NA  Discharge Placement                         Discharge Plan and Services Additional resources added to the After Visit Summary for   In-house Referral: NA Discharge Planning Services: CM Consult Post Acute Care Choice: NA          DME Arranged: N/A DME Agency: NA       HH Arranged: NA          Social Determinants of Health (SDOH) Interventions SDOH Screenings   Tobacco Use: Medium Risk (07/01/2022)     Readmission Risk Interventions     No data to display

## 2022-07-02 DIAGNOSIS — R0789 Other chest pain: Secondary | ICD-10-CM

## 2022-07-02 DIAGNOSIS — I2510 Atherosclerotic heart disease of native coronary artery without angina pectoris: Secondary | ICD-10-CM | POA: Insufficient documentation

## 2022-07-02 DIAGNOSIS — I25118 Atherosclerotic heart disease of native coronary artery with other forms of angina pectoris: Secondary | ICD-10-CM | POA: Diagnosis not present

## 2022-07-02 LAB — GLUCOSE, CAPILLARY
Glucose-Capillary: 223 mg/dL — ABNORMAL HIGH (ref 70–99)
Glucose-Capillary: 248 mg/dL — ABNORMAL HIGH (ref 70–99)

## 2022-07-02 NOTE — Discharge Instructions (Addendum)

## 2022-07-02 NOTE — Discharge Summary (Addendum)
Physician Discharge Summary  Alizah Pion NWG:[id number] DOB: 08/06/1940 DOA: 06/29/2022  PCP: Trenda Moots, PA-C  Admit date: 06/29/2022 Discharge date: 07/02/2022  Admitted From: Home Disposition:  Home  Recommendations for Outpatient Follow-up:  Follow up with PCP in 1 week Follow up with Cardiology in 4-6 weeks Please obtain BMP/CBC in 1 week Please follow up on the following pending results: LPA  Home Health: None  Equipment/Devices: None   Discharge Condition: Stable CODE STATUS: Full  Diet recommendation: Heart Healthy  Brief/Interim Summary: From H&P: From Dr. Pilar Grammes: "Jill Howard is a 82 y.o. 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   She describes this to me as substernal pain worse with lifting things or walking places, associated with shortness of breath, and progressing in the last few days to radiate to the jaw.   It is also associated with "fast heart rate" (see below in assessment and plan).      In the ER, trops normal, ECG showed mild nonspecific abnormality, unchanged from prior.  CTA chest showed no PE but heavy calcification of coronary arteries.  Given aspirin and Cardiology consulted for risk stratification."  Interim: After left heart catheterization showed multivessel disease, they recommended medical management.  Like cardiology clearing for discharge, there was apprehension about her going home due to continued exertional symptoms.  Subjective on day of discharge: She is walked the hallway several times without any chest pain or shortness of breath.  She is more confident, along with her family members, of going home safely.  No palpitations.  No new swelling.  Discharge Diagnoses:  Principal Problem:   Chest pain, CAD  Resolved  Appreciated cardiology  Plavix 75 mg/d  Lipitor  80 mg daily  Aspirin 81 mg daily  Imdur 30 mg daily  Active Problems:   Cerebrovascular disease    Essential hypertension  Norvasc 10 mg daily  Lisinopril 20 mg daily  Toprol-XL 100 mg daily    Diabetes mellitus with complication (HCC)  Resume home Amaryl, insulin, metformin    Hyperlipidemia  Zetia 10 mg/d  Lipitor    Left leg swelling  LE [place] neg for DVT    Obesity (BMI 30-39.9)     Normocytic anemia  F/u outpatient  Discharge Instructions  Discharge Instructions     Call MD for:  persistant dizziness or light-headedness   Complete by: As directed    Diet - low sodium heart healthy   Complete by: As directed    Diet - low sodium heart healthy   Complete by: As directed    Discharge instructions   Complete by: As directed    1)Please take prescribed medications as instructed 2)Follow up with your PCP in a week. 3)Follow up with cardiology as an outpatient.  You will be called for appointment 4)You are on insulin at home.Monitor your blood sugars.  Follow-up with her PCP closely for the management of your diabetes.   Increase activity slowly   Complete by: As directed    Increase activity slowly   Complete by: As directed       Allergies as of 07/02/2022       Reactions   Influenza Vaccines Swelling   Arm was swollen in 2002    Sitagliptin Swelling   Guaifenesin Other (See Comments)   Unknown   Hydroxychloroquine Other (See  Comments)   Eye pain   Latex Other (See Comments)   Unknown   Nitrofurantoin Nausea Only   Other Other (See Comments)   Band-Aid   Phenylephrine-pheniramine-dm Other (See Comments)   Unknown   Ultram [tramadol Hcl] Other (See Comments)   hallucinations   Escitalopram Oxalate Other (See Comments)   Hallucinations        Medication List     STOP taking these medications    ALPRAZolam 0.5 MG tablet Commonly known as: XANAX   aspirin 325 MG tablet Replaced by: aspirin EC 81 MG tablet   metoprolol tartrate 25 MG  tablet Commonly known as: LOPRESSOR   metoprolol tartrate 50 MG tablet Commonly known as: LOPRESSOR       TAKE these medications    acetaminophen 500 MG tablet Commonly known as: TYLENOL Take 500-2,000 mg by mouth every 6 (six) hours as needed for moderate pain (Pain).   amLODipine 10 MG tablet Commonly known as: NORVASC TAKE ONE (1) TABLET BY MOUTH EVERY DAY What changed:  how much to take when to take this   aspirin EC 81 MG tablet Take 1 tablet (81 mg total) by mouth daily. Swallow whole. Replaces: aspirin 325 MG tablet   atorvastatin 80 MG tablet Commonly known as: LIPITOR Take 1 tablet (80 mg total) by mouth at bedtime.   cetirizine 10 MG tablet Commonly known as: ZYRTEC Take 10 mg by mouth at bedtime as needed for allergies or rhinitis.   CLARITIN PO Take 1 tablet by mouth daily.   clopidogrel 75 MG tablet Commonly known as: PLAVIX Take 1 tablet (75 mg total) by mouth daily.   ezetimibe 10 MG tablet Commonly known as: ZETIA Take 1 tablet (10 mg total) by mouth daily. What changed: when to take this   Fish Oil 1000 MG Caps Take 1,000 mg by mouth daily.   glimepiride 4 MG tablet Commonly known as: AMARYL Take 4 mg by mouth 2 (two) times daily.   HumaLOG KwikPen 100 UNIT/ML KwikPen Generic drug: insulin lispro Inject 0-22 Units into the skin 4 (four) times daily.   isosorbide mononitrate 30 MG 24 hr tablet Commonly known as: IMDUR Take 1 tablet (30 mg total) by mouth daily.   lisinopril 20 MG tablet Commonly known as: ZESTRIL Take 20 mg by mouth daily.   Magnesium 250 MG Tabs Take 250 mg by mouth daily.   metFORMIN 1000 MG tablet Commonly known as: GLUCOPHAGE Take 1,000 mg by mouth 2 (two) times daily with a meal.   metoprolol succinate 100 MG 24 hr tablet Commonly known as: TOPROL-XL Take 1 tablet (100 mg total) by mouth daily. Take with or immediately following a meal.   montelukast 10 MG tablet Commonly known as: SINGULAIR Take 10 mg  by mouth at bedtime.   pantoprazole 40 MG tablet Commonly known as: PROTONIX Take 40 mg by mouth daily as needed (Acid reflux).   TOUJEO SOLOSTAR [place] Inject 56 Units into the skin at bedtime.   VITAMIN B-12 PO Take 1 tablet by mouth daily.   Vitamin D 50 MCG (2000 UT) tablet Take 1 tablet (2,000 Units total) by mouth daily. What changed:  medication strength how much to take        Follow-up Information     Trenda Moots, PA-C Follow up.   Specialty: Physician Assistant Why: The office will call patient. Contact information: [address] [place] [address] [phone]  Allergies  Allergen Reactions   Influenza Vaccines Swelling    Arm was swollen in 2002    Sitagliptin Swelling   Guaifenesin Other (See Comments)    Unknown   Hydroxychloroquine Other (See Comments)    Eye pain   Latex Other (See Comments)    Unknown   Nitrofurantoin Nausea Only   Other Other (See Comments)    Band-Aid   Phenylephrine-Pheniramine-Dm Other (See Comments)    Unknown   Ultram [Tramadol Hcl] Other (See Comments)    hallucinations   Escitalopram Oxalate Other (See Comments)    Hallucinations    Consultations: Cardiology   Procedures/Studies: CARDIAC CATHETERIZATION  Result Date: 06/30/2022   Dist RCA lesion is 70% stenosed.   RPAV lesion is 90% stenosed.   Dist LM lesion is 50% stenosed.   Prox Cx lesion is 99% stenosed.   Mid Cx to Dist Cx lesion is 99% stenosed with 99% stenosed side branch in 2nd Mrg.   Mid LAD to Dist LAD lesion is 95% stenosed.   1st Diag lesion is 50% stenosed.   Mid LAD lesion is 80% stenosed.   LV end diastolic pressure is normal. 1.  Mild to moderate distal left main stenosis of 50% 2.  Severe mid LAD stenosis with severe diffuse calcification throughout the mid vessel and stenosis up to 90 to 95% 3.  Subtotal occlusion of the left circumflex with severely diseased obtuse marginal branches 4.  Moderately severe distal RCA  stenosis with severe stenosis at the ostium of the posterior AV branch Recommendations: The patient has very severe diffuse coronary calcification with severe multivessel CAD.  I think she has poor targets for grafting and would initially try aggressive medical therapy and antianginal treatment.  The mid to distal LAD appear to be 2 mm in diameter or less and I do not think that branch is suitable for atherectomy and/or PCI.   VAS [place] LOWER EXTREMITY VENOUS (DVT)  Result Date: 06/30/2022  Lower Venous DVT Study Patient Name:  GRIFFIN TAKATA  Date of Exam:   06/30/2022 Medical Rec #: [id number]     Accession #:    [id number] Date of Birth: 01/06/1941    Patient Gender: F Patient Age:   82 years Exam Location:  [facility] Procedure:      VAS [place] LOWER EXTREMITY VENOUS (DVT) Referring Phys: Joen Laura --------------------------------------------------------------------------------  Indications: Left lower extremity swelling.  Comparison Study: No prior studies. Performing Technologist: Jean Rosenthal RDMS, RVT  Examination Guidelines: A complete evaluation includes B-mode imaging, spectral Doppler, color Doppler, and power Doppler as needed of all accessible portions of each vessel. Bilateral testing is considered an integral part of a complete examination. Limited examinations for reoccurring indications may be performed as noted. The reflux portion of the exam is performed with the patient in reverse Trendelenburg.  +-----+---------------+---------+-----------+----------+--------------+ RIGHTCompressibilityPhasicitySpontaneityPropertiesThrombus Aging +-----+---------------+---------+-----------+----------+--------------+ CFV  Full           Yes      Yes                                 +-----+---------------+---------+-----------+----------+--------------+   +---------+---------------+---------+-----------+----------+--------------+ LEFT      CompressibilityPhasicitySpontaneityPropertiesThrombus Aging +---------+---------------+---------+-----------+----------+--------------+ CFV      Full           Yes      Yes                                 +---------+---------------+---------+-----------+----------+--------------+  SFJ      Full                                                        +---------+---------------+---------+-----------+----------+--------------+ FV Prox  Full                                                        +---------+---------------+---------+-----------+----------+--------------+ FV Mid   Full                                                        +---------+---------------+---------+-----------+----------+--------------+ FV DistalFull                                                        +---------+---------------+---------+-----------+----------+--------------+ PFV      Full                                                        +---------+---------------+---------+-----------+----------+--------------+ POP      Full           Yes      Yes                                 +---------+---------------+---------+-----------+----------+--------------+ PTV      Full                                                        +---------+---------------+---------+-----------+----------+--------------+ PERO     Full                                                        +---------+---------------+---------+-----------+----------+--------------+ Gastroc  Full                                                        +---------+---------------+---------+-----------+----------+--------------+     Summary: RIGHT: - No evidence of common femoral vein obstruction.  LEFT: - There is no evidence of deep vein thrombosis in the lower extremity.  - No cystic structure found in the popliteal fossa.  *See table(s) above for measurements and observations. Electronically signed by  Sherald Hess MD  on 06/30/2022 at 3:09:36 PM.    Final    ECHOCARDIOGRAM COMPLETE  Result Date: 06/30/2022    ECHOCARDIOGRAM REPORT   Patient Name:   MARKAN KILMER Date of Exam: 06/30/2022 Medical Rec #:  [id number]    Height:       63.6 in Accession #:    [id number]   Weight:       177.0 lb Date of Birth:  December 26, 1940   BSA:          1.848 m Patient Age:    78 years     BP:           137/61 mmHg Patient Gender: F            HR:           74 bpm. Exam Location:  Inpatient Procedure: 2D Echo, Cardiac Doppler and Color Doppler Indications:    Chest Pain  History:        Patient has no prior history of Echocardiogram examinations and                 Patient has prior history of Echocardiogram examinations, most                 recent 10/25/2016. Risk Factors:Hypertension, Diabetes and                 Dyslipidemia.  Sonographer:    Trellis Moment RDCS (AE, PE) Referring Phys: [id number] Perlie Gold IMPRESSIONS  1. Left ventricular ejection fraction, by estimation, is 60 to 65%. The left ventricle has normal function. The left ventricle has no regional wall motion abnormalities. Left ventricular diastolic parameters are consistent with Grade I diastolic dysfunction (impaired relaxation).  2. Right ventricular systolic function is normal. The right ventricular size is normal.  3. Left atrial size was mildly dilated.  4. The mitral valve is abnormal. Trivial mitral valve regurgitation. No evidence of mitral stenosis.  5. The aortic valve is tricuspid. There is mild calcification of the aortic valve. There is mild thickening of the aortic valve. Aortic valve regurgitation is not visualized. Aortic valve sclerosis is present, with no evidence of aortic valve stenosis.  6. The inferior vena cava is normal in size with greater than 50% respiratory variability, suggesting right atrial pressure of 3 mmHg. FINDINGS  Left Ventricle: Left ventricular ejection fraction, by estimation, is 60 to 65%. The left ventricle has normal  function. The left ventricle has no regional wall motion abnormalities. The left ventricular internal cavity size was normal in size. There is  no left ventricular hypertrophy. Left ventricular diastolic parameters are consistent with Grade I diastolic dysfunction (impaired relaxation). Right Ventricle: The right ventricular size is normal. No increase in right ventricular wall thickness. Right ventricular systolic function is normal. Left Atrium: Left atrial size was mildly dilated. Right Atrium: Right atrial size was normal in size. Pericardium: There is no evidence of pericardial effusion. Mitral Valve: The mitral valve is abnormal. There is mild thickening of the mitral valve leaflet(s). There is moderate calcification of the mitral valve leaflet(s). Trivial mitral valve regurgitation. No evidence of mitral valve stenosis. Tricuspid Valve: The tricuspid valve is normal in structure. Tricuspid valve regurgitation is mild . No evidence of tricuspid stenosis. Aortic Valve: The aortic valve is tricuspid. There is mild calcification of the aortic valve. There is mild thickening of the aortic valve. Aortic valve regurgitation is not visualized. Aortic valve sclerosis is present, with no evidence of aortic valve stenosis. Pulmonic Valve:  The pulmonic valve was normal in structure. Pulmonic valve regurgitation is not visualized. No evidence of pulmonic stenosis. Aorta: The aortic root is normal in size and structure. Venous: The inferior vena cava is normal in size with greater than 50% respiratory variability, suggesting right atrial pressure of 3 mmHg. IAS/Shunts: The interatrial septum appears to be lipomatous. No atrial level shunt detected by color flow Doppler.  LEFT VENTRICLE PLAX 2D LVIDd:         4.60 cm   Diastology LVIDs:         3.20 cm   LV e' medial:    5.22 cm/s LV PW:         1.00 cm   LV E/e' medial:  15.3 LV IVS:        1.00 cm   LV e' lateral:   8.27 cm/s LVOT diam:     1.90 cm   LV E/e' lateral: 9.6  LV SV:         64 LV SV Index:   35 LVOT Area:     2.84 cm  RIGHT VENTRICLE RV S prime:     15.40 cm/s TAPSE (M-mode): 2.5 cm LEFT ATRIUM           Index        RIGHT ATRIUM           Index LA diam:      4.10 cm 2.22 cm/m   RA Area:     20.40 cm LA Vol (A2C): 25.9 ml 14.01 ml/m  RA Volume:   56.70 ml  30.68 ml/m LA Vol (A4C): 53.1 ml 28.73 ml/m  AORTIC VALVE LVOT Vmax:   98.60 cm/s LVOT Vmean:  72.500 cm/s LVOT VTI:    0.227 m  AORTA Ao Root diam: 3.20 cm Ao Asc diam:  3.10 cm MITRAL VALVE                TRICUSPID VALVE MV Area (PHT): 4.00 cm     TR Peak grad:   17.6 mmHg MV E velocity: 79.70 cm/s   TR Vmax:        210.00 cm/s MV A velocity: 108.00 cm/s MV E/A ratio:  0.74         SHUNTS                             Systemic VTI:  0.23 m                             Systemic Diam: 1.90 cm Charlton Haws MD Electronically signed by Charlton Haws MD Signature Date/Time: 06/30/2022/2:13:54 PM    Final    CT Angio Chest PE W and/or Wo Contrast  Result Date: 06/30/2022 CLINICAL DATA:  Chest pain which is sharp and substernal radiating to the esophagus. Resolved with sub lingual nitro. EXAM: CT ANGIOGRAPHY CHEST WITH CONTRAST TECHNIQUE: Multidetector CT imaging of the chest was performed using the standard protocol during bolus administration of intravenous contrast. Multiplanar CT image reconstructions and MIPs were obtained to evaluate the vascular anatomy. RADIATION DOSE REDUCTION: This exam was performed according to the departmental dose-optimization program which includes automated exposure control, adjustment of the mA and/or kV according to patient size and/or use of iterative reconstruction technique. CONTRAST:  75mL OMNIPAQUE IOHEXOL 350 MG/ML SOLN COMPARISON:  Chest radiograph 06/29/2022 and CTA chest 03/01/2019 FINDINGS: Cardiovascular: Satisfactory opacification of the pulmonary arteries to  the segmental level. No evidence of pulmonary embolism. Normal heart size. No pericardial effusion. Coronary artery  and aortic atherosclerotic calcification. Mediastinum/Nodes: No enlarged mediastinal, hilar, or axillary lymph nodes. Thyroid gland, trachea, and esophagus demonstrate no significant findings. Lungs/Pleura: Mosaic attenuation of the lungs compatible with air trapping. No focal consolidation, pleural effusion or pneumothorax. Upper Abdomen: No acute abnormality. Mildly nodular hepatic contour suggestive of cirrhosis. Unchanged left hepatic lobe cyst. Cholelithiasis. Musculoskeletal: No chest wall abnormality. No acute osseous findings. Review of the MIP images confirms the above findings. IMPRESSION: 1. No evidence of pulmonary embolism. 2. Mosaic attenuation of the lungs compatible with air trapping. 3. Advanced coronary artery atherosclerosis 4. Cirrhosis. Aortic Atherosclerosis (ICD10-I70.0). Electronically Signed   By: Minerva Fester M.D.   On: 06/30/2022 03:31   DG Chest 2 View  Result Date: 06/29/2022 CLINICAL DATA:  Chest pain EXAM: CHEST - 2 VIEW COMPARISON:  X-ray 10/28/2021 and older FINDINGS: No consolidation, pneumothorax or effusion. Normal cardiopericardial silhouette without edema. Mild degenerative changes of the spine with some curvature. Aortic calcifications. IMPRESSION: No acute cardiopulmonary disease. Electronically Signed   By: Karen Kays M.D.   On: 06/29/2022 17:38      Discharge Exam: Vitals:   07/02/22 0510 07/02/22 0827  BP: (!) 159/58 136/60  Pulse: 72 66  Resp: 20 20  Temp: 98.3 F (36.8 C) 97.8 F (36.6 C)  SpO2: 97% 95%    Exam General: Pt is alert, awake, not in acute distress Cardiovascular: RRR, S1/S2 +, 2+ pitting edema on the right lower extremity, 3+ pitting edema over the left lower extremity Respiratory: CTA bilaterally, no wheezing, no rhonchi, no respiratory distress, no conversational dyspnea  Abdominal: Soft, NT, ND, bowel sounds + Extremities: no edema, no cyanosis Psych: Normal mood and affect, stable judgement and insight   The results of  significant diagnostics from this hospitalization (including imaging, microbiology, ancillary and laboratory) are listed below for reference.    Labs: BNP (last 3 results) Recent Labs    06/29/22 2350  BNP 99.9   Basic Metabolic Panel: Recent Labs  Lab 06/29/22 1708 07/01/22 0131  NA 138 137  K 3.5 3.7  CL 111 107  CO2 15* 22  GLUCOSE 128* 160*  BUN 15 14  CREATININE 0.64 0.77  CALCIUM 7.8* 8.9   Liver Function Tests: Recent Labs  Lab 06/29/22 2350  AST 17  ALT 19  ALKPHOS 59  BILITOT 0.7  PROT 6.4*  ALBUMIN 3.0*   Recent Labs  Lab 06/29/22 2350  LIPASE 33   CBC: Recent Labs  Lab 06/29/22 1708 07/01/22 0131  WBC 6.8 6.6  HGB 10.3* 11.2*  HCT 32.8* 34.2*  MCV 92.1 88.8  PLT 165 172   CBG: Recent Labs  Lab 07/01/22 1416 07/01/22 1602 07/01/22 2021 07/02/22 0021 07/02/22 0624  GLUCAP 376* 299* 295* 248* 223*   D-Dimer Recent Labs    06/29/22 2350  DDIMER 0.94*   Hgb A1c Recent Labs    06/29/22 2350  HGBA1C 8.7*    Sepsis Labs Recent Labs  Lab 06/29/22 1708 07/01/22 0131  WBC 6.8 6.6   Patient was seen and examined on the day of discharge and was found to be in stable condition. Time coordinating discharge: 20 minutes including assessment and coordination of care, as well as examination of the patient.   SIGNED:  Sharlene Dory, DO [facility] 07/02/2022, 3:07 PM

## 2022-07-05 LAB — LIPOPROTEIN A (LPA): Lipoprotein (a): 46.6 nmol/L — ABNORMAL HIGH (ref <75.0)

## 2022-07-07 NOTE — Progress Notes (Deleted)
Cardiology Clinic Note   Patient Name: Jill Howard Date of Encounter: 07/07/2022  Primary Care Provider:  Trenda Moots, PA-C Primary Cardiologist:  Nanetta Batty, MD  Patient Profile     82 y.o. female with a hx of hypertension, hyperlipidemia, fibromyalgia, vascular dementia, type 2 diabetes, CVA admitted on 06/29/2022 with shortness of breath,  tachycardia, and chest pain. Cardiac cath on 06/30/2022 revealed sever diffuse coronary calcification severe multivessel disease. Found to be poor candidate for grafting or atherectomy and/or PCI and therefore medical management was recommended. Discharged on 07/02/2022.  Past Medical History    Past Medical History:  Diagnosis Date   Diabetes mellitus without complication (HCC)    Gallstones    Hypertension    Kidney stone    Kidney stone    Stroke (HCC) 10/24/1996   Vertigo    Past Surgical History:  Procedure Laterality Date   ABDOMINAL HYSTERECTOMY     KIDNEY STONE SURGERY     LEFT HEART CATH AND CORONARY ANGIOGRAPHY N/A 06/30/2022   Procedure: LEFT HEART CATH AND CORONARY ANGIOGRAPHY;  Surgeon: Tonny Bollman, MD;  Location: [facility] INVASIVE CV LAB;  Service: Cardiovascular;  Laterality: N/A;    Allergies  Allergies  Allergen Reactions   Influenza Vaccines Swelling    Arm was swollen in 2002    Sitagliptin Swelling   Guaifenesin Other (See Comments)    Unknown   Hydroxychloroquine Other (See Comments)    Eye pain   Latex Other (See Comments)    Unknown   Nitrofurantoin Nausea Only   Other Other (See Comments)    Band-Aid   Phenylephrine-Pheniramine-Dm Other (See Comments)    Unknown   Ultram [Tramadol Hcl] Other (See Comments)    hallucinations   Escitalopram Oxalate Other (See Comments)    Hallucinations    History of Present Illness    Mrs. Greig presents today for TOC follow up after admission for chest pain, found to have severe multivessel CAD, not amendable to PCI or bypass grafting. Medical management  recommended. Now on high dose statin, ECASA, metoprolol increased to 100 mg daily, isosorbide added at 30mg  daily.   Home Medications    Current Outpatient Medications  Medication Sig Dispense Refill   acetaminophen (TYLENOL) 500 MG tablet Take 500-2,000 mg by mouth every 6 (six) hours as needed for moderate pain (Pain).     amLODipine (NORVASC) 10 MG tablet TAKE ONE (1) TABLET BY MOUTH EVERY DAY (Patient taking differently: Take 10 mg by mouth at bedtime.) 90 tablet 3   aspirin EC 81 MG tablet Take 1 tablet (81 mg total) by mouth daily. Swallow whole. 30 tablet 1   atorvastatin (LIPITOR) 80 MG tablet Take 1 tablet (80 mg total) by mouth at bedtime. 30 tablet 3   cetirizine (ZYRTEC) 10 MG tablet Take 10 mg by mouth at bedtime as needed for allergies or rhinitis.     Cholecalciferol (VITAMIN D) 50 MCG (2000 UT) tablet Take 1 tablet (2,000 Units total) by mouth daily.     clopidogrel (PLAVIX) 75 MG tablet Take 1 tablet (75 mg total) by mouth daily. 30 tablet 4   Cyanocobalamin (VITAMIN B-12 PO) Take 1 tablet by mouth daily.     ezetimibe (ZETIA) 10 MG tablet Take 1 tablet (10 mg total) by mouth daily. (Patient taking differently: Take 10 mg by mouth at bedtime.) 90 tablet 3   glimepiride (AMARYL) 4 MG tablet Take 4 mg by mouth 2 (two) times daily.     HUMALOG KWIKPEN 100  UNIT/ML KwikPen Inject 0-22 Units into the skin 4 (four) times daily.     Insulin Glargine (TOUJEO SOLOSTAR [place]) Inject 56 Units into the skin at bedtime.     isosorbide mononitrate (IMDUR) 30 MG 24 hr tablet Take 1 tablet (30 mg total) by mouth daily. 30 tablet 0   lisinopril (PRINIVIL,ZESTRIL) 20 MG tablet Take 20 mg by mouth daily.     Loratadine (CLARITIN PO) Take 1 tablet by mouth daily.     Magnesium 250 MG TABS Take 250 mg by mouth daily.     metFORMIN (GLUCOPHAGE) 1000 MG tablet Take 1,000 mg by mouth 2 (two) times daily with a meal.     metoprolol succinate (TOPROL-XL) 100 MG 24 hr tablet Take 1 tablet (100 mg total) by  mouth daily. Take with or immediately following a meal. 30 tablet 0   montelukast (SINGULAIR) 10 MG tablet Take 10 mg by mouth at bedtime.     Omega-3 Fatty Acids (FISH OIL) 1000 MG CAPS Take 1,000 mg by mouth daily.      pantoprazole (PROTONIX) 40 MG tablet Take 40 mg by mouth daily as needed (Acid reflux).     No current facility-administered medications for this visit.     Family History    Family History  Problem Relation Age of Onset   Stroke Other    Heart attack Other    Cancer Other    Diabetes Other    Stroke Mother    Heart attack Mother    Stroke Maternal Aunt    Stroke Paternal Aunt    Rheum arthritis Paternal Aunt    Stroke Brother    Heart attack Brother    COPD Brother    Rheum arthritis Paternal Aunt    Diabetes Son    Heart disease Son    Diabetes Daughter    She indicated that her mother is deceased. She indicated that her father is deceased. She indicated that her brother is deceased. She indicated that her daughter is alive. She indicated that her son is alive. She indicated that her maternal aunt is deceased. She indicated that both of her paternal aunts are deceased. She indicated that the status of her other is unknown.  Social History    Social History   Socioeconomic History   Marital status: Married    Spouse name: Not on file   Number of children: Not on file   Years of education: Not on file   Highest education level: Not on file  Occupational History   Not on file  Tobacco Use   Smoking status: Former    Types: Cigarettes    Quit date: 85    Years since quitting: 41.4   Smokeless tobacco: Never  Vaping Use   Vaping Use: Never used  Substance and Sexual Activity   Alcohol use: No   Drug use: No   Sexual activity: Not Currently  Other Topics Concern   Not on file  Social History Narrative   Not on file   Social Determinants of Health   Financial Resource Strain: Not on file  Food Insecurity: Not on file  Transportation  Needs: Not on file  Physical Activity: Not on file  Stress: Not on file  Social Connections: Not on file  Intimate Partner Violence: Not on file     Review of Systems    General:  No chills, fever, night sweats or weight changes.  Cardiovascular:  No chest pain, dyspnea on exertion, edema, orthopnea, palpitations,  paroxysmal nocturnal dyspnea. Dermatological: No rash, lesions/masses Respiratory: No cough, dyspnea Urologic: No hematuria, dysuria Abdominal:   No nausea, vomiting, diarrhea, bright red blood per rectum, melena, or hematemesis Neurologic:  No visual changes, wkns, changes in mental status. All other systems reviewed and are otherwise negative except as noted above.     Physical Exam    VS:  There were no vitals taken for this visit. , BMI There is no height or weight on file to calculate BMI.     GEN: Well nourished, well developed, in no acute distress. HEENT: normal. Neck: Supple, no JVD, carotid bruits, or masses. Cardiac: RRR, no murmurs, rubs, or gallops. No clubbing, cyanosis, edema.  Radials/DP/PT 2+ and equal bilaterally.  Respiratory:  Respirations regular and unlabored, clear to auscultation bilaterally. GI: Soft, nontender, nondistended, BS + x 4. MS: no deformity or atrophy. Skin: warm and dry, no rash. Neuro:  Strength and sensation are intact. Psych: Normal affect.  Accessory Clinical Findings    ECG personally reviewed by me today- *** - No acute changes  Lab Results  Component Value Date   WBC 6.6 07/01/2022   HGB 11.2 (L) 07/01/2022   HCT 34.2 (L) 07/01/2022   MCV 88.8 07/01/2022   PLT 172 07/01/2022   Lab Results  Component Value Date   CREATININE 0.77 07/01/2022   BUN 14 07/01/2022   NA 137 07/01/2022   K 3.7 07/01/2022   CL 107 07/01/2022   CO2 22 07/01/2022   Lab Results  Component Value Date   ALT 19 06/29/2022   AST 17 06/29/2022   ALKPHOS 59 06/29/2022   BILITOT 0.7 06/29/2022   Lab Results  Component Value Date    CHOL 150 12/24/2019   HDL 47 12/24/2019   LDLCALC 83 12/24/2019   TRIG 109 12/24/2019   CHOLHDL 3.2 12/24/2019    Lab Results  Component Value Date   HGBA1C 8.7 (H) 06/29/2022    Review of Prior Studies: 06/30/22 LHC      Dist RCA lesion is 70% stenosed.   RPAV lesion is 90% stenosed.   Dist LM lesion is 50% stenosed.   Prox Cx lesion is 99% stenosed.   Mid Cx to Dist Cx lesion is 99% stenosed with 99% stenosed side branch in 2nd Mrg.   Mid LAD to Dist LAD lesion is 95% stenosed.   1st Diag lesion is 50% stenosed.   Mid LAD lesion is 80% stenosed.   LV end diastolic pressure is normal.   1.  Mild to moderate distal left main stenosis of 50% 2.  Severe mid LAD stenosis with severe diffuse calcification throughout the mid vessel and stenosis up to 90 to 95% 3.  Subtotal occlusion of the left circumflex with severely diseased obtuse marginal branches 4.  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  I think she has poor targets for grafting and would initially try aggressive medical therapy and antianginal treatment.  The mid to distal LAD appear to be 2 mm in diameter or less and I do not think that branch is suitable for atherectomy and/or PCI.     Diagnostic Dominance: Right      07/01/22 TTE   IMPRESSIONS     1. Left ventricular ejection fraction, by estimation, is 60 to 65%. The  left ventricle has normal function. The left ventricle has no  regional  wall motion abnormalities. Left ventricular diastolic parameters are  consistent with Grade I diastolic  dysfunction (impaired relaxation).   2. Right ventricular systolic function is normal. The right ventricular  size is normal.   3. Left atrial size was mildly dilated.   4. The mitral valve is abnormal. Trivial mitral valve regurgitation. No  evidence of  mitral stenosis.   5. The aortic valve is tricuspid. There is mild calcification of the  aortic valve. There is mild thickening of the aortic valve. Aortic valve  regurgitation is not visualized. Aortic valve sclerosis is present, with  no evidence of aortic valve stenosis.   6. The inferior vena cava is normal in size with greater than 50%  respiratory variability, suggesting right atrial pressure of 3 mmHg.   FINDINGS   Left Ventricle: Left ventricular ejection fraction, by estimation, is 60  to 65%. The left ventricle has normal function. The left ventricle has no  regional wall motion abnormalities. The left ventricular internal cavity  size was normal in size. There is   no left ventricular hypertrophy. Left ventricular diastolic parameters  are consistent with Grade I diastolic dysfunction (impaired relaxation).   Right Ventricle: The right ventricular size is normal. No increase in  right ventricular wall thickness. Right ventricular systolic function is  normal.   Left Atrium: Left atrial size was mildly dilated.   Right Atrium: Right atrial size was normal in size.   Pericardium: There is no evidence of pericardial effusion.   Mitral Valve: The mitral valve is abnormal. There is mild thickening of  the mitral valve leaflet(s). There is moderate calcification of the mitral  valve leaflet(s). Trivial mitral valve regurgitation. No evidence of  mitral valve stenosis.   Tricuspid Valve: The tricuspid valve is normal in structure. Tricuspid  valve regurgitation is mild . No evidence of tricuspid stenosis.   Aortic Valve: The aortic valve is tricuspid. There is mild calcification  of the aortic valve. There is mild thickening of the aortic valve. Aortic  valve regurgitation is not visualized. Aortic valve sclerosis is present,  with no evidence of aortic valve  stenosis.   Pulmonic Valve: The pulmonic valve was normal in structure. Pulmonic valve  regurgitation is not  visualized. No evidence of pulmonic stenosis.   Aorta: The aortic root is normal in size and structure.   Venous: The inferior vena cava is normal in size with greater than 50%  respiratory variability, suggesting right atrial pressure of 3 mmHg.   IAS/Shunts: The interatrial septum appears to be lipomatous. No atrial  level shunt detected by color flow Doppler.    Patient Profile     Lameshia Hypolite is a 82 y.o. female with a hx of hypertension, hyperlipidemia, fibromyalgia, vascular dementia, type 2 diabetes, CVA who is being seen for the evaluation of pain and shortness of breath.  Assessment & Plan   1.  ***     {Are you ordering a CV Procedure (e.g. stress test, cath, DCCV, TEE, etc)?   Press F2        :[id number]}   Signed, Bettey Mare. Liborio Nixon, ANP, AACC   07/07/2022 2:58 PM      Office [phone] Fax [phone]  Notice: This dictation was prepared with Dragon dictation along with smaller phrase technology. Any transcriptional errors that result from this process are unintentional and may not be corrected upon review.

## 2022-07-11 ENCOUNTER — Encounter (HOSPITAL_COMMUNITY): Payer: Self-pay

## 2022-07-11 ENCOUNTER — Emergency Department (HOSPITAL_COMMUNITY)
Admission: EM | Admit: 2022-07-11 | Discharge: 2022-07-11 | Disposition: A | Discharge status: Home / Self Care | Payer: Medicare Other | Attending: Emergency Medicine | Admitting: Emergency Medicine

## 2022-07-11 ENCOUNTER — Emergency Department (HOSPITAL_COMMUNITY): Payer: Medicare Other

## 2022-07-11 ENCOUNTER — Other Ambulatory Visit: Payer: Self-pay

## 2022-07-11 ENCOUNTER — Ambulatory Visit: Payer: Medicare Other | Admitting: Adult Health

## 2022-07-11 DIAGNOSIS — Z7982 Long term (current) use of aspirin: Secondary | ICD-10-CM | POA: Diagnosis not present

## 2022-07-11 DIAGNOSIS — M79602 Pain in left arm: Secondary | ICD-10-CM | POA: Insufficient documentation

## 2022-07-11 DIAGNOSIS — Z794 Long term (current) use of insulin: Secondary | ICD-10-CM | POA: Insufficient documentation

## 2022-07-11 DIAGNOSIS — Z7984 Long term (current) use of oral hypoglycemic drugs: Secondary | ICD-10-CM | POA: Insufficient documentation

## 2022-07-11 DIAGNOSIS — Z79899 Other long term (current) drug therapy: Secondary | ICD-10-CM | POA: Diagnosis not present

## 2022-07-11 DIAGNOSIS — Z91040 Latex allergy status: Secondary | ICD-10-CM | POA: Diagnosis not present

## 2022-07-11 DIAGNOSIS — I10 Essential (primary) hypertension: Secondary | ICD-10-CM | POA: Diagnosis not present

## 2022-07-11 DIAGNOSIS — R079 Chest pain, unspecified: Secondary | ICD-10-CM | POA: Insufficient documentation

## 2022-07-11 DIAGNOSIS — Z8616 Personal history of COVID-19: Secondary | ICD-10-CM | POA: Diagnosis not present

## 2022-07-11 DIAGNOSIS — E119 Type 2 diabetes mellitus without complications: Secondary | ICD-10-CM | POA: Insufficient documentation

## 2022-07-11 DIAGNOSIS — R69 Illness, unspecified: Secondary | ICD-10-CM | POA: Insufficient documentation

## 2022-07-11 DIAGNOSIS — M79642 Pain in left hand: Secondary | ICD-10-CM | POA: Insufficient documentation

## 2022-07-11 LAB — CBC
HCT: 37.1 % (ref 36.0–46.0)
Hemoglobin: 12 g/dL (ref 12.0–15.0)
MCH: 29 pg (ref 26.0–34.0)
MCHC: 32.3 g/dL (ref 30.0–36.0)
MCV: 89.6 fL (ref 80.0–100.0)
Platelets: 222 10*3/uL (ref 150–400)
RBC: 4.14 MIL/uL (ref 3.87–5.11)
RDW: 13.1 % (ref 11.5–15.5)
WBC: 6.2 10*3/uL (ref 4.0–10.5)
nRBC: 0 % (ref 0.0–0.2)

## 2022-07-11 LAB — BASIC METABOLIC PANEL
Anion gap: 14 (ref 5–15)
BUN: 22 mg/dL (ref 8–23)
CO2: 19 mmol/L — ABNORMAL LOW (ref 22–32)
Calcium: 9.1 mg/dL (ref 8.9–10.3)
Chloride: 102 mmol/L (ref 98–111)
Creatinine, Ser: 0.78 mg/dL (ref 0.44–1.00)
GFR, Estimated: >60 mL/min (ref >60)
Glucose, Bld: 312 mg/dL — ABNORMAL HIGH (ref 70–99)
Potassium: 4.6 mmol/L (ref 3.5–5.1)
Sodium: 135 mmol/L (ref 135–145)

## 2022-07-11 LAB — TROPONIN I (HIGH SENSITIVITY)
Troponin I (High Sensitivity): 4 ng/L (ref <18)
Troponin I (High Sensitivity): 4 ng/L (ref <18)

## 2022-07-11 MED ORDER — ACETAMINOPHEN 500 MG PO TABS
1000.0000 mg | ORAL_TABLET | Freq: Once | ORAL | Status: AC
  Administered 2022-07-11: 1000 mg via ORAL
  Filled 2022-07-11: qty 2

## 2022-07-11 NOTE — Inpatient Diabetes Management (Signed)
Inpatient Diabetes Program Recommendations  AACE/ADA: New Consensus Statement on Inpatient Glycemic Control (2015)  Target Ranges:  Prepandial:   less than 140 mg/dL      Peak postprandial:   less than 180 mg/dL (1-2 hours)      Critically ill patients:  140 - 180 mg/dL   Lab Results  Component Value Date   GLUCAP 223 (H) 07/02/2022   HGBA1C 8.7 (H) 06/29/2022    Review of Glycemic Control  Latest Reference Range & Units 07/11/22 11:14  Glucose 70 - 99 mg/dL 161 (H)   Diabetes history: DM 2 Outpatient Diabetes medications: Toujeo 56 units qhs, Metformin 1000 mg bid, Amaryl 4 mg bid, Humalog 0-22 units 4x/day Current orders for Inpatient glycemic control:  None being evaluated in the ED  Inpatient Diabetes Program Recommendations:    -  Consider Semglee 10 units -  Novolog 0-15 units tid + hs  Thanks,  Christena Deem RN, MSN, BC-ADM Inpatient Diabetes Coordinator Team Pager [phone] (8a-5p)

## 2022-07-11 NOTE — Discharge Instructions (Signed)
1.  Take extra strength Tylenol every 6-8 hours as needed for pain control. 2.  Follow-up with your doctor for recheck in the next 3 to 5 days.  At this time I suspect you have a pinched nerve however return immediately if you get chest pain shortness of breath or lightheadedness.

## 2022-07-11 NOTE — ED Triage Notes (Signed)
Pt BIBEMS from home w cc of left side chest/arm pain starting around 7am causing "burning" sensation per pt. Pt states she was recently here for same complaint and was dc/d with follow up with cardiology and had follow up appointment today however wanted to come to ED for furether eval instead. Pt states she took imdur and had siginifcant relief in discomfort and then pain returned en route to hospital. Pt also received aspirin and nitroglycerin by EMS. Pt a&ox4,VSS,NAD. Pt placed on cardiac monitor upon arrival to ED.

## 2022-07-11 NOTE — ED Provider Notes (Signed)
EMERGENCY DEPARTMENT AT [facility] Provider Note   CSN: [id number] Arrival date & time: 07/11/22  4098     History  Chief Complaint  Patient presents with   Chest Pain    Jill Howard is a 82 y.o. female.  HPI Patient is a 82 y.o. 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 She underwent left heart cath with finding of multivessel disease, severe diffuse coronary calcification. Cardiology recommended medical management.   This morning she developed a burning pain in her left hand.  She reports all of the left hand started burning starting at the fingertips and then working its way up her forearm.  She denies she had any associated chest pain.  Denies associated shortness of breath.  Patient reports she took her Imdur tablet and after about an hour it seemed to ease off.  She reports it still is kind of uncomfortable but is not as severe as at onset.  She did not have weakness or dysfunction of the arm.  Denies similar episode.  Patient was hospitalized earlier this month and underwent left heart cath for unstable angina [phone].  Is identified diffuse coronary artery disease with recommendations for medical management.    Home Medications Prior to Admission medications   Medication Sig Start Date End Date Taking? Authorizing Provider  acetaminophen (TYLENOL) 500 MG tablet Take 500-2,000 mg by mouth every 6 (six) hours as needed for moderate pain (Pain).    [provider]  amLODipine (NORVASC) 10 MG tablet TAKE ONE (1) TABLET BY MOUTH EVERY DAY Patient taking differently: Take 10 mg by mouth at bedtime. 01/25/22   Runell Gess, MD  aspirin EC 81 MG tablet Take 1 tablet (81 mg total) by mouth daily. Swallow whole. 07/02/22   Burnadette Pop, MD  atorvastatin (LIPITOR) 80 MG tablet Take 1 tablet (80 mg total) by mouth at bedtime. 10/25/16   Rai,  Delene Ruffini, MD  cetirizine (ZYRTEC) 10 MG tablet Take 10 mg by mouth at bedtime as needed for allergies or rhinitis.    [provider]  Cholecalciferol (VITAMIN D) 50 MCG (2000 UT) tablet Take 1 tablet (2,000 Units total) by mouth daily. 07/01/22   Burnadette Pop, MD  clopidogrel (PLAVIX) 75 MG tablet Take 1 tablet (75 mg total) by mouth daily. 10/26/16   Rai, Ripudeep K, MD  Cyanocobalamin (VITAMIN B-12 PO) Take 1 tablet by mouth daily.    [provider]  ezetimibe (ZETIA) 10 MG tablet Take 1 tablet (10 mg total) by mouth daily. Patient taking differently: Take 10 mg by mouth at bedtime. 12/30/20   Runell Gess, MD  glimepiride (AMARYL) 4 MG tablet Take 4 mg by mouth 2 (two) times daily. 03/28/19   [provider]  HUMALOG KWIKPEN 100 UNIT/ML KwikPen Inject 0-22 Units into the skin 4 (four) times daily. 06/25/20   [provider]  Insulin Glargine (TOUJEO SOLOSTAR [place]) Inject 56 Units into the skin at bedtime.    [provider]  isosorbide mononitrate (IMDUR) 30 MG 24 hr tablet Take 1 tablet (30 mg total) by mouth daily. 07/02/22   Burnadette Pop, MD  lisinopril (PRINIVIL,ZESTRIL) 20 MG tablet Take 20 mg by mouth daily. 09/09/16   [provider]  Loratadine (CLARITIN PO) Take 1 tablet by mouth daily.    [provider]  Magnesium 250 MG TABS Take 250 mg  by mouth daily.    [provider]  metFORMIN (GLUCOPHAGE) 1000 MG tablet Take 1,000 mg by mouth 2 (two) times daily with a meal.    [provider]  metoprolol succinate (TOPROL-XL) 100 MG 24 hr tablet Take 1 tablet (100 mg total) by mouth daily. Take with or immediately following a meal. 07/02/22   Burnadette Pop, MD  montelukast (SINGULAIR) 10 MG tablet Take 10 mg by mouth at bedtime.    [provider]  Omega-3 Fatty Acids (FISH OIL) 1000 MG CAPS Take 1,000 mg by mouth daily.     [provider]  pantoprazole (PROTONIX) 40 MG tablet Take 40 mg by  mouth daily as needed (Acid reflux).    [provider]      Allergies    Influenza vaccines, Sitagliptin, Guaifenesin, Hydroxychloroquine, Latex, Nitrofurantoin, Other, Phenylephrine-pheniramine-dm, Ultram [tramadol hcl], and Escitalopram oxalate    Review of Systems   Review of Systems  Physical Exam Updated Vital Signs BP (!) 103/55   Pulse 61   Temp 98 F (36.7 C) (Oral)   Resp 16   Ht 5\' 3"  (1.6 m)   Wt 78.5 kg   SpO2 95%   BMI 30.65 kg/m  Physical Exam Constitutional:      Comments: Alert nontoxic clinically well in appearance.  No respiratory distress at rest.  HENT:     Head: Normocephalic and atraumatic.  Cardiovascular:     Rate and Rhythm: Normal rate and regular rhythm.  Pulmonary:     Effort: Pulmonary effort is normal.     Breath sounds: Normal breath sounds.  Abdominal:     General: There is no distension.     Palpations: Abdomen is soft.     Tenderness: There is no abdominal tenderness.  Musculoskeletal:     Comments: Left upper extremity warm and dry.  Radial pulse 2+ and strong.  Grip strength 5\5.  No edema of the extremity.  5\5 strength for extension and flexion against resistance as well as against forced downward pressure in abduction.  With exam at 1 point patient did endorse significant discomfort with point palpation right over the shoulder in the area of the bicipital groove.  Skin:    General: Skin is warm and dry.  Neurological:     General: No focal deficit present.     Mental Status: She is oriented to person, place, and time.     Coordination: Coordination normal.     ED Results / Procedures / Treatments   Labs (all labs ordered are listed, but only abnormal results are displayed) Labs Reviewed  BASIC METABOLIC PANEL - Abnormal; Notable for the following components:      Result Value   CO2 19 (*)    Glucose, Bld 312 (*)    All other components within normal limits  CBC  TROPONIN I (HIGH SENSITIVITY)  TROPONIN I (HIGH  SENSITIVITY)    EKG EKG Interpretation  Date/Time:  Monday July 11 2022 09:45:54 EDT Ventricular Rate:  65 PR Interval:  210 QRS Duration: 96 QT Interval:  436 QTC Calculation: 454 R Axis:   31 Text Interpretation: Sinus rhythm normal, no sig change from previous Confirmed by Arby Barrette [phone]) on 07/11/2022 9:50:58 AM  Radiology DG Shoulder Left  Result Date: 07/11/2022 CLINICAL DATA:  Left arm pain EXAM: LEFT SHOULDER - 3 VIEW COMPARISON:  None Available. FINDINGS: Osteopenia. No fracture or dislocation. Preserved joint spaces and bone mineralization. Only mild joint space loss of the AC joint.  IMPRESSION: Mild degenerative changes of the AC joint.  Osteopenia Electronically Signed   By: Karen Kays M.D.   On: 07/11/2022 11:15   DG Chest 2 View  Result Date: 07/11/2022 CLINICAL DATA:  Left arm pain EXAM: CHEST - 2 VIEW COMPARISON:  06/29/2022 x-ray.  CT angiogram 06/30/2018 FINDINGS: No consolidation, pneumothorax or effusion. Normal cardiopericardial silhouette without edema. Calcified and tortuous aorta. Degenerative changes seen of the spine. Overlapping cardiac leads. Air-fluid level along the stomach beneath the left hemidiaphragm. IMPRESSION: No acute cardiopulmonary disease. Electronically Signed   By: Karen Kays M.D.   On: 07/11/2022 11:14    Procedures Procedures    Medications Ordered in ED Medications  acetaminophen (TYLENOL) tablet 1,000 mg (1,000 mg Oral Given 07/11/22 1115)    ED Course/ Medical Decision Making/ A&P                             Medical Decision Making Amount and/or Complexity of Data Reviewed Labs: ordered. Radiology: ordered.  Risk OTC drugs.   Patient presents with left arm burning sensation.  EMR reviewed.  Patient does have diffuse coronary artery disease and unstable angina.  This time patient is clinically stable with normal vital signs and no chest pain or shortness of breath.  Will obtain labs to rule out NSTEMI\metabolic  derangement.  Will obtain x-ray of the chest and shoulder.  Differential diagnosis includes ACS\musculoskeletal pain\radiculopathy.  Patient reports he typically responds to exercise Tylenol for pain control.  She has not tried anything for pain control this morning.  Will administer a dose of Extra strength Tylenol.   Troponins are flat at 4.  EKG does not show acute ischemic appearance.  Other labs within normal limits.  Chest x-ray interpreted radiology no acute findings.  Shoulder mild degenerative changes.  Patient presents as outlined with left hand pain mostly burning in quality.  At this time with no associated chest pain or shortness of breath, negative troponin and stable EKG, I have low suspicion for ACS.  Patient's neurovascular exam is normal.  At this time I most suspect neuropathy or radiculopathy.  Patient's motor function is good.  At this time she will treat for pain with extra strength Tylenol follow-up on outpatient basis.        Final Clinical Impression(s) / ED Diagnoses Final diagnoses:  Left arm pain  Severe comorbid illness    Rx / DC Orders ED Discharge Orders     None         Arby Barrette, MD 07/11/22 1538

## 2022-07-16 ENCOUNTER — Inpatient Hospital Stay (HOSPITAL_COMMUNITY)
Admission: EM | Admit: 2022-07-16 | Discharge: 2022-07-19 | DRG: 303 | Disposition: A | Discharge status: Home / Self Care | Payer: Medicare Other | Attending: Internal Medicine | Admitting: Internal Medicine

## 2022-07-16 ENCOUNTER — Other Ambulatory Visit: Payer: Self-pay

## 2022-07-16 ENCOUNTER — Emergency Department (HOSPITAL_COMMUNITY): Payer: Medicare Other

## 2022-07-16 ENCOUNTER — Encounter (HOSPITAL_COMMUNITY): Payer: Self-pay | Admitting: Emergency Medicine

## 2022-07-16 DIAGNOSIS — I200 Unstable angina: Secondary | ICD-10-CM | POA: Diagnosis not present

## 2022-07-16 DIAGNOSIS — E785 Hyperlipidemia, unspecified: Secondary | ICD-10-CM | POA: Diagnosis present

## 2022-07-16 DIAGNOSIS — M797 Fibromyalgia: Secondary | ICD-10-CM | POA: Diagnosis present

## 2022-07-16 DIAGNOSIS — D649 Anemia, unspecified: Secondary | ICD-10-CM | POA: Diagnosis present

## 2022-07-16 DIAGNOSIS — Z823 Family history of stroke: Secondary | ICD-10-CM

## 2022-07-16 DIAGNOSIS — I2510 Atherosclerotic heart disease of native coronary artery without angina pectoris: Secondary | ICD-10-CM | POA: Diagnosis present

## 2022-07-16 DIAGNOSIS — Z7982 Long term (current) use of aspirin: Secondary | ICD-10-CM

## 2022-07-16 DIAGNOSIS — F0150 Vascular dementia without behavioral disturbance: Secondary | ICD-10-CM | POA: Diagnosis present

## 2022-07-16 DIAGNOSIS — E118 Type 2 diabetes mellitus with unspecified complications: Secondary | ICD-10-CM | POA: Diagnosis present

## 2022-07-16 DIAGNOSIS — Z91040 Latex allergy status: Secondary | ICD-10-CM

## 2022-07-16 DIAGNOSIS — I25110 Atherosclerotic heart disease of native coronary artery with unstable angina pectoris: Principal | ICD-10-CM | POA: Diagnosis present

## 2022-07-16 DIAGNOSIS — Z79899 Other long term (current) drug therapy: Secondary | ICD-10-CM

## 2022-07-16 DIAGNOSIS — Z794 Long term (current) use of insulin: Secondary | ICD-10-CM

## 2022-07-16 DIAGNOSIS — Z888 Allergy status to other drugs, medicaments and biological substances status: Secondary | ICD-10-CM

## 2022-07-16 DIAGNOSIS — Z8673 Personal history of transient ischemic attack (TIA), and cerebral infarction without residual deficits: Secondary | ICD-10-CM

## 2022-07-16 DIAGNOSIS — Z7984 Long term (current) use of oral hypoglycemic drugs: Secondary | ICD-10-CM

## 2022-07-16 DIAGNOSIS — I10 Essential (primary) hypertension: Secondary | ICD-10-CM | POA: Diagnosis present

## 2022-07-16 DIAGNOSIS — I639 Cerebral infarction, unspecified: Secondary | ICD-10-CM | POA: Diagnosis present

## 2022-07-16 DIAGNOSIS — Z825 Family history of asthma and other chronic lower respiratory diseases: Secondary | ICD-10-CM

## 2022-07-16 DIAGNOSIS — Z7902 Long term (current) use of antithrombotics/antiplatelets: Secondary | ICD-10-CM

## 2022-07-16 DIAGNOSIS — E119 Type 2 diabetes mellitus without complications: Secondary | ICD-10-CM | POA: Diagnosis present

## 2022-07-16 DIAGNOSIS — Z87891 Personal history of nicotine dependence: Secondary | ICD-10-CM

## 2022-07-16 LAB — COMPREHENSIVE METABOLIC PANEL
ALT: 25 U/L (ref 0–44)
AST: 19 U/L (ref 15–41)
Albumin: 3.1 g/dL — ABNORMAL LOW (ref 3.5–5.0)
Alkaline Phosphatase: 71 U/L (ref 38–126)
Anion gap: 10 (ref 5–15)
BUN: 23 mg/dL (ref 8–23)
CO2: 20 mmol/L — ABNORMAL LOW (ref 22–32)
Calcium: 9.3 mg/dL (ref 8.9–10.3)
Chloride: 104 mmol/L (ref 98–111)
Creatinine, Ser: 0.88 mg/dL (ref 0.44–1.00)
GFR, Estimated: >60 mL/min (ref >60)
Glucose, Bld: 302 mg/dL — ABNORMAL HIGH (ref 70–99)
Potassium: 4 mmol/L (ref 3.5–5.1)
Sodium: 134 mmol/L — ABNORMAL LOW (ref 135–145)
Total Bilirubin: 0.8 mg/dL (ref 0.3–1.2)
Total Protein: 6.5 g/dL (ref 6.5–8.1)

## 2022-07-16 LAB — CBC WITH DIFFERENTIAL/PLATELET
Abs Immature Granulocytes: 0.02 10*3/uL (ref 0.00–0.07)
Basophils Absolute: 0.1 10*3/uL (ref 0.0–0.1)
Basophils Relative: 1 %
Eosinophils Absolute: 0.3 10*3/uL (ref 0.0–0.5)
Eosinophils Relative: 4 %
HCT: 35.9 % — ABNORMAL LOW (ref 36.0–46.0)
Hemoglobin: 11.7 g/dL — ABNORMAL LOW (ref 12.0–15.0)
Immature Granulocytes: 0 %
Lymphocytes Relative: 47 %
Lymphs Abs: 3.4 10*3/uL (ref 0.7–4.0)
MCH: 29.2 pg (ref 26.0–34.0)
MCHC: 32.6 g/dL (ref 30.0–36.0)
MCV: 89.5 fL (ref 80.0–100.0)
Monocytes Absolute: 0.6 10*3/uL (ref 0.1–1.0)
Monocytes Relative: 8 %
Neutro Abs: 2.8 10*3/uL (ref 1.7–7.7)
Neutrophils Relative %: 40 %
Platelets: 207 10*3/uL (ref 150–400)
RBC: 4.01 MIL/uL (ref 3.87–5.11)
RDW: 13.1 % (ref 11.5–15.5)
WBC: 7.2 10*3/uL (ref 4.0–10.5)
nRBC: 0 % (ref 0.0–0.2)

## 2022-07-16 LAB — TROPONIN I (HIGH SENSITIVITY): Troponin I (High Sensitivity): 5 ng/L (ref <18)

## 2022-07-16 NOTE — ED Triage Notes (Signed)
Patient to ED via EMS with complaint of chest pain x 1-1/2 hours. Patient took 324 asa at home and ems gave 2 SL nitroglycerin. Patient arrives with a 22 in Left hand

## 2022-07-16 NOTE — ED Provider Notes (Signed)
EMERGENCY DEPARTMENT AT [facility] Provider Note   CSN: [id number] Arrival date & time: 07/16/22  2138     History {Add pertinent medical, surgical, social history, OB history to HPI:1} Chief Complaint  Patient presents with   Chest Pain    Patient to ED via EMS with complaint of chest pain x 1-1/2 hours. Patient took 324 asa at home and ems gave 2 SL nitroglycerin. Patient arrives with a 22 in Left hand    Jill Howard is a 82 y.o. female.  82 year old female brought in by EMS from home with complaint of chest pain, onset prior to arrival. Patient states she walked to the bathroom and back through the house, developed pain in her chest that then resolved with rest. After resting, needed to go to the bathroom again and walked back through the house, developed chest pain again that did not resolve with rest. Patient took 4 81mg  ASA and called 911. EMS provided 2 nitroglycerin en route to the ER which patient felt helped relieve her pain. Pain is described as a burning sensation through the center of her chest up to her chin and down her left arm. Continues to have a burning pain in her left shoulder anteriorly at this time. Reports SHOB earlier, not at this time. No associated diaphoresis, nausea, vomiting.   Family at bedside assists with history- states patient was in her hospital 2 weeks ago with chest pain, found to have significant calcifications but unable to have stents or bypass and started on Imdur and dc home to follow up with cardiology. Patient had return of chest pain (also burning pain in chest and left shoulder) on Monday, 07/11/22, and returned to the ER, missing her cardiology follow up. Now rescheduled for next Friday. Son states patient was sweeping and doing laundry today, feels like she needs to not try and do so much as this aggravates her CP.        Home Medications Prior to Admission medications   Medication Sig Start Date End Date Taking?  Authorizing Provider  acetaminophen (TYLENOL) 500 MG tablet Take 500-2,000 mg by mouth every 6 (six) hours as needed for moderate pain (Pain).    [provider]  amLODipine (NORVASC) 10 MG tablet TAKE ONE (1) TABLET BY MOUTH EVERY DAY Patient taking differently: Take 10 mg by mouth at bedtime. 01/25/22   Runell Gess, MD  aspirin EC 81 MG tablet Take 1 tablet (81 mg total) by mouth daily. Swallow whole. 07/02/22   Burnadette Pop, MD  atorvastatin (LIPITOR) 80 MG tablet Take 1 tablet (80 mg total) by mouth at bedtime. 10/25/16   Rai, Delene Ruffini, MD  cetirizine (ZYRTEC) 10 MG tablet Take 10 mg by mouth at bedtime as needed for allergies or rhinitis.    [provider]  Cholecalciferol (VITAMIN D) 50 MCG (2000 UT) tablet Take 1 tablet (2,000 Units total) by mouth daily. 07/01/22   Burnadette Pop, MD  clopidogrel (PLAVIX) 75 MG tablet Take 1 tablet (75 mg total) by mouth daily. 10/26/16   Rai, Ripudeep K, MD  Cyanocobalamin (VITAMIN B-12 PO) Take 1 tablet by mouth daily.    [provider]  ezetimibe (ZETIA) 10 MG tablet Take 1 tablet (10 mg total) by mouth daily. Patient taking differently: Take 10 mg by mouth at bedtime. 12/30/20   Runell Gess, MD  glimepiride (AMARYL) 4 MG tablet Take 4 mg by mouth 2 (two) times daily. 03/28/19   [provider]  HUMALOG KWIKPEN 100 UNIT/ML KwikPen Inject 0-22 Units into the skin 4 (four) times daily. 06/25/20   [provider]  Insulin Glargine (TOUJEO SOLOSTAR Shevlin) Inject 56 Units into the skin at bedtime.    [provider]  isosorbide mononitrate (IMDUR) 30 MG 24 hr tablet Take 1 tablet (30 mg total) by mouth daily. 07/02/22   Burnadette Pop, MD  lisinopril (PRINIVIL,ZESTRIL) 20 MG tablet Take 20 mg by mouth daily. 09/09/16   [provider]  Loratadine (CLARITIN PO) Take 1 tablet by mouth daily.    [provider]  Magnesium 250 MG TABS Take 250 mg by mouth daily.    [provider]   metFORMIN (GLUCOPHAGE) 1000 MG tablet Take 1,000 mg by mouth 2 (two) times daily with a meal.    [provider]  metoprolol succinate (TOPROL-XL) 100 MG 24 hr tablet Take 1 tablet (100 mg total) by mouth daily. Take with or immediately following a meal. 07/02/22   Burnadette Pop, MD  montelukast (SINGULAIR) 10 MG tablet Take 10 mg by mouth at bedtime.    [provider]  Omega-3 Fatty Acids (FISH OIL) 1000 MG CAPS Take 1,000 mg by mouth daily.     [provider]  pantoprazole (PROTONIX) 40 MG tablet Take 40 mg by mouth daily as needed (Acid reflux).    [provider]      Allergies    Influenza vaccines, Sitagliptin, Guaifenesin, Hydroxychloroquine, Latex, Nitrofurantoin, Other, Phenylephrine-pheniramine-dm, Ultram [tramadol hcl], and Escitalopram oxalate    Review of Systems   Review of Systems Negative except as per HPI Physical Exam Updated Vital Signs BP (!) 140/56   Pulse 74   Temp 98.8 F (37.1 C) (Oral)   Resp 19   Ht 5\' 3"  (1.6 m)   Wt 79 kg   SpO2 100%   BMI 30.85 kg/m  Physical Exam Vitals and nursing note reviewed.  Constitutional:      General: She is not in acute distress.    Appearance: She is well-developed. She is obese. She is not ill-appearing or toxic-appearing.  HENT:     Head: Normocephalic and atraumatic.  Cardiovascular:     Rate and Rhythm: Normal rate and regular rhythm.     Heart sounds: Normal heart sounds.  Pulmonary:     Effort: Pulmonary effort is normal.     Breath sounds: Normal breath sounds.  Chest:     Chest wall: No tenderness.  Abdominal:     Palpations: Abdomen is soft.     Tenderness: There is abdominal tenderness.  Musculoskeletal:     Cervical back: Neck supple.     Right lower leg: Edema present.     Left lower leg: Edema present.     Comments: Left leg worse than right, reports this to be baseline  Skin:    General: Skin is warm and dry.     Findings: No erythema or rash.   Neurological:     General: No focal deficit present.     Mental Status: She is alert and oriented to person, place, and time.  Psychiatric:        Behavior: Behavior normal.     ED Results / Procedures / Treatments   Labs (all labs ordered are listed, but only abnormal results are displayed) Labs Reviewed  CBC WITH DIFFERENTIAL/PLATELET - Abnormal; Notable for the following components:      Result Value   Hemoglobin 11.7 (*)    HCT 35.9 (*)  All other components within normal limits  COMPREHENSIVE METABOLIC PANEL - Abnormal; Notable for the following components:   Sodium 134 (*)    CO2 20 (*)    Glucose, Bld 302 (*)    Albumin 3.1 (*)    All other components within normal limits  TROPONIN I (HIGH SENSITIVITY)    EKG EKG Interpretation  Date/Time:  Saturday July 16 2022 21:42:42 EDT Ventricular Rate:  82 PR Interval:  217 QRS Duration: 96 QT Interval:  407 QTC Calculation: 476 R Axis:   22 Text Interpretation: Sinus rhythm Borderline prolonged PR interval Confirmed by Alvino Blood ([id number]) on 07/16/2022 9:55:30 PM  Radiology No results found.  Procedures Procedures  {Document cardiac monitor, telemetry assessment procedure when appropriate:1}  Medications Ordered in ED Medications - No data to display  ED Course/ Medical Decision Making/ A&P   {   Click here for ABCD2, HEART and other calculatorsREFRESH Note before signing :1}                          Medical Decision Making Amount and/or Complexity of Data Reviewed Labs: ordered.   This patient presents to the ED for concern of ***, this involves an extensive number of treatment options, and is a complaint that carries with it a high risk of complications and morbidity.  The differential diagnosis includes ***   Co morbidities that complicate the patient evaluation  ***   Additional history obtained:  Additional history obtained from *** External records from outside source obtained and  reviewed including ***   Lab Tests:  I Ordered, and personally interpreted labs.  The pertinent results include:  ***   Imaging Studies ordered:  I ordered imaging studies including ***  I independently visualized and interpreted imaging which showed *** I agree with the radiologist interpretation   Cardiac Monitoring: / EKG:  The patient was maintained on a cardiac monitor.  I personally viewed and interpreted the cardiac monitored which showed an underlying rhythm of: ***   Consultations Obtained:  I requested consultation with the ***,  and discussed lab and imaging findings as well as pertinent plan - they recommend: ***   Problem List / ED Course / Critical interventions / Medication management  *** I ordered medication including ***  for ***  Reevaluation of the patient after these medicines showed that the patient {resolved/improved/worsened:23923::"improved"} I have reviewed the patients home medicines and have made adjustments as needed   Social Determinants of Health:  ***   Test / Admission - Considered:  ***   {Document critical care time when appropriate:1} {Document review of labs and clinical decision tools ie heart score, Chads2Vasc2 etc:1}  {Document your independent review of radiology images, and any outside records:1} {Document your discussion with family members, caretakers, and with consultants:1} {Document social determinants of health affecting pt's care:1} {Document your decision making why or why not admission, treatments were needed:1} Final Clinical Impression(s) / ED Diagnoses Final diagnoses:  None    Rx / DC Orders ED Discharge Orders     None

## 2022-07-17 DIAGNOSIS — I10 Essential (primary) hypertension: Secondary | ICD-10-CM | POA: Diagnosis not present

## 2022-07-17 DIAGNOSIS — Z87891 Personal history of nicotine dependence: Secondary | ICD-10-CM | POA: Diagnosis not present

## 2022-07-17 DIAGNOSIS — Z7902 Long term (current) use of antithrombotics/antiplatelets: Secondary | ICD-10-CM | POA: Diagnosis not present

## 2022-07-17 DIAGNOSIS — E119 Type 2 diabetes mellitus without complications: Secondary | ICD-10-CM | POA: Diagnosis not present

## 2022-07-17 DIAGNOSIS — Z794 Long term (current) use of insulin: Secondary | ICD-10-CM | POA: Diagnosis not present

## 2022-07-17 DIAGNOSIS — F0150 Vascular dementia without behavioral disturbance: Secondary | ICD-10-CM | POA: Diagnosis not present

## 2022-07-17 DIAGNOSIS — M797 Fibromyalgia: Secondary | ICD-10-CM | POA: Diagnosis not present

## 2022-07-17 DIAGNOSIS — D649 Anemia, unspecified: Secondary | ICD-10-CM | POA: Diagnosis not present

## 2022-07-17 DIAGNOSIS — I200 Unstable angina: Secondary | ICD-10-CM | POA: Diagnosis present

## 2022-07-17 DIAGNOSIS — Z8673 Personal history of transient ischemic attack (TIA), and cerebral infarction without residual deficits: Secondary | ICD-10-CM | POA: Diagnosis not present

## 2022-07-17 DIAGNOSIS — Z7982 Long term (current) use of aspirin: Secondary | ICD-10-CM | POA: Diagnosis not present

## 2022-07-17 DIAGNOSIS — Z7984 Long term (current) use of oral hypoglycemic drugs: Secondary | ICD-10-CM | POA: Diagnosis not present

## 2022-07-17 DIAGNOSIS — Z79899 Other long term (current) drug therapy: Secondary | ICD-10-CM | POA: Diagnosis not present

## 2022-07-17 DIAGNOSIS — E785 Hyperlipidemia, unspecified: Secondary | ICD-10-CM | POA: Diagnosis not present

## 2022-07-17 DIAGNOSIS — Z823 Family history of stroke: Secondary | ICD-10-CM | POA: Diagnosis not present

## 2022-07-17 DIAGNOSIS — Z888 Allergy status to other drugs, medicaments and biological substances status: Secondary | ICD-10-CM | POA: Diagnosis not present

## 2022-07-17 DIAGNOSIS — Z825 Family history of asthma and other chronic lower respiratory diseases: Secondary | ICD-10-CM | POA: Diagnosis not present

## 2022-07-17 DIAGNOSIS — Z91040 Latex allergy status: Secondary | ICD-10-CM | POA: Diagnosis not present

## 2022-07-17 DIAGNOSIS — I25110 Atherosclerotic heart disease of native coronary artery with unstable angina pectoris: Secondary | ICD-10-CM | POA: Diagnosis not present

## 2022-07-17 LAB — BASIC METABOLIC PANEL
Anion gap: 12 (ref 5–15)
BUN: 18 mg/dL (ref 8–23)
CO2: 21 mmol/L — ABNORMAL LOW (ref 22–32)
Calcium: 9.3 mg/dL (ref 8.9–10.3)
Chloride: 104 mmol/L (ref 98–111)
Creatinine, Ser: 0.94 mg/dL (ref 0.44–1.00)
GFR, Estimated: >60 mL/min (ref >60)
Glucose, Bld: 237 mg/dL — ABNORMAL HIGH (ref 70–99)
Potassium: 3.7 mmol/L (ref 3.5–5.1)
Sodium: 137 mmol/L (ref 135–145)

## 2022-07-17 LAB — GLUCOSE, CAPILLARY
Glucose-Capillary: 259 mg/dL — ABNORMAL HIGH (ref 70–99)
Glucose-Capillary: 460 mg/dL — ABNORMAL HIGH (ref 70–99)
Glucose-Capillary: 305 mg/dL — ABNORMAL HIGH (ref 70–99)
Glucose-Capillary: 255 mg/dL — ABNORMAL HIGH (ref 70–99)
Glucose-Capillary: 254 mg/dL — ABNORMAL HIGH (ref 70–99)

## 2022-07-17 LAB — CBC
HCT: 33.4 % — ABNORMAL LOW (ref 36.0–46.0)
Hemoglobin: 10.9 g/dL — ABNORMAL LOW (ref 12.0–15.0)
MCH: 28.8 pg (ref 26.0–34.0)
MCHC: 32.6 g/dL (ref 30.0–36.0)
MCV: 88.1 fL (ref 80.0–100.0)
Platelets: 199 10*3/uL (ref 150–400)
RBC: 3.79 MIL/uL — ABNORMAL LOW (ref 3.87–5.11)
RDW: 13.2 % (ref 11.5–15.5)
WBC: 6.9 10*3/uL (ref 4.0–10.5)
nRBC: 0 % (ref 0.0–0.2)

## 2022-07-17 LAB — TROPONIN I (HIGH SENSITIVITY)
Troponin I (High Sensitivity): 12 ng/L (ref <18)
Troponin I (High Sensitivity): 20 ng/L — ABNORMAL HIGH (ref <18)
Troponin I (High Sensitivity): 9 ng/L (ref <18)

## 2022-07-17 LAB — GLUCOSE, RANDOM: Glucose, Bld: 391 mg/dL — ABNORMAL HIGH (ref 70–99)

## 2022-07-17 LAB — CREATININE, SERUM
Creatinine, Ser: 0.84 mg/dL (ref 0.44–1.00)
GFR, Estimated: >60 mL/min (ref >60)

## 2022-07-17 MED ORDER — ASPIRIN 81 MG PO TBEC
81.0000 mg | DELAYED_RELEASE_TABLET | Freq: Every day | ORAL | Status: DC
  Administered 2022-07-18 – 2022-07-19 (×2): 81 mg via ORAL
  Filled 2022-07-17 (×2): qty 1

## 2022-07-17 MED ORDER — RANOLAZINE ER 500 MG PO TB12
500.0000 mg | ORAL_TABLET | Freq: Two times a day (BID) | ORAL | Status: DC
  Administered 2022-07-17 – 2022-07-19 (×5): 500 mg via ORAL
  Filled 2022-07-17 (×5): qty 1

## 2022-07-17 MED ORDER — ORAL CARE MOUTH RINSE: 15.0000 mL | OROMUCOSAL | Status: DC | PRN

## 2022-07-17 MED ORDER — INSULIN ASPART 100 UNIT/ML IJ SOLN
0.0000 [IU] | Freq: Every day | INTRAMUSCULAR | Status: DC
  Administered 2022-07-17: 3 [IU] via SUBCUTANEOUS

## 2022-07-17 MED ORDER — INSULIN ASPART 100 UNIT/ML IJ SOLN
0.0000 [IU] | Freq: Three times a day (TID) | INTRAMUSCULAR | Status: DC
  Administered 2022-07-17: 9 [IU] via SUBCUTANEOUS

## 2022-07-17 MED ORDER — ISOSORBIDE MONONITRATE ER 60 MG PO TB24
60.0000 mg | ORAL_TABLET | Freq: Every day | ORAL | Status: DC
  Administered 2022-07-17: 60 mg via ORAL
  Filled 2022-07-17: qty 1

## 2022-07-17 MED ORDER — CLOPIDOGREL BISULFATE 75 MG PO TABS
75.0000 mg | ORAL_TABLET | Freq: Every day | ORAL | Status: DC
  Administered 2022-07-17 – 2022-07-19 (×3): 75 mg via ORAL
  Filled 2022-07-17 (×3): qty 1

## 2022-07-17 MED ORDER — HEPARIN SODIUM (PORCINE) 5000 UNIT/ML IJ SOLN
5000.0000 [IU] | Freq: Three times a day (TID) | INTRAMUSCULAR | Status: DC
  Administered 2022-07-17 – 2022-07-19 (×6): 5000 [IU] via SUBCUTANEOUS
  Filled 2022-07-17 (×7): qty 1

## 2022-07-17 MED ORDER — NITROGLYCERIN IN D5W 200-5 MCG/ML-% IV SOLN
0.0000 ug/min | INTRAVENOUS | Status: DC
  Filled 2022-07-17: qty 250

## 2022-07-17 MED ORDER — MORPHINE SULFATE (PF) 2 MG/ML IV SOLN
2.0000 mg | Freq: Once | INTRAVENOUS | Status: AC
  Administered 2022-07-17: 2 mg via INTRAVENOUS
  Filled 2022-07-17: qty 1

## 2022-07-17 MED ORDER — ONDANSETRON HCL 4 MG/2ML IJ SOLN: 4.0000 mg | Freq: Four times a day (QID) | INTRAMUSCULAR | Status: DC | PRN

## 2022-07-17 MED ORDER — INSULIN GLARGINE-YFGN 100 UNIT/ML ~~LOC~~ SOLN
20.0000 [IU] | Freq: Every day | SUBCUTANEOUS | Status: DC
  Administered 2022-07-17: 20 [IU] via SUBCUTANEOUS
  Filled 2022-07-17 (×2): qty 0.2

## 2022-07-17 MED ORDER — ATORVASTATIN CALCIUM 80 MG PO TABS
80.0000 mg | ORAL_TABLET | Freq: Every day | ORAL | Status: DC
  Administered 2022-07-17 – 2022-07-19 (×3): 80 mg via ORAL
  Filled 2022-07-17 (×3): qty 1

## 2022-07-17 MED ORDER — INSULIN ASPART 100 UNIT/ML IJ SOLN
2.0000 [IU] | Freq: Three times a day (TID) | INTRAMUSCULAR | Status: DC
  Administered 2022-07-17: 2 [IU] via SUBCUTANEOUS

## 2022-07-17 MED ORDER — INSULIN ASPART 100 UNIT/ML IJ SOLN
0.0000 [IU] | Freq: Three times a day (TID) | INTRAMUSCULAR | Status: DC
  Administered 2022-07-17: 8 [IU] via SUBCUTANEOUS
  Administered 2022-07-18: 5 [IU] via SUBCUTANEOUS
  Administered 2022-07-18: 8 [IU] via SUBCUTANEOUS
  Administered 2022-07-18: 5 [IU] via SUBCUTANEOUS
  Administered 2022-07-19: 8 [IU] via SUBCUTANEOUS
  Administered 2022-07-19: 5 [IU] via SUBCUTANEOUS

## 2022-07-17 MED ORDER — INSULIN GLARGINE-YFGN 100 UNIT/ML ~~LOC~~ SOLN
20.0000 [IU] | Freq: Every day | SUBCUTANEOUS | Status: DC
  Filled 2022-07-17: qty 0.2

## 2022-07-17 MED ORDER — AMLODIPINE BESYLATE 10 MG PO TABS: 10.0000 mg | ORAL_TABLET | Freq: Every day | ORAL | Status: DC

## 2022-07-17 MED ORDER — METOPROLOL SUCCINATE ER 50 MG PO TB24
150.0000 mg | ORAL_TABLET | Freq: Every day | ORAL | Status: DC
  Administered 2022-07-17 – 2022-07-18 (×2): 150 mg via ORAL
  Filled 2022-07-17 (×2): qty 1

## 2022-07-17 MED ORDER — NITROGLYCERIN IN D5W 200-5 MCG/ML-% IV SOLN
0.0000 ug/min | INTRAVENOUS | Status: DC
  Administered 2022-07-17: 5 ug/min via INTRAVENOUS
  Filled 2022-07-17: qty 250

## 2022-07-17 MED ORDER — NITROGLYCERIN 0.4 MG SL SUBL: 0.4000 mg | SUBLINGUAL_TABLET | SUBLINGUAL | Status: DC | PRN

## 2022-07-17 MED ORDER — EZETIMIBE 10 MG PO TABS
10.0000 mg | ORAL_TABLET | Freq: Every day | ORAL | Status: DC
  Administered 2022-07-17 – 2022-07-19 (×3): 10 mg via ORAL
  Filled 2022-07-17 (×3): qty 1

## 2022-07-17 MED ORDER — AMLODIPINE BESYLATE 5 MG PO TABS
5.0000 mg | ORAL_TABLET | Freq: Every day | ORAL | Status: DC
  Administered 2022-07-17 – 2022-07-19 (×3): 5 mg via ORAL
  Filled 2022-07-17 (×3): qty 1

## 2022-07-17 MED ORDER — ACETAMINOPHEN 325 MG PO TABS: 650.0000 mg | ORAL_TABLET | ORAL | Status: DC | PRN

## 2022-07-17 NOTE — Inpatient Diabetes Management (Signed)
Inpatient Diabetes Program Recommendations  AACE/ADA: New Consensus Statement on Inpatient Glycemic Control   Target Ranges:  Prepandial:   less than 140 mg/dL      Peak postprandial:   less than 180 mg/dL (1-2 hours)      Critically ill patients:  140 - 180 mg/dL    Latest Reference Range & Units 07/17/22 02:17 07/17/22 08:15  Glucose-Capillary 70 - 99 mg/dL 161 (H) 096 (H)    Latest Reference Range & Units 07/16/22 21:49  Glucose 70 - 99 mg/dL 045 (H)   Review of Glycemic Control  Diabetes history: DM2 Outpatient Diabetes medications: Toujoe 56 units at bedtime, Humalog 0-22 units QID, Metformin 1000 mg BID Amaryl 4 mg BID Current orders for Inpatient glycemic control: Semglee 20 units at bedtime, Novolog 0-9 units TID with melas, Novolog 0-5 units at bedtime, Novolog 2 units TID with meals  Inpatient Diabetes Program Recommendations:    Insulin: Patient did not receive any Semglee last night and CBG 460 mg/dl at 4:09 am today. Please consider changing Semglee to 20 units daily (to start now), increasing Novolog correction to 0-15 units TID with meals.  If post prandial glucose is consistently elevated, consider ordering Novolog 4 units TID with meals for meal coverage if patient eats at least 50% of meals.  NOTE: Noted consult for Diabetes Coordinator. Diabetes Coordinator is not on campus over the weekend but available by pager from 8am to 5pm for questions or concerns.   Thanks, Orlando Penner, RN, MSN, CDCES Diabetes Coordinator Inpatient Diabetes Program [phone] (Team Pager from 8am to 5pm)

## 2022-07-17 NOTE — ED Notes (Signed)
ED TO INPATIENT HANDOFF REPORT  ED Nurse Name and Phone #: Morrie Sheldon RN [phone]  S Name/Age/Gender Jill Howard 82 y.o. female Room/Bed: 032C/032C  Code Status   Code Status: Full Code  Home/SNF/Other Home Patient oriented to: self, place, time, and situation Is this baseline? Yes   Triage Complete: Triage complete  Chief Complaint Unstable angina (HCC) [I20.0]  Triage Note Patient to ED via EMS with complaint of chest pain x 1-1/2 hours. Patient took 324 asa at home and ems gave 2 SL nitroglycerin. Patient arrives with a 22 in Left hand    Allergies Allergies  Allergen Reactions   Influenza Vaccines Swelling    Arm was swollen in 2002    Sitagliptin Swelling   Guaifenesin Other (See Comments)    Unknown   Hydroxychloroquine Other (See Comments)    Eye pain   Latex Other (See Comments)    Unknown   Nitrofurantoin Nausea Only   Other Other (See Comments)    Band-Aid   Phenylephrine-Pheniramine-Dm Other (See Comments)    Unknown   Ultram [Tramadol Hcl] Other (See Comments)    hallucinations   Escitalopram Oxalate Other (See Comments)    Hallucinations    Level of Care/Admitting Diagnosis ED Disposition     ED Disposition  Admit   Condition  --   Comment  Hospital Area: [facility] [[id number]]  Level of Care: Telemetry Cardiac [[id number]]  May place patient in observation at [facility] or [facility] if equivalent level of care is available:: Yes  Covid Evaluation: Asymptomatic - no recent exposure (last 10 days) testing not required  Diagnosis: Unstable angina [facility]) [[id number]]  Admitting Physician: Regan Rakers [[id number]]  Attending Physician: Regan Rakers [[id number]]          B Medical/Surgery History Past Medical History:  Diagnosis Date   Diabetes mellitus without complication (HCC)    Gallstones    Hypertension    Kidney stone    Kidney stone    Stroke (HCC) 10/24/1996   Vertigo    Past Surgical History:   Procedure Laterality Date   ABDOMINAL HYSTERECTOMY     KIDNEY STONE SURGERY     LEFT HEART CATH AND CORONARY ANGIOGRAPHY N/A 06/30/2022   Procedure: LEFT HEART CATH AND CORONARY ANGIOGRAPHY;  Surgeon: Tonny Bollman, MD;  Location: [facility] INVASIVE CV LAB;  Service: Cardiovascular;  Laterality: N/A;     A IV Location/Drains/Wounds Patient Lines/Drains/Airways Status     Active Line/Drains/Airways     Name Placement date Placement time Site Days   Peripheral IV 07/16/22 22 G Left Hand 07/16/22  --  Hand  1   Peripheral IV 07/17/22 22 G Posterior;Right Hand 07/17/22  0112  Hand  less than 1            Intake/Output Last 24 hours No intake or output data in the 24 hours ending 07/17/22 0130  Labs/Imaging Results for orders placed or performed during the hospital encounter of 07/16/22 (from the past 48 hour(s))  Troponin I (High Sensitivity)     Status: None   Collection Time: 07/16/22  9:49 PM  Result Value Ref Range   Troponin I (High Sensitivity) 5 <18 ng/L    Comment: (NOTE) Elevated high sensitivity troponin I (hsTnI) values and significant  changes across serial measurements may suggest ACS but many other  chronic and acute conditions are known to elevate hsTnI results.  Refer to the "Links" section for chest pain algorithms and additional  guidance. Performed at [facility], [address] [place], [place] [address]   CBC with Differential     Status: Abnormal   Collection Time: 07/16/22  9:49 PM  Result Value Ref Range   WBC 7.2 4.0 - 10.5 K/uL   RBC 4.01 3.87 - 5.11 MIL/uL   Hemoglobin 11.7 (L) 12.0 - 15.0 g/dL   HCT 19.1 (L) 47.8 - 29.5 %   MCV 89.5 80.0 - 100.0 fL   MCH 29.2 26.0 - 34.0 pg   MCHC 32.6 30.0 - 36.0 g/dL   RDW 62.1 30.8 - 65.7 %   Platelets 207 150 - 400 K/uL   nRBC 0.0 0.0 - 0.2 %   Neutrophils Relative % 40 %   Neutro Abs 2.8 1.7 - 7.7 K/uL   Lymphocytes Relative 47 %   Lymphs Abs 3.4 0.7 - 4.0 K/uL   Monocytes Relative 8 %    Monocytes Absolute 0.6 0.1 - 1.0 K/uL   Eosinophils Relative 4 %   Eosinophils Absolute 0.3 0.0 - 0.5 K/uL   Basophils Relative 1 %   Basophils Absolute 0.1 0.0 - 0.1 K/uL   Immature Granulocytes 0 %   Abs Immature Granulocytes 0.02 0.00 - 0.07 K/uL    Comment: Performed at [facility], [address] [place], [place] [address]  Comprehensive metabolic panel     Status: Abnormal   Collection Time: 07/16/22  9:49 PM  Result Value Ref Range   Sodium 134 (L) 135 - 145 mmol/L   Potassium 4.0 3.5 - 5.1 mmol/L   Chloride 104 98 - 111 mmol/L   CO2 20 (L) 22 - 32 mmol/L   Glucose, Bld 302 (H) 70 - 99 mg/dL    Comment: Glucose reference range applies only to samples taken after fasting for at least 8 hours.   BUN 23 8 - 23 mg/dL   Creatinine, Ser 2.95 0.44 - 1.00 mg/dL   Calcium 9.3 8.9 - 28.4 mg/dL   Total Protein 6.5 6.5 - 8.1 g/dL   Albumin 3.1 (L) 3.5 - 5.0 g/dL   AST 19 15 - 41 U/L   ALT 25 0 - 44 U/L   Alkaline Phosphatase 71 38 - 126 U/L   Total Bilirubin 0.8 0.3 - 1.2 mg/dL   GFR, Estimated >13 >24 mL/min    Comment: (NOTE) Calculated using the CKD-EPI Creatinine Equation (2021)    Anion gap 10 5 - 15    Comment: Performed at [facility], [address], [place], [place] [address]  Troponin I (High Sensitivity)     Status: None   Collection Time: 07/16/22 11:45 PM  Result Value Ref Range   Troponin I (High Sensitivity) 9 <18 ng/L    Comment: (NOTE) Elevated high sensitivity troponin I (hsTnI) values and significant  changes across serial measurements may suggest ACS but many other  chronic and acute conditions are known to elevate hsTnI results.  Refer to the "Links" section for chest pain algorithms and additional  guidance. Performed at [facility], [address], [place], [place] [address]    DG Chest Port 1 View  Result Date: 07/16/2022 CLINICAL DATA:  Chest pain. EXAM: PORTABLE CHEST 1 VIEW COMPARISON:  August 10, 2022 FINDINGS: The heart size  and mediastinal contours are within normal limits. There is mild calcification of the aortic arch. Both lungs are clear. Multilevel degenerative changes are seen throughout the thoracic spine. IMPRESSION: No active cardiopulmonary disease. Electronically Signed  By: Aram Candela M.D.   On: 07/16/2022 23:14    Pending Labs Unresulted Labs (From admission, onward)     Start     Ordered   07/17/22 0500  Basic metabolic panel  Tomorrow morning,   R        07/17/22 0125   07/17/22 0121  CBC  (heparin)  Once,   R       Comments: Baseline for heparin therapy IF NOT ALREADY DRAWN.  Notify MD if PLT < 100 K.    07/17/22 0125   07/17/22 0121  Creatinine, serum  (heparin)  Once,   R       Comments: Baseline for heparin therapy IF NOT ALREADY DRAWN.    07/17/22 0125            Vitals/Pain Today's Vitals   07/17/22 0120 07/17/22 0123 07/17/22 0124 07/17/22 0125  BP: (!) 146/66  (!) 140/57 (!) 143/58  Pulse: 74  69 70  Resp: 20  20 15   Temp:  98.1 F (36.7 C)    TempSrc:  Oral    SpO2: 100%  99% 100%  Weight:      Height:      PainSc:        Isolation Precautions No active isolations  Medications Medications  aspirin EC tablet 81 mg (has no administration in time range)  nitroGLYCERIN (NITROSTAT) SL tablet 0.4 mg (has no administration in time range)  acetaminophen (TYLENOL) tablet 650 mg (has no administration in time range)  ondansetron (ZOFRAN) injection 4 mg (has no administration in time range)  heparin injection 5,000 Units (has no administration in time range)  amLODipine (NORVASC) tablet 10 mg (has no administration in time range)  atorvastatin (LIPITOR) tablet 80 mg (has no administration in time range)  clopidogrel (PLAVIX) tablet 75 mg (has no administration in time range)  ezetimibe (ZETIA) tablet 10 mg (has no administration in time range)  isosorbide mononitrate (IMDUR) 24 hr tablet 60 mg (has no administration in time range)  metoprolol succinate  (TOPROL-XL) 24 hr tablet 150 mg (has no administration in time range)  insulin glargine-yfgn (SEMGLEE) injection 20 Units (has no administration in time range)  insulin aspart (novoLOG) injection 0-9 Units (has no administration in time range)  insulin aspart (novoLOG) injection 0-5 Units (has no administration in time range)  insulin aspart (novoLOG) injection 2 Units (has no administration in time range)    Mobility walks with person assist, becomes short of breath limiting mobility at this time     Focused Assessments Cardiac Assessment Handoff:  Cardiac Rhythm: Normal sinus rhythm Lab Results  Component Value Date   TROPONINI <0.03 10/24/2016   Lab Results  Component Value Date   DDIMER 0.94 (H) 06/29/2022   Does the Patient currently have chest pain? Yes    R Recommendations: See Admitting Provider Note  Report given to:   Additional Notes:

## 2022-07-17 NOTE — Progress Notes (Signed)
Patient reports no chest pain throughout this shift, nitroglycerin drip continues at 12 mL/hr.

## 2022-07-17 NOTE — ED Notes (Addendum)
Pt c/o ShOB and "weight on chest" while lying still in bed. PA aware of pts symptoms.

## 2022-07-17 NOTE — H&P (Signed)
Cardiology Admission History and Physical   Patient ID: Jill Howard MRN: [id number]; DOB: 1940-04-08   Admission date: 07/16/2022  PCP:  Trenda Moots, PA-C   [facility] Providers Cardiologist:  None        Chief Complaint:  chest pain  Patient Profile:   Jill Howard is a 82 y.o. female with PMHx of multivessel CAD, hypertension, hyperlipidemia, fibromyalgia, vascular dementia, type 2 diabetes, CVA  who is being seen 07/17/2022 for the evaluation of chest pain.  History of Present Illness:   [name] recently presented with anginal symptoms on 06/05, underwent LHC and was noted to have complex multivessel CAD. Given the risk of pursuing coronary intervention, the decision was to do a trial of medical therapy. She was discharged home and came back on 06/17 with similar symptoms. Her cardiac workup resulted negative and she was discharged home. Today she was doin her laundry and felt again sudden onset 10/10 precordial pressure like sensation radiating to the left arm and decided to come in. In the ED negative enzyme and non ischemic ECG. Started on NTG drip with improvement in symptoms.  Past Medical History:  Diagnosis Date   Diabetes mellitus without complication (HCC)    Gallstones    Hypertension    Kidney stone    Kidney stone    Stroke (HCC) 10/24/1996   Vertigo     Past Surgical History:  Procedure Laterality Date   ABDOMINAL HYSTERECTOMY     KIDNEY STONE SURGERY     LEFT HEART CATH AND CORONARY ANGIOGRAPHY N/A 06/30/2022   Procedure: LEFT HEART CATH AND CORONARY ANGIOGRAPHY;  Surgeon: Tonny Bollman, MD;  Location: [facility] INVASIVE CV LAB;  Service: Cardiovascular;  Laterality: N/A;     Medications Prior to Admission: Prior to Admission medications   Medication Sig Start Date End Date Taking? Authorizing Provider  acetaminophen (TYLENOL) 500 MG tablet Take 500-2,000 mg by mouth every 6 (six) hours as needed for moderate pain (Pain).   Yes  [provider]  amLODipine (NORVASC) 10 MG tablet TAKE ONE (1) TABLET BY MOUTH EVERY DAY Patient taking differently: Take 10 mg by mouth at bedtime. 01/25/22  Yes Runell Gess, MD  aspirin EC 81 MG tablet Take 1 tablet (81 mg total) by mouth daily. Swallow whole. 07/02/22  Yes Burnadette Pop, MD  atorvastatin (LIPITOR) 80 MG tablet Take 1 tablet (80 mg total) by mouth at bedtime. 10/25/16  Yes Rai, Ripudeep K, MD  cetirizine (ZYRTEC) 10 MG tablet Take 10 mg by mouth at bedtime as needed for allergies or rhinitis.   Yes [provider]  Cholecalciferol (VITAMIN D) 50 MCG (2000 UT) tablet Take 1 tablet (2,000 Units total) by mouth daily. 07/01/22  Yes Burnadette Pop, MD  clopidogrel (PLAVIX) 75 MG tablet Take 1 tablet (75 mg total) by mouth daily. 10/26/16  Yes Rai, Ripudeep K, MD  Cyanocobalamin (VITAMIN B-12 PO) Take 1 tablet by mouth daily.   Yes [provider]  ezetimibe (ZETIA) 10 MG tablet Take 1 tablet (10 mg total) by mouth daily. Patient taking differently: Take 10 mg by mouth at bedtime. 12/30/20  Yes Runell Gess, MD  glimepiride (AMARYL) 4 MG tablet Take 4 mg by mouth 2 (two) times daily. 03/28/19  Yes [provider]  HUMALOG KWIKPEN 100 UNIT/ML KwikPen Inject 0-22 Units into the skin 4 (four) times daily. 06/25/20  Yes [provider]  Insulin Glargine (TOUJEO SOLOSTAR Horace) Inject 56 Units into the  skin at bedtime.   Yes [provider]  isosorbide mononitrate (IMDUR) 30 MG 24 hr tablet Take 1 tablet (30 mg total) by mouth daily. 07/02/22  Yes Burnadette Pop, MD  lisinopril (PRINIVIL,ZESTRIL) 20 MG tablet Take 20 mg by mouth daily. 09/09/16  Yes [provider]  Loratadine (CLARITIN PO) Take 1 tablet by mouth daily as needed (for allergies).   Yes [provider]  Magnesium 250 MG TABS Take 250 mg by mouth daily.   Yes [provider]  metFORMIN (GLUCOPHAGE) 1000 MG tablet Take 1,000 mg by mouth 2 (two) times  daily with a meal.   Yes [provider]  metoprolol succinate (TOPROL-XL) 100 MG 24 hr tablet Take 1 tablet (100 mg total) by mouth daily. Take with or immediately following a meal. 07/02/22  Yes Adhikari, Amrit, MD  montelukast (SINGULAIR) 10 MG tablet Take 10 mg by mouth at bedtime.   Yes [provider]  Omega-3 Fatty Acids (FISH OIL) 1000 MG CAPS Take 1,000 mg by mouth daily.    Yes [provider]  pantoprazole (PROTONIX) 40 MG tablet Take 40 mg by mouth daily as needed (Acid reflux).   Yes [provider]     Allergies:    Allergies  Allergen Reactions   Influenza Vaccines Swelling    Arm was swollen in 2002    Sitagliptin Swelling   Guaifenesin Other (See Comments)    Unknown   Hydroxychloroquine Other (See Comments)    Eye pain   Latex Other (See Comments)    Unknown   Nitrofurantoin Nausea Only   Other Other (See Comments)    Band-Aid   Phenylephrine-Pheniramine-Dm Other (See Comments)    Unknown   Ultram [Tramadol Hcl] Other (See Comments)    hallucinations   Escitalopram Oxalate Other (See Comments)    Hallucinations    Social History:   Social History   Socioeconomic History   Marital status: Married    Spouse name: Not on file   Number of children: Not on file   Years of education: Not on file   Highest education level: Not on file  Occupational History   Not on file  Tobacco Use   Smoking status: Former    Types: Cigarettes    Quit date: 27    Years since quitting: 41.5   Smokeless tobacco: Never  Vaping Use   Vaping Use: Never used  Substance and Sexual Activity   Alcohol use: No   Drug use: No   Sexual activity: Not Currently  Other Topics Concern   Not on file  Social History Narrative   Not on file   Social Determinants of Health   Financial Resource Strain: Not on file  Food Insecurity: Not on file  Transportation Needs: Not on file  Physical Activity: Not on file  Stress: Not on file  Social  Connections: Not on file  Intimate Partner Violence: Not on file    Family History:   The patient's family history includes COPD in her brother; Cancer in an other family member; Diabetes in her daughter, son, and another family member; Heart attack in her brother, mother, and another family member; Heart disease in her son; Rheum arthritis in her paternal aunt and paternal aunt; Stroke in her brother, maternal aunt, mother, paternal aunt, and another family member.    ROS:  Please see the history of present illness.  All other ROS reviewed and negative.     Physical Exam/Data:   Vitals:  07/17/22 0140 07/17/22 0145 07/17/22 0150 07/17/22 0212  BP: 139/66 (!) 140/60 137/78 (!) 150/61  Pulse: 67 71 71 78  Resp: 19 13 18 20   Temp:    97.9 F (36.6 C)  TempSrc:    Oral  SpO2: 100% 100% 100% 97%  Weight:      Height:    5\' 3"  (1.6 m)   No intake or output data in the 24 hours ending 07/17/22 0220    07/16/2022    9:42 PM 07/11/2022    9:48 AM 07/02/2022    5:10 AM  Last 3 Weights  Weight (lbs) 174 lb 2.6 oz 173 lb 174 lb 4.8 oz  Weight (kg) 79 kg 78.472 kg 79.062 kg     Body mass index is 30.85 kg/m.  General:  Well nourished, well developed, in no acute distress HEENT: normal Neck: no JVD Vascular: No carotid bruits; Distal pulses 2+ bilaterally   Cardiac:  normal S1, S2; RRR; no murmur  Lungs:  clear to auscultation bilaterally, no wheezing, rhonchi or rales  Abd: soft, nontender, no hepatomegaly  Ext: no edema Musculoskeletal:  No deformities, BUE and BLE strength normal and equal Skin: warm and dry  Neuro:  CNs 2-12 intact, no focal abnormalities noted Psych:  Normal affect    EKG:  The ECG that was done  was personally reviewed and demonstrates NSR  Relevant CV Studies:  Laboratory Data:  High Sensitivity Troponin:   Recent Labs  Lab 07/11/22 1114 07/11/22 1339 07/16/22 2149 07/16/22 2345 07/17/22 0111  TROPONINIHS 4 4 5 9 12       Chemistry Recent Labs   Lab 07/11/22 1114 07/16/22 2149 07/17/22 0111  NA 135 134*  --   K 4.6 4.0  --   CL 102 104  --   CO2 19* 20*  --   GLUCOSE 312* 302*  --   BUN 22 23  --   CREATININE 0.78 0.88 0.84  CALCIUM 9.1 9.3  --   GFRNONAA >60 >60 >60  ANIONGAP 14 10  --     Recent Labs  Lab 07/16/22 2149  PROT 6.5  ALBUMIN 3.1*  AST 19  ALT 25  ALKPHOS 71  BILITOT 0.8   Lipids No results for input(s): "CHOL", "TRIG", "HDL", "LABVLDL", "LDLCALC", "CHOLHDL" in the last 168 hours. Hematology Recent Labs  Lab 07/11/22 1114 07/16/22 2149  WBC 6.2 7.2  RBC 4.14 4.01  HGB 12.0 11.7*  HCT 37.1 35.9*  MCV 89.6 89.5  MCH 29.0 29.2  MCHC 32.3 32.6  RDW 13.1 13.1  PLT 222 207   Thyroid No results for input(s): "TSH", "FREET4" in the last 168 hours. BNPNo results for input(s): "BNP", "PROBNP" in the last 168 hours.  DDimer No results for input(s): "DDIMER" in the last 168 hours.   Radiology/Studies:  [facility] Chest Port 1 View  Result Date: 07/16/2022 CLINICAL DATA:  Chest pain. EXAM: PORTABLE CHEST 1 VIEW COMPARISON:  August 10, 2022 FINDINGS: The heart size and mediastinal contours are within normal limits. There is mild calcification of the aortic arch. Both lungs are clear. Multilevel degenerative changes are seen throughout the thoracic spine. IMPRESSION: No active cardiopulmonary disease. Electronically Signed   By: Aram Candela M.D.   On: 07/16/2022 23:14     Assessment and Plan:   82 y.o. female with PMHx of multivessel CAD, hypertension, hyperlipidemia, fibromyalgia, vascular dementia, type 2 diabetes, CVA admitted with UA. Patient has complex coronary anatomy predominantly given the amount of coronary  calcification noted which would likely require plaque modification devices. A trial of medical therapy was attempted. There is still some room to go up on medications. At this time, we will keep her on NTG drip and increase her metoprolol from 100 mg daily to 150 mg daily and her Imdur from 30  g daily to 60 mg daily. Hopefully in the morning we will be able to wean the NTG drip. Depending on her symptoms, may have to run case again by internationalist to discuss again revascularization options.   Severity of Illness: The appropriate patient status for this patient is OBSERVATION. Observation status is judged to be reasonable and necessary in order to provide the required intensity of service to ensure the patient's safety. The patient's presenting symptoms, physical exam findings, and initial radiographic and laboratory data in the context of their medical condition is felt to place them at decreased risk for further clinical deterioration. Furthermore, it is anticipated that the patient will be medically stable for discharge from the hospital within 2 midnights of admission.    For questions or updates, please contact [facility] Please consult [email or web address] for contact info under     Signed, Regan Rakers, MD  07/17/2022 2:20 AM

## 2022-07-17 NOTE — Progress Notes (Signed)
Progress Note  Patient Name: Jill Howard Date of Encounter: 07/17/2022  Primary Cardiologist: None     Patient Profile     82 y.o. female with known multivessel coronary disease, vascular dementia diabetes prior stroke, recently hospitalized 6/24 for anginal symptoms, cath showing complex multivessel disease but medical therapy was elected and now readmitted with chest pain Echo EF 6/24 normal Tn 4>.12>>20   Cath summary MCo  1.  Mild to moderate distal left main stenosis of 50% 2.  Severe mid LAD stenosis with severe diffuse calcification throughout the mid vessel and stenosis up to 90 to 95% 3.  Subtotal occlusion of the left circumflex with severely diseased obtuse marginal branches 4.  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  I think she has poor targets for grafting and would initially try aggressive medical therapy and antianginal treatment.  The mid to distal LAD appear to be 2 mm in diameter or less and I do not think that branch is suitable for atherectomy and/or PCI.   Subjective   No further chest pain  Inpatient Medications    Scheduled Meds:  amLODipine  10 mg Oral Daily   [START ON 07/18/2022] aspirin EC  81 mg Oral Daily   atorvastatin  80 mg Oral Daily   clopidogrel  75 mg Oral Daily   ezetimibe  10 mg Oral Daily   heparin  5,000 Units Subcutaneous Q8H   insulin aspart  0-5 Units Subcutaneous QHS   insulin aspart  0-9 Units Subcutaneous TID WC   insulin aspart  2 Units Subcutaneous TID WC   insulin glargine-yfgn  20 Units Subcutaneous QHS   isosorbide mononitrate  60 mg Oral Daily   metoprolol succinate  150 mg Oral Daily   Continuous Infusions:  nitroGLYCERIN 40 mcg/min (07/17/22 0352)   PRN Meds: acetaminophen, nitroGLYCERIN, ondansetron (ZOFRAN) IV   Vital Signs    Vitals:    07/17/22 0150 07/17/22 0212 07/17/22 0220 07/17/22 0500  BP: 137/78 (!) 150/61  (!) 128/57  Pulse: 71 78  74  Resp: 18 20  15   Temp:  97.9 F (36.6 C)  98.5 F (36.9 C)  TempSrc:  Oral  Oral  SpO2: 100% 97%  94%  Weight:   80.5 kg   Height:  5\' 3"  (1.6 m)      Intake/Output Summary (Last 24 hours) at 07/17/2022 0757 Last data filed at 07/17/2022 0352 Gross per 24 hour  Intake 27.25 ml  Output --  Net 27.25 ml   Filed Weights   07/16/22 2142 07/17/22 0220  Weight: 79 kg 80.5 kg    Telemetry    Sinus- Personally Reviewed  ECG     - Personally Reviewed  Physical Exam    GEN: No acute distress.   Neck: No JVD Cardiac: RRR, no murmurs, rubs, or gallops.  Respiratory: Clear to auscultation bilaterally. GI: Soft, nontender, non-distended  MS: No edema; No deformity. Neuro:  Nonfocal  Psych: Normal affect   Labs    Chemistry Recent Labs  Lab 07/11/22 1114 07/16/22 2149 07/17/22 0111 07/17/22 0334  NA 135 134*  --  137  K 4.6 4.0  --  3.7  CL 102 104  --  104  CO2 19* 20*  --  21*  GLUCOSE 312* 302*  --  237*  BUN 22 23  --  18  CREATININE 0.78 0.88 0.84 0.94  CALCIUM 9.1 9.3  --  9.3  PROT  --  6.5  --   --   ALBUMIN  --  3.1*  --   --   AST  --  19  --   --   ALT  --  25  --   --   ALKPHOS  --  71  --   --   BILITOT  --  0.8  --   --   GFRNONAA >60 >60 >60 >60  ANIONGAP 14 10  --  12     Hematology Recent Labs  Lab 07/11/22 1114 07/16/22 2149 07/17/22 0334  WBC 6.2 7.2 6.9  RBC 4.14 4.01 3.79*  HGB 12.0 11.7* 10.9*  HCT 37.1 35.9* 33.4*  MCV 89.6 89.5 88.1  MCH 29.0 29.2 28.8  MCHC 32.3 32.6 32.6  RDW 13.1 13.1 13.2  PLT 222 207 199    Cardiac EnzymesNo results for input(s): "TROPONINI" in the last 168 hours. No results for input(s): "TROPIPOC" in the last 168 hours.   BNPNo results for input(s): "BNP", "PROBNP" in the last 168 hours.   DDimer No results for input(s): "DDIMER" in the last 168 hours.   Radiology    DG Chest Port 1  View  Result Date: 07/16/2022 CLINICAL DATA:  Chest pain. EXAM: PORTABLE CHEST 1 VIEW COMPARISON:  August 10, 2022 FINDINGS: The heart size and mediastinal contours are within normal limits. There is mild calcification of the aortic arch. Both lungs are clear. Multilevel degenerative changes are seen throughout the thoracic spine. IMPRESSION: No active cardiopulmonary disease. Electronically Signed   By: Aram Candela M.D.   On: 07/16/2022 23:14    Cardiac Studies     Assessment & Plan    Coronary artery disease-complex recent cath and recommendation medical therapy  Hypertension  Diabetes  Vascular disease vascular dementia  Anemia-new onset-mild   For now we will pursue a course of more aggressive medical therapy.  To this end I will decrease amlodipine from 10--5.  I will increase the isosorbide from 30--60 and add ranolazine 500 twice daily  Interventional team will need to review in the morning       For questions or updates, please contact [facility] Please consult [email or web address] for contact info under Cardiology/STEMI.      Signed, Sherryl Manges, MD  07/17/2022, 7:57 AM

## 2022-07-17 NOTE — ED Notes (Signed)
Pt complaining of worsening CP. Repeat EKG given to MD. PA to bedside to evaluate pt.

## 2022-07-17 NOTE — ED Notes (Signed)
Nitroglycerin drip restarted per cardiology

## 2022-07-18 DIAGNOSIS — I200 Unstable angina: Secondary | ICD-10-CM | POA: Diagnosis not present

## 2022-07-18 LAB — GLUCOSE, CAPILLARY
Glucose-Capillary: 271 mg/dL — ABNORMAL HIGH (ref 70–99)
Glucose-Capillary: 236 mg/dL — ABNORMAL HIGH (ref 70–99)
Glucose-Capillary: 208 mg/dL — ABNORMAL HIGH (ref 70–99)
Glucose-Capillary: 230 mg/dL — ABNORMAL HIGH (ref 70–99)

## 2022-07-18 MED ORDER — ISOSORBIDE MONONITRATE ER 60 MG PO TB24
90.0000 mg | ORAL_TABLET | Freq: Every day | ORAL | Status: DC
  Administered 2022-07-18 – 2022-07-19 (×2): 90 mg via ORAL
  Filled 2022-07-18 (×2): qty 1

## 2022-07-18 MED ORDER — INSULIN GLARGINE-YFGN 100 UNIT/ML ~~LOC~~ SOLN
15.0000 [IU] | Freq: Two times a day (BID) | SUBCUTANEOUS | Status: DC
  Administered 2022-07-18 – 2022-07-19 (×3): 15 [IU] via SUBCUTANEOUS
  Filled 2022-07-18 (×4): qty 0.15

## 2022-07-18 MED ORDER — METOPROLOL SUCCINATE ER 50 MG PO TB24: 150.0000 mg | ORAL_TABLET | Freq: Every day | ORAL | Status: DC

## 2022-07-18 NOTE — Progress Notes (Signed)
Mobility Specialist Progress Note:   07/18/22 1424  Mobility  Activity Ambulated independently in hallway  Level of Assistance Independent  Assistive Device None  Distance Ambulated (ft) 500 ft  Activity Response Tolerated well  Mobility Referral Yes  $Mobility charge 1 Mobility  Mobility Specialist Start Time (ACUTE ONLY) 1340  Mobility Specialist Stop Time (ACUTE ONLY) 1355  Mobility Specialist Time Calculation (min) (ACUTE ONLY) 15 min    During Mobility: 91 HR , 97% SpO2  Pt received in bed, agreeable to mobility. Rest break required due to SOB otherwise asymptomatic throughout. Pt returned to EOB with family present and call bell in hand.     Leory Plowman  Mobility Specialist Please contact via [employer] office at [phone]

## 2022-07-18 NOTE — Progress Notes (Addendum)
Rounding Note    Patient Name: Jill Howard Date of Encounter: 07/18/2022  [facility] Cardiologist: Nanetta Batty, MD   Subjective   Last episode of chest pain was yesterday morning while ambulating to the bathroom in ED, no further chest pain since.   Inpatient Medications    Scheduled Meds:  amLODipine  5 mg Oral Daily   aspirin EC  81 mg Oral Daily   atorvastatin  80 mg Oral Daily   clopidogrel  75 mg Oral Daily   ezetimibe  10 mg Oral Daily   heparin  5,000 Units Subcutaneous Q8H   insulin aspart  0-15 Units Subcutaneous TID WC   insulin glargine-yfgn  20 Units Subcutaneous QHS   isosorbide mononitrate  90 mg Oral Daily   metoprolol succinate  150 mg Oral Daily   ranolazine  500 mg Oral BID   Continuous Infusions:   PRN Meds: acetaminophen, nitroGLYCERIN, ondansetron (ZOFRAN) IV, mouth rinse   Vital Signs    Vitals:   07/17/22 1703 07/17/22 1955 07/18/22 0553 07/18/22 0741  BP: (!) 125/55 (!) 130/58 (!) 123/55 (!) 124/57  Pulse:  73 67   Resp:  (!) 22 17   Temp: 97.8 F (36.6 C) 98.5 F (36.9 C) 98.5 F (36.9 C) 98.4 F (36.9 C)  TempSrc: Oral Oral Oral Oral  SpO2:  93% 95% 96%  Weight:      Height:        Intake/Output Summary (Last 24 hours) at 07/18/2022 0844 Last data filed at 07/17/2022 1252 Gross per 24 hour  Intake 108 ml  Output 400 ml  Net -292 ml      07/17/2022    2:20 AM 07/16/2022    9:42 PM 07/11/2022    9:48 AM  Last 3 Weights  Weight (lbs) 177 lb 6.4 oz 174 lb 2.6 oz 173 lb  Weight (kg) 80.468 kg 79 kg 78.472 kg      Telemetry    NSR without significant ventricular ectopy - Personally Reviewed  ECG    NSR without significant ST-T wave changes - Personally Reviewed  Physical Exam   GEN: No acute distress.   Neck: No JVD Cardiac: RRR, no murmurs, rubs, or gallops.  Respiratory: Clear to auscultation bilaterally. GI: Soft, nontender, non-distended  MS: No edema; No deformity. Neuro:  Nonfocal  Psych:  Normal affect   Labs    High Sensitivity Troponin:   Recent Labs  Lab 07/11/22 1339 07/16/22 2149 07/16/22 2345 07/17/22 0111 07/17/22 0334  TROPONINIHS 4 5 9 12  20*     Chemistry Recent Labs  Lab 07/11/22 1114 07/16/22 2149 07/17/22 0111 07/17/22 0334 07/17/22 0852  NA 135 134*  --  137  --   K 4.6 4.0  --  3.7  --   CL 102 104  --  104  --   CO2 19* 20*  --  21*  --   GLUCOSE 312* 302*  --  237* 391*  BUN 22 23  --  18  --   CREATININE 0.78 0.88 0.84 0.94  --   CALCIUM 9.1 9.3  --  9.3  --   PROT  --  6.5  --   --   --   ALBUMIN  --  3.1*  --   --   --   AST  --  19  --   --   --   ALT  --  25  --   --   --  ALKPHOS  --  71  --   --   --   BILITOT  --  0.8  --   --   --   GFRNONAA >60 >60 >60 >60  --   ANIONGAP 14 10  --  12  --     Lipids No results for input(s): "CHOL", "TRIG", "HDL", "LABVLDL", "LDLCALC", "CHOLHDL" in the last 168 hours.  Hematology Recent Labs  Lab 07/11/22 1114 07/16/22 2149 07/17/22 0334  WBC 6.2 7.2 6.9  RBC 4.14 4.01 3.79*  HGB 12.0 11.7* 10.9*  HCT 37.1 35.9* 33.4*  MCV 89.6 89.5 88.1  MCH 29.0 29.2 28.8  MCHC 32.3 32.6 32.6  RDW 13.1 13.1 13.2  PLT 222 207 199   Thyroid No results for input(s): "TSH", "FREET4" in the last 168 hours.  BNPNo results for input(s): "BNP", "PROBNP" in the last 168 hours.  DDimer No results for input(s): "DDIMER" in the last 168 hours.   Radiology    DG Chest Port 1 View  Result Date: 07/16/2022 CLINICAL DATA:  Chest pain. EXAM: PORTABLE CHEST 1 VIEW COMPARISON:  August 10, 2022 FINDINGS: The heart size and mediastinal contours are within normal limits. There is mild calcification of the aortic arch. Both lungs are clear. Multilevel degenerative changes are seen throughout the thoracic spine. IMPRESSION: No active cardiopulmonary disease. Electronically Signed   By: Aram Candela M.D.   On: 07/16/2022 23:14    Cardiac Studies   Cath 06/30/2022   Dist RCA lesion is 70% stenosed.   RPAV  lesion is 90% stenosed.   Dist LM lesion is 50% stenosed.   Prox Cx lesion is 99% stenosed.   Mid Cx to Dist Cx lesion is 99% stenosed with 99% stenosed side branch in 2nd Mrg.   Mid LAD to Dist LAD lesion is 95% stenosed.   1st Diag lesion is 50% stenosed.   Mid LAD lesion is 80% stenosed.   LV end diastolic pressure is normal.   1.  Mild to moderate distal left main stenosis of 50% 2.  Severe mid LAD stenosis with severe diffuse calcification throughout the mid vessel and stenosis up to 90 to 95% 3.  Subtotal occlusion of the left circumflex with severely diseased obtuse marginal branches 4.  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  I think she has poor targets for grafting and would initially try aggressive medical therapy and antianginal treatment.  The mid to distal LAD appear to be 2 mm in diameter or less and I do not think that branch is suitable for atherectomy and/or PCI.    Diagnostic Dominance: Right     Echo 06/30/2022  1. Left ventricular ejection fraction, by estimation, is 60 to 65%. The  left ventricle has normal function. The left ventricle has no regional  wall motion abnormalities. Left ventricular diastolic parameters are  consistent with Grade I diastolic  dysfunction (impaired relaxation).   2. Right ventricular systolic function is normal. The right ventricular  size is normal.   3. Left atrial size was mildly dilated.   4. The mitral valve is abnormal. Trivial mitral valve regurgitation. No  evidence of mitral stenosis.   5. The aortic valve is tricuspid. There is mild calcification of the  aortic valve. There is mild thickening of the aortic valve. Aortic valve  regurgitation is not visualized. Aortic valve  sclerosis is present, with  no evidence of aortic valve stenosis.   6. The inferior  vena cava is normal in size with greater than 50%  respiratory variability, suggesting right atrial pressure of 3 mmHg.    Patient Profile     82 y.o. female with PMH of HTN, HLD, DM II, CVA, fibromyalgia and vascular dementia who was recently cathed on 06/30/2022 which revealed multivessel CAD, medical therapy recommended. However patient returned with recurrent chest pain  Assessment & Plan    Unstable chest pain  - recent cath 06/30/2022 showed 70% dRCA, 90% RPAV, 50% dLM, 99% pLCx, 99% mid to distal LCx, 95% mid to distal LAD, 50% D1, 80% mLAD. Medical therapy recommended. Felt to have poor targets for grafting, mid to distal LAD is 2mm or less, therefore not suitable for PCI  - Echo 06/30/2022 showed EF60-65%  - since discharge, patient was seen on 6/17 in ED for L arm pain. Returned again on 07/16/2022 with recurrent chest pain  - metoprolol and imdur has been increased for antianginal purpose. Amlodipine decreased. Ranolazine added.   - serial hs troponin 9 --> 12 --> 20  - will discuss with Dr. Excell Seltzer, if no target for PCI, will continue uptitrate antianginal medication. Stop IV nitro, increase imdur to 90mg  daily. Ambulate the patient today if no intervention is planned.   CAD: see above, recent cath multi vessel CAD on 06/30/2022. ASA, plavix and lipitor  HTN: BP stable SBP 120s, will increase imdur to 90mg  daily.   HLD: on lipitor  DM II  H/o CVA in 2018: recurrence.       For questions or updates, please contact [facility] Please consult [email or web address] for contact info under        Signed, Azalee Course, PA  07/18/2022, 8:44 AM    I have seen and examined the patient along with Azalee Course, PA .  I have reviewed the chart, notes and new data.  I agree with PA/NP's note.  Key new complaints: No angina, but had a strange sensation of "floating" and feeling "about to fall asleep" with fingers tingling roughly 30-60 minutes after morning meds. Better after 20-30 minutes Key  examination changes: normal BP and HR Key new findings / data: no arrhythmia on telemetry.  PLAN: Her symptoms may have been due to the simultaneous administration of isosorbide, metoprolol and amlodipine and a transient dip in BP, but most likely this was a ranolazine side effect. She will get another dose this evening and we will monitor for recurrence. Regardless, I think we need to "space out" her antianginal meds. Will switch metop succinate to evening, continue amlodipine and isosorbide in AM. Reviewed coronary anatomy and options for revascularization w Dr. Excell Seltzer. The LCX seems to be a CT. The heavily calcified LAD stenosis is the most likely source of symptoms that could be amenable to PCI, but is calcified and the vessel downstream of the stenosis is small in caliber. Risk of perforation or poor PCI outcome is therefore higher.  Thurmon Fair, MD, [facility] [phone] 07/18/2022, 11:41 AM

## 2022-07-18 NOTE — Inpatient Diabetes Management (Signed)
Inpatient Diabetes Program Recommendations  AACE/ADA: New Consensus Statement on Inpatient Glycemic Control (2015)  Target Ranges:  Prepandial:   less than 140 mg/dL      Peak postprandial:   less than 180 mg/dL (1-2 hours)      Critically ill patients:  140 - 180 mg/dL   Lab Results  Component Value Date   GLUCAP 271 (H) 07/18/2022   HGBA1C 8.7 (H) 06/29/2022    Review of Glycemic Control  Latest Reference Range & Units 07/17/22 08:15 07/17/22 11:58 07/17/22 17:03 07/17/22 21:44 07/18/22 07:44  Glucose-Capillary 70 - 99 mg/dL 161 (H) 096 (H) 045 (H) 254 (H) 271 (H)  (H): Data is abnormally high Diabetes history: DM2 Outpatient Diabetes medications: Toujoe 56 units at bedtime, Humalog 0-22 units QID, Metformin 1000 mg BID Amaryl 4 mg BID Current orders for Inpatient glycemic control: Semglee 20 units at bedtime, Novolog 0-15 units TID with meals, Novolog 0-5 units at bedtime, Semglee 20 units QHS   Inpatient Diabetes Program Recommendations:     Consider increasing Semglee to 15 units BID.   Thanks, Lujean Rave, MSN, RNC-OB Diabetes Coordinator [phone] (8a-5p)

## 2022-07-19 DIAGNOSIS — I200 Unstable angina: Secondary | ICD-10-CM | POA: Diagnosis not present

## 2022-07-19 LAB — GLUCOSE, CAPILLARY
Glucose-Capillary: 254 mg/dL — ABNORMAL HIGH (ref 70–99)
Glucose-Capillary: 298 mg/dL — ABNORMAL HIGH (ref 70–99)
Glucose-Capillary: 246 mg/dL — ABNORMAL HIGH (ref 70–99)
Glucose-Capillary: 334 mg/dL — ABNORMAL HIGH (ref 70–99)

## 2022-07-19 MED ORDER — ISOSORBIDE MONONITRATE ER 60 MG PO TB24: 90.0000 mg | ORAL_TABLET | Freq: Every morning | ORAL | 2 refills | Status: DC

## 2022-07-19 MED ORDER — AMLODIPINE BESYLATE 5 MG PO TABS: 5.0000 mg | ORAL_TABLET | Freq: Every day | ORAL | 1 refills | Status: DC

## 2022-07-19 MED ORDER — RANOLAZINE ER 500 MG PO TB12: 500.0000 mg | ORAL_TABLET | Freq: Two times a day (BID) | ORAL | 5 refills | Status: DC

## 2022-07-19 MED ORDER — METOPROLOL SUCCINATE ER 50 MG PO TB24: 150.0000 mg | ORAL_TABLET | Freq: Every day | ORAL | 5 refills | Status: DC

## 2022-07-19 MED ORDER — NITROGLYCERIN 0.4 MG SL SUBL: 0.4000 mg | SUBLINGUAL_TABLET | SUBLINGUAL | 3 refills | Status: DC | PRN

## 2022-07-19 NOTE — Discharge Summary (Signed)
Discharge Summary    Patient ID: Jill Howard MRN: [id number]; DOB: [birth year]  Admit date: 07/16/2022 Discharge date: 07/19/2022  PCP:  Trenda Moots, PA-C   [facility] Providers Cardiologist:  Nanetta Batty, MD        Discharge Diagnoses    Principal Problem:   Unstable angina [facility]) Active Problems:   Essential hypertension   Diabetes mellitus with complication (HCC)   CVA (cerebral vascular accident) [facility])   Hyperlipidemia   Coronary artery disease    Diagnostic Studies/Procedures    N/A _____________   History of Present Illness     Jill Howard is a 82 y.o. female with PMHx of multivessel CAD, hypertension, hyperlipidemia, fibromyalgia, vascular dementia, type 2 diabetes, CVA  who is being seen 07/17/2022 for the evaluation of chest pain.   Jill Howard recently presented with anginal symptoms on 06/05, underwent LHC and was noted to have complex multivessel CAD. Given the risk of pursuing coronary intervention, the decision was to do a trial of medical therapy. She was discharged home and came back on 06/17 with similar symptoms. Her cardiac workup resulted negative and she was discharged home. Today she was doin her laundry and felt again sudden onset 10/10 precordial pressure like sensation radiating to the left arm and decided to come in. In the ED negative enzyme and non ischemic ECG. Started on NTG drip with improvement in symptoms.  Hospital Course     Consultants: N/A   Patient was admitted to cardiology service.  Metoprolol and Imdur were increased for antianginal purposes.  Amlodipine decreased.  Ranolazine was added as well.  Dr. Royann Shivers recommended to avoid higher dose of ranolazine due to possible liver disease. Her case was discussed with Dr. Excell Seltzer who reviewed coronary anatomy and options of revascularization.  The heavily calcified LAD stenosis is most likely the source of symptom that could be amenable to PCI, however is quite calcified  and that the vessel downstream up to stenosis is small in caliber.  Risk of perforation or poor PCI outcome is higher.  Therefore medication management was pursued.   She was seen in the morning of 07/19/2022 at which time she was doing well.  During this hospitalization, lisinopril was discontinued.  She was placed on metoprolol succinate 150 mg every night, 90 mg daily of Imdur every morning.  Discharge Medications: Ranolazine 500 mg twice daily (try to avoid higher dose to possible liver disease) Metoprolol succinate 150 mg daily - take in the evening Isosorbide mononitrate 90 mg daily - take in the morning Amlodipine 5 mg daily ASA 81 mg daily Clopidogrel 75 mg daily Atorvastatin 80 mg daily Ezetimibe 10 mg daily Stop lisinopril      Did the patient have an acute coronary syndrome (MI, NSTEMI, STEMI, etc) this admission?:  No                               Did the patient have a percutaneous coronary intervention (stent / angioplasty)?:  No.          _____________  Discharge Vitals Blood pressure 133/64, pulse 76, temperature 98 F (36.7 C), temperature source Oral, resp. rate 18, height 5\' 3"  (1.6 m), weight 80.5 kg, SpO2 97 %.  Filed Weights   07/16/22 2142 07/17/22 0220  Weight: 79 kg 80.5 kg    Labs & Radiologic Studies    CBC Recent Labs    07/16/22 2149 07/17/22 0334  WBC 7.2 6.9  NEUTROABS 2.8  --   HGB 11.7* 10.9*  HCT 35.9* 33.4*  MCV 89.5 88.1  PLT 207 199   Basic Metabolic Panel Recent Labs    95/62/13 2149 07/17/22 0111 07/17/22 0334 07/17/22 0852  NA 134*  --  137  --   K 4.0  --  3.7  --   CL 104  --  104  --   CO2 20*  --  21*  --   GLUCOSE 302*  --  237* 391*  BUN 23  --  18  --   CREATININE 0.88 0.84 0.94  --   CALCIUM 9.3  --  9.3  --    Liver Function Tests Recent Labs    07/16/22 2149  AST 19  ALT 25  ALKPHOS 71  BILITOT 0.8  PROT 6.5  ALBUMIN 3.1*   No results for input(s): "LIPASE", "AMYLASE" in the last 72 hours. High  Sensitivity Troponin:   Recent Labs  Lab 07/11/22 1339 07/16/22 2149 07/16/22 2345 07/17/22 0111 07/17/22 0334  TROPONINIHS 4 5 9 12  20*    BNP Invalid input(s): "POCBNP" D-Dimer No results for input(s): "DDIMER" in the last 72 hours. Hemoglobin A1C No results for input(s): "HGBA1C" in the last 72 hours. Fasting Lipid Panel No results for input(s): "CHOL", "HDL", "LDLCALC", "TRIG", "CHOLHDL", "LDLDIRECT" in the last 72 hours. Thyroid Function Tests No results for input(s): "TSH", "T4TOTAL", "T3FREE", "THYROIDAB" in the last 72 hours.  Invalid input(s): "FREET3" _____________  [facility] Chest Port 1 View  Result Date: 07/16/2022 CLINICAL DATA:  Chest pain. EXAM: PORTABLE CHEST 1 VIEW COMPARISON:  August 10, 2022 FINDINGS: The heart size and mediastinal contours are within normal limits. There is mild calcification of the aortic arch. Both lungs are clear. Multilevel degenerative changes are seen throughout the thoracic spine. IMPRESSION: No active cardiopulmonary disease. Electronically Signed   By: Aram Candela M.D.   On: 07/16/2022 23:14   DG Shoulder Left  Result Date: 07/11/2022 CLINICAL DATA:  Left arm pain EXAM: LEFT SHOULDER - 3 VIEW COMPARISON:  None Available. FINDINGS: Osteopenia. No fracture or dislocation. Preserved joint spaces and bone mineralization. Only mild joint space loss of the AC joint. IMPRESSION: Mild degenerative changes of the AC joint.  Osteopenia Electronically Signed   By: Karen Kays M.D.   On: 07/11/2022 11:15   DG Chest 2 View  Result Date: 07/11/2022 CLINICAL DATA:  Left arm pain EXAM: CHEST - 2 VIEW COMPARISON:  06/29/2022 x-ray.  CT angiogram 06/30/2018 FINDINGS: No consolidation, pneumothorax or effusion. Normal cardiopericardial silhouette without edema. Calcified and tortuous aorta. Degenerative changes seen of the spine. Overlapping cardiac leads. Air-fluid level along the stomach beneath the left hemidiaphragm. IMPRESSION: No acute cardiopulmonary  disease. Electronically Signed   By: Karen Kays M.D.   On: 07/11/2022 11:14   CARDIAC CATHETERIZATION  Result Date: 06/30/2022   Dist RCA lesion is 70% stenosed.   RPAV lesion is 90% stenosed.   Dist LM lesion is 50% stenosed.   Prox Cx lesion is 99% stenosed.   Mid Cx to Dist Cx lesion is 99% stenosed with 99% stenosed side branch in 2nd Mrg.   Mid LAD to Dist LAD lesion is 95% stenosed.   1st Diag lesion is 50% stenosed.   Mid LAD lesion is 80% stenosed.   LV end diastolic pressure is normal. 1.  Mild to moderate distal left main stenosis of 50% 2.  Severe mid LAD stenosis with severe diffuse calcification  throughout the mid vessel and stenosis up to 90 to 95% 3.  Subtotal occlusion of the left circumflex with severely diseased obtuse marginal branches 4.  Moderately severe distal RCA stenosis with severe stenosis at the ostium of the posterior AV branch Recommendations: The patient has very severe diffuse coronary calcification with severe multivessel CAD.  I think she has poor targets for grafting and would initially try aggressive medical therapy and antianginal treatment.  The mid to distal LAD appear to be 2 mm in diameter or less and I do not think that branch is suitable for atherectomy and/or PCI.   VAS [place] LOWER EXTREMITY VENOUS (DVT)  Result Date: 06/30/2022  Lower Venous DVT Study Patient Name:  KIRBI FARRUGIA  Date of Exam:   06/30/2022 Medical Rec #: [id number]     Accession #:    [id number] Date of Birth: [birth year]    Patient Gender: F Patient Age:   63 years Exam Location:  [facility] Procedure:      VAS [place] LOWER EXTREMITY VENOUS (DVT) Referring Phys: Joen Laura --------------------------------------------------------------------------------  Indications: Left lower extremity swelling.  Comparison Study: No prior studies. Performing Technologist: Jean Rosenthal RDMS, RVT  Examination Guidelines: A complete evaluation includes B-mode imaging, spectral Doppler, color Doppler, and  power Doppler as needed of all accessible portions of each vessel. Bilateral testing is considered an integral part of a complete examination. Limited examinations for reoccurring indications may be performed as noted. The reflux portion of the exam is performed with the patient in reverse Trendelenburg.  +-----+---------------+---------+-----------+----------+--------------+ RIGHTCompressibilityPhasicitySpontaneityPropertiesThrombus Aging +-----+---------------+---------+-----------+----------+--------------+ CFV  Full           Yes      Yes                                 +-----+---------------+---------+-----------+----------+--------------+   +---------+---------------+---------+-----------+----------+--------------+ LEFT     CompressibilityPhasicitySpontaneityPropertiesThrombus Aging +---------+---------------+---------+-----------+----------+--------------+ CFV      Full           Yes      Yes                                 +---------+---------------+---------+-----------+----------+--------------+ SFJ      Full                                                        +---------+---------------+---------+-----------+----------+--------------+ FV Prox  Full                                                        +---------+---------------+---------+-----------+----------+--------------+ FV Mid   Full                                                        +---------+---------------+---------+-----------+----------+--------------+ FV DistalFull                                                        +---------+---------------+---------+-----------+----------+--------------+  PFV      Full                                                        +---------+---------------+---------+-----------+----------+--------------+ POP      Full           Yes      Yes                                  +---------+---------------+---------+-----------+----------+--------------+ PTV      Full                                                        +---------+---------------+---------+-----------+----------+--------------+ PERO     Full                                                        +---------+---------------+---------+-----------+----------+--------------+ Gastroc  Full                                                        +---------+---------------+---------+-----------+----------+--------------+     Summary: RIGHT: - No evidence of common femoral vein obstruction.  LEFT: - There is no evidence of deep vein thrombosis in the lower extremity.  - No cystic structure found in the popliteal fossa.  *See table(s) above for measurements and observations. Electronically signed by Sherald Hess MD on 06/30/2022 at 3:09:36 PM.    Final    ECHOCARDIOGRAM COMPLETE  Result Date: 06/30/2022    ECHOCARDIOGRAM REPORT   Patient Name:   ROMY IPOCK Date of Exam: 06/30/2022 Medical Rec #:  [id number]    Height:       63.6 in Accession #:    [id number]   Weight:       177.0 lb Date of Birth:  26-Nov-1940   BSA:          1.848 m Patient Age:    81 years     BP:           137/61 mmHg Patient Gender: F            HR:           74 bpm. Exam Location:  Inpatient Procedure: 2D Echo, Cardiac Doppler and Color Doppler Indications:    Chest Pain  History:        Patient has no prior history of Echocardiogram examinations and                 Patient has prior history of Echocardiogram examinations, most                 recent 10/25/2016. Risk Factors:Hypertension, Diabetes and                 Dyslipidemia.  Sonographer:  Melissa Morford RDCS (AE, PE) Referring Phys: [id number] Perlie Gold IMPRESSIONS  1. Left ventricular ejection fraction, by estimation, is 60 to 65%. The left ventricle has normal function. The left ventricle has no regional wall motion abnormalities. Left ventricular diastolic parameters are  consistent with Grade I diastolic dysfunction (impaired relaxation).  2. Right ventricular systolic function is normal. The right ventricular size is normal.  3. Left atrial size was mildly dilated.  4. The mitral valve is abnormal. Trivial mitral valve regurgitation. No evidence of mitral stenosis.  5. The aortic valve is tricuspid. There is mild calcification of the aortic valve. There is mild thickening of the aortic valve. Aortic valve regurgitation is not visualized. Aortic valve sclerosis is present, with no evidence of aortic valve stenosis.  6. The inferior vena cava is normal in size with greater than 50% respiratory variability, suggesting right atrial pressure of 3 mmHg. FINDINGS  Left Ventricle: Left ventricular ejection fraction, by estimation, is 60 to 65%. The left ventricle has normal function. The left ventricle has no regional wall motion abnormalities. The left ventricular internal cavity size was normal in size. There is  no left ventricular hypertrophy. Left ventricular diastolic parameters are consistent with Grade I diastolic dysfunction (impaired relaxation). Right Ventricle: The right ventricular size is normal. No increase in right ventricular wall thickness. Right ventricular systolic function is normal. Left Atrium: Left atrial size was mildly dilated. Right Atrium: Right atrial size was normal in size. Pericardium: There is no evidence of pericardial effusion. Mitral Valve: The mitral valve is abnormal. There is mild thickening of the mitral valve leaflet(s). There is moderate calcification of the mitral valve leaflet(s). Trivial mitral valve regurgitation. No evidence of mitral valve stenosis. Tricuspid Valve: The tricuspid valve is normal in structure. Tricuspid valve regurgitation is mild . No evidence of tricuspid stenosis. Aortic Valve: The aortic valve is tricuspid. There is mild calcification of the aortic valve. There is mild thickening of the aortic valve. Aortic valve  regurgitation is not visualized. Aortic valve sclerosis is present, with no evidence of aortic valve stenosis. Pulmonic Valve: The pulmonic valve was normal in structure. Pulmonic valve regurgitation is not visualized. No evidence of pulmonic stenosis. Aorta: The aortic root is normal in size and structure. Venous: The inferior vena cava is normal in size with greater than 50% respiratory variability, suggesting right atrial pressure of 3 mmHg. IAS/Shunts: The interatrial septum appears to be lipomatous. No atrial level shunt detected by color flow Doppler.  LEFT VENTRICLE PLAX 2D LVIDd:         4.60 cm   Diastology LVIDs:         3.20 cm   LV e' medial:    5.22 cm/s LV PW:         1.00 cm   LV E/e' medial:  15.3 LV IVS:        1.00 cm   LV e' lateral:   8.27 cm/s LVOT diam:     1.90 cm   LV E/e' lateral: 9.6 LV SV:         64 LV SV Index:   35 LVOT Area:     2.84 cm  RIGHT VENTRICLE RV S prime:     15.40 cm/s TAPSE (M-mode): 2.5 cm LEFT ATRIUM           Index        RIGHT ATRIUM           Index LA diam:      4.10  cm 2.22 cm/m   RA Area:     20.40 cm LA Vol (A2C): 25.9 ml 14.01 ml/m  RA Volume:   56.70 ml  30.68 ml/m LA Vol (A4C): 53.1 ml 28.73 ml/m  AORTIC VALVE LVOT Vmax:   98.60 cm/s LVOT Vmean:  72.500 cm/s LVOT VTI:    0.227 m  AORTA Ao Root diam: 3.20 cm Ao Asc diam:  3.10 cm MITRAL VALVE                TRICUSPID VALVE MV Area (PHT): 4.00 cm     TR Peak grad:   17.6 mmHg MV E velocity: 79.70 cm/s   TR Vmax:        210.00 cm/s MV A velocity: 108.00 cm/s MV E/A ratio:  0.74         SHUNTS                             Systemic VTI:  0.23 m                             Systemic Diam: 1.90 cm Charlton Haws MD Electronically signed by Charlton Haws MD Signature Date/Time: 06/30/2022/2:13:54 PM    Final    CT Angio Chest PE W and/or Wo Contrast  Result Date: 06/30/2022 CLINICAL DATA:  Chest pain which is sharp and substernal radiating to the esophagus. Resolved with sub lingual nitro. EXAM: CT ANGIOGRAPHY CHEST  WITH CONTRAST TECHNIQUE: Multidetector CT imaging of the chest was performed using the standard protocol during bolus administration of intravenous contrast. Multiplanar CT image reconstructions and MIPs were obtained to evaluate the vascular anatomy. RADIATION DOSE REDUCTION: This exam was performed according to the departmental dose-optimization program which includes automated exposure control, adjustment of the mA and/or kV according to patient size and/or use of iterative reconstruction technique. CONTRAST:  75mL OMNIPAQUE IOHEXOL 350 MG/ML SOLN COMPARISON:  Chest radiograph 06/29/2022 and CTA chest 03/01/2019 FINDINGS: Cardiovascular: Satisfactory opacification of the pulmonary arteries to the segmental level. No evidence of pulmonary embolism. Normal heart size. No pericardial effusion. Coronary artery and aortic atherosclerotic calcification. Mediastinum/Nodes: No enlarged mediastinal, hilar, or axillary lymph nodes. Thyroid gland, trachea, and esophagus demonstrate no significant findings. Lungs/Pleura: Mosaic attenuation of the lungs compatible with air trapping. No focal consolidation, pleural effusion or pneumothorax. Upper Abdomen: No acute abnormality. Mildly nodular hepatic contour suggestive of cirrhosis. Unchanged left hepatic lobe cyst. Cholelithiasis. Musculoskeletal: No chest wall abnormality. No acute osseous findings. Review of the MIP images confirms the above findings. IMPRESSION: 1. No evidence of pulmonary embolism. 2. Mosaic attenuation of the lungs compatible with air trapping. 3. Advanced coronary artery atherosclerosis 4. Cirrhosis. Aortic Atherosclerosis (ICD10-I70.0). Electronically Signed   By: Minerva Fester M.D.   On: 06/30/2022 03:31   DG Chest 2 View  Result Date: 06/29/2022 CLINICAL DATA:  Chest pain EXAM: CHEST - 2 VIEW COMPARISON:  X-ray 10/28/2021 and older FINDINGS: No consolidation, pneumothorax or effusion. Normal cardiopericardial silhouette without edema. Mild  degenerative changes of the spine with some curvature. Aortic calcifications. IMPRESSION: No acute cardiopulmonary disease. Electronically Signed   By: Karen Kays M.D.   On: 06/29/2022 17:38   Disposition   Pt is being discharged home today in good condition.  Follow-up Plans & Appointments     Follow-up Information     Jodelle Gross, NP Follow up on 07/22/2022.   Specialties: Cardiology, Radiology,  Cardiology Why: 8:25AM. Cardiology follow up. Contact information: [address] [place] [address] [phone]                   Discharge Medications   Allergies as of 07/19/2022       Reactions   Influenza Vaccines Swelling   Arm was swollen in 2002    Sitagliptin Swelling   Guaifenesin Other (See Comments)   Unknown   Hydroxychloroquine Other (See Comments)   Eye pain   Latex Other (See Comments)   Unknown   Nitrofurantoin Nausea Only   Other Other (See Comments)   Band-Aid   Phenylephrine-pheniramine-dm Other (See Comments)   Unknown   Ultram [tramadol Hcl] Other (See Comments)   hallucinations   Escitalopram Oxalate Other (See Comments)   Hallucinations        Medication List     STOP taking these medications    lisinopril 20 MG tablet Commonly known as: ZESTRIL       TAKE these medications    acetaminophen 500 MG tablet Commonly known as: TYLENOL Take 500-2,000 mg by mouth every 6 (six) hours as needed for moderate pain (Pain).   amLODipine 5 MG tablet Commonly known as: NORVASC Take 1 tablet (5 mg total) by mouth daily. What changed:  medication strength how much to take   aspirin EC 81 MG tablet Take 1 tablet (81 mg total) by mouth daily. Swallow whole.   atorvastatin 80 MG tablet Commonly known as: LIPITOR Take 1 tablet (80 mg total) by mouth at bedtime.   cetirizine 10 MG tablet Commonly known as: ZYRTEC Take 10 mg by mouth at bedtime as needed for allergies or rhinitis.   CLARITIN PO Take 1  tablet by mouth daily as needed (for allergies).   clopidogrel 75 MG tablet Commonly known as: PLAVIX Take 1 tablet (75 mg total) by mouth daily.   ezetimibe 10 MG tablet Commonly known as: ZETIA Take 1 tablet (10 mg total) by mouth daily. What changed: when to take this   Fish Oil 1000 MG Caps Take 1,000 mg by mouth daily.   glimepiride 4 MG tablet Commonly known as: AMARYL Take 4 mg by mouth 2 (two) times daily.   HumaLOG KwikPen 100 UNIT/ML KwikPen Generic drug: insulin lispro Inject 0-22 Units into the skin 4 (four) times daily.   isosorbide mononitrate 60 MG 24 hr tablet Commonly known as: IMDUR Take 1.5 tablets (90 mg total) by mouth every morning. What changed:  medication strength how much to take when to take this   Magnesium 250 MG Tabs Take 250 mg by mouth daily.   metFORMIN 1000 MG tablet Commonly known as: GLUCOPHAGE Take 1,000 mg by mouth 2 (two) times daily with a meal.   metoprolol succinate 50 MG 24 hr tablet Commonly known as: TOPROL-XL Take 3 tablets (150 mg total) by mouth at bedtime. What changed:  medication strength how much to take when to take this additional instructions   montelukast 10 MG tablet Commonly known as: SINGULAIR Take 10 mg by mouth at bedtime.   nitroGLYCERIN 0.4 MG SL tablet Commonly known as: NITROSTAT Place 1 tablet (0.4 mg total) under the tongue every 5 (five) minutes x 3 doses as needed for chest pain.   pantoprazole 40 MG tablet Commonly known as: PROTONIX Take 40 mg by mouth daily as needed (Acid reflux).   ranolazine 500 MG 12 hr tablet Commonly known as: RANEXA Take 1 tablet (500 mg total) by mouth  2 (two) times daily.   TOUJEO SOLOSTAR [place] Inject 56 Units into the skin at bedtime.   VITAMIN B-12 PO Take 1 tablet by mouth daily.   Vitamin D 50 MCG (2000 UT) tablet Take 1 tablet (2,000 Units total) by mouth daily.           Outstanding Labs/Studies   N/A  Duration of Discharge Encounter    Greater than 30 minutes including physician time.  Ramond Dial, PA 07/19/2022, 4:00 PM

## 2022-07-19 NOTE — Progress Notes (Signed)
Rounding Note    Patient Name: Jill Howard Date of Encounter: 07/19/2022  [facility] Cardiologist: Nanetta Batty, MD   Subjective   Better this morning. Some tingling in her hands after ranexa, but has not had the severe sleepiness/dizziness after we adjusted the timing of her antianginals. Walked 220 ft without angina, had some dyspnea.  Inpatient Medications    Scheduled Meds:  amLODipine  5 mg Oral Daily   aspirin EC  81 mg Oral Daily   atorvastatin  80 mg Oral Daily   clopidogrel  75 mg Oral Daily   ezetimibe  10 mg Oral Daily   heparin  5,000 Units Subcutaneous Q8H   insulin aspart  0-15 Units Subcutaneous TID WC   insulin glargine-yfgn  15 Units Subcutaneous BID   isosorbide mononitrate  90 mg Oral Daily   metoprolol succinate  150 mg Oral QHS   ranolazine  500 mg Oral BID   Continuous Infusions:  PRN Meds: acetaminophen, nitroGLYCERIN, ondansetron (ZOFRAN) IV, mouth rinse   Vital Signs    Vitals:   07/18/22 1411 07/18/22 2042 07/19/22 0518 07/19/22 0843  BP: (!) 125/57 (!) 145/71 (!) 137/55 (!) 139/59  Pulse: 66 67 70   Resp:  20 19   Temp: 98 F (36.7 C) 97.6 F (36.4 C) 98.4 F (36.9 C)   TempSrc: Oral Oral Oral   SpO2:      Weight:      Height:        Intake/Output Summary (Last 24 hours) at 07/19/2022 1024 Last data filed at 07/19/2022 0521 Gross per 24 hour  Intake --  Output 450 ml  Net -450 ml      07/17/2022    2:20 AM 07/16/2022    9:42 PM 07/11/2022    9:48 AM  Last 3 Weights  Weight (lbs) 177 lb 6.4 oz 174 lb 2.6 oz 173 lb  Weight (kg) 80.468 kg 79 kg 78.472 kg      Telemetry    NSR - Personally Reviewed  ECG    No new tracing - Personally Reviewed  Physical Exam  Appears comfortable GEN: No acute distress.   Neck: No JVD Cardiac: RRR, no murmurs, rubs, or gallops.  Respiratory: Clear to auscultation bilaterally. GI: Soft, nontender, non-distended  MS: No edema; No deformity. Neuro:  Nonfocal  Psych:  Normal affect   Labs    High Sensitivity Troponin:   Recent Labs  Lab 07/11/22 1339 07/16/22 2149 07/16/22 2345 07/17/22 0111 07/17/22 0334  TROPONINIHS 4 5 9 12  20*     Chemistry Recent Labs  Lab 07/16/22 2149 07/17/22 0111 07/17/22 0334 07/17/22 0852  NA 134*  --  137  --   K 4.0  --  3.7  --   CL 104  --  104  --   CO2 20*  --  21*  --   GLUCOSE 302*  --  237* 391*  BUN 23  --  18  --   CREATININE 0.88 0.84 0.94  --   CALCIUM 9.3  --  9.3  --   PROT 6.5  --   --   --   ALBUMIN 3.1*  --   --   --   AST 19  --   --   --   ALT 25  --   --   --   ALKPHOS 71  --   --   --   BILITOT 0.8  --   --   --  GFRNONAA >60 >60 >60  --   ANIONGAP 10  --  12  --     Lipids No results for input(s): "CHOL", "TRIG", "HDL", "LABVLDL", "LDLCALC", "CHOLHDL" in the last 168 hours.  Hematology Recent Labs  Lab 07/16/22 2149 07/17/22 0334  WBC 7.2 6.9  RBC 4.01 3.79*  HGB 11.7* 10.9*  HCT 35.9* 33.4*  MCV 89.5 88.1  MCH 29.2 28.8  MCHC 32.6 32.6  RDW 13.1 13.2  PLT 207 199   Thyroid No results for input(s): "TSH", "FREET4" in the last 168 hours.  BNPNo results for input(s): "BNP", "PROBNP" in the last 168 hours.  DDimer No results for input(s): "DDIMER" in the last 168 hours.   Radiology    No results found.  Cardiac Studies    Cath 06/30/2022   Dist RCA lesion is 70% stenosed.   RPAV lesion is 90% stenosed.   Dist LM lesion is 50% stenosed.   Prox Cx lesion is 99% stenosed.   Mid Cx to Dist Cx lesion is 99% stenosed with 99% stenosed side branch in 2nd Mrg.   Mid LAD to Dist LAD lesion is 95% stenosed.   1st Diag lesion is 50% stenosed.   Mid LAD lesion is 80% stenosed.   LV end diastolic pressure is normal.   1.  Mild to moderate distal left main stenosis of 50% 2.  Severe mid LAD stenosis with severe diffuse calcification throughout the mid vessel and stenosis up to 90 to 95% 3.  Subtotal occlusion of the left circumflex with severely diseased obtuse marginal  branches 4.  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  I think she has poor targets for grafting and would initially try aggressive medical therapy and antianginal treatment.  The mid to distal LAD appear to be 2 mm in diameter or less and I do not think that branch is suitable for atherectomy and/or PCI.     Diagnostic Dominance: Right        Echo 06/30/2022   1. Left ventricular ejection fraction, by estimation, is 60 to 65%. The  left ventricle has normal function. The left ventricle has no regional  wall motion abnormalities. Left ventricular diastolic parameters are  consistent with Grade I diastolic  dysfunction (impaired relaxation).   2. Right ventricular systolic function is normal. The right ventricular  size is normal.   3. Left atrial size was mildly dilated.   4. The mitral valve is abnormal. Trivial mitral valve regurgitation. No  evidence of mitral stenosis.   5. The aortic valve is tricuspid. There is mild calcification of the  aortic valve. There is mild thickening of the aortic valve. Aortic valve  regurgitation is not visualized. Aortic valve sclerosis is present, with  no evidence of aortic valve stenosis.   6. The inferior vena cava is normal in size with greater than 50%  respiratory variability, suggesting right atrial pressure of 3 mmHg.   Patient Profile     82 y.o. female with PMH of HTN, HLD, DM II, CVA, fibromyalgia and vascular dementia who was recently cathed on 06/30/2022 which revealed multivessel CAD, medical therapy recommended. However patient returned with recurrent chest pain (negative enzymes), required multiple adjustments to her antianginal meds   Assessment & Plan     [facility] will sign off.   Medication Recommendations:   Ranolazine 500  mg twice daily (try to  avoid higher dose to possible liver disease) Metoprolol succinate 150 mg daily - take in the evening Isosorbide mononitrate 90 mg daily - take in the morning Amlodipine 5 mg daily ASA 81 mg daily Clopidogrel 75 mg daily Atorvastatin 80 mg daily Ezetimibe 10 mg daily Stop lisinopril Other recommendations (labs, testing, etc):  n/a Follow up as an outpatient:  Has appt w Joni Reining, NP this Friday 8:25AM  For questions or updates, please contact [facility] Please consult [email or web address] for contact info under        Signed, Thurmon Fair, MD  07/19/2022, 10:24 AM

## 2022-07-19 NOTE — Progress Notes (Signed)
Mobility Specialist Progress Note:   07/19/22 1110  Mobility  Activity Ambulated independently in hallway  Level of Assistance Independent  Assistive Device None  Distance Ambulated (ft) 520 ft  Activity Response Tolerated well  Mobility Referral Yes  $Mobility charge 1 Mobility  Mobility Specialist Start Time (ACUTE ONLY) 1040  Mobility Specialist Stop Time (ACUTE ONLY) 1055  Mobility Specialist Time Calculation (min) (ACUTE ONLY) 15 min    During Mobility: 91 HR  Post Mobility: 66 HR   Pt received in bed, agreeable to mobility. Denied any feelings of fatigue or SOB. Asymptomatic throughout. Pt returned to bed with family present and call bell in hand.    Jill Howard  Mobility Specialist Please contact via [employer] office at [phone]

## 2022-07-19 NOTE — Plan of Care (Signed)
Problem: Education: Goal: Understanding of CV disease, CV risk reduction, and recovery process will improve Outcome: Adequate for Discharge Goal: Individualized Educational Video(s) Outcome: Adequate for Discharge   Problem: Activity: Goal: Ability to return to baseline activity level will improve Outcome: Adequate for Discharge   Problem: Cardiovascular: Goal: Ability to achieve and maintain adequate cardiovascular perfusion will improve Outcome: Adequate for Discharge Goal: Vascular access site(s) Level 0-1 will be maintained Outcome: Adequate for Discharge   Problem: Health Behavior/Discharge Planning: Goal: Ability to safely manage health-related needs after discharge will improve Outcome: Adequate for Discharge   Problem: Education: Goal: Understanding of cardiac disease, CV risk reduction, and recovery process will improve Outcome: Adequate for Discharge Goal: Individualized Educational Video(s) Outcome: Adequate for Discharge   Problem: Activity: Goal: Ability to tolerate increased activity will improve Outcome: Adequate for Discharge   Problem: Cardiac: Goal: Ability to achieve and maintain adequate cardiovascular perfusion will improve Outcome: Adequate for Discharge   Problem: Health Behavior/Discharge Planning: Goal: Ability to safely manage health-related needs after discharge will improve Outcome: Adequate for Discharge   Problem: Education: Goal: Ability to describe self-care measures that may prevent or decrease complications (Diabetes Survival Skills Education) will improve Outcome: Adequate for Discharge Goal: Individualized Educational Video(s) Outcome: Adequate for Discharge   Problem: Coping: Goal: Ability to adjust to condition or change in health will improve Outcome: Adequate for Discharge   Problem: Fluid Volume: Goal: Ability to maintain a balanced intake and output will improve Outcome: Adequate for Discharge   Problem: Health  Behavior/Discharge Planning: Goal: Ability to identify and utilize available resources and services will improve Outcome: Adequate for Discharge Goal: Ability to manage health-related needs will improve Outcome: Adequate for Discharge   Problem: Metabolic: Goal: Ability to maintain appropriate glucose levels will improve Outcome: Adequate for Discharge   Problem: Nutritional: Goal: Maintenance of adequate nutrition will improve Outcome: Adequate for Discharge Goal: Progress toward achieving an optimal weight will improve Outcome: Adequate for Discharge   Problem: Skin Integrity: Goal: Risk for impaired skin integrity will decrease Outcome: Adequate for Discharge   Problem: Tissue Perfusion: Goal: Adequacy of tissue perfusion will improve Outcome: Adequate for Discharge   Problem: Education: Goal: Knowledge of General Education information will improve Description: Including pain rating scale, medication(s)/side effects and non-pharmacologic comfort measures Outcome: Adequate for Discharge   Problem: Health Behavior/Discharge Planning: Goal: Ability to manage health-related needs will improve Outcome: Adequate for Discharge   Problem: Clinical Measurements: Goal: Ability to maintain clinical measurements within normal limits will improve Outcome: Adequate for Discharge Goal: Will remain free from infection Outcome: Adequate for Discharge Goal: Diagnostic test results will improve Outcome: Adequate for Discharge Goal: Respiratory complications will improve Outcome: Adequate for Discharge Goal: Cardiovascular complication will be avoided Outcome: Adequate for Discharge   Problem: Activity: Goal: Risk for activity intolerance will decrease Outcome: Adequate for Discharge   Problem: Nutrition: Goal: Adequate nutrition will be maintained Outcome: Adequate for Discharge   Problem: Coping: Goal: Level of anxiety will decrease Outcome: Adequate for Discharge   Problem:  Elimination: Goal: Will not experience complications related to bowel motility Outcome: Adequate for Discharge Goal: Will not experience complications related to urinary retention Outcome: Adequate for Discharge   Problem: Pain Managment: Goal: General experience of comfort will improve Outcome: Adequate for Discharge   Problem: Safety: Goal: Ability to remain free from injury will improve Outcome: Adequate for Discharge   Problem: Skin Integrity: Goal: Risk for impaired skin integrity will decrease Outcome: Adequate for Discharge   

## 2022-07-19 NOTE — Inpatient Diabetes Management (Signed)
Inpatient Diabetes Program Recommendations  AACE/ADA: New Consensus Statement on Inpatient Glycemic Control (2015)  Target Ranges:  Prepandial:   less than 140 mg/dL      Peak postprandial:   less than 180 mg/dL (1-2 hours)      Critically ill patients:  140 - 180 mg/dL   Lab Results  Component Value Date   GLUCAP 254 (H) 07/19/2022   HGBA1C 8.7 (H) 06/29/2022    Review of Glycemic Control  Latest Reference Range & Units 07/18/22 07:44 07/18/22 11:43 07/18/22 16:08 07/18/22 20:46 07/19/22 07:13  Glucose-Capillary 70 - 99 mg/dL 604 (H) 540 (H) 981 (H) 230 (H) 254 (H)   Diabetes history: DM 2 Outpatient Diabetes medications: Toujoe 56 units at bedtime, Humalog 0-22 units QID, Metformin 1000 mg BID Amaryl 4 mg BID Current orders for Inpatient glycemic control: Semglee 15 units bid, Novolog 0-15 units tid with meals and HS Inpatient Diabetes Program Recommendations:    May consider further increase of Semglee to 20 units bid and add Novolog meal coverage 4 units tid with meals (hold if patient eats less than 50% or NPO).   Thanks,  Beryl Meager, RN, BC-ADM Inpatient Diabetes Coordinator Pager [phone]  (8a-5p)

## 2022-07-19 NOTE — Progress Notes (Signed)
Discharge education completed with family and family member in room . PIV removed prior to discharge. No further questions from patient or family.

## 2022-07-20 NOTE — Progress Notes (Signed)
Cardiology Clinic Note   Patient Name: Jill Howard Date of Encounter: 07/22/2022  Primary Care Provider:  Trenda Moots, PA-C Primary Cardiologist:  Nanetta Batty, MD  Patient Profile    82 year old female with history of multivessel CAD, hypertension, hyperlipidemia, type 2 diabetes, CVA, with other history to include fibromyalgia and vascular dementia.  Recently hospitalized in the setting of unstable angina from 07/16/2022 through 07/19/2022.  Underwent left heart catheterization revealing complex multivessel CAD, and was treated with medical therapy only due to risk of pursuing coronary intervention.    She was found to have heavily calcified LAD stenosis and not amendable to PCI as it was quite calcified and that the vessel downstream up to stenosis was a small caliber vessel.  Risk of perforation was high.  She did have long discussion with Dr. Tonny Bollman concerning her cardiac catheterization, prognosis, and treatment plan.  He also felt that she was not a good candidate for bypass surgery.    She was started on metoprolol 150 nightly and 90 mg of isosorbide every morning, remained on ranolazine 500 mg twice daily (unable to titrate due to liver disease), continued on DAPT, amlodipine 5 mg daily,  high intensity statin, and lisinopril was discontinued.   Past Medical History    Past Medical History:  Diagnosis Date   Diabetes mellitus without complication (HCC)    Gallstones    Hypertension    Kidney stone    Kidney stone    Stroke (HCC) 10/24/1996   Vertigo    Past Surgical History:  Procedure Laterality Date   ABDOMINAL HYSTERECTOMY     KIDNEY STONE SURGERY     LEFT HEART CATH AND CORONARY ANGIOGRAPHY N/A 06/30/2022   Procedure: LEFT HEART CATH AND CORONARY ANGIOGRAPHY;  Surgeon: Tonny Bollman, MD;  Location: [facility] INVASIVE CV LAB;  Service: Cardiovascular;  Laterality: N/A;    Allergies  Allergies  Allergen Reactions   Influenza Vaccines Swelling    Arm was  swollen in 2002    Sitagliptin Swelling   Guaifenesin Other (See Comments)    Unknown   Hydroxychloroquine Other (See Comments)    Eye pain   Latex Other (See Comments)    Unknown   Nitrofurantoin Nausea Only   Other Other (See Comments)    Band-Aid   Phenylephrine-Pheniramine-Dm Other (See Comments)    Unknown   Ultram [Tramadol Hcl] Other (See Comments)    hallucinations   Escitalopram Oxalate Other (See Comments)    Hallucinations    History of Present Illness    Jill Howard is here for [facility] follow-up after admission for unstable angina 2 weeks ago.  The patient had cardiac catheterization with severely stenosed LAD and severe multivessel coronary artery disease.  Due to risk of intervention she is to be treated medically.  She was unable to be titrated up on Ranexa due to liver disease, she was started on Imdur in the evening, metoprolol was increased to 150 mg in the evening, and she was started on amlodipine 5 mg daily along with ongoing DAPT and statin therapy.  Lisinopril was discontinued.  She is here today with her daughter-in-law.  She is denying any recurrent symptoms except for once she felt a little bit of discomfort in her abdomen when she was walking a lot in her hide her anginal equivalent is epigastric substernal abdominal and lower chest discomfort.  She denies any shortness of breath, dizziness, or palpitations.  She is a little frustrated that she is not able to do  strenuous things right now.  She is not having any significant side effects from the vasodilators with the exception of some mild lower extremity edema.  She is tolerating medications prescribed.  Home Medications    Current Outpatient Medications  Medication Sig Dispense Refill   acetaminophen (TYLENOL) 500 MG tablet Take 500-2,000 mg by mouth every 6 (six) hours as needed for moderate pain (Pain).     aspirin EC 81 MG tablet Take 1 tablet (81 mg total) by mouth daily. Swallow whole. 30 tablet 1    cetirizine (ZYRTEC) 10 MG tablet Take 10 mg by mouth at bedtime as needed for allergies or rhinitis.     Cholecalciferol (VITAMIN D) 50 MCG (2000 UT) tablet Take 1 tablet (2,000 Units total) by mouth daily.     Cyanocobalamin (VITAMIN B-12 PO) Take 1 tablet by mouth daily.     glimepiride (AMARYL) 4 MG tablet Take 4 mg by mouth 2 (two) times daily.     HUMALOG KWIKPEN 100 UNIT/ML KwikPen Inject 0-22 Units into the skin 4 (four) times daily.     Insulin Glargine (TOUJEO SOLOSTAR [place]) Inject 56 Units into the skin at bedtime.     Loratadine (CLARITIN PO) Take 1 tablet by mouth daily as needed (for allergies).     Magnesium 250 MG TABS Take 250 mg by mouth daily.     metFORMIN (GLUCOPHAGE) 1000 MG tablet Take 1,000 mg by mouth 2 (two) times daily with a meal.     montelukast (SINGULAIR) 10 MG tablet Take 10 mg by mouth at bedtime.     Omega-3 Fatty Acids (FISH OIL) 1000 MG CAPS Take 1,000 mg by mouth daily.      pantoprazole (PROTONIX) 40 MG tablet Take 40 mg by mouth daily as needed (Acid reflux).     ranolazine (RANEXA) 500 MG 12 hr tablet Take 1 tablet (500 mg total) by mouth 2 (two) times daily. 60 tablet 5   amLODipine (NORVASC) 5 MG tablet Take 1 tablet (5 mg total) by mouth daily. 60 tablet 1   atorvastatin (LIPITOR) 80 MG tablet Take 1 tablet (80 mg total) by mouth at bedtime. 60 tablet 3   clopidogrel (PLAVIX) 75 MG tablet Take 1 tablet (75 mg total) by mouth daily. 60 tablet 4   ezetimibe (ZETIA) 10 MG tablet Take 1 tablet (10 mg total) by mouth daily. 60 tablet 3   isosorbide mononitrate (IMDUR) 60 MG 24 hr tablet Take 1.5 tablets (90 mg total) by mouth every morning. 90 tablet 2   metoprolol succinate (TOPROL-XL) 50 MG 24 hr tablet Take 3 tablets (150 mg total) by mouth at bedtime. 270 tablet 5   nitroGLYCERIN (NITROSTAT) 0.4 MG SL tablet Place 1 tablet (0.4 mg total) under the tongue every 5 (five) minutes x 3 doses as needed for chest pain. (Patient not taking: Reported on 07/22/2022) 25  tablet 3   No current facility-administered medications for this visit.     Family History    Family History  Problem Relation Age of Onset   Stroke Other    Heart attack Other    Cancer Other    Diabetes Other    Stroke Mother    Heart attack Mother    Stroke Maternal Aunt    Stroke Paternal Aunt    Rheum arthritis Paternal Aunt    Stroke Brother    Heart attack Brother    COPD Brother    Rheum arthritis Paternal Aunt    Diabetes Son  Heart disease Son    Diabetes Daughter    She indicated that her mother is deceased. She indicated that her father is deceased. She indicated that her brother is deceased. She indicated that her daughter is alive. She indicated that her son is alive. She indicated that her maternal aunt is deceased. She indicated that both of her paternal aunts are deceased. She indicated that the status of her other is unknown.  Social History    Social History   Socioeconomic History   Marital status: Married    Spouse name: Not on file   Number of children: Not on file   Years of education: Not on file   Highest education level: Not on file  Occupational History   Not on file  Tobacco Use   Smoking status: Former    Types: Cigarettes    Quit date: 5    Years since quitting: 41.5   Smokeless tobacco: Never  Vaping Use   Vaping Use: Never used  Substance and Sexual Activity   Alcohol use: No   Drug use: No   Sexual activity: Not Currently  Other Topics Concern   Not on file  Social History Narrative   Not on file   Social Determinants of Health   Financial Resource Strain: Not on file  Food Insecurity: No Food Insecurity (07/17/2022)   Hunger Vital Sign    Worried About Running Out of Food in the Last Year: Never true    Ran Out of Food in the Last Year: Never true  Transportation Needs: No Transportation Needs (07/17/2022)   PRAPARE - Administrator, Civil Service (Medical): No    Lack of Transportation (Non-Medical):  No  Physical Activity: Not on file  Stress: Not on file  Social Connections: Not on file  Intimate Partner Violence: Not At Risk (07/17/2022)   Humiliation, Afraid, Rape, and Kick questionnaire    Fear of Current or Ex-Partner: No    Emotionally Abused: No    Physically Abused: No    Sexually Abused: No     Review of Systems    General:  No chills, fever, night sweats or weight changes.  Cardiovascular:  No chest pain, dyspnea on exertion, edema, orthopnea, palpitations, paroxysmal nocturnal dyspnea. Dermatological: No rash, lesions/masses Respiratory: No cough, dyspnea Urologic: No hematuria, dysuria Abdominal:   No nausea, vomiting, diarrhea, bright red blood per rectum, melena, or hematemesis Neurologic:  No visual changes, wkns, changes in mental status. All other systems reviewed and are otherwise negative except as noted above.     Physical Exam    VS:  BP 124/84   Pulse 68   Ht 5\' 3"  (1.6 m)   Wt 174 lb 9.6 oz (79.2 kg)   SpO2 96%   BMI 30.93 kg/m  , BMI Body mass index is 30.93 kg/m.     GEN: Well nourished, well developed, in no acute distress. HEENT: normal. Neck: Supple, no JVD, carotid bruits, or masses. Cardiac: RRR, distant heart sounds, no murmurs, rubs, or gallops. No clubbing, cyanosis, mild dependent edema, with more edema chronically on the left lower extremity.  Radials/DP/PT 2+ and equal bilaterally.  Respiratory:  Respirations regular and unlabored, clear to auscultation bilaterally. GI: Soft, nontender, nondistended, BS + x 4. MS: no deformity or atrophy. Skin: warm and dry, no rash. Neuro:  Strength and sensation are intact. Psych: Normal affect.  Accessory Clinical Findings     EKG: Normal sinus rhythm, ventricular rate 68 bpm.  (Personally  reviewed). Lab Results  Component Value Date   WBC 6.9 07/17/2022   HGB 10.9 (L) 07/17/2022   HCT 33.4 (L) 07/17/2022   MCV 88.1 07/17/2022   PLT 199 07/17/2022   Lab Results  Component Value Date    CREATININE 0.94 07/17/2022   BUN 18 07/17/2022   NA 137 07/17/2022   K 3.7 07/17/2022   CL 104 07/17/2022   CO2 21 (L) 07/17/2022   Lab Results  Component Value Date   ALT 25 07/16/2022   AST 19 07/16/2022   ALKPHOS 71 07/16/2022   BILITOT 0.8 07/16/2022   Lab Results  Component Value Date   CHOL 150 12/24/2019   HDL 47 12/24/2019   LDLCALC 83 12/24/2019   TRIG 109 12/24/2019   CHOLHDL 3.2 12/24/2019    Lab Results  Component Value Date   HGBA1C 8.7 (H) 06/29/2022    Review of Prior Studies    [facility] 06/30/2022 Dist RCA lesion is 70% stenosed.   RPAV lesion is 90% stenosed.   Dist LM lesion is 50% stenosed.   Prox Cx lesion is 99% stenosed.   Mid Cx to Dist Cx lesion is 99% stenosed with 99% stenosed side branch in 2nd Mrg.   Mid LAD to Dist LAD lesion is 95% stenosed.   1st Diag lesion is 50% stenosed.   Mid LAD lesion is 80% stenosed.   LV end diastolic pressure is normal.   1.  Mild to moderate distal left main stenosis of 50% 2.  Severe mid LAD stenosis with severe diffuse calcification throughout the mid vessel and stenosis up to 90 to 95% 3.  Subtotal occlusion of the left circumflex with severely diseased obtuse marginal branches 4.  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  I think she has poor targets for grafting and would initially try aggressive medical therapy and antianginal treatment.  The mid to distal LAD appear to be 2 mm in diameter or less and I do not think that branch is suitable for atherectomy and/or PCI.    Assessment & Plan   1.  Coronary artery disease: Severe LAD stenosis with severe diffuse calcification throughout the mid vessel and stenosis up to 90-95%.  With moderately severe RCA stenosis at the ostium of the posterior AV branch.  The patient is being treated medically and  is a high risk patient for intervention or CABG.  She remains on vasodilators ranolazine 500 mg (not to be titrated), amlodipine 5 mg daily, and isosorbide 90 mg daily.  Also remains on metoprolol 150 mg daily.  Tolerating all of this without complaints of dizziness or fatigue.  She is taking it easy but is not turning into a cardiac cripple.  She is remaining as active as she can be and less she has discomfort.  Will see her again in 1 month and then probably 3 months intervals for ongoing evaluation.  2.  Hypercholesterolemia: Continues on atorvastatin 80 mg daily and Zetia 10 mg.  No complaints of myalgias.  She will need follow-up lipids and LFTs in 3 months.  3.  Hypertension: Doing well on current medication regimen.  She keeps up with her blood pressure at home as well and it is correlating.  4.  History of CVA: Continue clopidogrel 75 mg daily as directed.  No complaints of bleeding or excessive bruising currently.  Signed, Bettey Mare. Liborio Nixon, ANP, AACC   07/22/2022 9:55 AM      Office [phone] Fax [phone]  Notice: This dictation was prepared with Dragon dictation along with smaller phrase technology. Any transcriptional errors that result from this process are unintentional and may not be corrected upon review.

## 2022-07-22 ENCOUNTER — Ambulatory Visit: Payer: Medicare Other | Attending: Adult Health | Admitting: Adult Health

## 2022-07-22 ENCOUNTER — Encounter: Payer: Self-pay | Admitting: Adult Health

## 2022-07-22 VITALS — BP 124/61 | HR 68 | Ht 63.0 in | Wt 174.6 lb

## 2022-07-22 DIAGNOSIS — E7800 Pure hypercholesterolemia, unspecified: Secondary | ICD-10-CM | POA: Insufficient documentation

## 2022-07-22 DIAGNOSIS — I2510 Atherosclerotic heart disease of native coronary artery without angina pectoris: Secondary | ICD-10-CM | POA: Insufficient documentation

## 2022-07-22 DIAGNOSIS — I6389 Other cerebral infarction: Secondary | ICD-10-CM | POA: Insufficient documentation

## 2022-07-22 DIAGNOSIS — I10 Essential (primary) hypertension: Secondary | ICD-10-CM | POA: Insufficient documentation

## 2022-07-22 MED ORDER — AMLODIPINE BESYLATE 5 MG PO TABS: 5.0000 mg | ORAL_TABLET | Freq: Every day | ORAL | 1 refills | Status: DC

## 2022-07-22 MED ORDER — CLOPIDOGREL BISULFATE 75 MG PO TABS: 75.0000 mg | ORAL_TABLET | Freq: Every day | ORAL | 4 refills | Status: DC

## 2022-07-22 MED ORDER — ATORVASTATIN CALCIUM 80 MG PO TABS: 80.0000 mg | ORAL_TABLET | Freq: Every day | ORAL | 3 refills | Status: DC

## 2022-07-22 MED ORDER — ISOSORBIDE MONONITRATE ER 60 MG PO TB24: 90.0000 mg | ORAL_TABLET | Freq: Every morning | ORAL | 2 refills | Status: DC

## 2022-07-22 MED ORDER — METOPROLOL SUCCINATE ER 50 MG PO TB24: 150.0000 mg | ORAL_TABLET | Freq: Every day | ORAL | 5 refills | Status: DC

## 2022-07-22 MED ORDER — EZETIMIBE 10 MG PO TABS: 10.0000 mg | ORAL_TABLET | Freq: Every day | ORAL | 3 refills | Status: DC

## 2022-07-22 NOTE — Patient Instructions (Addendum)
Medication Instructions:  No Changes *If you need a refill on your cardiac medications before your next appointment, please call your pharmacy*   Lab Work: No Labs If you have labs (blood work) drawn today and your tests are completely normal, you will receive your results only by: MyChart Message (if you have MyChart) OR A paper copy in the mail If you have any lab test that is abnormal or we need to change your treatment, we will call you to review the results.   Testing/Procedures: No testing   Follow-Up: At [facility], you and your health needs are our priority.  As part of our continuing mission to provide you with exceptional heart care, we have created designated Provider Care Teams.  These Care Teams include your primary Cardiologist (physician) and Advanced Practice Providers (APPs -  Physician Assistants and Nurse Practitioners) who all work together to provide you with the care you need, when you need it.  We recommend signing up for the patient portal called "MyChart".  Sign up information is provided on this After Visit Summary.  MyChart is used to connect with patients for Virtual Visits (Telemedicine).  Patients are able to view lab/test results, encounter notes, upcoming appointments, etc.  Non-urgent messages can be sent to your provider as well.   To learn more about what you can do with MyChart, go to [email or web address].    Your next appointment:   1 month(s)  Provider:   Joni Reining, DNP, ANP  or, Nanetta Batty, MD

## 2022-08-16 NOTE — Progress Notes (Signed)
Cardiology Clinic Note   Patient Name: Jill Howard Date of Encounter: 08/22/2022  Primary Care Provider:  Trenda Moots, PA-C Primary Cardiologist:  Nanetta Batty, MD  Patient Profile    82 y.o. female with PMHx of multivessel CAD, hypertension, hyperlipidemia, fibromyalgia, vascular dementia, type 2 diabetes, CVA  with recent admission to [facility] hospital 07/16/2022 -6-25/2024 in the setting of unstable angina.  Underwent left heart cath on 06/29/2022 was noted to have complex multivessel CAD.  Due to the risk of pursuing coronary intervention it was decided among the cardiologist to do a trial of medical therapy first.  The patient returned to the hospital on 07/16/2022 for recurrent angina symptoms.  The patient medications were adjusted metoprolol was increased to 150 mg at at bedtime, isosorbide was increased to 90 mg in the a.m., amlodipine was increased to 5 mg daily, she was continued on aspirin 81 mg daily clopidogrel 75 mg daily, atorvastatin 80 mg daily and Zetia 10 mg daily.  Lisinopril was discontinued.  Past Medical History    Past Medical History:  Diagnosis Date   Diabetes mellitus without complication (HCC)    Gallstones    Hypertension    Kidney stone    Kidney stone    Stroke (HCC) 10/24/1996   Vertigo    Past Surgical History:  Procedure Laterality Date   ABDOMINAL HYSTERECTOMY     KIDNEY STONE SURGERY     LEFT HEART CATH AND CORONARY ANGIOGRAPHY N/A 06/30/2022   Procedure: LEFT HEART CATH AND CORONARY ANGIOGRAPHY;  Surgeon: Tonny Bollman, MD;  Location: [facility] INVASIVE CV LAB;  Service: Cardiovascular;  Laterality: N/A;    Allergies  Allergies  Allergen Reactions   Influenza Vaccines Swelling    Arm was swollen in 2002    Sitagliptin Swelling   Guaifenesin Other (See Comments)    Unknown   Hydroxychloroquine Other (See Comments)    Eye pain   Latex Other (See Comments)    Unknown   Nitrofurantoin Nausea Only   Other Other (See Comments)    Band-Aid    Phenylephrine-Pheniramine-Dm Other (See Comments)    Unknown   Ultram [Tramadol Hcl] Other (See Comments)    hallucinations   Escitalopram Oxalate Other (See Comments)    Hallucinations    History of Present Illness    Jill Howard is here today for posthospitalization follow-up after recurrent admission for unstable angina on 07/16/2022.  Medications were adjusted as listed above.  She is here to evaluate her response to medical management.  Today she states she did not sleep well last night.  Prior to going to bed she had her blood sugar checked and it was 41, the patient began to have heart racing and did take 1 nitroglycerin.  After several minutes her heart rate returned to more normal.  And she fell asleep.  About 4 AM she awoke and did have some palpitations but did not not take any extra nitroglycerin.  She denied any chest pain she did have some mild shortness of breath associated with elevated heart rate.  She states that she continues to have chest discomfort walking around 100 feet.  She has had to take nitroglycerin a couple times a week when she goes to visit a friend in a nursing home, because she she has to stop going from the parking lot to the patient's room about halfway to catch her breath and sometimes having chest pressure.   Home Medications    Current Outpatient Medications  Medication Sig Dispense Refill  acetaminophen (TYLENOL) 500 MG tablet Take 500-2,000 mg by mouth every 6 (six) hours as needed for moderate pain (Pain).     aspirin EC 81 MG tablet Take 1 tablet (81 mg total) by mouth daily. Swallow whole. 30 tablet 1   cetirizine (ZYRTEC) 10 MG tablet Take 10 mg by mouth at bedtime as needed for allergies or rhinitis.     Cholecalciferol (VITAMIN D) 50 MCG (2000 UT) tablet Take 1 tablet (2,000 Units total) by mouth daily.     Cyanocobalamin (VITAMIN B-12 PO) Take 1 tablet by mouth daily.     glimepiride (AMARYL) 4 MG tablet Take 4 mg by mouth 2 (two) times daily.      HUMALOG KWIKPEN 100 UNIT/ML KwikPen Inject 0-22 Units into the skin 4 (four) times daily.     Insulin Glargine (TOUJEO SOLOSTAR [place]) Inject 56 Units into the skin at bedtime.     Loratadine (CLARITIN PO) Take 1 tablet by mouth daily as needed (for allergies).     Magnesium 250 MG TABS Take 250 mg by mouth daily.     metFORMIN (GLUCOPHAGE) 1000 MG tablet Take 1,000 mg by mouth 2 (two) times daily with a meal.     montelukast (SINGULAIR) 10 MG tablet Take 10 mg by mouth at bedtime.     Omega-3 Fatty Acids (FISH OIL) 1000 MG CAPS Take 1,000 mg by mouth daily.      pantoprazole (PROTONIX) 40 MG tablet Take 40 mg by mouth daily as needed (Acid reflux).     ranolazine (RANEXA) 500 MG 12 hr tablet Take 1 tablet (500 mg total) by mouth 2 (two) times daily. 60 tablet 5   amLODipine (NORVASC) 5 MG tablet Take 1 tablet (5 mg total) by mouth daily. 90 tablet 2   atorvastatin (LIPITOR) 80 MG tablet Take 1 tablet (80 mg total) by mouth at bedtime. 90 tablet 3   clopidogrel (PLAVIX) 75 MG tablet Take 1 tablet (75 mg total) by mouth daily. 90 tablet 3   ezetimibe (ZETIA) 10 MG tablet Take 1 tablet (10 mg total) by mouth daily. 90 tablet 3   isosorbide mononitrate (IMDUR) 60 MG 24 hr tablet Take 1.5 tablets (90 mg total) by mouth every morning. 90 tablet 2   metoprolol succinate (TOPROL-XL) 50 MG 24 hr tablet Take 3 tablets (150 mg total) by mouth at bedtime. 270 tablet 5   nitroGLYCERIN (NITROSTAT) 0.4 MG SL tablet Place 1 tablet (0.4 mg total) under the tongue every 5 (five) minutes x 3 doses as needed for chest pain. 25 tablet 3   No current facility-administered medications for this visit.     Family History    Family History  Problem Relation Age of Onset   Stroke Other    Heart attack Other    Cancer Other    Diabetes Other    Stroke Mother    Heart attack Mother    Stroke Maternal Aunt    Stroke Paternal Aunt    Rheum arthritis Paternal Aunt    Stroke Brother    Heart attack Brother     COPD Brother    Rheum arthritis Paternal Aunt    Diabetes Son    Heart disease Son    Diabetes Daughter    She indicated that her mother is deceased. She indicated that her father is deceased. She indicated that her brother is deceased. She indicated that her daughter is alive. She indicated that her son is alive. She indicated that her maternal aunt  is deceased. She indicated that both of her paternal aunts are deceased. She indicated that the status of her other is unknown.  Social History    Social History   Socioeconomic History   Marital status: Married    Spouse name: Not on file   Number of children: Not on file   Years of education: Not on file   Highest education level: Not on file  Occupational History   Not on file  Tobacco Use   Smoking status: Former    Current packs/day: 0.00    Types: Cigarettes    Quit date: 69    Years since quitting: 41.6   Smokeless tobacco: Never  Vaping Use   Vaping status: Never Used  Substance and Sexual Activity   Alcohol use: No   Drug use: No   Sexual activity: Not Currently  Other Topics Concern   Not on file  Social History Narrative   Not on file   Social Determinants of Health   Financial Resource Strain: Not on file  Food Insecurity: No Food Insecurity (07/17/2022)   Hunger Vital Sign    Worried About Running Out of Food in the Last Year: Never true    Ran Out of Food in the Last Year: Never true  Transportation Needs: No Transportation Needs (07/17/2022)   PRAPARE - Administrator, Civil Service (Medical): No    Lack of Transportation (Non-Medical): No  Physical Activity: Not on file  Stress: Not on file  Social Connections: Unknown (06/06/2021)   Received from [facility]   Social Network    Social Network: Not on file  Intimate Partner Violence: Not At Risk (07/17/2022)   Humiliation, Afraid, Rape, and Kick questionnaire    Fear of Current or Ex-Partner: No    Emotionally Abused: No    Physically  Abused: No    Sexually Abused: No     Review of Systems    General:  No chills, fever, night sweats or weight changes.  Cardiovascular: Positive for chest pain, positive for dyspnea on exertion, positive for elevated heart rate with hypoglycemia, no significant edema, orthopnea, palpitations, paroxysmal nocturnal dyspnea. Dermatological: No rash, lesions/masses Respiratory: No cough, dyspnea Urologic: No hematuria, dysuria Abdominal:   No nausea, vomiting, diarrhea, bright red blood per rectum, melena, or hematemesis Neurologic:  No visual changes, wkns, changes in mental status. All other systems reviewed and are otherwise negative except as noted above.       Physical Exam    VS:  BP (!) 120/58 (BP Location: Left Arm, Patient Position: Sitting, Cuff Size: Normal)   Pulse 64   Ht 5' 3.5" (1.613 m)   Wt 172 lb 12.8 oz (78.4 kg)   SpO2 97%   BMI 30.13 kg/m  , BMI Body mass index is 30.13 kg/m.     GEN: Well nourished, well developed, in no acute distress. HEENT: normal. Neck: Supple, no JVD, carotid bruits, or masses. Cardiac: RRR, no murmurs, rubs, or gallops. No clubbing, cyanosis, nonpitting bilateral lower extremity edema.  Radials/DP/PT 2+ and equal bilaterally.  Respiratory:  Respirations regular and unlabored, clear to auscultation bilaterally. GI: Soft, nontender, nondistended, BS + x 4. MS: no deformity or atrophy. Skin: warm and dry, no rash. Neuro:  Strength and sensation are intact. Psych: Normal affect.      Lab Results  Component Value Date   WBC 6.9 07/17/2022   HGB 10.9 (L) 07/17/2022   HCT 33.4 (L) 07/17/2022   MCV 88.1 07/17/2022  PLT 199 07/17/2022   Lab Results  Component Value Date   CREATININE 0.94 07/17/2022   BUN 18 07/17/2022   NA 137 07/17/2022   K 3.7 07/17/2022   CL 104 07/17/2022   CO2 21 (L) 07/17/2022   Lab Results  Component Value Date   ALT 25 07/16/2022   AST 19 07/16/2022   ALKPHOS 71 07/16/2022   BILITOT 0.8 07/16/2022    Lab Results  Component Value Date   CHOL 150 12/24/2019   HDL 47 12/24/2019   LDLCALC 83 12/24/2019   TRIG 109 12/24/2019   CHOLHDL 3.2 12/24/2019    Lab Results  Component Value Date   HGBA1C 8.7 (H) 06/29/2022     Review of Prior Studies    [facility] 06/30/2022-Dr.Cooper  Dist RCA lesion is 70% stenosed.   RPAV lesion is 90% stenosed.   Dist LM lesion is 50% stenosed.   Prox Cx lesion is 99% stenosed.   Mid Cx to Dist Cx lesion is 99% stenosed with 99% stenosed side branch in 2nd Mrg.   Mid LAD to Dist LAD lesion is 95% stenosed.   1st Diag lesion is 50% stenosed.   Mid LAD lesion is 80% stenosed.   LV end diastolic pressure is normal.   1.  Mild to moderate distal left main stenosis of 50% 2.  Severe mid LAD stenosis with severe diffuse calcification throughout the mid vessel and stenosis up to 90 to 95% 3.  Subtotal occlusion of the left circumflex with severely diseased obtuse marginal branches 4.  Moderately severe distal RCA stenosis with severe stenosis at the ostium of the posterior AV branch  The patient has very severe diffuse coronary calcification with severe multivessel CAD.  I think she has poor targets for grafting and would initially try aggressive medical therapy and antianginal treatment.  The mid to distal LAD appear to be 2 mm in diameter or less and I do not think that branch is suitable for atherectomy and/or PCI.    Assessment & Plan   1.  Multivessel CAD: Treated medically only.  She has been placed on isosorbide mononitrate 90 mg daily.  I have offered to increase this dose but she is only taking the nitroglycerin sublingual a couple times a week with walking distances greater than 100 250 feet.  She does get tired a lot walking around and does rest often.  NYHA Class II-III.  For now we will continue to monitor if she continues to have to use nitroglycerin at least once a day we would probably increase her isosorbide from 90 mg up to 120 mg daily and adjust  antihypertensives to prevent hypotension.  She will continue on DAPT and statin therapy.    I have also filled out a disability parking placard application for her to take to the [employer] in order for her to have a disability parking placard.  2.  Hypertension: Blood pressure is well-controlled currently.  Would not make any changes on her medications at this time unless she becomes symptomatic concerning chest discomfort.  Which time adjustments will be made.  We have made a note in the chart that she prefers 90-day supply of her medications.  3.  Type 2 diabetes: She is insulin-dependent with hypoglycemia in the evening.  She is advised to see her PCP for adjustment in dosing.  She states that she was recommended to be on Jardiance but she is uncomfortable with using that now until her blood sugar is better controlled.  4.Hyperlipemia: Continue Zetia and atorvastatin as directed.  Follow-up lipids and LFTs in 3 months.         Signed, Bettey Mare. Liborio Nixon, ANP, AACC   08/22/2022 10:12 AM      Office [phone] Fax [phone]  Notice: This dictation was prepared with Dragon dictation along with smaller phrase technology. Any transcriptional errors that result from this process are unintentional and may not be corrected upon review.

## 2022-08-22 ENCOUNTER — Ambulatory Visit: Payer: Medicare Other | Admitting: Adult Health

## 2022-08-22 ENCOUNTER — Encounter: Payer: Self-pay | Admitting: Adult Health

## 2022-08-22 VITALS — BP 120/58 | HR 64 | Ht 63.5 in | Wt 172.8 lb

## 2022-08-22 DIAGNOSIS — E7800 Pure hypercholesterolemia, unspecified: Secondary | ICD-10-CM

## 2022-08-22 DIAGNOSIS — I2510 Atherosclerotic heart disease of native coronary artery without angina pectoris: Secondary | ICD-10-CM | POA: Diagnosis present

## 2022-08-22 DIAGNOSIS — E118 Type 2 diabetes mellitus with unspecified complications: Secondary | ICD-10-CM

## 2022-08-22 DIAGNOSIS — I10 Essential (primary) hypertension: Secondary | ICD-10-CM

## 2022-08-22 MED ORDER — ISOSORBIDE MONONITRATE ER 60 MG PO TB24: 90.0000 mg | ORAL_TABLET | Freq: Every morning | ORAL | 2 refills | Status: DC

## 2022-08-22 MED ORDER — METOPROLOL SUCCINATE ER 50 MG PO TB24: 150.0000 mg | ORAL_TABLET | Freq: Every day | ORAL | 5 refills | Status: DC

## 2022-08-22 MED ORDER — EZETIMIBE 10 MG PO TABS: 10.0000 mg | ORAL_TABLET | Freq: Every day | ORAL | 3 refills | Status: AC

## 2022-08-22 MED ORDER — ATORVASTATIN CALCIUM 80 MG PO TABS: 80.0000 mg | ORAL_TABLET | Freq: Every day | ORAL | 3 refills | Status: AC

## 2022-08-22 MED ORDER — NITROGLYCERIN 0.4 MG SL SUBL: 0.4000 mg | SUBLINGUAL_TABLET | SUBLINGUAL | 3 refills | Status: AC | PRN

## 2022-08-22 MED ORDER — CLOPIDOGREL BISULFATE 75 MG PO TABS: 75.0000 mg | ORAL_TABLET | Freq: Every day | ORAL | 3 refills | Status: AC

## 2022-08-22 MED ORDER — ATORVASTATIN CALCIUM 80 MG PO TABS: 80.0000 mg | ORAL_TABLET | Freq: Every day | ORAL | 3 refills | Status: DC

## 2022-08-22 MED ORDER — AMLODIPINE BESYLATE 5 MG PO TABS: 5.0000 mg | ORAL_TABLET | Freq: Every day | ORAL | 2 refills | Status: DC

## 2022-08-22 NOTE — Patient Instructions (Signed)
Medication Instructions:  No Changes *If you need a refill on your cardiac medications before your next appointment, please call your pharmacy*   Lab Work: No Labs If you have labs (blood work) drawn today and your tests are completely normal, you will receive your results only by: MyChart Message (if you have MyChart) OR A paper copy in the mail If you have any lab test that is abnormal or we need to change your treatment, we will call you to review the results.   Testing/Procedures: No Testing   Follow-Up: At [facility], you and your health needs are our priority.  As part of our continuing mission to provide you with exceptional heart care, we have created designated Provider Care Teams.  These Care Teams include your primary Cardiologist (physician) and Advanced Practice Providers (APPs -  Physician Assistants and Nurse Practitioners) who all work together to provide you with the care you need, when you need it.  We recommend signing up for the patient portal called "MyChart".  Sign up information is provided on this After Visit Summary.  MyChart is used to connect with patients for Virtual Visits (Telemedicine).  Patients are able to view lab/test results, encounter notes, upcoming appointments, etc.  Non-urgent messages can be sent to your provider as well.   To learn more about what you can do with MyChart, go to [email or web address].    Your next appointment:   6 month(s)  Provider:   Jonathan Berry, MD     

## 2022-09-06 ENCOUNTER — Other Ambulatory Visit: Payer: Self-pay

## 2022-09-06 ENCOUNTER — Encounter (HOSPITAL_COMMUNITY): Payer: Self-pay | Admitting: Cardiology

## 2022-09-06 ENCOUNTER — Emergency Department (HOSPITAL_COMMUNITY): Payer: Medicare Other

## 2022-09-06 ENCOUNTER — Observation Stay (HOSPITAL_COMMUNITY)
Admission: EM | Admit: 2022-09-06 | Discharge: 2022-09-07 | Disposition: A | Discharge status: Home / Self Care | Payer: Medicare Other | Attending: Internal Medicine | Admitting: Internal Medicine

## 2022-09-06 DIAGNOSIS — I200 Unstable angina: Principal | ICD-10-CM | POA: Diagnosis present

## 2022-09-06 DIAGNOSIS — E669 Obesity, unspecified: Secondary | ICD-10-CM | POA: Diagnosis present

## 2022-09-06 DIAGNOSIS — Z8673 Personal history of transient ischemic attack (TIA), and cerebral infarction without residual deficits: Secondary | ICD-10-CM | POA: Insufficient documentation

## 2022-09-06 DIAGNOSIS — R531 Weakness: Secondary | ICD-10-CM | POA: Diagnosis present

## 2022-09-06 DIAGNOSIS — E119 Type 2 diabetes mellitus without complications: Secondary | ICD-10-CM | POA: Insufficient documentation

## 2022-09-06 DIAGNOSIS — Z87891 Personal history of nicotine dependence: Secondary | ICD-10-CM | POA: Diagnosis not present

## 2022-09-06 DIAGNOSIS — Z7982 Long term (current) use of aspirin: Secondary | ICD-10-CM | POA: Diagnosis not present

## 2022-09-06 DIAGNOSIS — Z79899 Other long term (current) drug therapy: Secondary | ICD-10-CM | POA: Insufficient documentation

## 2022-09-06 DIAGNOSIS — Z7984 Long term (current) use of oral hypoglycemic drugs: Secondary | ICD-10-CM | POA: Insufficient documentation

## 2022-09-06 DIAGNOSIS — I639 Cerebral infarction, unspecified: Secondary | ICD-10-CM | POA: Diagnosis present

## 2022-09-06 DIAGNOSIS — Z7902 Long term (current) use of antithrombotics/antiplatelets: Secondary | ICD-10-CM | POA: Diagnosis not present

## 2022-09-06 DIAGNOSIS — E785 Hyperlipidemia, unspecified: Secondary | ICD-10-CM | POA: Diagnosis present

## 2022-09-06 DIAGNOSIS — I25110 Atherosclerotic heart disease of native coronary artery with unstable angina pectoris: Secondary | ICD-10-CM | POA: Diagnosis not present

## 2022-09-06 DIAGNOSIS — I10 Essential (primary) hypertension: Secondary | ICD-10-CM | POA: Diagnosis not present

## 2022-09-06 DIAGNOSIS — F0390 Unspecified dementia without behavioral disturbance: Secondary | ICD-10-CM | POA: Insufficient documentation

## 2022-09-06 DIAGNOSIS — E118 Type 2 diabetes mellitus with unspecified complications: Secondary | ICD-10-CM | POA: Diagnosis present

## 2022-09-06 DIAGNOSIS — I2510 Atherosclerotic heart disease of native coronary artery without angina pectoris: Secondary | ICD-10-CM | POA: Diagnosis present

## 2022-09-06 LAB — CBC
HCT: 38.2 % (ref 36.0–46.0)
Hemoglobin: 12 g/dL (ref 12.0–15.0)
MCH: 29.7 pg (ref 26.0–34.0)
MCHC: 31.4 g/dL (ref 30.0–36.0)
MCV: 94.6 fL (ref 80.0–100.0)
Platelets: 230 10*3/uL (ref 150–400)
RBC: 4.04 MIL/uL (ref 3.87–5.11)
RDW: 13.6 % (ref 11.5–15.5)
WBC: 6.8 10*3/uL (ref 4.0–10.5)
nRBC: 0 % (ref 0.0–0.2)

## 2022-09-06 LAB — BASIC METABOLIC PANEL
Anion gap: 12 (ref 5–15)
BUN: 25 mg/dL — ABNORMAL HIGH (ref 8–23)
CO2: 19 mmol/L — ABNORMAL LOW (ref 22–32)
Calcium: 9.4 mg/dL (ref 8.9–10.3)
Chloride: 103 mmol/L (ref 98–111)
Creatinine, Ser: 1.01 mg/dL — ABNORMAL HIGH (ref 0.44–1.00)
GFR, Estimated: 57 mL/min — ABNORMAL LOW (ref >60)
Glucose, Bld: 179 mg/dL — ABNORMAL HIGH (ref 70–99)
Potassium: 4.6 mmol/L (ref 3.5–5.1)
Sodium: 134 mmol/L — ABNORMAL LOW (ref 135–145)

## 2022-09-06 LAB — TROPONIN I (HIGH SENSITIVITY)
Troponin I (High Sensitivity): 30 ng/L — ABNORMAL HIGH (ref <18)
Troponin I (High Sensitivity): 27 ng/L — ABNORMAL HIGH (ref <18)

## 2022-09-06 LAB — CBG MONITORING, ED: Glucose-Capillary: 137 mg/dL — ABNORMAL HIGH (ref 70–99)

## 2022-09-06 MED ORDER — ONDANSETRON HCL 4 MG/2ML IJ SOLN
4.0000 mg | Freq: Once | INTRAMUSCULAR | Status: AC
  Administered 2022-09-06: 4 mg via INTRAVENOUS
  Filled 2022-09-06: qty 2

## 2022-09-06 MED ORDER — SODIUM CHLORIDE 0.9 % IV BOLUS
1000.0000 mL | Freq: Once | INTRAVENOUS | Status: AC
  Administered 2022-09-06: 1000 mL via INTRAVENOUS

## 2022-09-06 MED ORDER — INSULIN ASPART 100 UNIT/ML IJ SOLN
0.0000 [IU] | Freq: Three times a day (TID) | INTRAMUSCULAR | Status: DC
  Administered 2022-09-07: 5 [IU] via SUBCUTANEOUS
  Administered 2022-09-07: 2 [IU] via SUBCUTANEOUS

## 2022-09-06 MED ORDER — NITROGLYCERIN 0.4 MG SL SUBL
0.4000 mg | SUBLINGUAL_TABLET | SUBLINGUAL | Status: DC | PRN
  Administered 2022-09-06 (×2): 0.4 mg via SUBLINGUAL
  Filled 2022-09-06 (×2): qty 1

## 2022-09-06 MED ORDER — RANOLAZINE ER 500 MG PO TB12
500.0000 mg | ORAL_TABLET | Freq: Once | ORAL | Status: AC
  Administered 2022-09-06: 500 mg via ORAL
  Filled 2022-09-06: qty 1

## 2022-09-06 MED ORDER — ONDANSETRON 4 MG PO TBDP: 4.0000 mg | ORAL_TABLET | Freq: Once | ORAL | Status: DC

## 2022-09-06 MED ORDER — INSULIN ASPART 100 UNIT/ML IJ SOLN: 0.0000 [IU] | Freq: Every day | INTRAMUSCULAR | Status: DC

## 2022-09-06 MED ORDER — NITROGLYCERIN IN D5W 200-5 MCG/ML-% IV SOLN
2.0000 ug/min | INTRAVENOUS | Status: DC
  Administered 2022-09-06: 5 ug/min via INTRAVENOUS
  Filled 2022-09-06: qty 250

## 2022-09-06 NOTE — ED Provider Notes (Signed)
[place] EMERGENCY DEPARTMENT AT [facility] Provider Note   CSN: [id number] Arrival date & time: 09/06/22  1342     History  Chief Complaint  Patient presents with   Weakness    Jill Howard is a 82 y.o. female with a history of coronary artery disease, diabetes mellitus, stroke, vascular dementia, and hypertension who presents to the ED today for weakness and presyncope.  Patient reports she was moving a table at church when she developed chest pain.  She took a tablet of nitroglycerin, which helped the chest pain go away but she has been feeling weak since with associated nausea.  She feels weak all over, no focal weakness. Denies any vision changes, headaches, or loss of sensation.  She is still able to ambulate.  Patient reports that she was admitted in the hospital for several days in June for chest pain.  She had catheterization which showed severe stenosis.  She was told that the cannot have stents placed but that her symptoms will ne managed with medications.  Patient has been taking nitroglycerin multiple times in the past 2 weeks due to unstable angina.  Patient currently denies any pain in the chest or shortness of breath.  No other complaints or concerns at this time.  Echo on 06/30/22: EF of 60-65% with normal left ventricle function.   Left heart cath and coronary angiography on 06/30/22:  - mild-mod distal left main stenosis of 50% - severe mid LAD stenosis with severe diffuse calcification throughout the mid vessel and stenosis up to 90 to 95% - subtotal occlusion of left circumflex with severely diseased obtuse marginal branches - moderately severe distal RCA stenosis with severe stenosis of the ostium of the posterior AV branch    Home Medications Prior to Admission medications   Medication Sig Start Date End Date Taking? Authorizing Provider  acetaminophen (TYLENOL) 500 MG tablet Take 500-2,000 mg by mouth every 6 (six) hours as needed for moderate pain  (Pain).    [provider]  amLODipine (NORVASC) 5 MG tablet Take 1 tablet (5 mg total) by mouth daily. 08/22/22   Jodelle Gross, NP  aspirin EC 81 MG tablet Take 1 tablet (81 mg total) by mouth daily. Swallow whole. 07/02/22   Burnadette Pop, MD  atorvastatin (LIPITOR) 80 MG tablet Take 1 tablet (80 mg total) by mouth at bedtime. 08/22/22   Jodelle Gross, NP  cetirizine (ZYRTEC) 10 MG tablet Take 10 mg by mouth at bedtime as needed for allergies or rhinitis.    [provider]  Cholecalciferol (VITAMIN D) 50 MCG (2000 UT) tablet Take 1 tablet (2,000 Units total) by mouth daily. 07/01/22   Burnadette Pop, MD  clopidogrel (PLAVIX) 75 MG tablet Take 1 tablet (75 mg total) by mouth daily. 08/22/22   Jodelle Gross, NP  Cyanocobalamin (VITAMIN B-12 PO) Take 1 tablet by mouth daily.    [provider]  ezetimibe (ZETIA) 10 MG tablet Take 1 tablet (10 mg total) by mouth daily. 08/22/22   Jodelle Gross, NP  glimepiride (AMARYL) 4 MG tablet Take 4 mg by mouth 2 (two) times daily. 03/28/19   [provider]  HUMALOG KWIKPEN 100 UNIT/ML KwikPen Inject 0-22 Units into the skin 4 (four) times daily. 06/25/20   [provider]  Insulin Glargine (TOUJEO SOLOSTAR [place]) Inject 56 Units into the skin at bedtime.    [provider]  isosorbide mononitrate (IMDUR) 60 MG 24 hr tablet Take 1.5 tablets (90  mg total) by mouth every morning. 08/22/22   Jodelle Gross, NP  Loratadine (CLARITIN PO) Take 1 tablet by mouth daily as needed (for allergies).    [provider]  Magnesium 250 MG TABS Take 250 mg by mouth daily.    [provider]  metFORMIN (GLUCOPHAGE) 1000 MG tablet Take 1,000 mg by mouth 2 (two) times daily with a meal.    [provider]  metoprolol succinate (TOPROL-XL) 50 MG 24 hr tablet Take 3 tablets (150 mg total) by mouth at bedtime. 08/22/22   Jodelle Gross, NP  montelukast (SINGULAIR) 10 MG tablet Take  10 mg by mouth at bedtime.    [provider]  nitroGLYCERIN (NITROSTAT) 0.4 MG SL tablet Place 1 tablet (0.4 mg total) under the tongue every 5 (five) minutes x 3 doses as needed for chest pain. 08/22/22   Jodelle Gross, NP  Omega-3 Fatty Acids (FISH OIL) 1000 MG CAPS Take 1,000 mg by mouth daily.     [provider]  pantoprazole (PROTONIX) 40 MG tablet Take 40 mg by mouth daily as needed (Acid reflux).    [provider]  ranolazine (RANEXA) 500 MG 12 hr tablet Take 1 tablet (500 mg total) by mouth 2 (two) times daily. 07/19/22   Azalee Course, PA      Allergies    Influenza vaccines, Sitagliptin, Guaifenesin, Hydroxychloroquine, Latex, Nitrofurantoin, Other, Phenylephrine-pheniramine-dm, Ultram [tramadol hcl], and Escitalopram oxalate    Review of Systems   Review of Systems  Neurological:  Positive for weakness.  All other systems reviewed and are negative.   Physical Exam Updated Vital Signs BP 123/61   Pulse 63   Temp 98 F (36.7 C) (Oral)   Resp 17   SpO2 100%  Physical Exam Vitals and nursing note reviewed.  Constitutional:      Appearance: Normal appearance.  HENT:     Head: Normocephalic and atraumatic.     Mouth/Throat:     Mouth: Mucous membranes are moist.  Eyes:     Conjunctiva/sclera: Conjunctivae normal.     Pupils: Pupils are equal, round, and reactive to light.  Cardiovascular:     Rate and Rhythm: Normal rate and regular rhythm.     Pulses: Normal pulses.     Heart sounds: Normal heart sounds.  Pulmonary:     Effort: Pulmonary effort is normal.     Breath sounds: Normal breath sounds.  Abdominal:     Palpations: Abdomen is soft.     Tenderness: There is no abdominal tenderness.  Skin:    General: Skin is warm and dry.     Findings: No rash.  Neurological:     General: No focal deficit present.     Mental Status: She is alert.     Sensory: No sensory deficit.     Motor: No weakness.  Psychiatric:        Mood and  Affect: Mood normal.        Behavior: Behavior normal.    ED Results / Procedures / Treatments   Labs (all labs ordered are listed, but only abnormal results are displayed) Labs Reviewed  BASIC METABOLIC PANEL - Abnormal; Notable for the following components:      Result Value   Sodium 134 (*)    CO2 19 (*)    Glucose, Bld 179 (*)    BUN 25 (*)    Creatinine, Ser 1.01 (*)    GFR, Estimated 57 (*)    All  other components within normal limits  TROPONIN I (HIGH SENSITIVITY) - Abnormal; Notable for the following components:   Troponin I (High Sensitivity) 30 (*)    All other components within normal limits  TROPONIN I (HIGH SENSITIVITY) - Abnormal; Notable for the following components:   Troponin I (High Sensitivity) 27 (*)    All other components within normal limits  CBC    EKG None  Radiology DG Chest Portable 1 View  Result Date: 09/06/2022 CLINICAL DATA:  Weakness, dizziness, near syncope EXAM: PORTABLE CHEST - 1 VIEW COMPARISON:  07/16/2022 FINDINGS: Lungs are clear. Heart size and mediastinal contours are within normal limits. Aortic Atherosclerosis (ICD10-170.0). No effusion. Visualized bones unremarkable. IMPRESSION: No acute cardiopulmonary disease. Electronically Signed   By: Corlis Leak M.D.   On: 09/06/2022 15:32    Procedures Procedures: not indicated.   Medications Ordered in ED Medications  insulin aspart (novoLOG) injection 0-9 Units (has no administration in time range)  insulin aspart (novoLOG) injection 0-5 Units (has no administration in time range)  sodium chloride 0.9 % bolus 1,000 mL (0 mLs Intravenous Stopped 09/06/22 1435)  ondansetron (ZOFRAN) injection 4 mg (4 mg Intravenous Given 09/06/22 1435)    ED Course/ Medical Decision Making/ A&P             HEART Score: 5                    Medical Decision Making Amount and/or Complexity of Data Reviewed Labs: ordered. Radiology: ordered.  Risk Prescription drug management.   This patient  presents to the ED for concern of weakness and nausea after taking nitroglycerin for an episode of chest pain earlier today, this involves an extensive number of treatment options, and is a complaint that carries with it a high risk of complications and morbidity.   Differential diagnosis includes: ACS, atypical chest pain, pneumothorax, pneumonia, dehydration, electrolyte imbalance, GERD, etc.   Comorbidities  See HPI above   Additional History  Additional history obtained from patient's previous hospitalization and her cardiology notes.   Cardiac Monitoring / EKG  The patient was maintained on a cardiac monitor.  I personally viewed and interpreted the cardiac monitored which showed: sinus rhythm with a prolonged QT interval, heart rate of 62 bpm - no significant changes from prior EKG.   Lab Tests  I ordered and personally interpreted labs.  The pertinent results include:   Initial troponin of 30, repeat of 27. CBC is unremarkable - no acute anemia or infection. BUN of 25, creatinine of 1.01 otherwise BMP is within normal limits   Imaging Studies  I ordered imaging studies including CXR  I independently visualized and interpreted imaging which showed: no active cardiopulmonary disease I agree with the radiologist interpretation   Consultations  I requested consultation with cardiology,  and discussed lab and imaging findings as well as pertinent plan - they recommend: admission for further evaluation and observation.   Problem List / ED Course / Critical Interventions / Medication Management  Weakness and nausea after taking Nitroglycerin for chest pain I ordered medications including: Zofran for nausea  1L normal saline Reevaluation of the patient after these medicines showed that the patient improved Family is at bedside.  I have reviewed the patients home medicines and have made adjustments as needed   Social Determinants of Health  Prior tobacco  use   Test / Admission - Considered  Discussed results with patient and family at bedside. Patient will be admitted to cardiology  service.       Final Clinical Impression(s) / ED Diagnoses Final diagnoses:  Unstable angina [facility])    Rx / DC Orders ED Discharge Orders     None         Maxwell Marion, PA-C 09/06/22 1810    Jacalyn Lefevre, MD 09/07/22 [phone]

## 2022-09-06 NOTE — H&P (Signed)
Cardiology History and Physical    Patient ID: Jill Howard MRN: [id number]; DOB: May 03, 1940  Admit date: 09/06/2022 Date of Consult: 09/06/2022  PCP:  Trenda Moots, PA-C   [facility] Providers Cardiologist:  Nanetta Batty, MD        Patient Profile:   Jill Howard is a 82 y.o. female with a hx of multivessel CAD, hypertension, hyperlipidemia, fibromyalgia, vascular dementia, type 2 diabetes, CVA, recent cardiac cath who is being seen 09/06/2022 for the evaluation of weakness near syncope at the request of Dr. Particia Nearing.  History of Present Illness:   Jill Howard with hx as above and recent admit in June the 5th for unstable angina and cardiac cath revealed complex multivessel disease.  Plan was for medical therapy.  She returned 07/16/22 with recurrent angina.  BB increased to 150 at hs. Imdur 90 in AM amlodipine to 5 mg daily.  Lisinopril stopped.   aspirin 81 mg daily clopidogrel 75 mg daily, atorvastatin 80 mg daily and Zetia 10 mg daily.   Seen in office 08/22/22  and was taking NTG twice a week due to dyspnea and chest pressure walking into visit her friend.  Per hospital notes from 07/18/22 "Reviewed coronary anatomy and options for revascularization w Dr. Excell Seltzer. The LCX seems to be a CT. The heavily calcified LAD stenosis is the most likely source of symptoms that could be amenable to PCI, but is calcified and the vessel downstream of the stenosis is small in caliber. Risk of perforation or poor PCI outcome is therefore higher."   Now presents to ER by EMS due to dizziness and near syncope.  She had 2 episodes on near syncope today while setting up tables at church.  Setting up tables and developed chest pain.  Took NTG and pain eased but she became nauseated and very weak.  Before NTG  she feels symptoms changed last week.  Seem worse.   Rec'd 1 L of IV fluids in ER and IV zofran for N&V  Labs Na 134 K+ 4.6 BUN 25 CR 1.01 glucose 179  Hs troponin 30  WBC 6.8 Hgb  12 plts 230 PCXR  NAD  BP 117/60 P 78 R 16 afebrile    Past Medical History:  Diagnosis Date   Diabetes mellitus without complication (HCC)    Gallstones    Hypertension    Kidney stone    Kidney stone    Stroke (HCC) 10/24/1996   Vertigo     Past Surgical History:  Procedure Laterality Date   ABDOMINAL HYSTERECTOMY     KIDNEY STONE SURGERY     LEFT HEART CATH AND CORONARY ANGIOGRAPHY N/A 06/30/2022   Procedure: LEFT HEART CATH AND CORONARY ANGIOGRAPHY;  Surgeon: Tonny Bollman, MD;  Location: [facility] INVASIVE CV LAB;  Service: Cardiovascular;  Laterality: N/A;     Home Medications:  Prior to Admission medications   Medication Sig Start Date End Date Taking? Authorizing Provider  acetaminophen (TYLENOL) 500 MG tablet Take 500-2,000 mg by mouth every 6 (six) hours as needed for moderate pain (Pain).    [provider]  amLODipine (NORVASC) 5 MG tablet Take 1 tablet (5 mg total) by mouth daily. 08/22/22   Jodelle Gross, NP  aspirin EC 81 MG tablet Take 1 tablet (81 mg total) by mouth daily. Swallow whole. 07/02/22   Burnadette Pop, MD  atorvastatin (LIPITOR) 80 MG tablet Take 1 tablet (80 mg total) by mouth at bedtime. 08/22/22   Jodelle Gross, NP  cetirizine (ZYRTEC) 10 MG tablet Take 10 mg by mouth at bedtime as needed for allergies or rhinitis.    [provider]  Cholecalciferol (VITAMIN D) 50 MCG (2000 UT) tablet Take 1 tablet (2,000 Units total) by mouth daily. 07/01/22   Burnadette Pop, MD  clopidogrel (PLAVIX) 75 MG tablet Take 1 tablet (75 mg total) by mouth daily. 08/22/22   Jodelle Gross, NP  Cyanocobalamin (VITAMIN B-12 PO) Take 1 tablet by mouth daily.    [provider]  ezetimibe (ZETIA) 10 MG tablet Take 1 tablet (10 mg total) by mouth daily. 08/22/22   Jodelle Gross, NP  glimepiride (AMARYL) 4 MG tablet Take 4 mg by mouth 2 (two) times daily. 03/28/19   [provider]  HUMALOG KWIKPEN 100 UNIT/ML KwikPen Inject 0-22  Units into the skin 4 (four) times daily. 06/25/20   [provider]  Insulin Glargine (TOUJEO SOLOSTAR ) Inject 56 Units into the skin at bedtime.    [provider]  isosorbide mononitrate (IMDUR) 60 MG 24 hr tablet Take 1.5 tablets (90 mg total) by mouth every morning. 08/22/22   Jodelle Gross, NP  Loratadine (CLARITIN PO) Take 1 tablet by mouth daily as needed (for allergies).    [provider]  Magnesium 250 MG TABS Take 250 mg by mouth daily.    [provider]  metFORMIN (GLUCOPHAGE) 1000 MG tablet Take 1,000 mg by mouth 2 (two) times daily with a meal.    [provider]  metoprolol succinate (TOPROL-XL) 50 MG 24 hr tablet Take 3 tablets (150 mg total) by mouth at bedtime. 08/22/22   Jodelle Gross, NP  montelukast (SINGULAIR) 10 MG tablet Take 10 mg by mouth at bedtime.    [provider]  nitroGLYCERIN (NITROSTAT) 0.4 MG SL tablet Place 1 tablet (0.4 mg total) under the tongue every 5 (five) minutes x 3 doses as needed for chest pain. 08/22/22   Jodelle Gross, NP  Omega-3 Fatty Acids (FISH OIL) 1000 MG CAPS Take 1,000 mg by mouth daily.     [provider]  pantoprazole (PROTONIX) 40 MG tablet Take 40 mg by mouth daily as needed (Acid reflux).    [provider]  ranolazine (RANEXA) 500 MG 12 hr tablet Take 1 tablet (500 mg total) by mouth 2 (two) times daily. 07/19/22   Azalee Course, PA    Inpatient Medications: Scheduled Meds:  Continuous Infusions:  PRN Meds:   Allergies:    Allergies  Allergen Reactions   Influenza Vaccines Swelling    Arm was swollen in 2002    Sitagliptin Swelling   Guaifenesin Other (See Comments)    Unknown   Hydroxychloroquine Other (See Comments)    Eye pain   Latex Other (See Comments)    Unknown   Nitrofurantoin Nausea Only   Other Other (See Comments)    Band-Aid   Phenylephrine-Pheniramine-Dm Other (See Comments)    Unknown   Ultram [Tramadol Hcl] Other  (See Comments)    hallucinations   Escitalopram Oxalate Other (See Comments)    Hallucinations    Social History:   Social History   Socioeconomic History   Marital status: Married    Spouse name: Not on file   Number of children: Not on file   Years of education: Not on file   Highest education level: Not on file  Occupational History   Not on file  Tobacco Use   Smoking status: Former  Current packs/day: 0.00    Types: Cigarettes    Quit date: 41    Years since quitting: 41.6   Smokeless tobacco: Never  Vaping Use   Vaping status: Never Used  Substance and Sexual Activity   Alcohol use: No   Drug use: No   Sexual activity: Not Currently  Other Topics Concern   Not on file  Social History Narrative   Not on file   Social Determinants of Health   Financial Resource Strain: Not on file  Food Insecurity: No Food Insecurity (07/17/2022)   Hunger Vital Sign    Worried About Running Out of Food in the Last Year: Never true    Ran Out of Food in the Last Year: Never true  Transportation Needs: No Transportation Needs (07/17/2022)   PRAPARE - Administrator, Civil Service (Medical): No    Lack of Transportation (Non-Medical): No  Physical Activity: Not on file  Stress: Not on file  Social Connections: Unknown (06/06/2021)   Received from [facility], [facility]   Social Network    Social Network: Not on file  Intimate Partner Violence: Not At Risk (07/17/2022)   Humiliation, Afraid, Rape, and Kick questionnaire    Fear of Current or Ex-Partner: No    Emotionally Abused: No    Physically Abused: No    Sexually Abused: No    Family History:    Family History  Problem Relation Age of Onset   Stroke Other    Heart attack Other    Cancer Other    Diabetes Other    Stroke Mother    Heart attack Mother    Stroke Maternal Aunt    Stroke Paternal Aunt    Rheum arthritis Paternal Aunt    Stroke Brother    Heart attack Brother    COPD Brother     Rheum arthritis Paternal Aunt    Diabetes Son    Heart disease Son    Diabetes Daughter      ROS:  Please see the history of present illness.  General:no colds or fevers, no weight changes Skin:no rashes or ulcers HEENT:no blurred vision, no congestion CV:see HPI PUL:see HPI  was coughing last pm   GI:no diarrhea constipation or melena, no indigestion GU:no hematuria, no dysuria MS:no joint pain, no claudication Neuro:no syncope, no lightheadedness Endo:+ diabetes, no thyroid disease  All other ROS reviewed and negative.     Physical Exam/Data:   Vitals:   09/06/22 1500 09/06/22 1530 09/06/22 1600 09/06/22 1630  BP: (!) 126/58 (!) 126/54 (!) 128/57 123/61  Pulse: 65 65 66 63  Resp: 18 14 (!) 21 17  Temp:      TempSrc:      SpO2: 97% 100% 98% 100%   No intake or output data in the 24 hours ending 09/06/22 1744    08/22/2022    8:52 AM 07/22/2022    8:12 AM 07/17/2022    2:20 AM  Last 3 Weights  Weight (lbs) 172 lb 12.8 oz 174 lb 9.6 oz 177 lb 6.4 oz  Weight (kg) 78.382 kg 79.198 kg 80.468 kg     There is no height or weight on file to calculate BMI.  General:  Well nourished, well developed, in no acute distress HEENT: normal Neck: no JVD Vascular: No carotid bruits; Distal pulses 2+ bilaterally Cardiac:  normal S1, S2; RRR; no murmur gallup rub or click  Lungs:  clear to auscultation bilaterally, no wheezing, rhonchi or rales  Abd: soft, nontender, no hepatomegaly  Ext: mild edema Lt > Rt  edema which is normal for her. Musculoskeletal:  No deformities, BUE and BLE strength normal and equal Skin: warm and dry  Neuro:  alert and oriented X 3 MAE , no focal abnormalities noted Psych:  Normal affect   EKG:  The EKG was personally reviewed and demonstrates:  SR no acute ST changes Telemetry:  Telemetry was personally reviewed and demonstrates:  SR  Relevant CV Studies: Cath 06/30/2022   Dist RCA lesion is 70% stenosed.   RPAV lesion is 90% stenosed.   Dist LM  lesion is 50% stenosed.   Prox Cx lesion is 99% stenosed.   Mid Cx to Dist Cx lesion is 99% stenosed with 99% stenosed side branch in 2nd Mrg.   Mid LAD to Dist LAD lesion is 95% stenosed.   1st Diag lesion is 50% stenosed.   Mid LAD lesion is 80% stenosed.   LV end diastolic pressure is normal.   1.  Mild to moderate distal left main stenosis of 50% 2.  Severe mid LAD stenosis with severe diffuse calcification throughout the mid vessel and stenosis up to 90 to 95% 3.  Subtotal occlusion of the left circumflex with severely diseased obtuse marginal branches 4.  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  I think she has poor targets for grafting and would initially try aggressive medical therapy and antianginal treatment.  The mid to distal LAD appear to be 2 mm in diameter or less and I do not think that branch is suitable for atherectomy and/or PCI.     Diagnostic Dominance: Right     Echo 06/30/2022    1. Left ventricular ejection fraction, by estimation, is 60 to 65%. The  left ventricle has normal function. The left ventricle has no regional  wall motion abnormalities. Left ventricular diastolic parameters are  consistent with Grade I diastolic  dysfunction (impaired relaxation).   2. Right ventricular systolic function is normal. The right ventricular  size is normal.   3. Left atrial size was mildly dilated.   4. The mitral valve is abnormal. Trivial mitral valve regurgitation. No  evidence of mitral stenosis.   5. The aortic valve is tricuspid. There is mild calcification of the  aortic valve. There is mild thickening of the aortic valve. Aortic valve  regurgitation is not visualized. Aortic valve sclerosis is present, with  no evidence of aortic valve stenosis.   6. The inferior vena cava is normal in size with  greater than 50%  respiratory variability, suggesting right atrial pressure of 3 mmHg.     Laboratory Data:  High Sensitivity Troponin:   Recent Labs  Lab 09/06/22 1404 09/06/22 1602  TROPONINIHS 30* 27*     Chemistry Recent Labs  Lab 09/06/22 1404  NA 134*  K 4.6  CL 103  CO2 19*  GLUCOSE 179*  BUN 25*  CREATININE 1.01*  CALCIUM 9.4  GFRNONAA 57*  ANIONGAP 12    No results for input(s): "PROT", "ALBUMIN", "AST", "ALT", "ALKPHOS", "BILITOT" in the last 168 hours. Lipids No results for input(s): "CHOL", "TRIG", "HDL", "LABVLDL", "LDLCALC", "CHOLHDL" in the last 168 hours.  Hematology Recent Labs  Lab 09/06/22 1404  WBC 6.8  RBC 4.04  HGB 12.0  HCT 38.2  MCV 94.6  MCH 29.7  MCHC 31.4  RDW 13.6  PLT 230   Thyroid No results for input(s): "TSH", "FREET4" in the last 168 hours.  BNPNo results for input(s): "BNP", "PROBNP" in the last 168 hours.  DDimer No results for input(s): "DDIMER" in the last 168 hours.   Radiology/Studies:  DG Chest Portable 1 View  Result Date: 09/06/2022 CLINICAL DATA:  Weakness, dizziness, near syncope EXAM: PORTABLE CHEST - 1 VIEW COMPARISON:  07/16/2022 FINDINGS: Lungs are clear. Heart size and mediastinal contours are within normal limits. Aortic Atherosclerosis (ICD10-170.0). No effusion. Visualized bones unremarkable. IMPRESSION: No acute cardiopulmonary disease. Electronically Signed   By: Corlis Leak M.D.   On: 09/06/2022 15:32     Assessment and Plan:   Unstable angina with significant CAD with plan to manage medically.  She notes after taking toprol XL  she feels her heart racing.  Today was the worst she felt with the nausea. Her pain in chest and to back and in throat.  Hs troponin 30>>27.   Will check orthostatics. - her angina increased last week. On multiple  meds ranexa 500 BID, toprol XL 150 mg every evening Imdur 90 mg  amlodipine 5 mg ASA 81 plavix 75 Lipitor 80 zetia 10  admitted to Obs monitor and check for arrythmia and  walk in AM  CAD with multi vessel CAD cath 06/30/22  plan for medical therapy - unsure if medications are causing issues or more angina -   HTN stable will check orthostatic  HLD continue statin DM2 -SSI for tonight  Hx CVA in 2018   Risk Assessment/Risk Scores:     TIMI Risk Score for Unstable Angina or Non-ST Elevation MI:   The patient's TIMI risk score is 6, which indicates a 41% risk of all cause mortality, new or recurrent myocardial infarction or need for urgent revascularization in the next 14 days.          For questions or updates, please contact [facility] Please consult [email or web address] for contact info under    Signed, Nada Boozer, NP  09/06/2022 5:44 PM

## 2022-09-06 NOTE — ED Triage Notes (Signed)
[facility] reports [place] coming from church. Pt has been having weakness for the past two months, dizziness and near syncope. Family was concerned because pt had a couple of near syncope episodes today while setting up tables at church. Pt has been taking her nitroglycerin when she does not feel well and not when she is having chest pain.

## 2022-09-07 ENCOUNTER — Encounter (HOSPITAL_COMMUNITY): Payer: Self-pay | Admitting: Internal Medicine

## 2022-09-07 ENCOUNTER — Other Ambulatory Visit: Payer: Self-pay

## 2022-09-07 DIAGNOSIS — I25110 Atherosclerotic heart disease of native coronary artery with unstable angina pectoris: Secondary | ICD-10-CM | POA: Diagnosis not present

## 2022-09-07 LAB — MAGNESIUM: Magnesium: 1.8 mg/dL (ref 1.7–2.4)

## 2022-09-07 LAB — CBG MONITORING, ED: Glucose-Capillary: 197 mg/dL — ABNORMAL HIGH (ref 70–99)

## 2022-09-07 LAB — GLUCOSE, CAPILLARY: Glucose-Capillary: 290 mg/dL — ABNORMAL HIGH (ref 70–99)

## 2022-09-07 MED ORDER — ASPIRIN 81 MG PO TBEC: 81.0000 mg | DELAYED_RELEASE_TABLET | Freq: Every day | ORAL | Status: DC

## 2022-09-07 MED ORDER — MONTELUKAST SODIUM 10 MG PO TABS: 10.0000 mg | ORAL_TABLET | Freq: Every day | ORAL | Status: DC

## 2022-09-07 MED ORDER — ASPIRIN 300 MG RE SUPP: 300.0000 mg | RECTAL | Status: DC

## 2022-09-07 MED ORDER — AMLODIPINE BESYLATE 5 MG PO TABS
5.0000 mg | ORAL_TABLET | Freq: Every day | ORAL | Status: DC
  Administered 2022-09-07: 5 mg via ORAL
  Filled 2022-09-07: qty 1

## 2022-09-07 MED ORDER — MAGNESIUM OXIDE -MG SUPPLEMENT 400 (240 MG) MG PO TABS
400.0000 mg | ORAL_TABLET | Freq: Every day | ORAL | Status: DC
  Administered 2022-09-07: 400 mg via ORAL
  Filled 2022-09-07: qty 1

## 2022-09-07 MED ORDER — ONDANSETRON HCL 4 MG/2ML IJ SOLN: 4.0000 mg | Freq: Four times a day (QID) | INTRAMUSCULAR | Status: DC | PRN

## 2022-09-07 MED ORDER — NITROGLYCERIN 0.4 MG SL SUBL: 0.4000 mg | SUBLINGUAL_TABLET | SUBLINGUAL | Status: DC | PRN

## 2022-09-07 MED ORDER — SODIUM CHLORIDE 0.9 % IV SOLN: INTRAVENOUS | Status: DC

## 2022-09-07 MED ORDER — OMEGA-3-ACID ETHYL ESTERS 1 G PO CAPS
1000.0000 mg | ORAL_CAPSULE | Freq: Every day | ORAL | Status: DC
  Administered 2022-09-07: 1000 mg via ORAL
  Filled 2022-09-07 (×2): qty 1

## 2022-09-07 MED ORDER — ACETAMINOPHEN 325 MG PO TABS: 650.0000 mg | ORAL_TABLET | Freq: Four times a day (QID) | ORAL | Status: DC | PRN

## 2022-09-07 MED ORDER — INSULIN GLARGINE-YFGN 100 UNIT/ML ~~LOC~~ SOLN
30.0000 [IU] | Freq: Every day | SUBCUTANEOUS | Status: DC
  Filled 2022-09-07: qty 0.3

## 2022-09-07 MED ORDER — METOPROLOL SUCCINATE ER 50 MG PO TB24: 150.0000 mg | ORAL_TABLET | Freq: Every day | ORAL | Status: DC

## 2022-09-07 MED ORDER — EZETIMIBE 10 MG PO TABS
10.0000 mg | ORAL_TABLET | Freq: Every day | ORAL | Status: DC
  Administered 2022-09-07: 10 mg via ORAL
  Filled 2022-09-07: qty 1

## 2022-09-07 MED ORDER — PANTOPRAZOLE SODIUM 40 MG PO TBEC: 40.0000 mg | DELAYED_RELEASE_TABLET | Freq: Every day | ORAL | Status: DC | PRN

## 2022-09-07 MED ORDER — ATORVASTATIN CALCIUM 80 MG PO TABS: 80.0000 mg | ORAL_TABLET | Freq: Every day | ORAL | Status: DC

## 2022-09-07 MED ORDER — RANOLAZINE ER 500 MG PO TB12: 500.0000 mg | ORAL_TABLET | Freq: Two times a day (BID) | ORAL | 5 refills | Status: DC

## 2022-09-07 MED ORDER — ISOSORBIDE MONONITRATE ER 60 MG PO TB24
120.0000 mg | ORAL_TABLET | Freq: Every morning | ORAL | Status: DC
  Administered 2022-09-07: 120 mg via ORAL
  Filled 2022-09-07: qty 2

## 2022-09-07 MED ORDER — RANOLAZINE ER 500 MG PO TB12
500.0000 mg | ORAL_TABLET | Freq: Two times a day (BID) | ORAL | Status: DC
  Administered 2022-09-07: 500 mg via ORAL
  Filled 2022-09-07: qty 1

## 2022-09-07 MED ORDER — ACETAMINOPHEN 325 MG PO TABS: 650.0000 mg | ORAL_TABLET | ORAL | Status: DC | PRN

## 2022-09-07 MED ORDER — VITAMIN B-12 1000 MCG PO TABS
1000.0000 ug | ORAL_TABLET | Freq: Every day | ORAL | Status: DC
  Administered 2022-09-07: 1000 ug via ORAL
  Filled 2022-09-07: qty 1

## 2022-09-07 MED ORDER — ASPIRIN 81 MG PO CHEW: 324.0000 mg | CHEWABLE_TABLET | ORAL | Status: DC

## 2022-09-07 MED ORDER — HEPARIN SODIUM (PORCINE) 5000 UNIT/ML IJ SOLN
5000.0000 [IU] | Freq: Three times a day (TID) | INTRAMUSCULAR | Status: DC
  Administered 2022-09-07: 5000 [IU] via SUBCUTANEOUS
  Filled 2022-09-07: qty 1

## 2022-09-07 MED ORDER — CLOPIDOGREL BISULFATE 75 MG PO TABS
75.0000 mg | ORAL_TABLET | Freq: Every day | ORAL | Status: DC
  Administered 2022-09-07: 75 mg via ORAL
  Filled 2022-09-07: qty 1

## 2022-09-07 MED ORDER — ISOSORBIDE MONONITRATE ER 30 MG PO TB24: 90.0000 mg | ORAL_TABLET | Freq: Every morning | ORAL | Status: DC

## 2022-09-07 MED ORDER — ASPIRIN 81 MG PO TBEC
81.0000 mg | DELAYED_RELEASE_TABLET | Freq: Every day | ORAL | Status: DC
  Administered 2022-09-07: 81 mg via ORAL
  Filled 2022-09-07: qty 1

## 2022-09-07 MED ORDER — ISOSORBIDE MONONITRATE ER 120 MG PO TB24: 120.0000 mg | ORAL_TABLET | Freq: Every morning | ORAL | 6 refills | Status: AC

## 2022-09-07 NOTE — ED Notes (Signed)
ED TO INPATIENT HANDOFF REPORT  ED Nurse Name and Phone #: Darral Dash [phone]  S Name/Age/Gender Jill Howard 82 y.o. female Room/Bed: 010C/010C  Code Status   Code Status: Full Code  Home/SNF/Other Home Patient oriented to: self, place, time, and situation Is this baseline? Yes   Triage Complete: Triage complete  Chief Complaint Unstable angina (HCC) [I20.0]  Triage Note [facility] reports pt coming from church. Pt has been having weakness for the past two months, dizziness and near syncope. Family was concerned because pt had a couple of near syncope episodes today while setting up tables at church. Pt has been taking her nitroglycerin when she does not feel well and not when she is having chest pain.    Allergies Allergies  Allergen Reactions   Influenza Vaccines Swelling    Arm was swollen in 2002    Sitagliptin Swelling   Guaifenesin Other (See Comments)    Unknown   Hydroxychloroquine Other (See Comments)    Eye pain   Latex Other (See Comments)    Unknown   Nitrofurantoin Nausea Only   Other Other (See Comments)    Band-Aid   Phenylephrine-Pheniramine-Dm Other (See Comments)    Unknown   Ultram [Tramadol Hcl] Other (See Comments)    hallucinations   Escitalopram Oxalate Other (See Comments)    Hallucinations    Level of Care/Admitting Diagnosis ED Disposition     ED Disposition  Admit   Condition  --   Comment  Hospital Area: [facility] [[id number]]  Level of Care: Progressive [102]  Admit to Progressive based on following criteria: CARDIOVASCULAR & THORACIC of moderate stability with acute coronary syndrome symptoms/low risk myocardial infarction/hypertensive urgency/arrhythmias/heart failure potentially compromising stability and stable post cardiovascular intervention patients.  May place patient in observation at [facility] or [facility] if equivalent level of care is available:: No  Covid Evaluation: Asymptomatic - no recent exposure  (last 10 days) testing not required  Diagnosis: Unstable angina [facility]) [[id number]]  Admitting Physician: Maisie Fus [[id number]]  Attending Physician: Maisie Fus [phone]          B Medical/Surgery History Past Medical History:  Diagnosis Date   Diabetes mellitus without complication (HCC)    Gallstones    Hypertension    Kidney stone    Kidney stone    Stroke (HCC) 10/24/1996   Vertigo    Past Surgical History:  Procedure Laterality Date   ABDOMINAL HYSTERECTOMY     KIDNEY STONE SURGERY     LEFT HEART CATH AND CORONARY ANGIOGRAPHY N/A 06/30/2022   Procedure: LEFT HEART CATH AND CORONARY ANGIOGRAPHY;  Surgeon: Tonny Bollman, MD;  Location: [facility] INVASIVE CV LAB;  Service: Cardiovascular;  Laterality: N/A;     A IV Location/Drains/Wounds Patient Lines/Drains/Airways Status     Active Line/Drains/Airways     Name Placement date Placement time Site Days   Peripheral IV 09/06/22 20 G 1" Anterior;Left Forearm 09/06/22  1349  Forearm  1            Intake/Output Last 24 hours No intake or output data in the 24 hours ending 09/07/22 0729  Labs/Imaging Results for orders placed or performed during the hospital encounter of 09/06/22 (from the past 48 hour(s))  Basic metabolic panel     Status: Abnormal   Collection Time: 09/06/22  2:04 PM  Result Value Ref Range   Sodium 134 (L) 135 - 145 mmol/L   Potassium 4.6 3.5 - 5.1 mmol/L   Chloride 103  98 - 111 mmol/L   CO2 19 (L) 22 - 32 mmol/L   Glucose, Bld 179 (H) 70 - 99 mg/dL    Comment: Glucose reference range applies only to samples taken after fasting for at least 8 hours.   BUN 25 (H) 8 - 23 mg/dL   Creatinine, Ser 5.78 (H) 0.44 - 1.00 mg/dL   Calcium 9.4 8.9 - 46.9 mg/dL   GFR, Estimated 57 (L) >60 mL/min    Comment: (NOTE) Calculated using the CKD-EPI Creatinine Equation (2021)    Anion gap 12 5 - 15    Comment: Performed at [facility], [address] [place], [place] [address]  Troponin I (High  Sensitivity)     Status: Abnormal   Collection Time: 09/06/22  2:04 PM  Result Value Ref Range   Troponin I (High Sensitivity) 30 (H) <18 ng/L    Comment: (NOTE) Elevated high sensitivity troponin I (hsTnI) values and significant  changes across serial measurements may suggest ACS but many other  chronic and acute conditions are known to elevate hsTnI results.  Refer to the "Links" section for chest pain algorithms and additional  guidance. Performed at [facility], [address] [place], [place] [address]   CBC     Status: None   Collection Time: 09/06/22  2:04 PM  Result Value Ref Range   WBC 6.8 4.0 - 10.5 K/uL   RBC 4.04 3.87 - 5.11 MIL/uL   Hemoglobin 12.0 12.0 - 15.0 g/dL   HCT 44.0 10.2 - 72.5 %   MCV 94.6 80.0 - 100.0 fL   MCH 29.7 26.0 - 34.0 pg   MCHC 31.4 30.0 - 36.0 g/dL   RDW 36.6 44.0 - 34.7 %   Platelets 230 150 - 400 K/uL   nRBC 0.0 0.0 - 0.2 %    Comment: Performed at [facility], [address] [place], [place] [address]  Troponin I (High Sensitivity)     Status: Abnormal   Collection Time: 09/06/22  4:02 PM  Result Value Ref Range   Troponin I (High Sensitivity) 27 (H) <18 ng/L    Comment: (NOTE) Elevated high sensitivity troponin I (hsTnI) values and significant  changes across serial measurements may suggest ACS but many other  chronic and acute conditions are known to elevate hsTnI results.  Refer to the "Links" section for chest pain algorithms and additional  guidance. Performed at [facility], [address] [place], [place] [address]   CBG monitoring, ED     Status: Abnormal   Collection Time: 09/06/22  9:19 PM  Result Value Ref Range   Glucose-Capillary 137 (H) 70 - 99 mg/dL    Comment: Glucose reference range applies only to samples taken after fasting for at least 8 hours.  Magnesium     Status: None   Collection Time: 09/07/22  3:29 AM  Result Value Ref Range   Magnesium 1.8 1.7 - 2.4 mg/dL    Comment: Performed  at [facility], [address] [place], [place] [address]   DG Chest Portable 1 View  Result Date: 09/06/2022 CLINICAL DATA:  Weakness, dizziness, near syncope EXAM: PORTABLE CHEST - 1 VIEW COMPARISON:  07/16/2022 FINDINGS: Lungs are clear. Heart size and mediastinal contours are within normal limits. Aortic Atherosclerosis (ICD10-170.0). No effusion. Visualized bones unremarkable. IMPRESSION: No acute cardiopulmonary disease. Electronically Signed   By: Corlis Leak M.D.   On: 09/06/2022 15:32    Pending Labs  Unresulted Labs (From admission, onward)    None       Vitals/Pain Today's Vitals   09/07/22 0600 09/07/22 0630 09/07/22 0633 09/07/22 0700  BP: (!) 141/56 (!) 121/55  (!) 127/57  Pulse: 71 67  65  Resp: 12 (!) 22  12  Temp:      TempSrc:      SpO2: 96% 96%  97%  Weight:      Height:      PainSc:   0-No pain     Isolation Precautions No active isolations  Medications Medications  amLODipine (NORVASC) tablet 5 mg (has no administration in time range)  atorvastatin (LIPITOR) tablet 80 mg (has no administration in time range)  ezetimibe (ZETIA) tablet 10 mg (has no administration in time range)  isosorbide mononitrate (IMDUR) 24 hr tablet 90 mg (has no administration in time range)  metoprolol succinate (TOPROL-XL) 24 hr tablet 150 mg (has no administration in time range)  nitroGLYCERIN (NITROSTAT) SL tablet 0.4 mg (has no administration in time range)  ranolazine (RANEXA) 12 hr tablet 500 mg (500 mg Oral Not Given 09/07/22 0322)  insulin glargine-yfgn (SEMGLEE) injection 30 Units (has no administration in time range)  pantoprazole (PROTONIX) EC tablet 40 mg (has no administration in time range)  clopidogrel (PLAVIX) tablet 75 mg (has no administration in time range)  cyanocobalamin (VITAMIN B12) tablet 1,000 mcg (has no administration in time range)  magnesium oxide (MAG-OX) tablet 400 mg (has no administration in time range)  omega-3 acid ethyl esters (LOVAZA)  capsule 1,000 mg (has no administration in time range)  montelukast (SINGULAIR) tablet 10 mg (has no administration in time range)  aspirin chewable tablet 324 mg (324 mg Oral Not Given 09/07/22 0321)    Or  aspirin suppository 300 mg ( Rectal See Alternative 09/07/22 0321)  aspirin EC tablet 81 mg (has no administration in time range)  acetaminophen (TYLENOL) tablet 650 mg (has no administration in time range)  ondansetron (ZOFRAN) injection 4 mg (has no administration in time range)  heparin injection 5,000 Units (5,000 Units Subcutaneous Given 09/07/22 0533)  0.9 %  sodium chloride infusion ( Intravenous Not Given 09/07/22 0320)  insulin aspart (novoLOG) injection 0-9 Units (has no administration in time range)  insulin aspart (novoLOG) injection 0-5 Units ( Subcutaneous Not Given 09/06/22 2131)  nitroGLYCERIN 50 mg in dextrose 5 % 250 mL (0.2 mg/mL) infusion (40 mcg/min Intravenous Rate/Dose Change 09/07/22 6045)  sodium chloride 0.9 % bolus 1,000 mL (0 mLs Intravenous Stopped 09/06/22 1435)  ondansetron (ZOFRAN) injection 4 mg (4 mg Intravenous Given 09/06/22 1435)  ranolazine (RANEXA) 12 hr tablet 500 mg (500 mg Oral Given 09/06/22 2003)    Mobility walks     Focused Assessments Cardiac Assessment Handoff:  Cardiac Rhythm: Normal sinus rhythm Lab Results  Component Value Date   TROPONINI <0.03 10/24/2016   Lab Results  Component Value Date   DDIMER 0.94 (H) 06/29/2022   Does the Patient currently have chest pain? No    R Recommendations: See Admitting Provider Note  Report given to:   Additional Notes: Pt is hard of hearing and does not have her hearing aids in.

## 2022-09-07 NOTE — Plan of Care (Signed)

## 2022-09-07 NOTE — Discharge Summary (Signed)
Discharge Summary    Patient ID: Jill Howard MRN: [id number]; DOB: [birth year]  Admit date: 09/06/2022 Discharge date: 09/07/2022  PCP:  Trenda Moots, PA-C   [facility] Providers Cardiologist:  Nanetta Batty, MD   {  Discharge Diagnoses    Principal Problem:   Unstable angina [facility]) Active Problems:   Essential hypertension   Diabetes mellitus with complication (HCC)   CVA (cerebral vascular accident) (HCC)   Hyperlipidemia   Obesity (BMI 30-39.9)   Coronary artery disease     Diagnostic Studies/Procedures    Cath 06/30/2022   Dist RCA lesion is 70% stenosed.   RPAV lesion is 90% stenosed.   Dist LM lesion is 50% stenosed.   Prox Cx lesion is 99% stenosed.   Mid Cx to Dist Cx lesion is 99% stenosed with 99% stenosed side branch in 2nd Mrg.   Mid LAD to Dist LAD lesion is 95% stenosed.   1st Diag lesion is 50% stenosed.   Mid LAD lesion is 80% stenosed.   LV end diastolic pressure is normal.   1.  Mild to moderate distal left main stenosis of 50% 2.  Severe mid LAD stenosis with severe diffuse calcification throughout the mid vessel and stenosis up to 90 to 95% 3.  Subtotal occlusion of the left circumflex with severely diseased obtuse marginal branches 4.  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  I think she has poor targets for grafting and would initially try aggressive medical therapy and antianginal treatment.  The mid to distal LAD appear to be 2 mm in diameter or less and I do not think that branch is suitable for atherectomy and/or PCI.     Diagnostic Dominance: Right     Echo 06/30/2022    1. Left ventricular ejection fraction, by estimation, is 60 to 65%. The  left ventricle has normal function. The left ventricle has no regional  wall motion abnormalities. Left ventricular  diastolic parameters are  consistent with Grade I diastolic  dysfunction (impaired relaxation).   2. Right ventricular systolic function is normal. The right ventricular  size is normal.   3. Left atrial size was mildly dilated.   4. The mitral valve is abnormal. Trivial mitral valve regurgitation. No  evidence of mitral stenosis.   5. The aortic valve is tricuspid. There is mild calcification of the  aortic valve. There is mild thickening of the aortic valve. Aortic valve  regurgitation is not visualized. Aortic valve sclerosis is present, with  no evidence of aortic valve stenosis.   6. The inferior vena cava is normal in size with greater than 50%  respiratory variability, suggesting right atrial pressure of 3 mmHg.  _____________   History of Present Illness     Jill Howard is a 82 y.o. female with  hx of multivessel CAD, hypertension, hyperlipidemia, fibromyalgia, vascular dementia, type 2 diabetes, CVA, recent cardiac cath.  Patient was admitted under cardiology service for unstable angina.  Patient has recent admission in June 5 for unstable angina and underwent cardiac catheterization that revealed complex multivessel disease with plans to manage medically due to nonviable targets for revascularization.  Per previous hospital note coronary anatomy and options for revascularization were discussed with Dr. Excell Seltzer who said the heavily calcified LAD lesion was not thought to be amenable to PCI with significant concerns of perforation or  poor PCI outcomes.  She has had return visits to the emergency room now for recurrent angina with continued up titration of antianginals.  Patient presented to the ER via EMS due to some complaints of dizziness and near syncopal events.  He states that when she was walking to set up tables at church she started developing chest pain.  She took nitroglycerin with some relief but became nauseated and very weak.  She stated that her symptoms had changed in the  last week and seemed worse.  In the emergency room she received IV fluid bolus and Zofran for nausea and vomiting.  Troponins were mildly elevated and flat at 30-27.  EKG without any acute ischemic changes.  She was admitted for further observation.  After discussing with patient she states that is was primarily palpitations that she had concerned of and was a little nauseous yesterday.   Hospital Course     Consultants:    Unstable angina CAD with complex multivessel disease Has previous admission and underwent cardiac catheterization in June 2024.  She had diffuse disease in her left main and severe mid LAD stenosis with severe calcifications 90 to 95%.  Subtotal occlusion of left circumflex with severely diseased obtuse marginal branches.  Moderate to severe distal RCA stenosis.  Calcifications that were felt to be poor targets for grafting, PCI, atherectomy so aggressive medical therapy was recommended.  She has stated that her angina has worsened in the last week.  We have increased her Imdur. Continue Ranexa 500 mg twice daily, Toprol-XL 150 mg, Imdur increased 120 mg, amlodipine 5 mg, aspirin and Plavix, Lipitor, Zetia Normal echo 06/30/2022  Hypertension Decently controlled on amlodipine 5 mg  Hyperlipidemia continue meds as above  Diabetes type 2 Continue home regiment.  Patient seen and examined by myself and Dr. Wyline Mood and deemed stable for discharge.  Follow-up has been made.  Medications sent to her home pharmacy at [facility].   Did the patient have an acute coronary syndrome (MI, NSTEMI, STEMI, etc) this admission?:  No.   The elevated Troponin was due to the acute medical illness (demand ischemia).    _____________  Discharge Vitals Blood pressure 128/65, pulse 70, temperature 98.2 F (36.8 C), temperature source Oral, resp. rate (!) 22, height 5\' 3"  (1.6 m), weight 78 kg, SpO2 98%.  Filed Weights   09/07/22 0317  Weight: 78 kg    Labs & Radiologic Studies     CBC Recent Labs    09/06/22 1404  WBC 6.8  HGB 12.0  HCT 38.2  MCV 94.6  PLT 230   Basic Metabolic Panel Recent Labs    53/66/44 1404 09/07/22 0329  NA 134*  --   K 4.6  --   CL 103  --   CO2 19*  --   GLUCOSE 179*  --   BUN 25*  --   CREATININE 1.01*  --   CALCIUM 9.4  --   MG  --  1.8   Liver Function Tests No results for input(s): "AST", "ALT", "ALKPHOS", "BILITOT", "PROT", "ALBUMIN" in the last 72 hours. No results for input(s): "LIPASE", "AMYLASE" in the last 72 hours. High Sensitivity Troponin:   Recent Labs  Lab 09/06/22 1404 09/06/22 1602  TROPONINIHS 30* 27*    BNP Invalid input(s): "POCBNP" D-Dimer No results for input(s): "DDIMER" in the last 72 hours. Hemoglobin A1C No results for input(s): "HGBA1C" in the last 72 hours. Fasting Lipid Panel No results for input(s): "CHOL", "HDL", "LDLCALC", "TRIG", "  CHOLHDL", "LDLDIRECT" in the last 72 hours. Thyroid Function Tests No results for input(s): "TSH", "T4TOTAL", "T3FREE", "THYROIDAB" in the last 72 hours.  Invalid input(s): "FREET3" _____________  DG Chest Portable 1 View  Result Date: 09/06/2022 CLINICAL DATA:  Weakness, dizziness, near syncope EXAM: PORTABLE CHEST - 1 VIEW COMPARISON:  07/16/2022 FINDINGS: Lungs are clear. Heart size and mediastinal contours are within normal limits. Aortic Atherosclerosis (ICD10-170.0). No effusion. Visualized bones unremarkable. IMPRESSION: No acute cardiopulmonary disease. Electronically Signed   By: Corlis Leak M.D.   On: 09/06/2022 15:32   Disposition   Pt is being discharged home today in good condition.  Follow-up Plans & Appointments     Follow-up Information     Jodelle Gross, NP Follow up.   Specialties: Cardiology, Radiology, Cardiology Why: Thursday Sep 22, 2022 Appt at 2:20 PM (25 min) Contact information: [address] [place] [address] [phone]                Discharge Instructions     Diet - low sodium  heart healthy   Complete by: As directed    Increase activity slowly   Complete by: As directed         Discharge Medications   Allergies as of 09/07/2022       Reactions   Influenza Vaccines Swelling   Arm was swollen in 2002    Sitagliptin Swelling   Guaifenesin Other (See Comments)   Unknown   Hydroxychloroquine Other (See Comments)   Eye pain   Latex Other (See Comments)   Unknown   Nitrofurantoin Nausea Only   Other Other (See Comments)   Band-Aid   Phenylephrine-pheniramine-dm Other (See Comments)   Unknown   Ultram [tramadol Hcl] Other (See Comments)   hallucinations   Escitalopram Oxalate Other (See Comments)   Hallucinations        Medication List     TAKE these medications    acetaminophen 500 MG tablet Commonly known as: TYLENOL Take 500-2,000 mg by mouth every 6 (six) hours as needed for moderate pain (Pain).   amLODipine 5 MG tablet Commonly known as: NORVASC Take 1 tablet (5 mg total) by mouth daily.   aspirin EC 81 MG tablet Take 1 tablet (81 mg total) by mouth daily. Swallow whole.   atorvastatin 80 MG tablet Commonly known as: LIPITOR Take 1 tablet (80 mg total) by mouth at bedtime.   cetirizine 10 MG tablet Commonly known as: ZYRTEC Take 10 mg by mouth at bedtime as needed for allergies or rhinitis.   CLARITIN PO Take 1 tablet by mouth daily as needed (for allergies).   clopidogrel 75 MG tablet Commonly known as: PLAVIX Take 1 tablet (75 mg total) by mouth daily.   empagliflozin 10 MG Tabs tablet Commonly known as: JARDIANCE Take 1 tablet by mouth daily.   ezetimibe 10 MG tablet Commonly known as: ZETIA Take 1 tablet (10 mg total) by mouth daily.   Fish Oil 1000 MG Caps Take 1,000 mg by mouth daily.   glimepiride 4 MG tablet Commonly known as: AMARYL Take 4 mg by mouth 2 (two) times daily.   HumaLOG KwikPen 100 UNIT/ML KwikPen Generic drug: insulin lispro Inject 0-22 Units into the skin 4 (four) times daily.    isosorbide mononitrate 120 MG 24 hr tablet Commonly known as: IMDUR Take 1 tablet (120 mg total) by mouth every morning. What changed:  medication strength how much to take   Magnesium 250 MG Tabs Take 250  mg by mouth daily.   metFORMIN 1000 MG tablet Commonly known as: GLUCOPHAGE Take 1,000 mg by mouth 2 (two) times daily with a meal.   metoprolol succinate 50 MG 24 hr tablet Commonly known as: TOPROL-XL Take 3 tablets (150 mg total) by mouth at bedtime.   montelukast 10 MG tablet Commonly known as: SINGULAIR Take 10 mg by mouth at bedtime.   nitroGLYCERIN 0.4 MG SL tablet Commonly known as: NITROSTAT Place 1 tablet (0.4 mg total) under the tongue every 5 (five) minutes x 3 doses as needed for chest pain.   pantoprazole 40 MG tablet Commonly known as: PROTONIX Take 40 mg by mouth daily as needed (Acid reflux).   ranolazine 500 MG 12 hr tablet Commonly known as: RANEXA Take 1 tablet (500 mg total) by mouth 2 (two) times daily.   TOUJEO SOLOSTAR [place] Inject 56 Units into the skin at bedtime.   VITAMIN B-12 PO Take 1 tablet by mouth daily.   Vitamin D 50 MCG (2000 UT) tablet Take 1 tablet (2,000 Units total) by mouth daily.           Outstanding Labs/Studies    Duration of Discharge Encounter   Greater than 30 minutes including physician time.  Signed, Abagail Kitchens, PA-C 09/07/2022, 9:47 AM

## 2022-09-16 IMAGING — MR MR HEAD W/O CM
10 of 11 series · 43 of 48 positions shown · non-contrast
Comparison: Head CT earlier same day.

CLINICAL DATA: Dizziness. Question posterior circulation stroke.
Left neck pain.

EXAM:
MRI HEAD WITHOUT CONTRAST
TECHNIQUE: Multiplanar, multiecho pulse sequences of the brain and surrounding
structures were obtained without intravenous contrast.

[Series 5: DWI · axial · 3.0mm · 0.88mm/px · z∈[-114,+31]mm · 9 of 100 slices shown (1 of 4)]
[im 1/100]
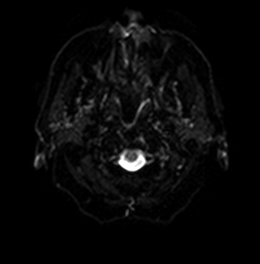
[im 13/100]
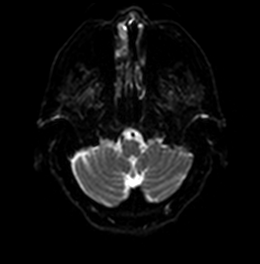
[im 25/100]
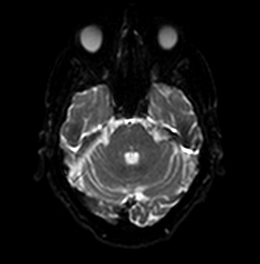
[im 38/100]
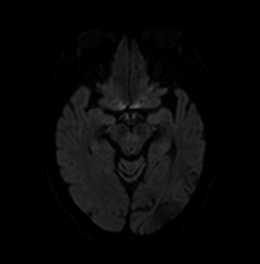
[im 50/100]
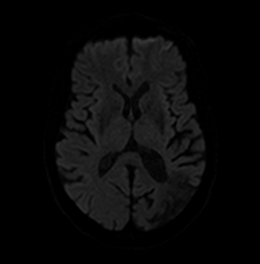
[im 62/100]
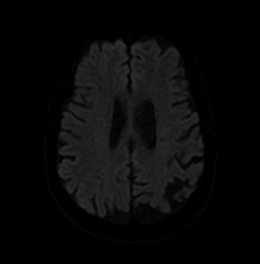
[im 75/100]
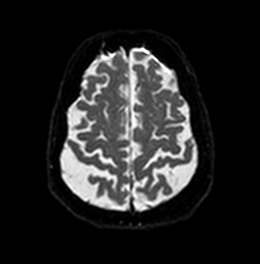
[im 87/100]
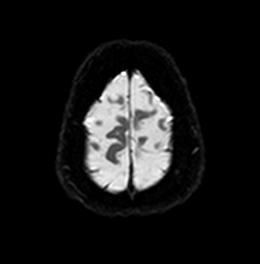
[im 100/100]
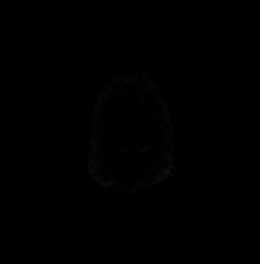

[Series 6: DWI · axial · 3.0mm · 0.88mm/px · z∈[-114,+31]mm · 5 of 50 slices shown (2 of 4)]
[im 1/50]
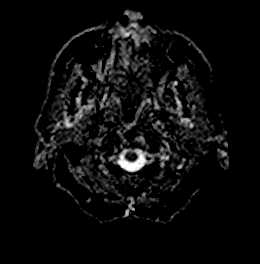
[im 13/50]
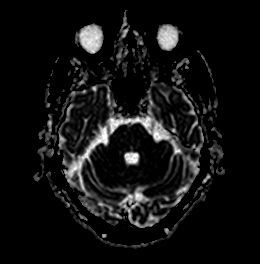
[im 25/50]
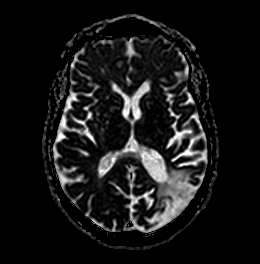
[im 37/50]
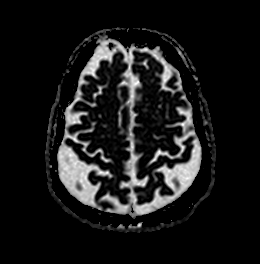
[im 50/50]
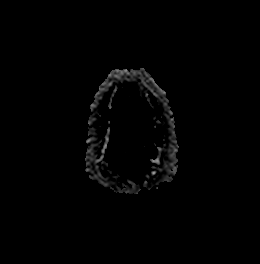

[Series 7: DWI · coronal · 4.0mm · 0.88mm/px · 7 of 72 slices shown (3 of 4)]
[im 1/72]
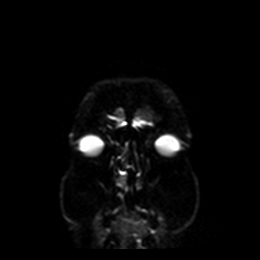
[im 12/72]
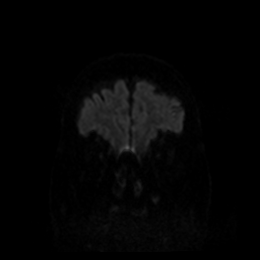
[im 24/72]
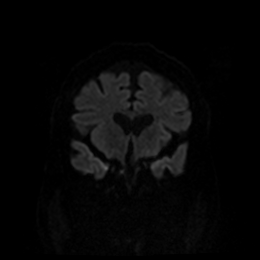
[im 36/72]
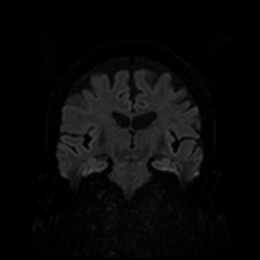
[im 48/72]
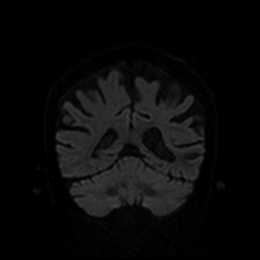
[im 60/72]
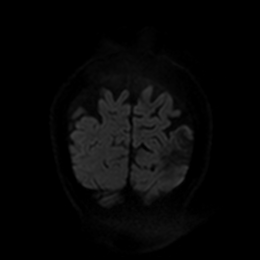
[im 72/72]
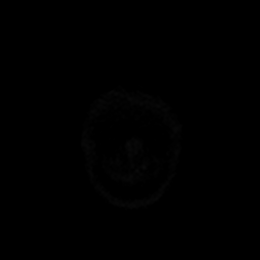

[Series 8: DWI · coronal · 4.0mm · 0.88mm/px · 3 of 36 slices shown (4 of 4)]
[im 1/36]
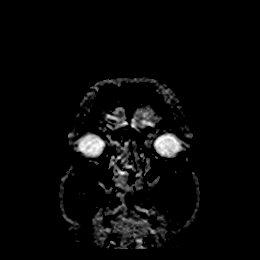
[im 18/36]
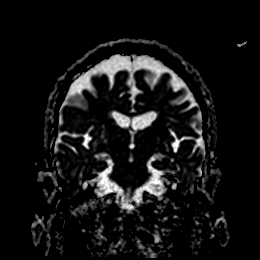
[im 36/36]
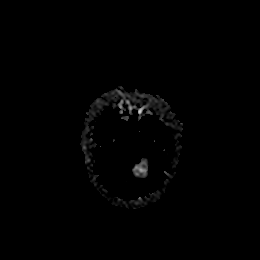

[Series 9: FLAIR · axial · 5.0mm · 0.45mm/px · z∈[-111,+31]mm · 2 of 25 slices shown]
[im 1/25]
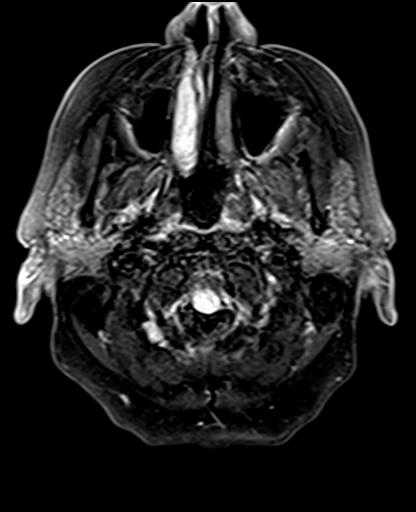
[im 25/25]
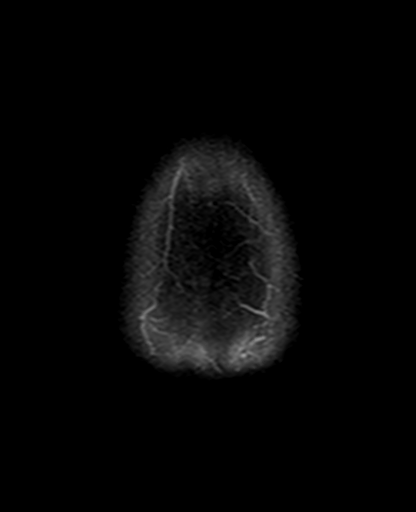

[Series 11: pha_images · axial · 3.0mm · 0.90mm/px · z∈[-115,+32]mm · 5 of 51 slices shown]
[im 1/51]
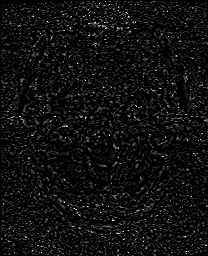
[im 13/51]
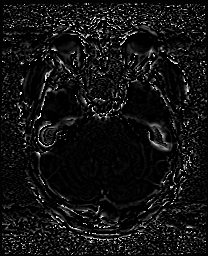
[im 26/51]
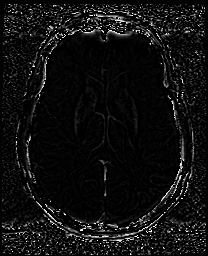
[im 38/51]
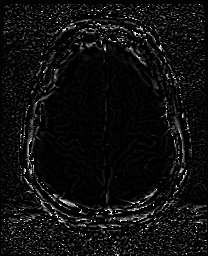
[im 51/51]
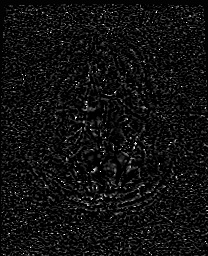

[Series 12: swi_images · axial · 3.0mm · 0.90mm/px · z∈[-115,+35]mm · 5 of 52 slices shown]
[im 1/52]
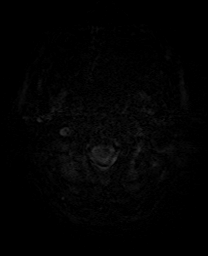
[im 13/52]
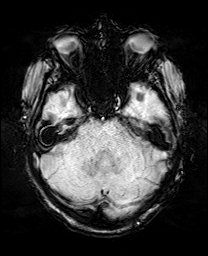
[im 26/52]
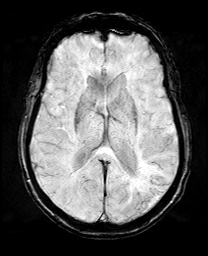
[im 39/52]
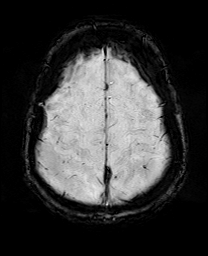
[im 52/52]
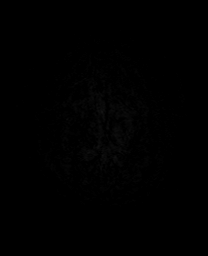

[Series 14: T1 · sagittal · 5.0mm · 0.75mm/px · 2 of 25 slices shown]
[im 1/25]
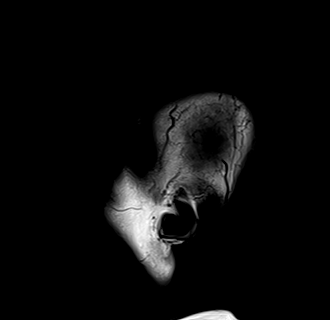
[im 25/25]
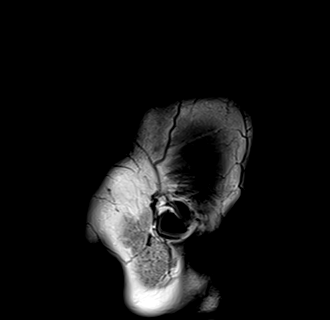

[Series 15: T2 · axial · 5.0mm · 0.72mm/px · z∈[-110,+32]mm · 2 of 25 slices shown (1 of 2)]
[im 1/25]
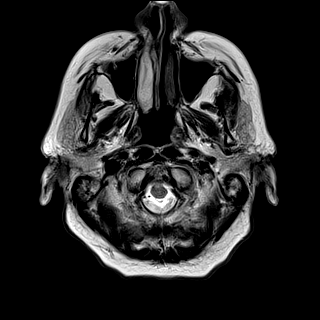
[im 25/25]
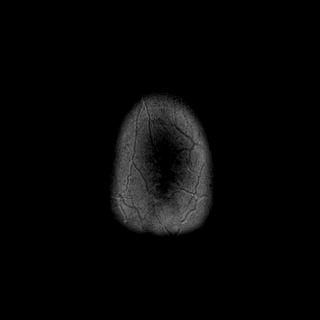

[Series 17: T2 · coronal · 5.0mm · 0.34mm/px · 3 of 31 slices shown (2 of 2)]
[im 1/31]
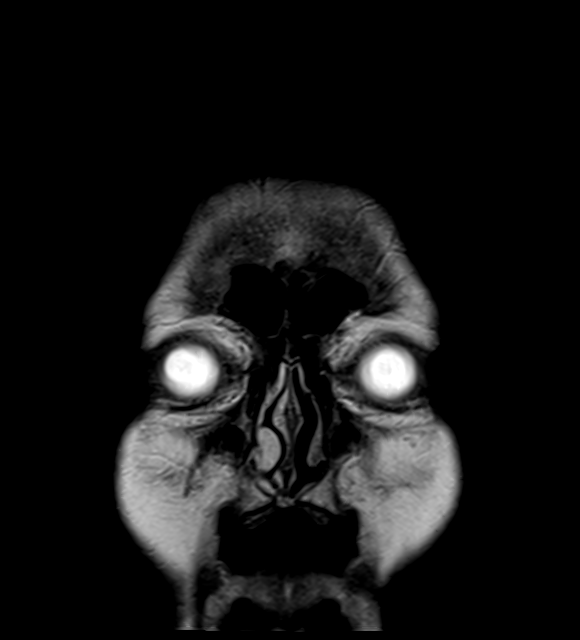
[im 16/31]
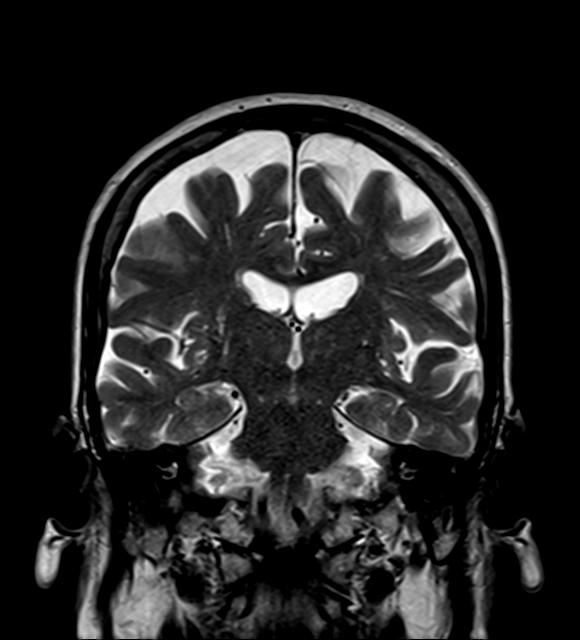
[im 31/31]
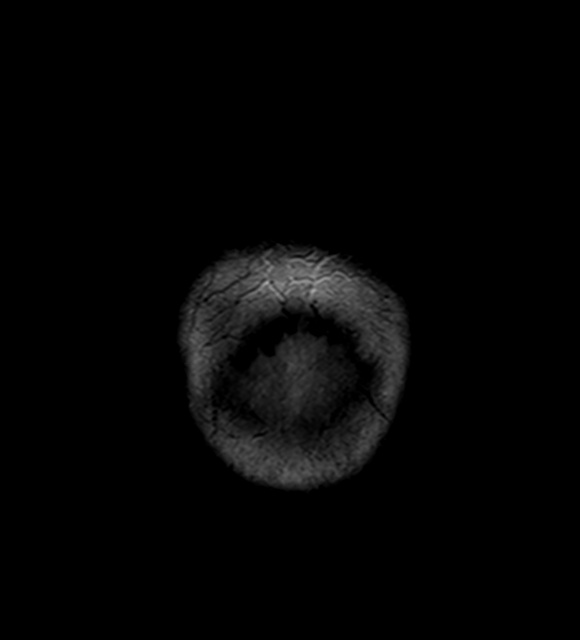

[43 of 48 positions shown; findings below may reference images not displayed]

FINDINGS: Brain: Diffusion imaging does not show any acute or subacute
infarction. Brainstem and cerebellum are normal. There is old
infarction in the left parieto-occipital brain with atrophy,
encephalomalacia and gliosis. Mild chronic small-vessel ischemic
change affecting the white matter elsewhere. No mass, acute
hemorrhage, hydrocephalus or extra-axial collection. Minimal
hemosiderin deposition associated with the left parieto-occipital
old stroke.

Vascular: Major vessels at the base of the brain show flow.

Skull and upper cervical spine: Negative

Sinuses/Orbits: Clear/normal

Other: None
IMPRESSION: No acute finding. Old infarction in the left parieto-occipital brain
with atrophy, encephalomalacia and gliosis.

Mild chronic small-vessel change of the hemispheric white matter
elsewhere.

## 2022-09-20 NOTE — Progress Notes (Unsigned)
Cardiology Clinic Note   Patient Name: Jill Howard Date of Encounter: 09/22/2022  Primary Care Provider:  Trenda Moots, PA-C Primary Cardiologist:  Nanetta Batty, MD  Patient Profile    Jill Howard is a 82 y.o. female with  hx of multivessel CAD, hypertension, hyperlipidemia, fibromyalgia, vascular dementia, type 2 diabetes, CVA.  Patient was admitted on June 5 for unstable angina underwent cardiac catheterization that revealed complex multivessel disease with plans to manage it medically as there were no viable areas for intervention and concerns for perforation or for PCI outcomes.  She was readmitted on 09/06/2022 for recurrent unstable angina.  She also had complaints of palpitations and nausea.  She was treated with IV fluids and Zofran and had resolution of symptoms.  Discharged the following day on 09/07/2022 after overnight admission for observation.  Past Medical History    Past Medical History:  Diagnosis Date   Diabetes mellitus without complication (HCC)    Gallstones    Hypertension    Kidney stone    Kidney stone    Stroke (HCC) 10/24/1996   Vertigo    Past Surgical History:  Procedure Laterality Date   ABDOMINAL HYSTERECTOMY     KIDNEY STONE SURGERY     LEFT HEART CATH AND CORONARY ANGIOGRAPHY N/A 06/30/2022   Procedure: LEFT HEART CATH AND CORONARY ANGIOGRAPHY;  Surgeon: Tonny Bollman, MD;  Location: [facility] INVASIVE CV LAB;  Service: Cardiovascular;  Laterality: N/A;    Allergies  Allergies  Allergen Reactions   Influenza Vaccines Swelling    Arm was swollen in 2002    Sitagliptin Swelling   Guaifenesin Other (See Comments)    Unknown   Hydroxychloroquine Other (See Comments)    Eye pain   Latex Other (See Comments)    Unknown   Nitrofurantoin Nausea Only   Other Other (See Comments)    Band-Aid   Phenylephrine-Pheniramine-Dm Other (See Comments)    Unknown   Ultram [Tramadol Hcl] Other (See Comments)    hallucinations   Escitalopram Oxalate  Other (See Comments)    Hallucinations    History of Present Illness    Jill Howard comes today posthospitalization.  She has not had any further episodes of near syncope or syncope.  The patient was moving furniture in her home when she began to feel badly and had an episode of presyncope and this was followed by a syncopal episode a few minutes later.  Since returning home the patient has been feeling much better.  However when she bends down to get laundry out of the washer machine she does feel some chest pressure and some fatigue.  She also complains that when she takes metoprolol 150 mg at night she initially begins to feel her heart racing, a pause, and then a lower heart rate with associated discomfort throughout this episode.  She was increased on isosorbide to 120 mg daily and was placed on Ranexa 500 mg twice daily.  She continues to want to do exertional activities around her house despite her husband and children's insistence that they be able to help her.  She admits to not wanting to ask for help as it is easier for her to do it herself.  Unfortunately the patient becomes symptomatic in doing so.  Home Medications    Current Outpatient Medications  Medication Sig Dispense Refill   acetaminophen (TYLENOL) 500 MG tablet Take 500-2,000 mg by mouth every 6 (six) hours as needed for moderate pain (Pain).     amLODipine (NORVASC) 5  MG tablet Take 1 tablet (5 mg total) by mouth daily. 90 tablet 2   aspirin EC 81 MG tablet Take 1 tablet (81 mg total) by mouth daily. Swallow whole. 30 tablet 1   atorvastatin (LIPITOR) 80 MG tablet Take 1 tablet (80 mg total) by mouth at bedtime. 90 tablet 3   cetirizine (ZYRTEC) 10 MG tablet Take 10 mg by mouth at bedtime as needed for allergies or rhinitis.     Cholecalciferol (VITAMIN D) 50 MCG (2000 UT) tablet Take 1 tablet (2,000 Units total) by mouth daily.     clopidogrel (PLAVIX) 75 MG tablet Take 1 tablet (75 mg total) by mouth daily. 90 tablet 3    Cyanocobalamin (VITAMIN B-12 PO) Take 1 tablet by mouth daily.     empagliflozin (JARDIANCE) 10 MG TABS tablet Take 1 tablet by mouth daily.     ezetimibe (ZETIA) 10 MG tablet Take 1 tablet (10 mg total) by mouth daily. 90 tablet 3   glimepiride (AMARYL) 4 MG tablet Take 4 mg by mouth 2 (two) times daily.     HUMALOG KWIKPEN 100 UNIT/ML KwikPen Inject 0-22 Units into the skin 4 (four) times daily.     Insulin Glargine (TOUJEO SOLOSTAR [place]) Inject 56 Units into the skin at bedtime.     isosorbide mononitrate (IMDUR) 120 MG 24 hr tablet Take 1 tablet (120 mg total) by mouth every morning. 30 tablet 6   Loratadine (CLARITIN PO) Take 1 tablet by mouth daily as needed (for allergies).     Magnesium 250 MG TABS Take 250 mg by mouth daily.     metFORMIN (GLUCOPHAGE) 1000 MG tablet Take 1,000 mg by mouth 2 (two) times daily with a meal.     metoprolol succinate (TOPROL-XL) 50 MG 24 hr tablet Take 3 tablets (150 mg total) by mouth at bedtime. 270 tablet 5   montelukast (SINGULAIR) 10 MG tablet Take 10 mg by mouth at bedtime.     nitroGLYCERIN (NITROSTAT) 0.4 MG SL tablet Place 1 tablet (0.4 mg total) under the tongue every 5 (five) minutes x 3 doses as needed for chest pain. 25 tablet 3   Omega-3 Fatty Acids (FISH OIL) 1000 MG CAPS Take 1,000 mg by mouth daily.      pantoprazole (PROTONIX) 40 MG tablet Take 40 mg by mouth daily as needed (Acid reflux).     ranolazine (RANEXA) 500 MG 12 hr tablet Take 500 mg by mouth as directed. Take 1 Tablet in The Morning 500 mg . Take 2 Tablets In Evening.     No current facility-administered medications for this visit.     Family History    Family History  Problem Relation Age of Onset   Stroke Other    Heart attack Other    Cancer Other    Diabetes Other    Stroke Mother    Heart attack Mother    Stroke Maternal Aunt    Stroke Paternal Aunt    Rheum arthritis Paternal Aunt    Stroke Brother    Heart attack Brother    COPD Brother    Rheum arthritis  Paternal Aunt    Diabetes Son    Heart disease Son    Diabetes Daughter    She indicated that her mother is deceased. She indicated that her father is deceased. She indicated that her brother is deceased. She indicated that her daughter is alive. She indicated that her son is alive. She indicated that her maternal aunt is deceased. She  indicated that both of her paternal aunts are deceased. She indicated that the status of her other is unknown.  Social History    Social History   Socioeconomic History   Marital status: Married    Spouse name: Not on file   Number of children: Not on file   Years of education: Not on file   Highest education level: Not on file  Occupational History   Not on file  Tobacco Use   Smoking status: Former    Current packs/day: 0.00    Types: Cigarettes    Quit date: 23    Years since quitting: 41.6   Smokeless tobacco: Never  Vaping Use   Vaping status: Never Used  Substance and Sexual Activity   Alcohol use: No   Drug use: No   Sexual activity: Not Currently  Other Topics Concern   Not on file  Social History Narrative   Not on file   Social Determinants of Health   Financial Resource Strain: Not on file  Food Insecurity: No Food Insecurity (07/17/2022)   Hunger Vital Sign    Worried About Running Out of Food in the Last Year: Never true    Ran Out of Food in the Last Year: Never true  Transportation Needs: No Transportation Needs (07/17/2022)   PRAPARE - Administrator, Civil Service (Medical): No    Lack of Transportation (Non-Medical): No  Physical Activity: Not on file  Stress: Not on file  Social Connections: Unknown (06/06/2021)   Received from [facility]   Social Network    Social Network: Not on file  Intimate Partner Violence: Not At Risk (07/17/2022)   Humiliation, Afraid, Rape, and Kick questionnaire    Fear of Current or Ex-Partner: No    Emotionally Abused: No    Physically Abused: No     Sexually Abused: No     Review of Systems    General:  No chills, fever, night sweats or weight changes.  Cardiovascular:  No chest pain, dyspnea on exertion, edema, orthopnea, positive for palpitations after taking metoprolol in the evening,, paroxysmal nocturnal dyspnea. Dermatological: No rash, lesions/masses Respiratory: No cough, dyspnea Urologic: No hematuria, dysuria Abdominal:   No nausea, vomiting, diarrhea, bright red blood per rectum, melena, or hematemesis Neurologic:  No visual changes, wkns, changes in mental status. All other systems reviewed and are otherwise negative except as noted above.       Physical Exam    VS:  BP 128/72   Pulse (!) 59   Ht 5' 3.5" (1.613 m)   Wt 172 lb 6.4 oz (78.2 kg)   SpO2 99%   BMI 30.06 kg/m  , BMI Body mass index is 30.06 kg/m.     GEN: Well nourished, well developed, in no acute distress. HEENT: normal. Neck: Supple, no JVD, carotid bruits, or masses. Cardiac: RRR, no murmurs, rubs, or gallops. No clubbing, cyanosis, edema.  Radials/DP/PT 2+ and equal bilaterally.  Respiratory:  Respirations regular and unlabored, clear to auscultation bilaterally. GI: Soft, nontender, nondistended, BS + x 4. MS: no deformity or atrophy. Skin: warm and dry, no rash. Neuro:  Strength and sensation are intact. Psych: Normal affect.      Lab Results  Component Value Date   WBC 6.8 09/06/2022   HGB 12.0 09/06/2022   HCT 38.2 09/06/2022   MCV 94.6 09/06/2022   PLT 230 09/06/2022   Lab Results  Component Value Date   CREATININE 1.01 (H) 09/06/2022  BUN 25 (H) 09/06/2022   NA 134 (L) 09/06/2022   K 4.6 09/06/2022   CL 103 09/06/2022   CO2 19 (L) 09/06/2022   Lab Results  Component Value Date   ALT 25 07/16/2022   AST 19 07/16/2022   ALKPHOS 71 07/16/2022   BILITOT 0.8 07/16/2022   Lab Results  Component Value Date   CHOL 150 12/24/2019   HDL 47 12/24/2019   LDLCALC 83 12/24/2019   TRIG 109 12/24/2019   CHOLHDL 3.2  12/24/2019    Lab Results  Component Value Date   HGBA1C 8.7 (H) 06/29/2022     Review of Prior Studies    Cath 06/30/2022   Dist RCA lesion is 70% stenosed.   RPAV lesion is 90% stenosed.   Dist LM lesion is 50% stenosed.   Prox Cx lesion is 99% stenosed.   Mid Cx to Dist Cx lesion is 99% stenosed with 99% stenosed side branch in 2nd Mrg.   Mid LAD to Dist LAD lesion is 95% stenosed.   1st Diag lesion is 50% stenosed.   Mid LAD lesion is 80% stenosed.   LV end diastolic pressure is normal.   1.  Mild to moderate distal left main stenosis of 50% 2.  Severe mid LAD stenosis with severe diffuse calcification throughout the mid vessel and stenosis up to 90 to 95% 3.  Subtotal occlusion of the left circumflex with severely diseased obtuse marginal branches 4.  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  I think she has poor targets for grafting and would initially try aggressive medical therapy and antianginal treatment.  The mid to distal LAD appear to be 2 mm in diameter or less and I do not think that branch is suitable for atherectomy and/or PCI.     Diagnostic Dominance: Right     Echo 06/30/2022    1. Left ventricular ejection fraction, by estimation, is 60 to 65%. The  left ventricle has normal function. The left ventricle has no regional  wall motion abnormalities. Left ventricular diastolic parameters are  consistent with Grade I diastolic  dysfunction (impaired relaxation).   2. Right ventricular systolic function is normal. The right ventricular  size is normal.   3. Left atrial size was mildly dilated.   4. The mitral valve is abnormal. Trivial mitral valve regurgitation. No  evidence of mitral stenosis.   5. The aortic valve is tricuspid. There is mild calcification of the  aortic valve. There is mild  thickening of the aortic valve. Aortic valve  regurgitation is not visualized. Aortic valve sclerosis is present, with  no evidence of aortic valve stenosis.   6. The inferior vena cava is normal in size with greater than 50%  respiratory variability, suggesting right atrial pressure of 3 mmHg.  _____________    Assessment & Plan   1.  Severe coronary artery disease: No signs amendable for PCI.  She is treated medically only with nitrates, beta-blocker, DAPT and statin, and has been advised to do nonexertional activities to prevent symptoms.  I have reinforced with her the need to allow her children to help her concerning laundry and anything that is exertional which causes her symptoms.  She verbalizes understanding.  Concerning discomfort in her chest I have increased her Ranexa from 500 mg twice daily to 500 mg in the morning and 1000  mg in the evening.  She complains of feeling her heart racing after taking metoprolol, feeling it skip and then slowing down which does cause discomfort in her chest.  She is to continue the isosorbide 120 mg daily.  2.  Hypertension: Blood pressures currently well-controlled.  No changes in medication regimen.  She is to avoid salty foods and exertional activities which could increase her blood pressure.  3.  Hypercholesterolemia: Continue atorvastatin and Zetia as directed.  No changes in regimen.  4.  Insulin-dependent diabetes: Followed by PCP.  Continue labs at their discretion.        Signed, Bettey Mare. Liborio Nixon, ANP, AACC   09/22/2022 4:19 PM      Office [phone] Fax [phone]  Notice: This dictation was prepared with Dragon dictation along with smaller phrase technology. Any transcriptional errors that result from this process are unintentional and may not be corrected upon review.

## 2022-09-22 ENCOUNTER — Other Ambulatory Visit (HOSPITAL_COMMUNITY): Payer: Self-pay

## 2022-09-22 ENCOUNTER — Telehealth: Payer: Self-pay | Admitting: Pharmacy Technician

## 2022-09-22 ENCOUNTER — Encounter: Payer: Self-pay | Admitting: Adult Health

## 2022-09-22 ENCOUNTER — Ambulatory Visit: Payer: Medicare Other | Attending: Adult Health | Admitting: Adult Health

## 2022-09-22 VITALS — BP 128/72 | HR 59 | Ht 63.5 in | Wt 172.4 lb

## 2022-09-22 DIAGNOSIS — I10 Essential (primary) hypertension: Secondary | ICD-10-CM

## 2022-09-22 DIAGNOSIS — I2510 Atherosclerotic heart disease of native coronary artery without angina pectoris: Secondary | ICD-10-CM

## 2022-09-22 DIAGNOSIS — E118 Type 2 diabetes mellitus with unspecified complications: Secondary | ICD-10-CM | POA: Diagnosis present

## 2022-09-22 DIAGNOSIS — E7800 Pure hypercholesterolemia, unspecified: Secondary | ICD-10-CM

## 2022-09-22 DIAGNOSIS — R002 Palpitations: Secondary | ICD-10-CM

## 2022-09-22 DIAGNOSIS — Z794 Long term (current) use of insulin: Secondary | ICD-10-CM

## 2022-09-22 MED ORDER — RANOLAZINE ER 500 MG PO TB12: 500.0000 mg | ORAL_TABLET | Freq: Two times a day (BID) | ORAL | 1 refills | Status: DC

## 2022-09-22 NOTE — Patient Instructions (Signed)
Medication Instructions:  Increase Ranexa  500 mg to 1500 mg ( Take 1 Tablet In the Morning 500 mg . Take 2 Tablets 1000 mg In The Evening). *If you need a refill on your cardiac medications before your next appointment, please call your pharmacy*   Lab Work: No Labs If you have labs (blood work) drawn today and your tests are completely normal, you will receive your results only by: MyChart Message (if you have MyChart) OR A paper copy in the mail If you have any lab test that is abnormal or we need to change your treatment, we will call you to review the results.   Testing/Procedures: No Testing   Follow-Up: At [facility], you and your health needs are our priority.  As part of our continuing mission to provide you with exceptional heart care, we have created designated Provider Care Teams.  These Care Teams include your primary Cardiologist (physician) and Advanced Practice Providers (APPs -  Physician Assistants and Nurse Practitioners) who all work together to provide you with the care you need, when you need it.  We recommend signing up for the patient portal called "MyChart".  Sign up information is provided on this After Visit Summary.  MyChart is used to connect with patients for Virtual Visits (Telemedicine).  Patients are able to view lab/test results, encounter notes, upcoming appointments, etc.  Non-urgent messages can be sent to your provider as well.   To learn more about what you can do with MyChart, go to [email or web address].    Your next appointment:   1 month(s)  Provider:   Joni Reining, DNP, ANP

## 2022-09-22 NOTE — Telephone Encounter (Signed)
Pharmacy Patient Advocate Encounter   Received notification from CoverMyMeds that prior authorization for Ranexa is required/requested.   Insurance verification completed.   The patient is insured through [employer] .   Per test claim: PA required; PA submitted to [employer] via CoverMyMeds Key/confirmation #/EOC [facility] Status is pending

## 2022-09-22 NOTE — Telephone Encounter (Signed)
//  Pharmacy Patient Advocate Encounter  Received notification from //[employer] that Prior Authorization for Ranexa has been APPROVED from 08/23/22 to 09/22/23. Ran test claim, Copay is $10.35. This test claim was processed through [facility]- copay amounts may vary at other pharmacies due to pharmacy/plan contracts, or as the patient moves through the different stages of their insurance plan.   PA #/Case ID/Reference #: [id number]

## 2022-09-29 ENCOUNTER — Ambulatory Visit: Payer: Medicare Other | Admitting: Neurology

## 2022-10-04 ENCOUNTER — Other Ambulatory Visit: Payer: Self-pay | Admitting: *Deleted

## 2022-10-04 MED ORDER — RANOLAZINE ER 500 MG PO TB12: ORAL_TABLET | ORAL | 3 refills | Status: DC

## 2022-10-07 ENCOUNTER — Ambulatory Visit: Payer: Medicare Other | Admitting: Adult Health

## 2022-10-13 ENCOUNTER — Ambulatory Visit (INDEPENDENT_AMBULATORY_CARE_PROVIDER_SITE_OTHER): Payer: Medicare Other | Admitting: Neurology

## 2022-10-13 ENCOUNTER — Encounter: Payer: Self-pay | Admitting: Neurology

## 2022-10-13 VITALS — BP 144/69 | HR 62 | Ht 63.0 in | Wt 170.8 lb

## 2022-10-13 DIAGNOSIS — Z8673 Personal history of transient ischemic attack (TIA), and cerebral infarction without residual deficits: Secondary | ICD-10-CM | POA: Diagnosis not present

## 2022-10-13 DIAGNOSIS — R413 Other amnesia: Secondary | ICD-10-CM

## 2022-10-13 DIAGNOSIS — G3184 Mild cognitive impairment, so stated: Secondary | ICD-10-CM | POA: Diagnosis not present

## 2022-10-13 NOTE — Patient Instructions (Signed)
I had a long discussion with the patient, son and daughter-in-law regarding her memory loss and mild cognitive impairment as well as remote stroke and answered questions about recent MRI scan and EEG.  I encouraged her to increase participation in cognitively challenging activities like solving crossword puzzles and playing bridge and sudoku.  We also discussed memory compensation strategies.  Continue Plavix for stroke prevention with aggressive risk factor modification with strict control of hypertension with blood pressure goal below 130/90, lipids with LDL cholesterol goal below 70 mg percent and diabetes with hemoglobin A1c goal below 6.5%.  She was advised not to drive due to her peripheral visual loss.  She will return for follow-up in the future in 6 months or call earlier if necessary.  Memory Compensation Strategies  Use "WARM" strategy.  W= write it down  A= associate it  R= repeat it  M= make a mental note  2.   You can keep a Glass blower/designer.  Use a 3-ring notebook with sections for the following: calendar, important names and phone numbers,  medications, doctors' names/phone numbers, lists/reminders, and a section to journal what you did  each day.   3.    Use a calendar to write appointments down.  4.    Write yourself a schedule for the day.  This can be placed on the calendar or in a separate section of the Memory Notebook.  Keeping a  regular schedule can help memory.  5.    Use medication organizer with sections for each day or morning/evening pills.  You may need help loading it  6.    Keep a basket, or pegboard by the door.  Place items that you need to take out with you in the basket or on the pegboard.  You may also want to  include a message board for reminders.  7.    Use sticky notes.  Place sticky notes with reminders in a place where the task is performed.  For example: " turn off the  stove" placed by the stove, "lock the door" placed on the door at eye level, "  take your medications" on  the bathroom mirror or by the place where you normally take your medications.  8.    Use alarms/timers.  Use while cooking to remind yourself to check on food or as a reminder to take your medicine, or as a  reminder to make a call, or as a reminder to perform another task, etc.

## 2022-10-13 NOTE — Progress Notes (Signed)
Yeah left breast for [facility] [address] [place]. [place] [address] [phone]       OFFICE FOLLOW-UP VISIT NOTE  Ms. Jill Jill Date of Birth:  October 12, 1940 Medical Record Number:  [id number]   Referring MD: Katheren Puller, PA-C  Reason for Referral: Memory difficulties and COVID brain fog  HPI: Initial visit 03/28/2022 Jill Jill is a 82 year old pleasant Caucasian lady seen today for consultation for brain fog following COVID.  History is obtained from the patient and review of electronic medical records and care everywhere.  I have also reviewed pertinent available imaging films in PACS. 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  She states she got COVID twice in 2020 the left in December 2023.  Following this she has been feeling fatigued and tired and feels she has brain fog.  He has trouble remembering recent information.  At times she can remember people's first name but not her last name and vice versa.  She forgets what she is doing at times she also has trouble finding words.  She had mild cognitive symptoms following her stroke in 2018 but has improved.  He is also complaining of left lower extremity swelling for which primary physician no compression stockings.  Patient on Plavix which is tolerating well without bleeding or bruising.  She has had no recurrent stroke or TIA symptoms.  He feels the diabetes control has been poor blood sugars have been quite fluctuating.  She blames the COVID in December last year for this.  Blood pressure is much better usually in the 120`s at home but today it is elevated at 146/74 in our office..  She is tolerating  Lipitor well without muscle aches and pains.  Last lab work I have for review today on 12/14/2021 showed LDL cholesterol to be 73 mg percent and hemoglobin A1c was 8.6 on 11/30/2021.  Vitamin B12 level was greater than thousand on 12/14/2021 and TSH was 3.  On aspirin imaging available on 10/28/2021 showing old left parieto-occipital infarct with no acute abnormalities.  She denies any seizures or loss of consciousness.  Only minor tingling in the feet when she is tired but denies significant problems with walking or balance. Update 10/13/2022 : She returns for follow-up after last visit 6 months ago.  She is accompanied by her son and daughter-in-law.  She continues to have mild memory mostly involving short-term difficulties which are unchanged.  She has not been participating in cognitively challenging activities like solving crossword puzzles, playing bridge of sudoku.  She in fact had an episode of choking yesterday and was taken to [facility] ER.  She turned blue and was unresponsive for 5 to 10 minutes.  She returned complete back to normal after they removed the piece of food from her throat.  Patient denies any previous difficulty swallowing or choking.  She has not had any recurrent stroke or TIA symptoms.  She remains on Plavix which is tolerating well without bruising or bleeding.  She is tolerating Lipitor without muscle aches and pains.  She states her sugars and blood pressure is under good control.  She has no new complaints today.  EEG on 04/12/2022 was normal.  MRI scan of the brain  on 05/16/2022 showed old left parietal infarct with encephalomalacia and old left corona radiata lacunar infarct.  No acute findings.  Lab work on 9//24 showed LDL cholesterol 68 mg percent and hemoglobin A1c 7.9.  She underwent cardiac cath in 06/30/2022 and was found to have multivessel disease with was felt to be too risky for revascularization and aggressive medical therapy was recommended by Dr. Excell Seltzer. ROS:   14  system review of systems is positive for memory difficulties, fatigue, tiredness, word finding difficulties, vision difficulties, bruising, numbness all other systems negative  PMH:  Past Medical History:  Diagnosis Date   Diabetes mellitus without complication (HCC)    Gallstones    Hypertension    Kidney stone    Kidney stone    Stroke (HCC) 10/24/1996   Vertigo     Social History:  Social History   Socioeconomic History   Marital status: Married    Spouse name: Not on file   Number of children: Not on file   Years of education: Not on file   Highest education level: Not on file  Occupational History   Not on file  Tobacco Use   Smoking status: Former    Current packs/day: 0.00    Types: Cigarettes    Quit date: 62    Years since quitting: 41.7   Smokeless tobacco: Never  Vaping Use   Vaping status: Never Used  Substance and Sexual Activity   Alcohol use: No   Drug use: No   Sexual activity: Not Currently  Other Topics Concern   Not on file  Social History Narrative   Not on file   Social Determinants of Health   Financial Resource Strain: Not on file  Food Insecurity: Low Risk  (09/27/2022)   Received from [facility]   Hunger Vital Sign    Worried About Running Out of Food in the Last Year: Never true    Ran Out of Food in the Last Year: Never true  Transportation Needs: No Transportation Needs (09/27/2022)   Received from [facility]    In the past 12 months, has lack of reliable transportation kept you from medical appointments, meetings, work or from getting things needed for daily living? : No  Physical Activity: Not on file  Stress: Not on file  Social Connections: Unknown (06/06/2021)   Received from [facility], [facility]   Social Network    Social Network: Not on file  Intimate Partner Violence: Not At Risk (07/17/2022)   Humiliation, Afraid, Rape, and Kick questionnaire    Fear of Current or Ex-Partner: No     Emotionally Abused: No    Physically Abused: No    Sexually Abused: No    Medications:   Current Outpatient Medications on File Prior to Visit  Medication Sig Dispense Refill   acetaminophen (TYLENOL) 500 MG tablet Take 500-2,000 mg by mouth every 6 (six) hours as needed for moderate pain (Pain).     amLODipine (NORVASC) 5 MG tablet Take 1 tablet (5 mg total) by mouth daily. 90 tablet 2   aspirin EC 81 MG tablet Take 1 tablet (81 mg total) by mouth daily. Swallow whole. 30 tablet 1   atorvastatin (LIPITOR) 80 MG tablet Take 1 tablet (80 mg total) by mouth at bedtime. 90 tablet 3   cetirizine (ZYRTEC) 10 MG tablet Take 10 mg by mouth at bedtime as needed for allergies or rhinitis.     Cholecalciferol (VITAMIN D) 50 MCG (2000 UT)  tablet Take 1 tablet (2,000 Units total) by mouth daily.     clopidogrel (PLAVIX) 75 MG tablet Take 1 tablet (75 mg total) by mouth daily. 90 tablet 3   Cyanocobalamin (VITAMIN B-12 PO) Take 1 tablet by mouth daily.     empagliflozin (JARDIANCE) 10 MG TABS tablet Take 1 tablet by mouth daily.     ezetimibe (ZETIA) 10 MG tablet Take 1 tablet (10 mg total) by mouth daily. 90 tablet 3   glimepiride (AMARYL) 4 MG tablet Take 4 mg by mouth 2 (two) times daily.     glimepiride (AMARYL) 4 MG tablet Take 4 mg by mouth daily with breakfast.     HUMALOG KWIKPEN 100 UNIT/ML KwikPen Inject 0-22 Units into the skin 4 (four) times daily.     Insulin Glargine (TOUJEO SOLOSTAR [place]) Inject 56 Units into the skin at bedtime.     isosorbide mononitrate (IMDUR) 120 MG 24 hr tablet Take 1 tablet (120 mg total) by mouth every morning. 30 tablet 6   Loratadine (CLARITIN PO) Take 1 tablet by mouth daily as needed (for allergies).     Magnesium 250 MG TABS Take 250 mg by mouth daily.     metFORMIN (GLUCOPHAGE) 1000 MG tablet Take 1,000 mg by mouth 2 (two) times daily with a meal.     metoprolol succinate (TOPROL-XL) 50 MG 24 hr tablet Take 3 tablets (150 mg total) by mouth at bedtime. 270  tablet 5   montelukast (SINGULAIR) 10 MG tablet Take 10 mg by mouth at bedtime.     nitroGLYCERIN (NITROSTAT) 0.4 MG SL tablet Place 1 tablet (0.4 mg total) under the tongue every 5 (five) minutes x 3 doses as needed for chest pain. 25 tablet 3   Omega-3 Fatty Acids (FISH OIL) 1000 MG CAPS Take 1,000 mg by mouth daily.      pantoprazole (PROTONIX) 40 MG tablet Take 40 mg by mouth daily as needed (Acid reflux).     ranolazine (RANEXA) 500 MG 12 hr tablet Take one (1) tablet by mouth ( 500 mg) in the am and two (2) tablets by mouth ( 1000 mg ) in the evening. 270 tablet 3   No current facility-administered medications on file prior to visit.    Allergies:   Allergies  Allergen Reactions   Influenza Vaccines Swelling    Arm was swollen in 2002    Sitagliptin Swelling   Guaifenesin Other (See Comments)    Unknown   Hydroxychloroquine Other (See Comments)    Eye pain   Latex Other (See Comments)    Unknown   Nitrofurantoin Nausea Only   Other Other (See Comments)    Band-Aid   Phenylephrine-Pheniramine-Dm Other (See Comments)    Unknown   Ultram [Tramadol Hcl] Other (See Comments)    hallucinations   Escitalopram Oxalate Other (See Comments)    Hallucinations    Physical Exam General: Mildly obese pleasant elderly Caucasian lady, seated, in no evident distress Head: head normocephalic and atraumatic.   Neck: supple with no carotid or supraclavicular bruits Cardiovascular: regular rate and rhythm, no murmurs Musculoskeletal: no deformity Skin:  no rash/petichiae Vascular:  Normal pulses all extremities  Neurologic Exam Mental Status: Awake and fully alert. Oriented to place and time. Recent and remote memory intact. Attention span, concentration and fund of knowledge appropriate. Mood and affect appropriate.  Diminished recall 1/3, able to name only 10 animals which can walk on 4 legs.  Mental status exam however scored 28/30.  Marland Kitchen  Clock drawing 4/4. Cranial Nerves: Fundoscopic  exam reveals sharp disc margins. Pupils equal, briskly reactive to light. Extraocular movements full without nystagmus. Visual fields show partial right homonymous hemianopsia to confrontation. Hearing intact. Facial sensation intact.  Mild right lower facial asymmetry tongue, palate moves normally and symmetrically.  Motor: Normal bulk and tone. Normal strength in all tested extremity muscles. Sensory.: intact to touch , pinprick , position and vibratory sensation.  Coordination: Rapid alternating movements normal in all extremities. Finger-to-nose and heel-to-shin performed accurately bilaterally. Gait and Station: Arises from chair without difficulty. Stance is normal. Gait demonstrates normal stride length and balance .  Not able to heel, toe and tandem walk without difficulty.  Reflexes: 1+ and symmetric. Toes downgoing.   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  Remote history of left MCA infarct with intracranial stenosis in October 2018 with mild residual right-sided hemianopsia and word finding difficulties   Vascular risk factors of diabetes, hypertension, hyperlipidemia, mild obesity and intracranial stenosis     PLAN:I had a long discussion with the patient, son and daughter-in-law regarding her memory loss and mild cognitive impairment as well as remote stroke and answered questions about recent MRI scan and EEG.  I  encouraged her to increase participation in cognitively challenging activities like solving crossword puzzles and playing bridge and sudoku.  We also discussed memory compensation strategies.  Continue Plavix for stroke prevention with aggressive risk factor modification with strict control of hypertension with blood pressure goal below 130/90, lipids with LDL cholesterol goal below 70 mg percent and diabetes with hemoglobin A1c goal below 6.5%.  She was advised not to drive due to her peripheral visual loss.  She will return for follow-up in the future in 6 months or call earlier if necessary.Greater than 50% time during this 35-minute  visit was spent on counseling and coordination of care about her memory loss and fatigue post-COVID and prior history of stroke and answering questions  Delia Heady, MD Note: This document was prepared with digital dictation and possible smart phrase technology. Any transcriptional errors that result from this process are unintentional.

## 2022-11-10 NOTE — Progress Notes (Deleted)
Cardiology Clinic Note   Patient Name: Jill Howard Date of Encounter: 11/10/2022  Primary Care Provider:  Shon Baton, PA-C Primary Cardiologist:  Nanetta Batty, MD  Patient Profile    82 y.o. female with  hx of multivessel CAD, hypertension, hyperlipidemia, fibromyalgia, vascular dementia, type 2 diabetes, CVA.  Patient was admitted on June 5 for unstable angina underwent cardiac catheterization that revealed complex multivessel disease with plans to manage it medically as there were no viable areas for intervention and concerns for perforation or for PCI outcomes.  Frequent admissions for recurrent unstable angina.  Past Medical History    Past Medical History:  Diagnosis Date   Diabetes mellitus without complication (HCC)    Gallstones    Hypertension    Kidney stone    Kidney stone    Stroke (HCC) 10/24/1996   Vertigo    Past Surgical History:  Procedure Laterality Date   ABDOMINAL HYSTERECTOMY     KIDNEY STONE SURGERY     LEFT HEART CATH AND CORONARY ANGIOGRAPHY N/A 06/30/2022   Procedure: LEFT HEART CATH AND CORONARY ANGIOGRAPHY;  Surgeon: Tonny Bollman, MD;  Location: [facility] INVASIVE CV LAB;  Service: Cardiovascular;  Laterality: N/A;    Allergies  Allergies  Allergen Reactions   Influenza Vaccines Swelling    Arm was swollen in 2002    Sitagliptin Swelling   Guaifenesin Other (See Comments)    Unknown   Hydroxychloroquine Other (See Comments)    Eye pain   Latex Other (See Comments)    Unknown   Nitrofurantoin Nausea Only   Other Other (See Comments)    Band-Aid   Phenylephrine-Pheniramine-Dm Other (See Comments)    Unknown   Ultram [Tramadol Hcl] Other (See Comments)    hallucinations   Escitalopram Oxalate Other (See Comments)    Hallucinations    History of Present Illness    Jill Howard returns today for ongoing assessment and management of chronic angina in the setting of severe coronary artery disease with no sites amendable for PCI,  hypertension, hypercholesterolemia.  On last office visit I increased her Ranexa to 500 mg in the morning and 1000 mg in the p.m.  Home Medications    Current Outpatient Medications  Medication Sig Dispense Refill   acetaminophen (TYLENOL) 500 MG tablet Take 500-2,000 mg by mouth every 6 (six) hours as needed for moderate pain (Pain).     amLODipine (NORVASC) 5 MG tablet Take 1 tablet (5 mg total) by mouth daily. 90 tablet 2   aspirin EC 81 MG tablet Take 1 tablet (81 mg total) by mouth daily. Swallow whole. 30 tablet 1   atorvastatin (LIPITOR) 80 MG tablet Take 1 tablet (80 mg total) by mouth at bedtime. 90 tablet 3   cetirizine (ZYRTEC) 10 MG tablet Take 10 mg by mouth at bedtime as needed for allergies or rhinitis.     Cholecalciferol (VITAMIN D) 50 MCG (2000 UT) tablet Take 1 tablet (2,000 Units total) by mouth daily.     clopidogrel (PLAVIX) 75 MG tablet Take 1 tablet (75 mg total) by mouth daily. 90 tablet 3   Cyanocobalamin (VITAMIN B-12 PO) Take 1 tablet by mouth daily.     ezetimibe (ZETIA) 10 MG tablet Take 1 tablet (10 mg total) by mouth daily. 90 tablet 3   glimepiride (AMARYL) 4 MG tablet Take 4 mg by mouth 2 (two) times daily.     glimepiride (AMARYL) 4 MG tablet Take 4 mg by mouth daily with breakfast.  HUMALOG KWIKPEN 100 UNIT/ML KwikPen Inject 0-22 Units into the skin 4 (four) times daily.     Insulin Glargine (TOUJEO SOLOSTAR [place]) Inject 56 Units into the skin at bedtime.     isosorbide mononitrate (IMDUR) 120 MG 24 hr tablet Take 1 tablet (120 mg total) by mouth every morning. 30 tablet 6   Loratadine (CLARITIN PO) Take 1 tablet by mouth daily as needed (for allergies).     Magnesium 250 MG TABS Take 250 mg by mouth daily.     metFORMIN (GLUCOPHAGE) 1000 MG tablet Take 1,000 mg by mouth 2 (two) times daily with a meal.     metoprolol succinate (TOPROL-XL) 50 MG 24 hr tablet Take 3 tablets (150 mg total) by mouth at bedtime. 270 tablet 5   montelukast (SINGULAIR) 10 MG  tablet Take 10 mg by mouth at bedtime.     nitroGLYCERIN (NITROSTAT) 0.4 MG SL tablet Place 1 tablet (0.4 mg total) under the tongue every 5 (five) minutes x 3 doses as needed for chest pain. 25 tablet 3   Omega-3 Fatty Acids (FISH OIL) 1000 MG CAPS Take 1,000 mg by mouth daily.      pantoprazole (PROTONIX) 40 MG tablet Take 40 mg by mouth daily as needed (Acid reflux).     ranolazine (RANEXA) 500 MG 12 hr tablet Take one (1) tablet by mouth ( 500 mg) in the am and two (2) tablets by mouth ( 1000 mg ) in the evening. 270 tablet 3   No current facility-administered medications for this visit.     Family History    Family History  Problem Relation Age of Onset   Stroke Other    Heart attack Other    Cancer Other    Diabetes Other    Stroke Mother    Heart attack Mother    Stroke Maternal Aunt    Stroke Paternal Aunt    Rheum arthritis Paternal Aunt    Stroke Brother    Heart attack Brother    COPD Brother    Rheum arthritis Paternal Aunt    Diabetes Son    Heart disease Son    Diabetes Daughter    She indicated that her mother is deceased. She indicated that her father is deceased. She indicated that her brother is deceased. She indicated that her daughter is alive. She indicated that her son is alive. She indicated that her maternal aunt is deceased. She indicated that both of her paternal aunts are deceased. She indicated that the status of her other is unknown.  Social History    Social History   Socioeconomic History   Marital status: Married    Spouse name: Not on file   Number of children: Not on file   Years of education: Not on file   Highest education level: Not on file  Occupational History   Not on file  Tobacco Use   Smoking status: Former    Current packs/day: 0.00    Types: Cigarettes    Quit date: 63    Years since quitting: 41.8   Smokeless tobacco: Never  Vaping Use   Vaping status: Never Used  Substance and Sexual Activity   Alcohol use: No    Drug use: No   Sexual activity: Not Currently  Other Topics Concern   Not on file  Social History Narrative   Not on file   Social Determinants of Health   Financial Resource Strain: Not on file  Food Insecurity: Low Risk  (  09/27/2022)   Received from [facility]   Hunger Vital Sign    Worried About Running Out of Food in the Last Year: Never true    Ran Out of Food in the Last Year: Never true  Transportation Needs: No Transportation Needs (09/27/2022)   Received from [facility]    In the past 12 months, has lack of reliable transportation kept you from medical appointments, meetings, work or from getting things needed for daily living? : No  Physical Activity: Not on file  Stress: Not on file  Social Connections: Unknown (06/06/2021)   Received from [facility], [facility]   Social Network    Social Network: Not on file  Intimate Partner Violence: Not At Risk (07/17/2022)   Humiliation, Afraid, Rape, and Kick questionnaire    Fear of Current or Ex-Partner: No    Emotionally Abused: No    Physically Abused: No    Sexually Abused: No     Review of Systems    General:  No chills, fever, night sweats or weight changes.  Cardiovascular:  No chest pain, dyspnea on exertion, edema, orthopnea, palpitations, paroxysmal nocturnal dyspnea. Dermatological: No rash, lesions/masses Respiratory: No cough, dyspnea Urologic: No hematuria, dysuria Abdominal:   No nausea, vomiting, diarrhea, bright red blood per rectum, melena, or hematemesis Neurologic:  No visual changes, wkns, changes in mental status. All other systems reviewed and are otherwise negative except as noted above.       Physical Exam    VS:  There were no vitals taken for this visit. , BMI There is no height or weight on file to calculate BMI.     GEN: Well nourished, well developed, in no acute distress. HEENT: normal. Neck: Supple, no JVD, carotid bruits, or masses. Cardiac: RRR, no murmurs,  rubs, or gallops. No clubbing, cyanosis, edema.  Radials/DP/PT 2+ and equal bilaterally.  Respiratory:  Respirations regular and unlabored, clear to auscultation bilaterally. GI: Soft, nontender, nondistended, BS + x 4. MS: no deformity or atrophy. Skin: warm and dry, no rash. Neuro:  Strength and sensation are intact. Psych: Normal affect.      Lab Results  Component Value Date   WBC 6.8 09/06/2022   HGB 12.0 09/06/2022   HCT 38.2 09/06/2022   MCV 94.6 09/06/2022   PLT 230 09/06/2022   Lab Results  Component Value Date   CREATININE 1.01 (H) 09/06/2022   BUN 25 (H) 09/06/2022   NA 134 (L) 09/06/2022   K 4.6 09/06/2022   CL 103 09/06/2022   CO2 19 (L) 09/06/2022   Lab Results  Component Value Date   ALT 25 07/16/2022   AST 19 07/16/2022   ALKPHOS 71 07/16/2022   BILITOT 0.8 07/16/2022   Lab Results  Component Value Date   CHOL 150 12/24/2019   HDL 47 12/24/2019   LDLCALC 83 12/24/2019   TRIG 109 12/24/2019   CHOLHDL 3.2 12/24/2019    Lab Results  Component Value Date   HGBA1C 8.7 (H) 06/29/2022     Review of Prior Studies    Cath 06/30/2022   Dist RCA lesion is 70% stenosed.   RPAV lesion is 90% stenosed.   Dist LM lesion is 50% stenosed.   Prox Cx lesion is 99% stenosed.   Mid Cx to Dist Cx lesion is 99% stenosed with 99% stenosed side branch in 2nd Mrg.   Mid LAD to Dist LAD lesion is 95% stenosed.   1st Diag lesion is 50% stenosed.  Mid LAD lesion is 80% stenosed.   LV end diastolic pressure is normal.   1.  Mild to moderate distal left main stenosis of 50% 2.  Severe mid LAD stenosis with severe diffuse calcification throughout the mid vessel and stenosis up to 90 to 95% 3.  Subtotal occlusion of the left circumflex with severely diseased obtuse marginal branches 4.  Moderately severe distal RCA stenosis with severe stenosis at the ostium of the posterior AV branch   Recommendations: The patient has very severe diffuse coronary calcification with  severe multivessel CAD.  I think she has poor targets for grafting and would initially try aggressive medical therapy and antianginal treatment.  The mid to distal LAD appear to be 2 mm in diameter or less and I do not think that branch is suitable for atherectomy and/or PCI.     Diagnostic Dominance: Right     Echo 06/30/2022  Echocardiogram 09/22/2022  1. Left ventricular ejection fraction, by estimation, is 60 to 65%. The  left ventricle has normal function. The left ventricle has no regional  wall motion abnormalities. Left ventricular diastolic parameters are  consistent with Grade I diastolic  dysfunction (impaired relaxation).   2. Right ventricular systolic function is normal. The right ventricular  size is normal.   3. Left atrial size was mildly dilated.   4. The mitral valve is abnormal. Trivial mitral valve regurgitation. No  evidence of mitral stenosis.   5. The aortic valve is tricuspid. There is mild calcification of the  aortic valve. There is mild thickening of the aortic valve. Aortic valve  regurgitation is not visualized. Aortic valve sclerosis is present, with  no evidence of aortic valve stenosis.   6. The inferior vena cava is normal in size with greater than 50%  respiratory variability, suggesting right atrial pressure of 3 mmHg.   Assessment & Plan   1.  ***     {Are you ordering a CV Procedure (e.g. stress test, cath, DCCV, TEE, etc)?   Press F2        :[id number]}   Signed, Bettey Mare. Liborio Nixon, ANP, AACC   11/10/2022 8:19 AM      Office [phone] Fax [phone]  Notice: This dictation was prepared with Dragon dictation along with smaller phrase technology. Any transcriptional errors that result from this process are unintentional and may not be corrected upon review.

## 2022-11-11 ENCOUNTER — Ambulatory Visit: Payer: Medicare Other | Attending: Adult Health | Admitting: Adult Health

## 2022-12-07 ENCOUNTER — Emergency Department (HOSPITAL_COMMUNITY)
Admission: EM | Admit: 2022-12-07 | Discharge: 2022-12-08 | Disposition: A | Discharge status: Home / Self Care | Payer: Medicare Other | Attending: Emergency Medicine | Admitting: Emergency Medicine

## 2022-12-07 DIAGNOSIS — Z7982 Long term (current) use of aspirin: Secondary | ICD-10-CM | POA: Insufficient documentation

## 2022-12-07 DIAGNOSIS — Z7902 Long term (current) use of antithrombotics/antiplatelets: Secondary | ICD-10-CM | POA: Insufficient documentation

## 2022-12-07 DIAGNOSIS — R079 Chest pain, unspecified: Secondary | ICD-10-CM | POA: Diagnosis present

## 2022-12-07 DIAGNOSIS — Z91040 Latex allergy status: Secondary | ICD-10-CM | POA: Diagnosis not present

## 2022-12-07 DIAGNOSIS — R0789 Other chest pain: Secondary | ICD-10-CM | POA: Insufficient documentation

## 2022-12-07 DIAGNOSIS — I25119 Atherosclerotic heart disease of native coronary artery with unspecified angina pectoris: Secondary | ICD-10-CM | POA: Diagnosis not present

## 2022-12-08 ENCOUNTER — Emergency Department (HOSPITAL_COMMUNITY): Payer: Medicare Other

## 2022-12-08 ENCOUNTER — Observation Stay (HOSPITAL_COMMUNITY)
Admission: EM | Admit: 2022-12-08 | Discharge: 2022-12-10 | Disposition: A | Discharge status: Home / Self Care | Payer: Medicare Other | Attending: Family Medicine | Admitting: Family Medicine

## 2022-12-08 ENCOUNTER — Encounter (HOSPITAL_COMMUNITY): Payer: Self-pay | Admitting: Emergency Medicine

## 2022-12-08 ENCOUNTER — Other Ambulatory Visit: Payer: Self-pay

## 2022-12-08 ENCOUNTER — Encounter (HOSPITAL_COMMUNITY): Payer: Self-pay

## 2022-12-08 DIAGNOSIS — Z8673 Personal history of transient ischemic attack (TIA), and cerebral infarction without residual deficits: Secondary | ICD-10-CM

## 2022-12-08 DIAGNOSIS — E782 Mixed hyperlipidemia: Secondary | ICD-10-CM

## 2022-12-08 DIAGNOSIS — Z794 Long term (current) use of insulin: Secondary | ICD-10-CM

## 2022-12-08 DIAGNOSIS — Z7982 Long term (current) use of aspirin: Secondary | ICD-10-CM | POA: Insufficient documentation

## 2022-12-08 DIAGNOSIS — I10 Essential (primary) hypertension: Secondary | ICD-10-CM | POA: Diagnosis not present

## 2022-12-08 DIAGNOSIS — I200 Unstable angina: Principal | ICD-10-CM | POA: Diagnosis present

## 2022-12-08 DIAGNOSIS — Z91040 Latex allergy status: Secondary | ICD-10-CM | POA: Diagnosis not present

## 2022-12-08 DIAGNOSIS — E785 Hyperlipidemia, unspecified: Secondary | ICD-10-CM | POA: Diagnosis present

## 2022-12-08 DIAGNOSIS — Z79899 Other long term (current) drug therapy: Secondary | ICD-10-CM | POA: Insufficient documentation

## 2022-12-08 DIAGNOSIS — Z7902 Long term (current) use of antithrombotics/antiplatelets: Secondary | ICD-10-CM | POA: Diagnosis not present

## 2022-12-08 DIAGNOSIS — I25110 Atherosclerotic heart disease of native coronary artery with unstable angina pectoris: Principal | ICD-10-CM | POA: Insufficient documentation

## 2022-12-08 DIAGNOSIS — Z7984 Long term (current) use of oral hypoglycemic drugs: Secondary | ICD-10-CM | POA: Insufficient documentation

## 2022-12-08 DIAGNOSIS — E119 Type 2 diabetes mellitus without complications: Secondary | ICD-10-CM | POA: Diagnosis not present

## 2022-12-08 DIAGNOSIS — R0789 Other chest pain: Secondary | ICD-10-CM | POA: Diagnosis present

## 2022-12-08 LAB — COMPREHENSIVE METABOLIC PANEL
ALT: 17 U/L (ref 0–44)
AST: 22 U/L (ref 15–41)
Albumin: 3.1 g/dL — ABNORMAL LOW (ref 3.5–5.0)
Alkaline Phosphatase: 68 U/L (ref 38–126)
Anion gap: 14 (ref 5–15)
BUN: 40 mg/dL — ABNORMAL HIGH (ref 8–23)
CO2: 16 mmol/L — ABNORMAL LOW (ref 22–32)
Calcium: 9.2 mg/dL (ref 8.9–10.3)
Chloride: 103 mmol/L (ref 98–111)
Creatinine, Ser: 0.97 mg/dL (ref 0.44–1.00)
GFR, Estimated: 59 mL/min — ABNORMAL LOW (ref >60)
Glucose, Bld: 181 mg/dL — ABNORMAL HIGH (ref 70–99)
Potassium: 4.1 mmol/L (ref 3.5–5.1)
Sodium: 133 mmol/L — ABNORMAL LOW (ref 135–145)
Total Bilirubin: 0.4 mg/dL (ref <1.2)
Total Protein: 6.5 g/dL (ref 6.5–8.1)

## 2022-12-08 LAB — BASIC METABOLIC PANEL
Anion gap: 10 (ref 5–15)
Anion gap: 11 (ref 5–15)
BUN: 39 mg/dL — ABNORMAL HIGH (ref 8–23)
BUN: 27 mg/dL — ABNORMAL HIGH (ref 8–23)
CO2: 20 mmol/L — ABNORMAL LOW (ref 22–32)
CO2: 18 mmol/L — ABNORMAL LOW (ref 22–32)
Calcium: 9 mg/dL (ref 8.9–10.3)
Calcium: 9.6 mg/dL (ref 8.9–10.3)
Chloride: 103 mmol/L (ref 98–111)
Chloride: 107 mmol/L (ref 98–111)
Creatinine, Ser: 0.94 mg/dL (ref 0.44–1.00)
Creatinine, Ser: 0.87 mg/dL (ref 0.44–1.00)
GFR, Estimated: >60 mL/min (ref >60)
GFR, Estimated: >60 mL/min (ref >60)
Glucose, Bld: 194 mg/dL — ABNORMAL HIGH (ref 70–99)
Glucose, Bld: 311 mg/dL — ABNORMAL HIGH (ref 70–99)
Potassium: 4.4 mmol/L (ref 3.5–5.1)
Potassium: 5.2 mmol/L — ABNORMAL HIGH (ref 3.5–5.1)
Sodium: 133 mmol/L — ABNORMAL LOW (ref 135–145)
Sodium: 136 mmol/L (ref 135–145)

## 2022-12-08 LAB — TROPONIN I (HIGH SENSITIVITY)
Troponin I (High Sensitivity): 17 ng/L (ref <18)
Troponin I (High Sensitivity): 17 ng/L (ref <18)
Troponin I (High Sensitivity): 25 ng/L — ABNORMAL HIGH (ref <18)
Troponin I (High Sensitivity): 26 ng/L — ABNORMAL HIGH (ref <18)
Troponin I (High Sensitivity): 9 ng/L (ref <18)

## 2022-12-08 LAB — CBC WITH DIFFERENTIAL/PLATELET
Abs Immature Granulocytes: 0.03 10*3/uL (ref 0.00–0.07)
Basophils Absolute: 0.1 10*3/uL (ref 0.0–0.1)
Basophils Relative: 1 %
Eosinophils Absolute: 0.3 10*3/uL (ref 0.0–0.5)
Eosinophils Relative: 3 %
HCT: 39.2 % (ref 36.0–46.0)
Hemoglobin: 12.4 g/dL (ref 12.0–15.0)
Immature Granulocytes: 0 %
Lymphocytes Relative: 36 %
Lymphs Abs: 2.9 10*3/uL (ref 0.7–4.0)
MCH: 29.7 pg (ref 26.0–34.0)
MCHC: 31.6 g/dL (ref 30.0–36.0)
MCV: 94 fL (ref 80.0–100.0)
Monocytes Absolute: 0.6 10*3/uL (ref 0.1–1.0)
Monocytes Relative: 8 %
Neutro Abs: 4.2 10*3/uL (ref 1.7–7.7)
Neutrophils Relative %: 52 %
Platelets: 209 10*3/uL (ref 150–400)
RBC: 4.17 MIL/uL (ref 3.87–5.11)
RDW: 13.6 % (ref 11.5–15.5)
WBC: 8.1 10*3/uL (ref 4.0–10.5)
nRBC: 0 % (ref 0.0–0.2)

## 2022-12-08 LAB — CBC
HCT: 38 % (ref 36.0–46.0)
Hemoglobin: 12.1 g/dL (ref 12.0–15.0)
MCH: 29.3 pg (ref 26.0–34.0)
MCHC: 31.8 g/dL (ref 30.0–36.0)
MCV: 92 fL (ref 80.0–100.0)
Platelets: 196 10*3/uL (ref 150–400)
RBC: 4.13 MIL/uL (ref 3.87–5.11)
RDW: 13.8 % (ref 11.5–15.5)
WBC: 5.6 10*3/uL (ref 4.0–10.5)
nRBC: 0 % (ref 0.0–0.2)

## 2022-12-08 LAB — BETA-HYDROXYBUTYRIC ACID: Beta-Hydroxybutyric Acid: 0.6 mmol/L — ABNORMAL HIGH (ref 0.05–0.27)

## 2022-12-08 LAB — CBG MONITORING, ED: Glucose-Capillary: 214 mg/dL — ABNORMAL HIGH (ref 70–99)

## 2022-12-08 MED ORDER — RANOLAZINE ER 500 MG PO TB12: 1000.0000 mg | ORAL_TABLET | Freq: Two times a day (BID) | ORAL | 0 refills | Status: DC

## 2022-12-08 MED ORDER — METFORMIN HCL 500 MG PO TABS
1000.0000 mg | ORAL_TABLET | Freq: Two times a day (BID) | ORAL | Status: DC
  Administered 2022-12-08: 1000 mg via ORAL
  Filled 2022-12-08: qty 2

## 2022-12-08 MED ORDER — ONDANSETRON HCL 4 MG/2ML IJ SOLN
4.0000 mg | Freq: Once | INTRAMUSCULAR | Status: AC
  Administered 2022-12-08: 4 mg via INTRAVENOUS
  Filled 2022-12-08: qty 2

## 2022-12-08 MED ORDER — ISOSORBIDE MONONITRATE ER 30 MG PO TB24
120.0000 mg | ORAL_TABLET | Freq: Every morning | ORAL | Status: DC
  Administered 2022-12-08: 120 mg via ORAL
  Filled 2022-12-08: qty 4

## 2022-12-08 MED ORDER — GLIMEPIRIDE 4 MG PO TABS
4.0000 mg | ORAL_TABLET | Freq: Every day | ORAL | Status: DC
  Administered 2022-12-08: 4 mg via ORAL
  Filled 2022-12-08: qty 1

## 2022-12-08 MED ORDER — NITROGLYCERIN IN D5W 200-5 MCG/ML-% IV SOLN
0.0000 ug/min | INTRAVENOUS | Status: DC
  Administered 2022-12-08: 5 ug/min via INTRAVENOUS
  Filled 2022-12-08: qty 250

## 2022-12-08 MED ORDER — NITROGLYCERIN 0.2 MG/HR TD PT24
0.2000 mg | MEDICATED_PATCH | Freq: Every day | TRANSDERMAL | Status: DC
  Filled 2022-12-08: qty 1

## 2022-12-08 MED ORDER — AMLODIPINE BESYLATE 5 MG PO TABS
5.0000 mg | ORAL_TABLET | Freq: Every day | ORAL | Status: DC
  Administered 2022-12-08: 5 mg via ORAL
  Filled 2022-12-08: qty 1

## 2022-12-08 MED ORDER — FENTANYL CITRATE PF 50 MCG/ML IJ SOSY
50.0000 ug | PREFILLED_SYRINGE | Freq: Once | INTRAMUSCULAR | Status: AC
  Administered 2022-12-08: 50 ug via INTRAVENOUS
  Filled 2022-12-08: qty 1

## 2022-12-08 MED ORDER — AMLODIPINE BESYLATE 5 MG PO TABS: 5.0000 mg | ORAL_TABLET | Freq: Every day | ORAL | Status: DC

## 2022-12-08 MED ORDER — SODIUM CHLORIDE 0.9 % IV BOLUS
1000.0000 mL | Freq: Once | INTRAVENOUS | Status: AC
  Administered 2022-12-08: 1000 mL via INTRAVENOUS

## 2022-12-08 MED ORDER — PANTOPRAZOLE SODIUM 40 MG IV SOLR
40.0000 mg | Freq: Once | INTRAVENOUS | Status: AC
  Administered 2022-12-08: 40 mg via INTRAVENOUS
  Filled 2022-12-08: qty 10

## 2022-12-08 MED ORDER — RANOLAZINE ER 500 MG PO TB12
500.0000 mg | ORAL_TABLET | Freq: Once | ORAL | Status: AC
  Administered 2022-12-08: 500 mg via ORAL
  Filled 2022-12-08: qty 1

## 2022-12-08 MED ORDER — INSULIN ASPART 100 UNIT/ML IJ SOLN
0.0000 [IU] | INTRAMUSCULAR | Status: DC
Start: 2022-12-09 — End: 2022-12-09
  Administered 2022-12-08: 3 [IU] via SUBCUTANEOUS
  Administered 2022-12-09: 5 [IU] via SUBCUTANEOUS
  Administered 2022-12-09: 2 [IU] via SUBCUTANEOUS
  Administered 2022-12-09: 1 [IU] via SUBCUTANEOUS

## 2022-12-08 MED ORDER — RANOLAZINE ER 500 MG PO TB12: ORAL_TABLET | ORAL | 3 refills | Status: DC

## 2022-12-08 NOTE — Assessment & Plan Note (Addendum)
Continue isosorbide at 120 mg Toprol at 150 mg at bedtime Ranexa thousand twice a day Monitor BP to avoid hypotension

## 2022-12-08 NOTE — Assessment & Plan Note (Signed)
-   Order Sensitive  SSI     -  check TSH and HgA1C  - Hold by mouth medications*  

## 2022-12-08 NOTE — ED Triage Notes (Signed)
Pt endorses chest pain that started around 1900.  States pain was in central chest and radiated to back and bilat shoulders.  Pt took one SL nitroglycerin at that time which helped.  Pt states pain came back at later time so she took 2 more SL nitroglycerin and called EMS.  Pt given 1 more dose of SL nitroglycerin en route.  States these doses provided intermittent relief.  Currently reports 5/10 chest pain that radiates to back/shoulders and up neck to her jaws.

## 2022-12-08 NOTE — ED Notes (Signed)
ED TO INPATIENT HANDOFF REPORT  ED Nurse Name and Phone #:  Minerva Areola [phone]  S Name/Age/Gender Jill Howard 82 y.o. female Room/Bed: 030C/030C  Code Status   Code Status: Prior  Home/SNF/Other Home Patient oriented to: self, place, time, and situation Is this baseline? Yes   Triage Complete: Triage complete  Chief Complaint Unstable angina (HCC) [I20.0]  Triage Note Pt reports midsternal chest pain that started approx 1 hour ago while sitting in her recliner. Pt was discharged this morning for same.     Allergies Allergies  Allergen Reactions   Influenza Vaccines Swelling    Arm was swollen in 2002    Sitagliptin Swelling   Guaifenesin Other (See Comments)    Unknown   Hydroxychloroquine Other (See Comments)    Eye pain   Latex Other (See Comments)    Unknown   Nickel Other (See Comments)    Causes swelling of the arms   Nitrofurantoin Nausea Only   Other Other (See Comments)    Band-Aid   Phenylephrine-Pheniramine-Dm Other (See Comments)    Unknown; causes issues with blood pressure   Ultram [Tramadol Hcl] Other (See Comments)    hallucinations   Wound Dressing Adhesive Itching   Escitalopram Oxalate Other (See Comments)    Hallucinations    Level of Care/Admitting Diagnosis ED Disposition     ED Disposition  Admit   Condition  --   Comment  Hospital Area: [facility] [[id number]]  Level of Care: Progressive [102]  Admit to Progressive based on following criteria: CARDIOVASCULAR & THORACIC of moderate stability with acute coronary syndrome symptoms/low risk myocardial infarction/hypertensive urgency/arrhythmias/heart failure potentially compromising stability and stable post cardiovascular intervention patients.  May place patient in observation at [facility] or [facility] if equivalent level of care is available:: No  Covid Evaluation: Symptomatic Person Under Investigation (PUI) or recent exposure (last 10 days) *Testing Required*   Diagnosis: Unstable angina [facility]) [[id number]]  Admitting Physician: Therisa Doyne [[id number]]  Attending Physician: Therisa Doyne [[id number]]          B Medical/Surgery History Past Medical History:  Diagnosis Date   Diabetes mellitus without complication (HCC)    Gallstones    History of stroke 10/25/2016   Hypertension    Kidney stone    Kidney stone    Stroke (HCC) 10/24/1996   Vertigo    Past Surgical History:  Procedure Laterality Date   ABDOMINAL HYSTERECTOMY     KIDNEY STONE SURGERY     LEFT HEART CATH AND CORONARY ANGIOGRAPHY N/A 06/30/2022   Procedure: LEFT HEART CATH AND CORONARY ANGIOGRAPHY;  Surgeon: Tonny Bollman, MD;  Location: [facility] INVASIVE CV LAB;  Service: Cardiovascular;  Laterality: N/A;     A IV Location/Drains/Wounds Patient Lines/Drains/Airways Status     Active Line/Drains/Airways     Name Placement date Placement time Site Days   Peripheral IV 12/08/22 20 G Anterior;Distal;Right Forearm 12/08/22  2122  Forearm  less than 1            Intake/Output Last 24 hours No intake or output data in the 24 hours ending 12/08/22 2348  Labs/Imaging Results for orders placed or performed during the hospital encounter of 12/08/22 (from the past 48 hour(s))  Basic metabolic panel     Status: Abnormal   Collection Time: 12/08/22  9:20 PM  Result Value Ref Range   Sodium 136 135 - 145 mmol/L   Potassium 5.2 (H) 3.5 - 5.1 mmol/L   Chloride 107 98 - 111  mmol/L   CO2 18 (L) 22 - 32 mmol/L   Glucose, Bld 311 (H) 70 - 99 mg/dL    Comment: Glucose reference range applies only to samples taken after fasting for at least 8 hours.   BUN 27 (H) 8 - 23 mg/dL   Creatinine, Ser 1.61 0.44 - 1.00 mg/dL   Calcium 9.6 8.9 - 09.6 mg/dL   GFR, Estimated >04 >54 mL/min    Comment: (NOTE) Calculated using the CKD-EPI Creatinine Equation (2021)    Anion gap 11 5 - 15    Comment: Performed at [facility], [address] [place], [place] [address]  CBC      Status: None   Collection Time: 12/08/22  9:20 PM  Result Value Ref Range   WBC 5.6 4.0 - 10.5 K/uL   RBC 4.13 3.87 - 5.11 MIL/uL   Hemoglobin 12.1 12.0 - 15.0 g/dL   HCT 91.4 78.2 - 95.6 %   MCV 92.0 80.0 - 100.0 fL   MCH 29.3 26.0 - 34.0 pg   MCHC 31.8 30.0 - 36.0 g/dL   RDW 21.3 08.6 - 57.8 %   Platelets 196 150 - 400 K/uL   nRBC 0.0 0.0 - 0.2 %    Comment: Performed at [facility], [address] [place], [place] [address]  Troponin I (High Sensitivity)     Status: None   Collection Time: 12/08/22  9:20 PM  Result Value Ref Range   Troponin I (High Sensitivity) 17 <18 ng/L    Comment: (NOTE) Elevated high sensitivity troponin I (hsTnI) values and significant  changes across serial measurements may suggest ACS but many other  chronic and acute conditions are known to elevate hsTnI results.  Refer to the Links section for chest pain algorithms and additional  guidance. Performed at [facility], [address], [place], [place] [address]   Troponin I (High Sensitivity)     Status: Abnormal   Collection Time: 12/08/22 10:49 PM  Result Value Ref Range   Troponin I (High Sensitivity) 25 (H) <18 ng/L    Comment: (NOTE) Elevated high sensitivity troponin I (hsTnI) values and significant  changes across serial measurements may suggest ACS but many other  chronic and acute conditions are known to elevate hsTnI results.  Refer to the "Links" section for chest pain algorithms and additional  guidance. Performed at [facility], [address], [place], [place] [address]   CBG monitoring, ED     Status: Abnormal   Collection Time: 12/08/22 11:45 PM  Result Value Ref Range   Glucose-Capillary 214 (H) 70 - 99 mg/dL    Comment: Glucose reference range applies only to samples taken after fasting for at least 8 hours.   DG Chest 2 View  Result Date: 12/08/2022 CLINICAL DATA:  Chest pain EXAM: CHEST - 2 VIEW COMPARISON:  12/08/2022 FINDINGS: The heart size and  mediastinal contours are within normal limits. Both lungs are clear. The visualized skeletal structures are unremarkable. Aortic atherosclerosis. IMPRESSION: No active cardiopulmonary disease. Electronically Signed   By: Jasmine Pang M.D.   On: 12/08/2022 23:19   DG Chest 2 View  Result Date: 12/08/2022 CLINICAL DATA:  Chest pain. EXAM: CHEST - 2 VIEW COMPARISON:  September 06, 2022 FINDINGS: The heart size and mediastinal contours are within normal limits. There is mild calcification of the aortic arch and tortuosity of the descending thoracic aorta. Both lungs are clear. Multilevel degenerative changes are seen throughout  the thoracic spine. IMPRESSION: No active cardiopulmonary disease. Electronically Signed   By: Aram Candela M.D.   On: 12/08/2022 01:10    Pending Labs Unresulted Labs (From admission, onward)     Start     Ordered   12/08/22 2342  Hemoglobin A1c  Add-on,   AD       Comments: To assess prior glycemic control    12/08/22 2341            Vitals/Pain Today's Vitals   12/08/22 2101 12/08/22 2104 12/08/22 2219 12/08/22 2220  BP:      Pulse:      Resp:      Temp:      TempSrc:      SpO2:  97%    Weight:    77.5 kg  Height:    5\' 3"  (1.6 m)  PainSc: 6   6      Isolation Precautions No active isolations  Medications Medications  insulin aspart (novoLOG) injection 0-9 Units (has no administration in time range)    Mobility walks     Focused Assessments Cardiac Assessment Handoff:  Cardiac Rhythm: Normal sinus rhythm Lab Results  Component Value Date   TROPONINI <0.03 10/24/2016   Lab Results  Component Value Date   DDIMER 0.94 (H) 06/29/2022   Does the Patient currently have chest pain? No    R Recommendations: See Admitting Provider Note  Report given to:   Additional Notes: coopertive, family member helpful, here this a.m. for same issue

## 2022-12-08 NOTE — ED Triage Notes (Signed)
Pt reports midsternal chest pain that started approx 1 hour ago while sitting in her recliner. Pt was discharged this morning for same.

## 2022-12-08 NOTE — Progress Notes (Addendum)
Hs trop 9 >17 >26. EKG no acute changes. Patient is seen at bedside, feels well, no further chest pain, VS stable. Plan to increase Ranexa to 1000mg  BID. Patient and family agreeable. Update provided to ER provider.   No evidence of acute coronary syndrome (marginally elevated troponin). Increase ranolazine. Making follow up arrangements.

## 2022-12-08 NOTE — ED Notes (Signed)
Pt taken to xray 

## 2022-12-08 NOTE — Subjective & Objective (Signed)
Patient presents for chest pain with known history of CAD history of unstable angina she walked out in a cold and then started to have some sharp chest pain today central and radiating to both sides no shortness of breath got better with nitro every time she would walk or it starts again and then improve again with rest and nitro over a period of time she ended up taking between 4-5 sublingual nitros no fevers no chills

## 2022-12-08 NOTE — Discharge Instructions (Signed)
Please increase the Ranexa to 1000 mg twice per day as discussed and follow-up with your cardiologist.  Return to the ER for worsening symptoms.

## 2022-12-08 NOTE — ED Provider Notes (Signed)
[place] EMERGENCY DEPARTMENT AT [facility] Provider Note   CSN: [id number] Arrival date & time: 12/07/22  2339     History  Chief Complaint  Patient presents with   Chest Pain    Jill Howard is a 82 y.o. female.  82 year old female who presents ER today secondary to chest pain.  Patient has known significant coronary artery disease but not really amenable to routine stenting.  She has had a couple hospitalizations and ER visits in the past secondary to unstable angina.  She has been pretty stable since then but then tonight when she was walking out in the cold which is caused her problems before she started having some sharp chest pain.  Seem to be central in nature radiate both sides.  No associated shortness of breath.  Breathing did not make it worse.  Radiate towards the back.  Got better with nitro and rest but every time she would walk after that it would flareup again and then get better with rest and nitro.  Ultimately took 4 or 5 sublingual nitroglycerin prior to coming in and stump some mild chest pain right now.  No recent fevers or cough.  No trauma.  No other associated symptoms.  States he feels exactly the way it used to with her anginal episodes in the past.   Chest Pain      Home Medications Prior to Admission medications   Medication Sig Start Date End Date Taking? Authorizing Provider  acetaminophen (TYLENOL) 500 MG tablet Take 500-2,000 mg by mouth every 6 (six) hours as needed for moderate pain (Pain).    [provider]  amLODipine (NORVASC) 5 MG tablet Take 1 tablet (5 mg total) by mouth daily. 08/22/22   Jodelle Gross, NP  aspirin EC 81 MG tablet Take 1 tablet (81 mg total) by mouth daily. Swallow whole. 07/02/22   Burnadette Pop, MD  atorvastatin (LIPITOR) 80 MG tablet Take 1 tablet (80 mg total) by mouth at bedtime. 08/22/22   Jodelle Gross, NP  cetirizine (ZYRTEC) 10 MG tablet Take 10 mg by mouth at bedtime as needed for  allergies or rhinitis.    [provider]  Cholecalciferol (VITAMIN D) 50 MCG (2000 UT) tablet Take 1 tablet (2,000 Units total) by mouth daily. 07/01/22   Burnadette Pop, MD  clopidogrel (PLAVIX) 75 MG tablet Take 1 tablet (75 mg total) by mouth daily. 08/22/22   Jodelle Gross, NP  Cyanocobalamin (VITAMIN B-12 PO) Take 1 tablet by mouth daily.    [provider]  ezetimibe (ZETIA) 10 MG tablet Take 1 tablet (10 mg total) by mouth daily. 08/22/22   Jodelle Gross, NP  glimepiride (AMARYL) 4 MG tablet Take 4 mg by mouth daily with breakfast. 10/03/22 04/01/23  [provider]  HUMALOG KWIKPEN 100 UNIT/ML KwikPen Inject 0-22 Units into the skin 4 (four) times daily. 06/25/20   [provider]  Insulin Glargine (TOUJEO SOLOSTAR Lyman) Inject 56 Units into the skin at bedtime.    [provider]  isosorbide mononitrate (IMDUR) 120 MG 24 hr tablet Take 1 tablet (120 mg total) by mouth every morning. 09/07/22   Abagail Kitchens, PA-C  Loratadine (CLARITIN PO) Take 1 tablet by mouth daily as needed (for allergies).    [provider]  Magnesium 250 MG TABS Take 250 mg by mouth daily.    [provider]  metFORMIN (GLUCOPHAGE) 1000 MG tablet Take 1,000 mg by mouth 2 (two) times daily  with a meal.    [provider]  metoprolol succinate (TOPROL-XL) 50 MG 24 hr tablet Take 3 tablets (150 mg total) by mouth at bedtime. 08/22/22   Jodelle Gross, NP  montelukast (SINGULAIR) 10 MG tablet Take 10 mg by mouth at bedtime.    [provider]  nitroGLYCERIN (NITROSTAT) 0.4 MG SL tablet Place 1 tablet (0.4 mg total) under the tongue every 5 (five) minutes x 3 doses as needed for chest pain. 08/22/22   Jodelle Gross, NP  Omega-3 Fatty Acids (FISH OIL) 1000 MG CAPS Take 1,000 mg by mouth daily.     [provider]  pantoprazole (PROTONIX) 40 MG tablet Take 40 mg by mouth daily as needed (Acid reflux).    [provider]  ranolazine (RANEXA) 500 MG 12 hr tablet Take one (1) tablet by mouth ( 500 mg) in the am and two (2) tablets by mouth ( 1000 mg ) in the evening. 10/04/22   Jodelle Gross, NP      Allergies    Influenza vaccines, Sitagliptin, Guaifenesin, Hydroxychloroquine, Latex, Nitrofurantoin, Other, Phenylephrine-pheniramine-dm, Ultram [tramadol hcl], and Escitalopram oxalate    Review of Systems   Review of Systems  Cardiovascular:  Positive for chest pain.    Physical Exam Updated Vital Signs BP (!) 119/46   Pulse 63   Temp 97.8 F (36.6 C) (Oral)   Resp 18   Ht 5\' 3"  (1.6 m)   Wt 77.5 kg   SpO2 95%   BMI 30.27 kg/m  Physical Exam Vitals and nursing note reviewed.  Constitutional:      Appearance: She is well-developed.  HENT:     Head: Normocephalic and atraumatic.  Cardiovascular:     Rate and Rhythm: Normal rate and regular rhythm.  Pulmonary:     Effort: No respiratory distress.     Breath sounds: No stridor.  Chest:     Chest wall: Tenderness (to parasternal region and somewhat replicates pain but not exactly) present.  Abdominal:     General: There is no distension.  Musculoskeletal:     Cervical back: Normal range of motion.  Neurological:     Mental Status: She is alert.     ED Results / Procedures / Treatments   Labs (all labs ordered are listed, but only abnormal results are displayed) Labs Reviewed  COMPREHENSIVE METABOLIC PANEL - Abnormal; Notable for the following components:      Result Value   Sodium 133 (*)    CO2 16 (*)    Glucose, Bld 181 (*)    BUN 40 (*)    Albumin 3.1 (*)    GFR, Estimated 59 (*)    All other components within normal limits  CBC WITH DIFFERENTIAL/PLATELET  BASIC METABOLIC PANEL  BETA-HYDROXYBUTYRIC ACID  TROPONIN I (HIGH SENSITIVITY)  TROPONIN I (HIGH SENSITIVITY)    EKG EKG Interpretation Date/Time:  Thursday December 08 2022 00:41:27 EST Ventricular Rate:  71 PR Interval:  216 QRS Duration:  97 QT  Interval:  443 QTC Calculation: 482 R Axis:   0  Text Interpretation: Sinus rhythm Borderline prolonged PR interval Confirmed by Marily Memos [phone]) on 12/08/2022 12:48:36 AM  Radiology DG Chest 2 View  Result Date: 12/08/2022 CLINICAL DATA:  Chest pain. EXAM: CHEST - 2 VIEW COMPARISON:  September 06, 2022 FINDINGS: The heart size and mediastinal contours are within normal limits. There is mild calcification of the aortic arch and tortuosity of the descending thoracic aorta. Both lungs  are clear. Multilevel degenerative changes are seen throughout the thoracic spine. IMPRESSION: No active cardiopulmonary disease. Electronically Signed   By: Aram Candela M.D.   On: 12/08/2022 01:10    Procedures Procedures    Medications Ordered in ED Medications  sodium chloride 0.9 % bolus 1,000 mL (has no administration in time range)  fentaNYL (SUBLIMAZE) injection 50 mcg (50 mcg Intravenous Given 12/08/22 0107)  ondansetron (ZOFRAN) injection 4 mg (4 mg Intravenous Given 12/08/22 0246)  pantoprazole (PROTONIX) injection 40 mg (40 mg Intravenous Given 12/08/22 0248)    ED Course/ Medical Decision Making/ A&P                                 Medical Decision Making Amount and/or Complexity of Data Reviewed Labs: ordered. Radiology: ordered. ECG/medicine tests: ordered.  Risk Prescription drug management. Decision regarding hospitalization.   Unclear etiology seems somewhat atypical however it is described to be exactly the same, exertional, better with nitroglycerin.  Will check troponins and EKG.  Treat her symptoms.  For troponin negative.  Patient with emesis after the fentanyl so improved with Zofran.  Nitroglycerin initiated as that seemed to be only thing that helped her symptoms although it is at a very low dose.  Second troponin reassuring.  Will discuss with cardiology.  Discussed with cardiology and they have seen the patient, note pending, they think it is likely anginal  pain but she is much better now on a very low-dose of nitroglycerin who recommends to shut it off altogether.  He recommends rechecking BMP and a beta hydroxybutyric acid to ensure she is not euglycemic DKA from her Jardiance.  Recommend checking a third troponin.  Will watch her off the nitroglycerin and see how she does.  He is going to pass on to the day team for them to reevaluate for ultimate disposition but likely discharge with increasing her Ranexa and cardiac rehab as an outpatient.  Patient and son aware.  All questions answered.  Will order daytime medications aside from the Wheeler.  Repeat bmp shows improved bicarb, still elevated BUN of unclear etiology, bhb is 0.60. low suspicion for DKA. Thir trop pending, formal cards consult pending. Care transferred pending same.    Final Clinical Impression(s) / ED Diagnoses Final diagnoses:  None    Rx / DC Orders ED Discharge Orders     None         Jaycub Noorani, Barbara Cower, MD 12/08/22 [phone]

## 2022-12-08 NOTE — H&P (Signed)
Jill Howard XBJ:[id number] DOB: 03-17-1940 DOA: 12/08/2022     PCP: Shon Baton, PA-C   Outpatient Specialists:   CARDS:   Dr. Nanetta Batty, MD   Patient arrived to ER on 12/08/22 at 2052 Referred by Attending Franne Forts, DO   Patient coming from:    home Lives  With family    Chief Complaint:   Chief Complaint  Patient presents with   Chest Pain    HPI: Jill Howard is a 82 y.o. female with medical history significant of  severe non-revascularized multivessel CAD,    HTN, HLD, prior CVA, DMII and fibromyalgia     Presented with chest pain Patient presents for chest pain with known history of CAD history of unstable angina she walked out in a cold and then started to have some sharp chest pain today central and radiating to both sides no shortness of breath got better with nitro every time she would walk or it starts again and then improve again with rest and nitro over a period of time she ended up taking between 4-5 sublingual nitros no fevers no chills No associated diaphoresis no nausea no vomiting no abdominal pain no palpitation no syncope  Pt was seen originally in AM and was seen by cardiology   Plan to increase Ranexa to 1000mg  BID and she was able to be originaly discharged but came back in 8h with severe CP Now still have dull pressure but much better  Denies significant ETOH intake   Does not smoke   Lab Results  Component Value Date   SARSCOV2NAA NEGATIVE 05/25/2020    Regarding pertinent Chronic problems:     Hyperlipidemia - on statins Lipitor (atorvastatin) Zetia Lipid Panel     Component Value Date/Time   CHOL 150 12/24/2019 0840   TRIG 109 12/24/2019 0840   HDL 47 12/24/2019 0840   CHOLHDL 3.2 12/24/2019 0840   CHOLHDL 3.5 03/24/2017 0643   VLDL 32 03/24/2017 0643   LDLCALC 83 12/24/2019 0840   LABVLDL 20 12/24/2019 0840     HTN on Norvasc Imdur Toprol   chronic CHF diastolic/ - last echo  Recent Results (from the past  43800 hour(s))  ECHOCARDIOGRAM COMPLETE   Collection Time: 06/30/22  2:02 PM  Result Value   Weight 2,832.47   BP 137/61   S' Lateral 3.20   Area-P 1/2 4.00   Est EF 60 - 65%   Narrative      ECHOCARDIOGRAM REPORT     IMPRESSIONS    1. Left ventricular ejection fraction, by estimation, is 60 to 65%. The left ventricle has normal function. The left ventricle has no regional wall motion abnormalities. Left ventricular diastolic parameters are consistent with Grade I diastolic  dysfunction (impaired relaxation).  2. Right ventricular systolic function is normal. The right ventricular size is normal.  3. Left atrial size was mildly dilated.  4. The mitral valve is abnormal. Trivial mitral valve regurgitation. No evidence of mitral stenosis.  5. The aortic valve is tricuspid. There is mild calcification of the aortic valve. There is mild thickening of the aortic valve. Aortic valve regurgitation is not visualized. Aortic valve sclerosis is present, with no evidence of aortic valve stenosis.  6. The inferior vena cava is normal in size with greater than 50% respiratory variability, suggesting right atrial pressure of 3 mmHg.             CAD  - On Aspirin, statin, betablocker, Plavix Ranexa                -  followed by cardiology               Medical management only       DM 2 -  Lab Results  Component Value Date   HGBA1C 8.7 (H) 06/29/2022   on  insulin     While in ER:  Troponin stable Chest pain controlled with nitro drip Cardiology was consulted     Lab Orders         Basic metabolic panel         CBC      CXR -  NON acute    Following Medications were ordered in ER: Medications - No data to display  _______________________________________________________ ER Provider Called:    Cardiology Dr. Lendell Caprice They Recommend admit to medicine    SEEN in ER      ED Triage Vitals  Encounter Vitals Group     BP 12/08/22 2057 (!) 140/75     Systolic BP Percentile --       Diastolic BP Percentile --      Pulse Rate 12/08/22 2057 70     Resp 12/08/22 2057 18     Temp 12/08/22 2057 98.5 F (36.9 C)     Temp Source 12/08/22 2057 Oral     SpO2 12/08/22 2057 100 %     Weight 12/08/22 2220 170 lb 13.7 oz (77.5 kg)     Height 12/08/22 2220 5\' 3"  (1.6 m)     Head Circumference --      Peak Flow --      Pain Score 12/08/22 2101 6     Pain Loc --      Pain Education --      Exclude from Growth Chart --   ZOXW(96)@     _________________________________________ Significant initial  Findings: Abnormal Labs Reviewed  BASIC METABOLIC PANEL - Abnormal; Notable for the following components:      Result Value   Potassium 5.2 (*)    CO2 18 (*)    Glucose, Bld 311 (*)    BUN 27 (*)    All other components within normal limits    _________________________ Troponin  ordered Cardiac Panel (last 3 results) Recent Labs    12/08/22 0540 12/08/22 2120 12/08/22 2249  TROPONINIHS 26* 17 25*     ECG: Ordered Personally reviewed and interpreted by me showing: HR : 73 Rhythm:  Normal sinus rhythm Normal ECG QTC 467  BNP (last 3 results) Recent Labs    06/29/22 2350  BNP 99.9     COVID-19 Labs  No results for input(s): "DDIMER", "FERRITIN", "LDH", "CRP" in the last 72 hours.  Lab Results  Component Value Date   SARSCOV2NAA NEGATIVE 05/25/2020     The recent clinical data is shown below. Vitals:   12/08/22 2057 12/08/22 2104 12/08/22 2220  BP: (!) 140/75    Pulse: 70    Resp: 18    Temp: 98.5 F (36.9 C)    TempSrc: Oral    SpO2: 100% 97%   Weight:   77.5 kg  Height:   5\' 3"  (1.6 m)    WBC     Component Value Date/Time   WBC 5.6 12/08/2022 2120   LYMPHSABS 2.9 12/08/2022 0049   MONOABS 0.6 12/08/2022 0049   EOSABS 0.3 12/08/2022 0049   BASOSABS 0.1 12/08/2022 0049        UA  ordered  __________________________________________________________ Recent Labs  Lab 12/08/22 0049 12/08/22 0540 12/08/22 2120  NA 133* 133*  136  K  4.1 4.4 5.2*  CO2 16* 20* 18*  GLUCOSE 181* 194* 311*  BUN 40* 39* 27*  CREATININE 0.97 0.94 0.87  CALCIUM 9.2 9.0 9.6    Cr   stable,   Lab Results  Component Value Date   CREATININE 0.87 12/08/2022   CREATININE 0.94 12/08/2022   CREATININE 0.97 12/08/2022    Recent Labs  Lab 12/08/22 0049  AST 22  ALT 17  ALKPHOS 68  BILITOT 0.4  PROT 6.5  ALBUMIN 3.1*   Lab Results  Component Value Date   CALCIUM 9.6 12/08/2022    Plt: Lab Results  Component Value Date   PLT 196 12/08/2022    Recent Labs  Lab 12/08/22 0049 12/08/22 2120  WBC 8.1 5.6  NEUTROABS 4.2  --   HGB 12.4 12.1  HCT 39.2 38.0  MCV 94.0 92.0  PLT 209 196    HG/HCT  stable,      Component Value Date/Time   HGB 12.1 12/08/2022 2120   HCT 38.0 12/08/2022 2120   MCV 92.0 12/08/2022 2120    _______________________________________________ Hospitalist was called for admission for   Unstable angina     The following Work up has been ordered so far:  Orders Placed This Encounter  Procedures   DG Chest 2 View   Basic metabolic panel   CBC   ED Cardiac monitoring   Inpatient consult to Cardiology   Consult for [facility] Admission   EKG 12-Lead   EKG 12-Lead     OTHER Significant initial  Findings:  labs showing:     DM  labs:  HbA1C: Recent Labs    06/29/22 2350  HGBA1C 8.7*    CBG (last 3)  No results for input(s): "GLUCAP" in the last 72 hours.        Cultures: No results found for: "SDES", "SPECREQUEST", "CULT", "REPTSTATUS"   Radiological Exams on Admission: DG Chest 2 View  Result Date: 12/08/2022 CLINICAL DATA:  Chest pain EXAM: CHEST - 2 VIEW COMPARISON:  12/08/2022 FINDINGS: The heart size and mediastinal contours are within normal limits. Both lungs are clear. The visualized skeletal structures are unremarkable. Aortic atherosclerosis. IMPRESSION: No active cardiopulmonary disease. Electronically Signed   By: Jasmine Pang M.D.   On: 12/08/2022 23:19   DG  Chest 2 View  Result Date: 12/08/2022 CLINICAL DATA:  Chest pain. EXAM: CHEST - 2 VIEW COMPARISON:  September 06, 2022 FINDINGS: The heart size and mediastinal contours are within normal limits. There is mild calcification of the aortic arch and tortuosity of the descending thoracic aorta. Both lungs are clear. Multilevel degenerative changes are seen throughout the thoracic spine. IMPRESSION: No active cardiopulmonary disease. Electronically Signed   By: Aram Candela M.D.   On: 12/08/2022 01:10   _______________________________________________________________________________________________________ Latest  Blood pressure (!) 140/75, pulse 70, temperature 98.5 F (36.9 C), temperature source Oral, resp. rate 18, height 5\' 3"  (1.6 m), weight 77.5 kg, SpO2 97%.   Vitals  labs and radiology finding personally reviewed  Review of Systems:    Pertinent positives include:  chest pain,   Constitutional:  No weight loss, night sweats, Fevers, chills, fatigue, weight loss  HEENT:  No headaches, Difficulty swallowing,Tooth/dental problems,Sore throat,  No sneezing, itching, ear ache, nasal congestion, post nasal drip,  Cardio-vascular:  No Orthopnea, PND, anasarca, dizziness, palpitations.no Bilateral lower extremity swelling  GI:  No heartburn, indigestion, abdominal pain, nausea, vomiting, diarrhea, change in bowel habits, loss of appetite, melena, blood in  stool, hematemesis Resp:  no shortness of breath at rest. No dyspnea on exertion, No excess mucus, no productive cough, No non-productive cough, No coughing up of blood.No change in color of mucus.No wheezing. Skin:  no rash or lesions. No jaundice GU:  no dysuria, change in color of urine, no urgency or frequency. No straining to urinate.  No flank pain.  Musculoskeletal:  No joint pain or no joint swelling. No decreased range of motion. No back pain.  Psych:  No change in mood or affect. No depression or anxiety. No memory loss.   Neuro: no localizing neurological complaints, no tingling, no weakness, no double vision, no gait abnormality, no slurred speech, no confusion  All systems reviewed and apart from HOPI all are negative _______________________________________________________________________________________________ Past Medical History:   Past Medical History:  Diagnosis Date   Diabetes mellitus without complication (HCC)    Gallstones    Hypertension    Kidney stone    Kidney stone    Stroke (HCC) 10/24/1996   Vertigo      Past Surgical History:  Procedure Laterality Date   ABDOMINAL HYSTERECTOMY     KIDNEY STONE SURGERY     LEFT HEART CATH AND CORONARY ANGIOGRAPHY N/A 06/30/2022   Procedure: LEFT HEART CATH AND CORONARY ANGIOGRAPHY;  Surgeon: Tonny Bollman, MD;  Location: [facility] INVASIVE CV LAB;  Service: Cardiovascular;  Laterality: N/A;    Social History:  Ambulatory   independently or  wheelchair bound,     reports that she quit smoking about 41 years ago. Her smoking use included cigarettes. She has never used smokeless tobacco. She reports that she does not drink alcohol and does not use drugs.   Family History:   Family History  Problem Relation Age of Onset   Stroke Other    Heart attack Other    Cancer Other    Diabetes Other    Stroke Mother    Heart attack Mother    Stroke Maternal Aunt    Stroke Paternal Aunt    Rheum arthritis Paternal Aunt    Stroke Brother    Heart attack Brother    COPD Brother    Rheum arthritis Paternal Aunt    Diabetes Son    Heart disease Son    Diabetes Daughter    ______________________________________________________________________________________________ Allergies: Allergies  Allergen Reactions   Influenza Vaccines Swelling    Arm was swollen in 2002    Sitagliptin Swelling   Guaifenesin Other (See Comments)    Unknown   Hydroxychloroquine Other (See Comments)    Eye pain   Latex Other (See Comments)    Unknown   Nickel Other (See  Comments)    Causes swelling of the arms   Nitrofurantoin Nausea Only   Other Other (See Comments)    Band-Aid   Phenylephrine-Pheniramine-Dm Other (See Comments)    Unknown; causes issues with blood pressure   Ultram [Tramadol Hcl] Other (See Comments)    hallucinations   Wound Dressing Adhesive Itching   Escitalopram Oxalate Other (See Comments)    Hallucinations     Prior to Admission medications   Medication Sig Start Date End Date Taking? Authorizing Provider  acetaminophen (TYLENOL) 500 MG tablet Take 1,000 mg by mouth every 6 (six) hours as needed for moderate pain (pain score 4-6) (Pain).    [provider]  amLODipine (NORVASC) 5 MG tablet Take 1 tablet (5 mg total) by mouth daily. 08/22/22   Jodelle Gross, NP  aspirin EC 81 MG tablet  Take 1 tablet (81 mg total) by mouth daily. Swallow whole. 07/02/22   Burnadette Pop, MD  atorvastatin (LIPITOR) 80 MG tablet Take 1 tablet (80 mg total) by mouth at bedtime. 08/22/22   Jodelle Gross, NP  cetirizine (ZYRTEC) 10 MG tablet Take 10 mg by mouth at bedtime as needed for allergies or rhinitis.    [provider]  Cholecalciferol (VITAMIN D) 50 MCG (2000 UT) tablet Take 1 tablet (2,000 Units total) by mouth daily. 07/01/22   Burnadette Pop, MD  clopidogrel (PLAVIX) 75 MG tablet Take 1 tablet (75 mg total) by mouth daily. 08/22/22   Jodelle Gross, NP  Cyanocobalamin (VITAMIN B-12 PO) Take 1 tablet by mouth daily.    [provider]  ezetimibe (ZETIA) 10 MG tablet Take 1 tablet (10 mg total) by mouth daily. 08/22/22   Jodelle Gross, NP  glimepiride (AMARYL) 4 MG tablet Take 4 mg by mouth daily with breakfast. 10/03/22 04/01/23  [provider]  HUMALOG KWIKPEN 100 UNIT/ML KwikPen Inject 5-22 Units into the skin as needed. Use at lunchtime if levels are still high; use sliding scale after up to a maximum of 22 units per day 06/25/20   [provider]  Insulin Glargine (TOUJEO SOLOSTAR  [place]) Inject 56 Units into the skin at bedtime.    [provider]  isosorbide mononitrate (IMDUR) 120 MG 24 hr tablet Take 1 tablet (120 mg total) by mouth every morning. 09/07/22   Abagail Kitchens, PA-C  JARDIANCE 10 MG TABS tablet Take 10 mg by mouth daily. 11/11/22   [provider]  Loratadine (CLARITIN PO) Take 1 tablet by mouth daily as needed (for allergies).    [provider]  Magnesium 250 MG TABS Take 250 mg by mouth daily.    [provider]  metFORMIN (GLUCOPHAGE) 1000 MG tablet Take 1,000 mg by mouth 2 (two) times daily with a meal.    [provider]  metoprolol succinate (TOPROL-XL) 50 MG 24 hr tablet Take 3 tablets (150 mg total) by mouth at bedtime. 08/22/22   Jodelle Gross, NP  nitroGLYCERIN (NITROSTAT) 0.4 MG SL tablet Place 1 tablet (0.4 mg total) under the tongue every 5 (five) minutes x 3 doses as needed for chest pain. 08/22/22   Jodelle Gross, NP  Omega-3 Fatty Acids (FISH OIL) 1000 MG CAPS Take 1,000 mg by mouth daily.     [provider]  ranolazine (RANEXA) 500 MG 12 hr tablet Take 2 tablets (1,000 mg total) by mouth 2 (two) times daily. 12/08/22   Durwin Glaze, MD  ranolazine (RANEXA) 500 MG 12 hr tablet Take one (1) tablet by mouth ( 500 mg) in the am and two (2) tablets by mouth ( 1000 mg ) in the evening. 12/08/22   Durwin Glaze, MD    ___________________________________________________________________________________________________ Physical Exam:    12/08/2022   10:20 PM 12/08/2022    8:57 PM 12/08/2022   11:53 AM  Vitals with BMI  Height 5\' 3"     Weight 170 lbs 14 oz    BMI 30.27    Systolic  140 120  Diastolic  75 62  Pulse  70 68     1. General:  in No  Acute distress   Chronically ill-appearing 2. Psychological: Alert and   Oriented 3. Head/ENT:     Dry Mucous Membranes  Head Non traumatic, neck supple                           Poor Dentition 4. SKIN:   decreased Skin turgor,  Skin clean Dry and intact no rash    5. Heart: Regular rate and rhythm no  Murmur, no Rub or gallop 6. Lungs  no wheezes or crackles   7. Abdomen: Soft,  non-tender, Non distended   obese  bowel sounds present 8. Lower extremities: no clubbing, cyanosis, no  edema 9. Neurologically Grossly intact, moving all 4 extremities equally  10. MSK: Normal range of motion    Chart has been reviewed  ______________________________________________________________________________________________  Assessment/Plan 82 y.o. female with medical history significant of  severe non-revascularized multivessel CAD,    HTN, HLD, prior CVA, DMII and fibromyalgia   Admitted for   Unstable angina (HCC)     Present on Admission:  Unstable angina (HCC)  Essential hypertension  Hyperlipidemia    Unstable angina [facility]) Appreciate cardiology consult At this point chest pain improving we will continue to monitor troponins remained stable cardiology did not feel that heparin was needed at this time.  Supportive management Patient has known significant CAD which not amendable to stenting Palliative care consult  DM2 (diabetes mellitus, type 2) (HCC)  - Order Sensitive  SSI    -  check TSH and HgA1C  - Hold by mouth medications    Essential hypertension Continue isosorbide at 120 mg Toprol at 150 mg at bedtime Ranexa thousand twice a day Monitor BP to avoid hypotension  Hyperlipidemia Continue Lipitor 80 mg a day  History of stroke Continue Lipitor 80 mg a day continue aspirin Plavix    Other plan as per orders.  DVT prophylaxis:   Lovenox       Code Status:  DNR/DNI as per family  I had personally discussed CODE STATUS with patient and family  ACP   none    Family Communication:   Family   at  Bedside  plan of care was discussed  with  Daughter,   Diet  heart healthy   Disposition Plan:  To home once workup is complete and patient is stable   Following barriers for  discharge:                             Chest pain   work up is complete                            Electrolytes corrected                                                          Will need consultants to evaluate patient prior to discharge                                         Palliative care    consulted   Consults called: Cardiology is aware    Admission status:  ED Disposition     ED Disposition  Admit   Condition  --   Comment  Hospital  Area: [facility] [[id number]]  Level of Care: Progressive [102]  Admit to Progressive based on following criteria: CARDIOVASCULAR & THORACIC of moderate stability with acute coronary syndrome symptoms/low risk myocardial infarction/hypertensive urgency/arrhythmias/heart failure potentially compromising stability and stable post cardiovascular intervention patients.  May place patient in observation at [facility] or [facility] if equivalent level of care is available:: No  Covid Evaluation: Symptomatic Person Under Investigation (PUI) or recent exposure (last 10 days) *Testing Required*  Diagnosis: Unstable angina [facility]) [[id number]]  Admitting Physician: Therisa Doyne [[id number]]  Attending Physician: Therisa Doyne [[id number]]          Obs      Level of care   progressive       tele indefinitely please discontinue once patient no longer qualifies COVID-19 Labs   Lab Results  Component Value Date   SARSCOV2NAA NEGATIVE 05/25/2020     Laurenashley Viar 12/09/2022, 12:25 AM    [facility]     after 2 AM please page floor coverage PA If 7AM-7PM, please contact the day team taking care of the patient using [email or web address]

## 2022-12-08 NOTE — Assessment & Plan Note (Signed)
Continue Lipitor 80 mg a day.

## 2022-12-08 NOTE — ED Provider Notes (Signed)
[place] EMERGENCY DEPARTMENT AT [facility] Provider Note   CSN: [id number] Arrival date & time: 12/08/22  2052     History  Chief Complaint  Patient presents with   Chest Pain    Jill Howard is a 82 y.o. female.  Patient is an 82 year old female with past medical history of hypertension, diabetes, hyperlipidemia, and coronary artery disease presenting for complaints of chest pain.  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  Patient was a poor candidate for surgical intervention and recommended for medical management.  Patient was seen in the emergency department already in the last 24 hours for complaints of chest pain.  She had minimally elevated troponins and an otherwise stable workup.  She was seen by cardiology and discharged with recommendations for a medication change- increase Ranexa to 1000mg  BID.  She returned home and had chest pain upon walking but did not improve with rest.  She had significant worsening of chest pain upon returning home with no improvement of symptoms after nitroglycerin x 3.  The history is provided by the patient. No language interpreter was used.  Chest Pain Associated symptoms: no abdominal pain, no back pain, no cough, no fever, no palpitations, no shortness of breath and no vomiting        Home Medications Prior to Admission medications   Medication Sig Start Date End Date Taking? Authorizing Provider  acetaminophen (TYLENOL) 500 MG tablet Take 1,000 mg by mouth every 6 (six) hours as needed for moderate pain (pain score 4-6) (Pain).    [provider]  amLODipine (NORVASC) 5 MG tablet Take 1 tablet (5 mg total) by mouth daily. 08/22/22   Jodelle Gross, NP  aspirin EC 81 MG tablet Take 1 tablet (81 mg total) by mouth daily. Swallow whole. 07/02/22   Burnadette Pop, MD  atorvastatin (LIPITOR) 80 MG tablet Take 1 tablet (80 mg  total) by mouth at bedtime. 08/22/22   Jodelle Gross, NP  cetirizine (ZYRTEC) 10 MG tablet Take 10 mg by mouth at bedtime as needed for allergies or rhinitis.    [provider]  Cholecalciferol (VITAMIN D) 50 MCG (2000 UT) tablet Take 1 tablet (2,000 Units total) by mouth daily. 07/01/22   Burnadette Pop, MD  clopidogrel (PLAVIX) 75 MG tablet Take 1 tablet (75 mg total) by mouth daily. 08/22/22   Jodelle Gross, NP  Cyanocobalamin (VITAMIN B-12 PO) Take 1 tablet by mouth daily.    [provider]  ezetimibe (ZETIA) 10 MG tablet Take 1 tablet (10 mg total) by mouth daily. 08/22/22   Jodelle Gross, NP  glimepiride (AMARYL) 4 MG tablet Take 4 mg by mouth daily with breakfast. 10/03/22 04/01/23  [provider]  HUMALOG KWIKPEN 100 UNIT/ML KwikPen Inject 5-22 Units into the skin as needed. Use at lunchtime if levels are still high; use sliding scale after up to a maximum of 22 units per day 06/25/20   [provider]  Insulin Glargine (TOUJEO SOLOSTAR [place]) Inject 56 Units into the skin at bedtime.    [provider]  isosorbide mononitrate (IMDUR) 120 MG 24 hr tablet Take 1 tablet (120 mg total) by mouth every morning. 09/07/22   Abagail Kitchens, PA-C  JARDIANCE 10 MG TABS tablet Take 10 mg by mouth daily. 11/11/22   [provider]  Loratadine (CLARITIN PO) Take 1 tablet by mouth daily as needed (for allergies).  [provider]  Magnesium 250 MG TABS Take 250 mg by mouth daily.    [provider]  metFORMIN (GLUCOPHAGE) 1000 MG tablet Take 1,000 mg by mouth 2 (two) times daily with a meal.    [provider]  metoprolol succinate (TOPROL-XL) 50 MG 24 hr tablet Take 3 tablets (150 mg total) by mouth at bedtime. 08/22/22   Jodelle Gross, NP  nitroGLYCERIN (NITROSTAT) 0.4 MG SL tablet Place 1 tablet (0.4 mg total) under the tongue every 5 (five) minutes x 3 doses as needed for chest pain. 08/22/22   Jodelle Gross, NP  Omega-3 Fatty Acids (FISH OIL) 1000 MG CAPS Take 1,000 mg by mouth daily.     [provider]  ranolazine (RANEXA) 500 MG 12 hr tablet Take 2 tablets (1,000 mg total) by mouth 2 (two) times daily. 12/08/22   Durwin Glaze, MD  ranolazine (RANEXA) 500 MG 12 hr tablet Take one (1) tablet by mouth ( 500 mg) in the am and two (2) tablets by mouth ( 1000 mg ) in the evening. 12/08/22   Durwin Glaze, MD      Allergies    Influenza vaccines, Sitagliptin, Guaifenesin, Hydroxychloroquine, Latex, Nickel, Nitrofurantoin, Other, Phenylephrine-pheniramine-dm, Ultram [tramadol hcl], Wound dressing adhesive, and Escitalopram oxalate    Review of Systems   Review of Systems  Constitutional:  Negative for chills and fever.  HENT:  Negative for ear pain and sore throat.   Eyes:  Negative for pain and visual disturbance.  Respiratory:  Negative for cough and shortness of breath.   Cardiovascular:  Positive for chest pain. Negative for palpitations.  Gastrointestinal:  Negative for abdominal pain and vomiting.  Genitourinary:  Negative for dysuria and hematuria.  Musculoskeletal:  Negative for arthralgias and back pain.  Skin:  Negative for color change and rash.  Neurological:  Negative for seizures and syncope.  All other systems reviewed and are negative.   Physical Exam Updated Vital Signs BP (!) 140/75 (BP Location: Right Arm)   Pulse 70   Temp 98.5 F (36.9 C) (Oral)   Resp 18   Ht 5\' 3"  (1.6 m)   Wt 77.5 kg   SpO2 97%   BMI 30.27 kg/m  Physical Exam Vitals and nursing note reviewed.  Constitutional:      General: She is not in acute distress.    Appearance: She is well-developed.  HENT:     Head: Normocephalic and atraumatic.  Eyes:     Conjunctiva/sclera: Conjunctivae normal.  Cardiovascular:     Rate and Rhythm: Normal rate and regular rhythm.     Heart sounds: No murmur heard. Pulmonary:     Effort: Pulmonary effort is normal. No respiratory distress.      Breath sounds: Normal breath sounds.  Abdominal:     Palpations: Abdomen is soft.     Tenderness: There is no abdominal tenderness.  Musculoskeletal:        General: No swelling.     Cervical back: Neck supple.  Skin:    General: Skin is warm and dry.     Capillary Refill: Capillary refill takes less than 2 seconds.  Neurological:     Mental Status: She is alert.  Psychiatric:        Mood and Affect: Mood normal.     ED Results / Procedures / Treatments   Labs (all labs ordered are listed, but only abnormal results are displayed) Labs Reviewed  BASIC METABOLIC PANEL - Abnormal;  Notable for the following components:      Result Value   Potassium 5.2 (*)    CO2 18 (*)    Glucose, Bld 311 (*)    BUN 27 (*)    All other components within normal limits  CBC  TROPONIN I (HIGH SENSITIVITY)  TROPONIN I (HIGH SENSITIVITY)    EKG EKG Interpretation Date/Time:  Thursday December 08 2022 21:00:33 EST Ventricular Rate:  73 PR Interval:  200 QRS Duration:  104 QT Interval:  424 QTC Calculation: 467 R Axis:   1  Text Interpretation: Normal sinus rhythm Normal ECG When compared with ECG of 08-Dec-2022 05:44, PREVIOUS ECG IS PRESENT Confirmed by Edwin Dada ([id number]) on 12/08/2022 9:53:53 PM  Radiology DG Chest 2 View  Result Date: 12/08/2022 CLINICAL DATA:  Chest pain. EXAM: CHEST - 2 VIEW COMPARISON:  September 06, 2022 FINDINGS: The heart size and mediastinal contours are within normal limits. There is mild calcification of the aortic arch and tortuosity of the descending thoracic aorta. Both lungs are clear. Multilevel degenerative changes are seen throughout the thoracic spine. IMPRESSION: No active cardiopulmonary disease. Electronically Signed   By: Aram Candela M.D.   On: 12/08/2022 01:10    Procedures Procedures    Medications Ordered in ED Medications - No data to display  ED Course/ Medical Decision Making/ A&P                                 Medical  Decision Making Risk Decision regarding hospitalization.   82:61 PM 82 year old female with past medical history of hypertension, diabetes, hyperlipidemia, and coronary artery disease presenting for complaints of chest pain.  Patient is alert and oriented x 3, no acute distress, afebrile, stable vital signs.  Physical exam demonstrates equal bilateral breath sounds with no adventitious lung sounds.  ECG stable.  Troponin stable.  Electrolytes stable.  Chest x-ray stable.  This time I am recommending patient under observation due to known coronary artery disease with LAD vessel occlusion and repeat ED visit in the last 24 hours for chest pain.  Neurology team on and following patient.  Request hospitalist admission. Hospitalist accepting.         Final Clinical Impression(s) / ED Diagnoses Final diagnoses:  Unstable angina [facility])    Rx / DC Orders ED Discharge Orders     None         Franne Forts, DO 12/11/22 0830

## 2022-12-08 NOTE — ED Provider Notes (Signed)
Pt presenting with atypical CP but due to cardiac history cardiology will see pt this morning to determine disposition.  She is currently stable.  Physical Exam  BP (!) 123/52   Pulse (!) 59   Temp 97.9 F (36.6 C)   Resp 15   Ht 5\' 3"  (1.6 m)   Wt 77.5 kg   SpO2 97%   BMI 30.27 kg/m   Physical Exam  Procedures  Procedures  ED Course / MDM    Medical Decision Making Amount and/or Complexity of Data Reviewed Labs: ordered. Radiology: ordered. ECG/medicine tests: ordered.  Risk Prescription drug management. Decision regarding hospitalization.   Cardiology evaluated the patient and recommended going up to 1000 mg twice daily on her Ranexa and to follow-up in clinic.  She is discharged with return precautions.       Durwin Glaze, MD 12/08/22 [phone]

## 2022-12-08 NOTE — Assessment & Plan Note (Addendum)
Appreciate cardiology consult At this point chest pain improving we will continue to monitor troponins remained stable cardiology did not feel that heparin was needed at this time.  Supportive management Patient has known significant CAD which not amendable to stenting Palliative care consult

## 2022-12-08 NOTE — Consult Note (Signed)
Cardiology Consultation   Patient ID: Jill Howard MRN: [id number]; DOB: October 04, 1940  Admit date: 12/07/2022 Date of Consult: 12/08/2022  PCP:  Drenda Freeze   [facility] Providers Cardiologist:  Nanetta Batty, MD        Patient Profile:   Jill Howard is a 82 y.o. female with a hx of severe non-revascularized multivessel CAD (medically managed), HTN, HLD, prior CVA, DMII and fibromyalgia who is being seen 12/08/2022 for the evaluation of angina at the request of Dr. Clayborne Dana.  History of Present Illness:   Jill Howard reports that she was out to dinner with her family when she subsequently walked outside across the street to her car at around 7 pm and developed chest pain. The chest pain was substernal, sharp, nonradiating and moderate to severe in intensity. She took a SLNGT and the pain subsided. Upon arriving home the patient walked up her driveway (which is on an incline) and up several stairs to her home when the chest pain recurred. She took a second SLNGT and the pain subsided. Later on in the evening, she was sitting in her chair at home and the pain returned for a 3rd time even more severe than the previous two episodes and was not fully resolved by a 3rd SLNGT. This prompted her to come to the ED for evaluation. No associated symptoms with each episode of chest pain. She denies diaphoresis, nausea, vomiting, abdominal pain, palpitations, syncope, presyncope, PND, orthopnea or urinary changes. She endorses lower extremity swelling that is chronic and stable.  Of note, the patient has very extensive CAD that is not amendable to revascularization. She underwent LHC on 06/30/22 which revealed major stenoses in all her major epicardial coronary arteries. Since this time, she has been managed medically with DAPT and several antianginals. She was admitted to [facility] cardiology 8/13-8/14 for a similar presentation of angina. During that hospitalization her dose of imdur was  increased. Troponins were negative for ACS. She saw outpatient cardiology on 09/22/22 and her ranolazine dose was increased to 500 mg in the AM and 1000 mg in the pm. She has not been to the ED or seen by cardiology since this time. She reports that at her new baseline, she will develop angina that will resolve with rest. She uses SLNGTs on average 3x monthly. She describes herself as someone who is very active but "needs to scale back."   In the ED her VS were afebrile, HR 75, BP 146/70, RR 16 and satting 98% on RA. Labs notable for troponins 9 -> 17, Na 133, bicarb 16, sCr 0.97. ECG was nonischemic. CXR showed no active disease. She was given SLNGT in the ED without complete resolution of her chest pain so she was started on low dose IV nitroglycerin gtt. She is currently chest pain free and has no complaints.    Past Medical History:  Diagnosis Date   Diabetes mellitus without complication (HCC)    Gallstones    Hypertension    Kidney stone    Kidney stone    Stroke (HCC) 10/24/1996   Vertigo     Past Surgical History:  Procedure Laterality Date   ABDOMINAL HYSTERECTOMY     KIDNEY STONE SURGERY     LEFT HEART CATH AND CORONARY ANGIOGRAPHY N/A 06/30/2022   Procedure: LEFT HEART CATH AND CORONARY ANGIOGRAPHY;  Surgeon: Tonny Bollman, MD;  Location: [facility] INVASIVE CV LAB;  Service: Cardiovascular;  Laterality: N/A;     Home Medications:  Prior to Admission  medications   Medication Sig Start Date End Date Taking? Authorizing Provider  acetaminophen (TYLENOL) 500 MG tablet Take 500-2,000 mg by mouth every 6 (six) hours as needed for moderate pain (Pain).    [provider]  amLODipine (NORVASC) 5 MG tablet Take 1 tablet (5 mg total) by mouth daily. 08/22/22   Jodelle Gross, NP  aspirin EC 81 MG tablet Take 1 tablet (81 mg total) by mouth daily. Swallow whole. 07/02/22   Burnadette Pop, MD  atorvastatin (LIPITOR) 80 MG tablet Take 1 tablet (80 mg total) by mouth at bedtime.  08/22/22   Jodelle Gross, NP  cetirizine (ZYRTEC) 10 MG tablet Take 10 mg by mouth at bedtime as needed for allergies or rhinitis.    [provider]  Cholecalciferol (VITAMIN D) 50 MCG (2000 UT) tablet Take 1 tablet (2,000 Units total) by mouth daily. 07/01/22   Burnadette Pop, MD  clopidogrel (PLAVIX) 75 MG tablet Take 1 tablet (75 mg total) by mouth daily. 08/22/22   Jodelle Gross, NP  Cyanocobalamin (VITAMIN B-12 PO) Take 1 tablet by mouth daily.    [provider]  ezetimibe (ZETIA) 10 MG tablet Take 1 tablet (10 mg total) by mouth daily. 08/22/22   Jodelle Gross, NP  glimepiride (AMARYL) 4 MG tablet Take 4 mg by mouth daily with breakfast. 10/03/22 04/01/23  [provider]  HUMALOG KWIKPEN 100 UNIT/ML KwikPen Inject 0-22 Units into the skin 4 (four) times daily. 06/25/20   [provider]  Insulin Glargine (TOUJEO SOLOSTAR [place]) Inject 56 Units into the skin at bedtime.    [provider]  isosorbide mononitrate (IMDUR) 120 MG 24 hr tablet Take 1 tablet (120 mg total) by mouth every morning. 09/07/22   Abagail Kitchens, PA-C  Loratadine (CLARITIN PO) Take 1 tablet by mouth daily as needed (for allergies).    [provider]  Magnesium 250 MG TABS Take 250 mg by mouth daily.    [provider]  metFORMIN (GLUCOPHAGE) 1000 MG tablet Take 1,000 mg by mouth 2 (two) times daily with a meal.    [provider]  metoprolol succinate (TOPROL-XL) 50 MG 24 hr tablet Take 3 tablets (150 mg total) by mouth at bedtime. 08/22/22   Jodelle Gross, NP  montelukast (SINGULAIR) 10 MG tablet Take 10 mg by mouth at bedtime.    [provider]  nitroGLYCERIN (NITROSTAT) 0.4 MG SL tablet Place 1 tablet (0.4 mg total) under the tongue every 5 (five) minutes x 3 doses as needed for chest pain. 08/22/22   Jodelle Gross, NP  Omega-3 Fatty Acids (FISH OIL) 1000 MG CAPS Take 1,000 mg by mouth daily.     [provider]   pantoprazole (PROTONIX) 40 MG tablet Take 40 mg by mouth daily as needed (Acid reflux).    [provider]  ranolazine (RANEXA) 500 MG 12 hr tablet Take one (1) tablet by mouth ( 500 mg) in the am and two (2) tablets by mouth ( 1000 mg ) in the evening. 10/04/22   Jodelle Gross, NP    Inpatient Medications: Scheduled Meds:  Continuous Infusions:  sodium chloride     PRN Meds:   Allergies:    Allergies  Allergen Reactions   Influenza Vaccines Swelling    Arm was swollen in 2002    Sitagliptin Swelling   Guaifenesin Other (See Comments)    Unknown   Hydroxychloroquine Other (See Comments)    Eye pain  Latex Other (See Comments)    Unknown   Nitrofurantoin Nausea Only   Other Other (See Comments)    Band-Aid   Phenylephrine-Pheniramine-Dm Other (See Comments)    Unknown   Ultram [Tramadol Hcl] Other (See Comments)    hallucinations   Escitalopram Oxalate Other (See Comments)    Hallucinations    Social History:   Social History   Socioeconomic History   Marital status: Married    Spouse name: Not on file   Number of children: Not on file   Years of education: Not on file   Highest education level: Not on file  Occupational History   Not on file  Tobacco Use   Smoking status: Former    Current packs/day: 0.00    Types: Cigarettes    Quit date: 52    Years since quitting: 41.8   Smokeless tobacco: Never  Vaping Use   Vaping status: Never Used  Substance and Sexual Activity   Alcohol use: No   Drug use: No   Sexual activity: Not Currently  Other Topics Concern   Not on file  Social History Narrative   Not on file   Social Determinants of Health   Financial Resource Strain: Not on file  Food Insecurity: Low Risk  (09/27/2022)   Received from [facility]   Hunger Vital Sign    Worried About Running Out of Food in the Last Year: Never true    Ran Out of Food in the Last Year: Never true  Transportation Needs: No Transportation Needs  (09/27/2022)   Received from [employer]    In the past 12 months, has lack of reliable transportation kept you from medical appointments, meetings, work or from getting things needed for daily living? : No  Physical Activity: Not on file  Stress: Not on file  Social Connections: Unknown (06/06/2021)   Received from [facility]   Social Network    Social Network: Not on file  Intimate Partner Violence: Not At Risk (07/17/2022)   Humiliation, Afraid, Rape, and Kick questionnaire    Fear of Current or Ex-Partner: No    Emotionally Abused: No    Physically Abused: No    Sexually Abused: No    Family History:    Family History  Problem Relation Age of Onset   Stroke Other    Heart attack Other    Cancer Other    Diabetes Other    Stroke Mother    Heart attack Mother    Stroke Maternal Aunt    Stroke Paternal Aunt    Rheum arthritis Paternal Aunt    Stroke Brother    Heart attack Brother    COPD Brother    Rheum arthritis Paternal Aunt    Diabetes Son    Heart disease Son    Diabetes Daughter      ROS:  Please see the history of present illness.  All other ROS reviewed and negative.     Physical Exam/Data:   Vitals:   12/08/22 0259 12/08/22 0300 12/08/22 0330 12/08/22 0400  BP: (!) 132/49 (!) 132/49 (!) 130/46 (!) 119/46  Pulse: 65 64 65 63  Resp: 15 14 16 18   Temp:      TempSrc:      SpO2: 97% 97% 95% 95%  Weight:      Height:       No intake or output data in the 24 hours ending 12/08/22 0535    12/08/2022  12:02 AM 10/13/2022    3:37 PM 09/22/2022    2:11 PM  Last 3 Weights  Weight (lbs) 170 lb 13.7 oz 170 lb 12.8 oz 172 lb 6.4 oz  Weight (kg) 77.5 kg 77.474 kg 78.2 kg     Body mass index is 30.27 kg/m.  General:  Well nourished, well developed, in no acute distress HEENT: normal Neck: no JVD Vascular: Distal pulses 2+ bilaterally Cardiac:  normal S1, S2; RRR; no murmur  Lungs:  clear to auscultation bilaterally, no  wheezing, rhonchi or rales  Abd: soft, nontender, no hepatomegaly  Ext: no edema Musculoskeletal:  No deformities, BUE and BLE strength normal and equal Skin: warm and dry  Neuro:  CNs 2-12 intact, no focal abnormalities noted Psych:  Normal affect   EKG:  The EKG was personally reviewed and demonstrates:  sinus bradycardia     Telemetry:  Telemetry was personally reviewed and demonstrates:  NSR  Relevant CV Studies:   LHC 06/30/22:   Dist RCA lesion is 70% stenosed.   RPAV lesion is 90% stenosed.   Dist LM lesion is 50% stenosed.   Prox Cx lesion is 99% stenosed.   Mid Cx to Dist Cx lesion is 99% stenosed with 99% stenosed side branch in 2nd Mrg.   Mid LAD to Dist LAD lesion is 95% stenosed.   1st Diag lesion is 50% stenosed.   Mid LAD lesion is 80% stenosed.   LV end diastolic pressure is normal.   1.  Mild to moderate distal left main stenosis of 50% 2.  Severe mid LAD stenosis with severe diffuse calcification throughout the mid vessel and stenosis up to 90 to 95% 3.  Subtotal occlusion of the left circumflex with severely diseased obtuse marginal branches 4.  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  I think she has poor targets for grafting and would initially try aggressive medical therapy and antianginal treatment.  The mid to distal LAD appear to be 2 mm in diameter or less and I do not think that branch is suitable for atherectomy and/or PCI.  TTE 06/30/22:  IMPRESSIONS     1. Left ventricular ejection fraction, by estimation, is 60 to 65%. The  left ventricle has normal function. The left ventricle has no regional  wall motion abnormalities. Left ventricular diastolic parameters are  consistent with Grade I diastolic  dysfunction (impaired relaxation).   2. Right ventricular systolic function is  normal. The right ventricular  size is normal.   3. Left atrial size was mildly dilated.   4. The mitral valve is abnormal. Trivial mitral valve regurgitation. No  evidence of mitral stenosis.   5. The aortic valve is tricuspid. There is mild calcification of the  aortic valve. There is mild thickening of the aortic valve. Aortic valve  regurgitation is not visualized. Aortic valve sclerosis is present, with  no evidence of aortic valve stenosis.   6. The inferior vena cava is normal in size with greater than 50%  respiratory variability, suggesting right atrial pressure of 3 mmHg.   Laboratory Data:  High Sensitivity Troponin:   Recent Labs  Lab 12/08/22 0049 12/08/22 0252  TROPONINIHS 9 17     Chemistry Recent Labs  Lab 12/08/22 0049  NA 133*  K 4.1  CL 103  CO2 16*  GLUCOSE 181*  BUN 40*  CREATININE 0.97  CALCIUM 9.2  GFRNONAA 59*  ANIONGAP 14    Recent Labs  Lab 12/08/22 0049  PROT 6.5  ALBUMIN 3.1*  AST 22  ALT 17  ALKPHOS 68  BILITOT 0.4   Lipids No results for input(s): "CHOL", "TRIG", "HDL", "LABVLDL", "LDLCALC", "CHOLHDL" in the last 168 hours.  Hematology Recent Labs  Lab 12/08/22 0049  WBC 8.1  RBC 4.17  HGB 12.4  HCT 39.2  MCV 94.0  MCH 29.7  MCHC 31.6  RDW 13.6  PLT 209   Thyroid No results for input(s): "TSH", "FREET4" in the last 168 hours.  BNPNo results for input(s): "BNP", "PROBNP" in the last 168 hours.  DDimer No results for input(s): "DDIMER" in the last 168 hours.   Radiology/Studies:  DG Chest 2 View  Result Date: 12/08/2022 CLINICAL DATA:  Chest pain. EXAM: CHEST - 2 VIEW COMPARISON:  September 06, 2022 FINDINGS: The heart size and mediastinal contours are within normal limits. There is mild calcification of the aortic arch and tortuosity of the descending thoracic aorta. Both lungs are clear. Multilevel degenerative changes are seen throughout the thoracic spine. IMPRESSION: No active cardiopulmonary disease. Electronically  Signed   By: Aram Candela M.D.   On: 12/08/2022 01:10     Assessment and Plan:   Jill Howard is a 82 y.o. female with a hx of severe non-revascularized multivessel CAD (medically managed), HTN, HLD, prior CVA, DMII and fibromyalgia who is being seen 12/08/2022 for the evaluation of angina at the request of Dr. Clayborne Dana.  #Acute on Chronic Angina #Severe Multivessel CAD without Targets for Revascularization ::Pt presents with recurrent angina at rest in the setting of known severe CAD. It sounds like the triggers for her angina today were exertion and cold weather. Ultimately, she has few options for angina control longterm given her unfavorable coronary anatomy. Her best option is medical management + lifestyle modifications to reduce her angina burden. She is currently chest pain free albeit on a very low dose of IV nitroglycerin. I recommend that we discontinue this and try SLNGT again. Further, I recommend increasing her ranolazine to 1000 mg BID to achieve maximum dosage. She still has room to go up on her metoprolol as well; however, I think one change should be made at a time. Lastly, I discussed non-medicinal options with the patient and her son including cardiac rehab and/or palliative care. They seemed most amenable to cardiac rehab which may be a good option for her. -check 3rd troponin -stop nitroglycerin drip and transition to SLNGT prn -increase ranolazine to 1000 mg BID -continue home metoprolol, Imdur and amlodipine -would favor outpatient referral to cardiac rehab upon discharge from the ED  #NAGMA ::Incidentally noted bicarb of 16 on labs with borderline gap of 14. Unclear if this is spurious or not, but the patient is on an SGLT2 inhibitor so euglycemic DKA must be considered. Will recheck a BMP and a beta hydroxybutyrate. -repeat BMP -check BHB     Risk Assessment/Risk Scores:                For questions or updates, please contact [facility] Please  consult [email or web address] for contact info under    Signed, Karl Ito, MD  12/08/2022 5:35 AM

## 2022-12-08 NOTE — Assessment & Plan Note (Signed)
Continue Lipitor 80 mg a day continue aspirin Plavix

## 2022-12-09 DIAGNOSIS — I25110 Atherosclerotic heart disease of native coronary artery with unstable angina pectoris: Secondary | ICD-10-CM | POA: Diagnosis not present

## 2022-12-09 DIAGNOSIS — I200 Unstable angina: Secondary | ICD-10-CM | POA: Diagnosis not present

## 2022-12-09 DIAGNOSIS — Z515 Encounter for palliative care: Secondary | ICD-10-CM

## 2022-12-09 DIAGNOSIS — Z7189 Other specified counseling: Secondary | ICD-10-CM

## 2022-12-09 LAB — COMPREHENSIVE METABOLIC PANEL
ALT: 18 U/L (ref 0–44)
AST: 14 U/L — ABNORMAL LOW (ref 15–41)
Albumin: 3 g/dL — ABNORMAL LOW (ref 3.5–5.0)
Alkaline Phosphatase: 56 U/L (ref 38–126)
Anion gap: 8 (ref 5–15)
BUN: 26 mg/dL — ABNORMAL HIGH (ref 8–23)
CO2: 21 mmol/L — ABNORMAL LOW (ref 22–32)
Calcium: 9.5 mg/dL (ref 8.9–10.3)
Chloride: 109 mmol/L (ref 98–111)
Creatinine, Ser: 0.72 mg/dL (ref 0.44–1.00)
GFR, Estimated: >60 mL/min (ref >60)
Glucose, Bld: 206 mg/dL — ABNORMAL HIGH (ref 70–99)
Potassium: 4.3 mmol/L (ref 3.5–5.1)
Sodium: 138 mmol/L (ref 135–145)
Total Bilirubin: 0.5 mg/dL (ref <1.2)
Total Protein: 6.1 g/dL — ABNORMAL LOW (ref 6.5–8.1)

## 2022-12-09 LAB — CBC
HCT: 36.1 % (ref 36.0–46.0)
Hemoglobin: 11.4 g/dL — ABNORMAL LOW (ref 12.0–15.0)
MCH: 29 pg (ref 26.0–34.0)
MCHC: 31.6 g/dL (ref 30.0–36.0)
MCV: 91.9 fL (ref 80.0–100.0)
Platelets: 183 10*3/uL (ref 150–400)
RBC: 3.93 MIL/uL (ref 3.87–5.11)
RDW: 13.7 % (ref 11.5–15.5)
WBC: 6 10*3/uL (ref 4.0–10.5)
nRBC: 0 % (ref 0.0–0.2)

## 2022-12-09 LAB — GLUCOSE, CAPILLARY
Glucose-Capillary: 130 mg/dL — ABNORMAL HIGH (ref 70–99)
Glucose-Capillary: 102 mg/dL — ABNORMAL HIGH (ref 70–99)
Glucose-Capillary: 183 mg/dL — ABNORMAL HIGH (ref 70–99)
Glucose-Capillary: 256 mg/dL — ABNORMAL HIGH (ref 70–99)
Glucose-Capillary: 272 mg/dL — ABNORMAL HIGH (ref 70–99)

## 2022-12-09 LAB — HEMOGLOBIN A1C
Hgb A1c MFr Bld: 7.6 % — ABNORMAL HIGH (ref 4.8–5.6)
Mean Plasma Glucose: 171.42 mg/dL

## 2022-12-09 LAB — MAGNESIUM: Magnesium: 2 mg/dL (ref 1.7–2.4)

## 2022-12-09 LAB — LACTIC ACID, PLASMA: Lactic Acid, Venous: 1.2 mmol/L (ref 0.5–1.9)

## 2022-12-09 LAB — PHOSPHORUS: Phosphorus: 3.4 mg/dL (ref 2.5–4.6)

## 2022-12-09 MED ORDER — EZETIMIBE 10 MG PO TABS
10.0000 mg | ORAL_TABLET | Freq: Every day | ORAL | Status: DC
  Administered 2022-12-09 – 2022-12-10 (×2): 10 mg via ORAL
  Filled 2022-12-09 (×2): qty 1

## 2022-12-09 MED ORDER — METOPROLOL SUCCINATE ER 50 MG PO TB24: 150.0000 mg | ORAL_TABLET | Freq: Every day | ORAL | Status: DC

## 2022-12-09 MED ORDER — SODIUM CHLORIDE 0.9 % IV SOLN
250.0000 mL | INTRAVENOUS | Status: AC | PRN
Start: 2022-12-09 — End: 2022-12-10

## 2022-12-09 MED ORDER — ACETAMINOPHEN 325 MG PO TABS: 650.0000 mg | ORAL_TABLET | Freq: Four times a day (QID) | ORAL | Status: DC | PRN

## 2022-12-09 MED ORDER — SODIUM CHLORIDE 0.9% FLUSH: 3.0000 mL | INTRAVENOUS | Status: DC | PRN

## 2022-12-09 MED ORDER — MIRTAZAPINE 15 MG PO TABS
15.0000 mg | ORAL_TABLET | Freq: Every day | ORAL | Status: DC
  Administered 2022-12-09: 15 mg via ORAL
  Filled 2022-12-09 (×2): qty 1

## 2022-12-09 MED ORDER — ISOSORBIDE MONONITRATE ER 60 MG PO TB24
120.0000 mg | ORAL_TABLET | Freq: Every morning | ORAL | Status: DC
  Administered 2022-12-09 – 2022-12-10 (×2): 120 mg via ORAL
  Filled 2022-12-09 (×2): qty 2

## 2022-12-09 MED ORDER — METOPROLOL SUCCINATE ER 100 MG PO TB24
100.0000 mg | ORAL_TABLET | Freq: Every day | ORAL | Status: DC
  Administered 2022-12-09: 100 mg via ORAL
  Filled 2022-12-09: qty 1

## 2022-12-09 MED ORDER — HYDROCODONE-ACETAMINOPHEN 5-325 MG PO TABS: 1.0000 | ORAL_TABLET | ORAL | Status: DC | PRN

## 2022-12-09 MED ORDER — ENOXAPARIN SODIUM 40 MG/0.4ML IJ SOSY
40.0000 mg | PREFILLED_SYRINGE | INTRAMUSCULAR | Status: DC
Start: 2022-12-09 — End: 2022-12-10
  Administered 2022-12-09 – 2022-12-10 (×2): 40 mg via SUBCUTANEOUS
  Filled 2022-12-09 (×2): qty 0.4

## 2022-12-09 MED ORDER — RANOLAZINE ER 500 MG PO TB12
1000.0000 mg | ORAL_TABLET | Freq: Two times a day (BID) | ORAL | Status: DC
Start: 2022-12-09 — End: 2022-12-10
  Administered 2022-12-09 – 2022-12-10 (×3): 1000 mg via ORAL
  Filled 2022-12-09 (×3): qty 2

## 2022-12-09 MED ORDER — ACETAMINOPHEN 650 MG RE SUPP: 650.0000 mg | Freq: Four times a day (QID) | RECTAL | Status: DC | PRN

## 2022-12-09 MED ORDER — ONDANSETRON HCL 4 MG/2ML IJ SOLN: 4.0000 mg | Freq: Four times a day (QID) | INTRAMUSCULAR | Status: DC | PRN

## 2022-12-09 MED ORDER — MORPHINE SULFATE 10 MG/5ML PO SOLN
2.5000 mg | ORAL | Status: DC | PRN
  Filled 2022-12-09: qty 2

## 2022-12-09 MED ORDER — SODIUM CHLORIDE 0.9% FLUSH
3.0000 mL | Freq: Two times a day (BID) | INTRAVENOUS | Status: DC
  Administered 2022-12-09 – 2022-12-10 (×4): 3 mL via INTRAVENOUS

## 2022-12-09 MED ORDER — MORPHINE SULFATE (PF) 2 MG/ML IV SOLN
2.0000 mg | INTRAVENOUS | Status: DC | PRN
  Administered 2022-12-09: 2 mg via INTRAVENOUS
  Filled 2022-12-09: qty 1

## 2022-12-09 MED ORDER — ATORVASTATIN CALCIUM 80 MG PO TABS
80.0000 mg | ORAL_TABLET | Freq: Every day | ORAL | Status: DC
  Administered 2022-12-09 (×2): 80 mg via ORAL
  Filled 2022-12-09: qty 1
  Filled 2022-12-09: qty 2

## 2022-12-09 MED ORDER — INSULIN GLARGINE-YFGN 100 UNIT/ML ~~LOC~~ SOLN
40.0000 [IU] | Freq: Every day | SUBCUTANEOUS | Status: DC
  Administered 2022-12-09 (×2): 40 [IU] via SUBCUTANEOUS
  Filled 2022-12-09 (×4): qty 0.4

## 2022-12-09 MED ORDER — ASPIRIN 81 MG PO TBEC
81.0000 mg | DELAYED_RELEASE_TABLET | Freq: Every day | ORAL | Status: DC
  Administered 2022-12-09 – 2022-12-10 (×2): 81 mg via ORAL
  Filled 2022-12-09 (×2): qty 1

## 2022-12-09 MED ORDER — ONDANSETRON HCL 4 MG PO TABS: 4.0000 mg | ORAL_TABLET | Freq: Four times a day (QID) | ORAL | Status: DC | PRN

## 2022-12-09 MED ORDER — INSULIN ASPART 100 UNIT/ML IJ SOLN
0.0000 [IU] | Freq: Three times a day (TID) | INTRAMUSCULAR | Status: DC
Start: 2022-12-09 — End: 2022-12-10
  Administered 2022-12-09: 5 [IU] via SUBCUTANEOUS
  Administered 2022-12-10: 2 [IU] via SUBCUTANEOUS
  Administered 2022-12-10: 3 [IU] via SUBCUTANEOUS

## 2022-12-09 MED ORDER — CLOPIDOGREL BISULFATE 75 MG PO TABS
75.0000 mg | ORAL_TABLET | Freq: Every day | ORAL | Status: DC
  Administered 2022-12-09 – 2022-12-10 (×2): 75 mg via ORAL
  Filled 2022-12-09 (×2): qty 1

## 2022-12-09 MED ORDER — MORPHINE SULFATE (CONCENTRATE) 10 MG /0.5 ML PO SOLN: 5.0000 mg | ORAL | Status: DC | PRN

## 2022-12-09 MED ORDER — INSULIN GLARGINE (1 UNIT DIAL) 300 UNIT/ML ~~LOC~~ SOPN: 40.0000 [IU] | PEN_INJECTOR | Freq: Every day | SUBCUTANEOUS | Status: DC

## 2022-12-09 NOTE — ED Notes (Signed)
Bladder scan volume 

## 2022-12-09 NOTE — Consult Note (Signed)
Consultation Note Date: 12/09/2022   Patient Name: Jill Howard  DOB: 1940/11/16  MRN: [id number]  Age / Sex: 82 y.o., female  PCP: Drenda Freeze Referring Physician: Debarah Crape, DO  Reason for Consultation: Establishing goals of care  HPI/Patient Profile: 82 y.o. female  with past medical history of severe non-revascularized multivessel CAD, HTN, HLD, prior CVA, diabetes, fibromyalgia admitted on 12/08/2022 with recurrent chest pain.   Clinical Assessment and Goals of Care: Consult received and chart review completed. I met today with Ms. Ezzard Standing along with daughter Claris Gower and daughter-in-law Lieutenant Diego. We had a good conversation about chest pain, cardiac medications, and hypotension. We discussed that her cardiac medications are being titrated but there is not much more wiggle room for adjustment. Claris Gower has good questions about palliative care and pain medications to assist with chest pain. We discussed use of low dose morphine as a back up to provide relief in the absence of relief from cardiac medications. We discussed side effects of morphine with lethargy/sedation and that it can also cause hypotension but hopefully not too significant with use of small doses. All are on board for any help to manage symptoms and keep her out of the hospital. They agree with ongoing assistance from outpatient palliative care. We did not directly discuss hospice support today but Ms. Diercks shares about a friend of hers who died recently and how she tried to get her to accept hospice support. She would be open to hospice in the future for herself.   We spent much time discussing quality of life and prioritizing what is important. Ms. Faga has some long standing issues from her childhood and engrained in her from her mother that anyone who is just sitting around is lazy. We discussed the alternative of the harms  of pushing her body past its limit. She recounts events of decline at home before hospitalization and how she thought she was dying. She was still trying to walk around the house and change clothes after spilling a drink with chest pain and it became increasingly worse. I prompted her that anytime she has chest pain and utilizes nitroglycerin or morphine she needs to stop and rest for minimum of 30 minutes and ideally 1 hour. I encouraged her to listen to the signs her body is giving her. I encouraged her to continue to work to rethink her day to day life to best utilize her time and energy for what is most important to her.   Family at bedside express concern for OCD habits and anxiety. Ms. Rivenburgh shares that she never considered these issues as anxiety driven but recognizes how this can be a form of anxiety during our discussion. She is open to medication to assist. She had reaction to escitalopram previously. She has had prescription for Valium but says she never took any. I discussed with pharmacist and we will begin mirtazapine.   All questions/concerns addressed. Emotional support provided.   Primary Decision Maker PATIENT    SUMMARY OF RECOMMENDATIONS   - DNR  in place - Trial PRN morphine for recurrent chest pain - Begin mirtazapine 15 mg at bedtime - Outpatient palliative with [facility]  Code Status/Advance Care Planning: DNR   Symptom Management:  Chest pain: PRN morphine.  Anxiety: Mirtazapine 15 mg at bedtime.   Prognosis:  Overall prognosis poor.   Discharge Planning: Home with Palliative Services      Primary Diagnoses: Present on Admission:  Unstable angina (HCC)  Essential hypertension  Hyperlipidemia   I have reviewed the medical record, interviewed the patient and family, and examined the patient. The following aspects are pertinent.  Past Medical History:  Diagnosis Date   Diabetes mellitus without complication (HCC)    Gallstones    History of  stroke 10/25/2016   Hypertension    Kidney stone    Kidney stone    Stroke [facility]) 10/24/1996   Vertigo    Social History   Socioeconomic History   Marital status: Married    Spouse name: Not on file   Number of children: Not on file   Years of education: Not on file   Highest education level: Not on file  Occupational History   Not on file  Tobacco Use   Smoking status: Former    Current packs/day: 0.00    Types: Cigarettes    Quit date: 24    Years since quitting: 41.9   Smokeless tobacco: Never  Vaping Use   Vaping status: Never Used  Substance and Sexual Activity   Alcohol use: No   Drug use: No   Sexual activity: Not Currently  Other Topics Concern   Not on file  Social History Narrative   Not on file   Social Determinants of Health   Financial Resource Strain: Not on file  Food Insecurity: No Food Insecurity (12/09/2022)   Hunger Vital Sign    Worried About Running Out of Food in the Last Year: Never true    Ran Out of Food in the Last Year: Never true  Transportation Needs: No Transportation Needs (12/09/2022)   PRAPARE - Administrator, Civil Service (Medical): No    Lack of Transportation (Non-Medical): No  Physical Activity: Not on file  Stress: Not on file  Social Connections: Unknown (06/06/2021)   Received from [facility]   Social Network    Social Network: Not on file   Family History  Problem Relation Age of Onset   Stroke Other    Heart attack Other    Cancer Other    Diabetes Other    Stroke Mother    Heart attack Mother    Stroke Maternal Aunt    Stroke Paternal Aunt    Rheum arthritis Paternal Aunt    Stroke Brother    Heart attack Brother    COPD Brother    Rheum arthritis Paternal Aunt    Diabetes Son    Heart disease Son    Diabetes Daughter    Scheduled Meds:  aspirin EC  81 mg Oral Daily   atorvastatin  80 mg Oral QHS   clopidogrel  75 mg Oral Daily   enoxaparin (LOVENOX) injection  40 mg  Subcutaneous Q24H   ezetimibe  10 mg Oral Daily   insulin aspart  0-9 Units Subcutaneous Q4H   insulin glargine-yfgn  40 Units Subcutaneous QHS   isosorbide mononitrate  120 mg Oral q morning   metoprolol succinate  150 mg Oral QHS   ranolazine  1,000 mg Oral BID  sodium chloride flush  3 mL Intravenous Q12H   Continuous Infusions:  sodium chloride     PRN Meds:.sodium chloride, acetaminophen **OR** acetaminophen, HYDROcodone-acetaminophen, morphine injection, ondansetron **OR** ondansetron (ZOFRAN) IV, sodium chloride flush Allergies  Allergen Reactions   Influenza Vaccines Swelling    Arm was swollen in 2002    Sitagliptin Swelling   Guaifenesin Other (See Comments)    Unknown   Hydroxychloroquine Other (See Comments)    Eye pain   Latex Other (See Comments)    Unknown   Nickel Other (See Comments)    Causes swelling of the arms   Nitrofurantoin Nausea Only   Other Other (See Comments)    Band-Aid   Phenylephrine-Pheniramine-Dm Other (See Comments)    Unknown; causes issues with blood pressure   Ultram [Tramadol Hcl] Other (See Comments)    hallucinations   Wound Dressing Adhesive Itching   Escitalopram Oxalate Other (See Comments)    Hallucinations   Review of Systems  Constitutional:  Positive for activity change.  Cardiovascular:  Positive for chest pain.    Physical Exam Vitals and nursing note reviewed.  Constitutional:      General: She is not in acute distress.    Appearance: Normal appearance.  Cardiovascular:     Rate and Rhythm: Bradycardia present.  Pulmonary:     Effort: No tachypnea, accessory muscle usage or respiratory distress.  Abdominal:     General: Abdomen is flat.  Neurological:     Mental Status: She is alert and oriented to person, place, and time.     Vital Signs: BP (!) 110/51 (BP Location: Right Arm)   Pulse (!) 58   Temp 98.1 F (36.7 C) (Oral)   Resp 19   Ht 5\' 3"  (1.6 m)   Wt 79.2 kg   SpO2 100%   BMI 30.93 kg/m  Pain  Scale: 0-10   Pain Score: 0-No pain   SpO2: SpO2: 100 % O2 Device:SpO2: 100 % O2 Flow Rate: .   IO: Intake/output summary: No intake or output data in the 24 hours ending 12/09/22 1020  LBM: Last BM Date : 12/07/22 Baseline Weight: Weight: 77.5 kg Most recent weight: Weight: 79.2 kg     Palliative Assessment/Data:    Time Total: 90 min  Greater than 50%  of this time was spent counseling and coordinating care related to the above assessment and plan.  Signed by: Yong Channel, NP Palliative Medicine Team Pager # [phone] (M-F 8a-5p) Team Phone # [phone] (Nights/Weekends)

## 2022-12-09 NOTE — Progress Notes (Signed)
Mobility Specialist Progress Note:   12/09/22 1200  Mobility  Activity Ambulated with assistance in hallway  Level of Assistance Contact guard assist, steadying assist (HHA)  Assistive Device Other (Comment) (HHA)  Distance Ambulated (ft) 80 ft  Activity Response Tolerated well  Mobility Referral Yes  $Mobility charge 1 Mobility  Mobility Specialist Start Time (ACUTE ONLY) 1157  Mobility Specialist Stop Time (ACUTE ONLY) 1204  Mobility Specialist Time Calculation (min) (ACUTE ONLY) 7 min    Pre Mobility: 58 HR During Mobility: 82 HR Post Mobility:  79 HR  Pt received in bed, agreeable to mobility. Had some dizziness d/t vertigo, otherwise asymptomatic w/ no complaints. Returned to room w/o fault. Pt left on EOB with call bell and all needs met. Family present.  D'Vante Earlene Plater Mobility Specialist Please contact via Special educational needs teacher or Rehab office at [phone]

## 2022-12-09 NOTE — Progress Notes (Signed)
PROGRESS NOTE    Jill Howard  WJX:[id number] DOB: December 13, 1940 DOA: 12/08/2022 PCP: Shon Baton, PA-C  Chief Complaint  Patient presents with   Chest Pain    Hospital Course:  Jill Howard is 82 y.o. female with severe nonrevascularizable three-vessel CAD, hypertension, hyperlipidemia, prior CVA, diabetes, fibromyalgia.  She presents on this admission complaining of chest pain.  Patient initially presented to the ER 2 days prior complaining of chest pain after taking multiple doses of sublingual nitro with chest pain recurring.  On initial evaluation in the ER she was evaluated by cardiology who determined no intervention  and increased her Ranexa.  Patient went home but chest pain recurred so she returned to the ER.  She is now being admitted for pain control.  Subjective: This morning patient is sitting calmly on edge of bed discussing with family.  She reports no chest pain at rest.  Reports chest pain comes with exertion.   Objective: Vitals:   12/09/22 0043 12/09/22 0113 12/09/22 0455 12/09/22 0747  BP:  (!) 158/59 (!) 126/48 (!) 110/51  Pulse:  66 63 (!) 58  Resp:  19 18 19   Temp: 98.3 F (36.8 C) 98 F (36.7 C) 98 F (36.7 C) 98.1 F (36.7 C)  TempSrc: Oral Oral Oral Oral  SpO2:   97% 100%  Weight:  79.2 kg    Height:       No intake or output data in the 24 hours ending 12/09/22 1713 Filed Weights   12/08/22 2220 12/09/22 0113  Weight: 77.5 kg 79.2 kg    Examination: General exam: Appears calm and comfortable, NAD Respiratory system: No work of breathing, symmetric chest wall expansion Cardiovascular system: S1 & S2 heard, RRR.  Gastrointestinal system: Abdomen is nondistended, soft and nontender.  Neuro: Alert and oriented. No focal neurological deficits. Extremities: Symmetric, expected ROM Skin: No rashes, lesions Psychiatry: Demonstrates appropriate judgement and insight. Mood & affect appropriate for situation.   Assessment & Plan:  Principal  Problem:   Unstable angina (HCC) Active Problems:   Essential hypertension   Hyperlipidemia   History of stroke   DM2 (diabetes mellitus, type 2) (HCC)    Unstable angina Cardiology consulted Chest pain is improving though still recurs with exertion. Patient has known significant CAD that is not amendable to stenting. For now cardiology has increased Ranexa to 1000 BID.   Palliative care has also been consulted who is assisting with pain management  Diabetes Sliding scale insulin TSH within normal limits Hemoglobin A1c 7.6%  Hypertension Continue current meds avoid hypotension.  Patient is nearing bradycardic.  Will decrease metoprolol.  Hyperlipidemia Continue Lipitor  Prior CVA Resume home meds, aspirin, Plavix, statin  BMI 30 Outpatient follow-up for lifestyle modification  Fibromyalgia Palliative care consulted and assisting with pain control  DVT prophylaxis: lovenox  Code Status: DNR Family Communication: family at bedside. Care plan discussed Disposition:  Status is: Observation The patient remains OBS appropriate and will d/c before 2 midnights.    Consultants:  Cardiology    Procedures:  none  Antimicrobials:  Anti-infectives (From admission, onward)    None       Data Reviewed: I have personally reviewed following labs and imaging studies CBC: Recent Labs  Lab 12/08/22 0049 12/08/22 2120 12/09/22 0059  WBC 8.1 5.6 6.0  NEUTROABS 4.2  --   --   HGB 12.4 12.1 11.4*  HCT 39.2 38.0 36.1  MCV 94.0 92.0 91.9  PLT 209 196 183   Basic Metabolic Panel:  Recent Labs  Lab 12/08/22 0049 12/08/22 0540 12/08/22 2120 12/09/22 0059  NA 133* 133* 136 138  K 4.1 4.4 5.2* 4.3  CL 103 103 107 109  CO2 16* 20* 18* 21*  GLUCOSE 181* 194* 311* 206*  BUN 40* 39* 27* 26*  CREATININE 0.97 0.94 0.87 0.72  CALCIUM 9.2 9.0 9.6 9.5  MG  --   --   --  2.0  PHOS  --   --   --  3.4   GFR: Estimated Creatinine Clearance: 55.9 mL/min (by C-G formula  based on SCr of 0.72 mg/dL). Liver Function Tests: Recent Labs  Lab 12/08/22 0049 12/09/22 0059  AST 22 14*  ALT 17 18  ALKPHOS 68 56  BILITOT 0.4 0.5  PROT 6.5 6.1*  ALBUMIN 3.1* 3.0*   CBG: Recent Labs  Lab 12/08/22 2345 12/09/22 0452 12/09/22 0745 12/09/22 1206  GLUCAP 214* 130* 102* 183*    No results found for this or any previous visit (from the past 240 hour(s)).   Radiology Studies: DG Chest 2 View  Result Date: 12/08/2022 CLINICAL DATA:  Chest pain EXAM: CHEST - 2 VIEW COMPARISON:  12/08/2022 FINDINGS: The heart size and mediastinal contours are within normal limits. Both lungs are clear. The visualized skeletal structures are unremarkable. Aortic atherosclerosis. IMPRESSION: No active cardiopulmonary disease. Electronically Signed   By: Jasmine Pang M.D.   On: 12/08/2022 23:19   DG Chest 2 View  Result Date: 12/08/2022 CLINICAL DATA:  Chest pain. EXAM: CHEST - 2 VIEW COMPARISON:  September 06, 2022 FINDINGS: The heart size and mediastinal contours are within normal limits. There is mild calcification of the aortic arch and tortuosity of the descending thoracic aorta. Both lungs are clear. Multilevel degenerative changes are seen throughout the thoracic spine. IMPRESSION: No active cardiopulmonary disease. Electronically Signed   By: Aram Candela M.D.   On: 12/08/2022 01:10    Scheduled Meds:  aspirin EC  81 mg Oral Daily   atorvastatin  80 mg Oral QHS   clopidogrel  75 mg Oral Daily   enoxaparin (LOVENOX) injection  40 mg Subcutaneous Q24H   ezetimibe  10 mg Oral Daily   insulin aspart  0-9 Units Subcutaneous TID AC & HS   insulin glargine-yfgn  40 Units Subcutaneous QHS   isosorbide mononitrate  120 mg Oral q morning   metoprolol succinate  150 mg Oral QHS   mirtazapine  15 mg Oral QHS   ranolazine  1,000 mg Oral BID   sodium chloride flush  3 mL Intravenous Q12H   Continuous Infusions:  sodium chloride       LOS: 0 days    Time spent:    Debarah Crape, DO [facility]  To contact the attending physician between 7A-7P please use Epic Chat. To contact the covering physician during after hours 7P-7A, please review Amion.   12/09/2022, 5:13 PM

## 2022-12-10 DIAGNOSIS — I200 Unstable angina: Secondary | ICD-10-CM | POA: Diagnosis not present

## 2022-12-10 DIAGNOSIS — I25110 Atherosclerotic heart disease of native coronary artery with unstable angina pectoris: Secondary | ICD-10-CM | POA: Diagnosis not present

## 2022-12-10 LAB — GLUCOSE, CAPILLARY
Glucose-Capillary: 170 mg/dL — ABNORMAL HIGH (ref 70–99)
Glucose-Capillary: 203 mg/dL — ABNORMAL HIGH (ref 70–99)

## 2022-12-10 MED ORDER — RANOLAZINE ER 500 MG PO TB12: 1000.0000 mg | ORAL_TABLET | Freq: Two times a day (BID) | ORAL | 0 refills | Status: AC

## 2022-12-10 MED ORDER — METOPROLOL SUCCINATE ER 100 MG PO TB24: 100.0000 mg | ORAL_TABLET | Freq: Every day | ORAL | 0 refills | Status: DC

## 2022-12-10 NOTE — Care Management Obs Status (Signed)
MEDICARE OBSERVATION STATUS NOTIFICATION   Patient Details  Name: Jill Howard MRN: [id number] Date of Birth: 1940-08-15   Medicare Observation Status Notification Given:  Yes    Ronny Bacon, RN 12/10/2022, 6:59 AM

## 2022-12-10 NOTE — Discharge Summary (Signed)
Physician Discharge Summary   Patient: Jill Howard MRN: [id number] DOB: 1940/04/26  Admit date:     12/08/2022  Discharge date: 12/10/22  Discharge Physician: Debarah Crape   PCP: Shon Baton, PA-C   Recommendations at discharge:    Follow up with PCP, you may need outpatient referral to palliative care resources.  Discharge Diagnoses: Principal Problem:   Unstable angina (HCC) Active Problems:   Essential hypertension   Hyperlipidemia   History of stroke   DM2 (diabetes mellitus, type 2) (HCC)  Resolved Problems:   * No resolved hospital problems. [facility] Course: [name] is an 82 year old female with severe nonrevascularizable three-vessel CAD, hypertension, hyperlipidemia, prior CVA, diabetes, fibromyalgia.  She initially presented to the ED complaining of chest pain.  She tried multiple doses of sublingual nitro with recurring chest pain outpatient and thus presented to the ER.  Initially she was evaluated by cardiology who determined no acute intervention and patient went home.  Chest pain recurred so she returned to the ER again.  She was admitted for pain control.  Unfortunately given her significant CAD with other comorbidities, the patient is a poor candidate for grafting or PCI.  Cardiology recommended medical therapy with increased antianginal treatment.  We increased her dose of Ranexa.  We also consulted palliative care.  At this time patient is not interested in hospice but was open to palliative care as a resource.  She was monitored on telemetry and chest pain gradually resolved.  She required as needed morphine only once during her stay.  She did not need prescription at discharge.  She will continue to follow with palliative care as part of her care team outpatient.  Additionally, her blood pressure medications were titrated slightly as she was bradycardic and at times hypotensive on her current regimen.  She was sent home with a lower dose of metoprolol  and higher dose of Ranexa.  She will follow closely with cardiology, PCP, and palliative care outpatient.  Her care plan was discussed extensively with the patient as well as with her son, daughter, and daughter-in-law who are all at bedside during times of evaluation.   Consultants: Cardiology, Palliative care Procedures performed: none  Disposition: Home Diet recommendation:  Discharge Diet Orders (From admission, onward)     Start     Ordered   12/10/22 0000  Diet - low sodium heart healthy        12/10/22 1257           Regular diet DISCHARGE MEDICATION: Allergies as of 12/10/2022       Reactions   Influenza Vaccines Swelling   Arm was swollen in 2002    Sitagliptin Swelling   Guaifenesin Other (See Comments)   Unknown   Hydroxychloroquine Other (See Comments)   Eye pain   Latex Other (See Comments)   Unknown   Nickel Other (See Comments)   Causes swelling of the arms   Nitrofurantoin Nausea Only   Other Other (See Comments)   Band-Aid   Phenylephrine-pheniramine-dm Other (See Comments)   Unknown; causes issues with blood pressure   Ultram [tramadol Hcl] Other (See Comments)   hallucinations   Wound Dressing Adhesive Itching   Escitalopram Oxalate Other (See Comments)   Hallucinations        Medication List     STOP taking these medications    amLODipine 5 MG tablet Commonly known as: NORVASC   CLARITIN PO       TAKE these medications  acetaminophen 500 MG tablet Commonly known as: TYLENOL Take 1,000 mg by mouth every 6 (six) hours as needed for moderate pain (pain score 4-6) (Pain).   aspirin EC 81 MG tablet Take 1 tablet (81 mg total) by mouth daily. Swallow whole.   atorvastatin 80 MG tablet Commonly known as: LIPITOR Take 1 tablet (80 mg total) by mouth at bedtime.   cetirizine 10 MG tablet Commonly known as: ZYRTEC Take 10 mg by mouth at bedtime as needed for allergies or rhinitis.   clopidogrel 75 MG tablet Commonly known as:  PLAVIX Take 1 tablet (75 mg total) by mouth daily.   ezetimibe 10 MG tablet Commonly known as: ZETIA Take 1 tablet (10 mg total) by mouth daily.   Fish Oil 1000 MG Caps Take 1,000 mg by mouth daily.   glimepiride 4 MG tablet Commonly known as: AMARYL Take 4 mg by mouth daily with breakfast.   HumaLOG KwikPen 100 UNIT/ML KwikPen Generic drug: insulin lispro Inject 5-22 Units into the skin as needed. Use at lunchtime if levels are still high; use sliding scale after up to a maximum of 22 units per day   isosorbide mononitrate 120 MG 24 hr tablet Commonly known as: IMDUR Take 1 tablet (120 mg total) by mouth every morning.   Jardiance 10 MG Tabs tablet Generic drug: empagliflozin Take 10 mg by mouth daily.   Magnesium 250 MG Tabs Take 250 mg by mouth daily.   metFORMIN 1000 MG tablet Commonly known as: GLUCOPHAGE Take 1,000 mg by mouth 2 (two) times daily with a meal.   metoprolol succinate 100 MG 24 hr tablet Commonly known as: TOPROL-XL Take 1 tablet (100 mg total) by mouth at bedtime. Take with or immediately following a meal. What changed:  medication strength how much to take additional instructions   nitroGLYCERIN 0.4 MG SL tablet Commonly known as: NITROSTAT Place 1 tablet (0.4 mg total) under the tongue every 5 (five) minutes x 3 doses as needed for chest pain.   ranolazine 500 MG 12 hr tablet Commonly known as: Ranexa Take 2 tablets (1,000 mg total) by mouth 2 (two) times daily. What changed: Another medication with the same name was removed. Continue taking this medication, and follow the directions you see here.   TOUJEO SOLOSTAR Sebastian Inject 56 Units into the skin at bedtime.   VITAMIN B-12 PO Take 1 tablet by mouth daily.   Vitamin D 50 MCG (2000 UT) tablet Take 1 tablet (2,000 Units total) by mouth daily.        Discharge Exam: Filed Weights   12/08/22 2220 12/09/22 0113  Weight: 77.5 kg 79.2 kg   Constitutional:  Normal appearance. Non  toxic-appearing.  HENT: Head Normocephalic and atraumatic.  Mucous membranes are moist.  Eyes:  Extraocular intact. Conjunctivae normal. Pupils are equal, round, and reactive to light.  Cardiovascular: Rate and Rhythm: Normal rate and regular rhythm.  Pulmonary: Non labored, symmetric rise of chest wall.  Musculoskeletal:  Normal range of motion.  Skin: warm and dry. not jaundiced.  Neurological: No focal deficit present. alert. Oriented. Psychiatric: Mood and Affect congruent.    Condition at discharge: stable  The results of significant diagnostics from this hospitalization (including imaging, microbiology, ancillary and laboratory) are listed below for reference.   Imaging Studies: DG Chest 2 View  Result Date: 12/08/2022 CLINICAL DATA:  Chest pain EXAM: CHEST - 2 VIEW COMPARISON:  12/08/2022 FINDINGS: The heart size and mediastinal contours are within normal limits. Both lungs are  clear. The visualized skeletal structures are unremarkable. Aortic atherosclerosis. IMPRESSION: No active cardiopulmonary disease. Electronically Signed   By: Jasmine Pang M.D.   On: 12/08/2022 23:19   DG Chest 2 View  Result Date: 12/08/2022 CLINICAL DATA:  Chest pain. EXAM: CHEST - 2 VIEW COMPARISON:  September 06, 2022 FINDINGS: The heart size and mediastinal contours are within normal limits. There is mild calcification of the aortic arch and tortuosity of the descending thoracic aorta. Both lungs are clear. Multilevel degenerative changes are seen throughout the thoracic spine. IMPRESSION: No active cardiopulmonary disease. Electronically Signed   By: Aram Candela M.D.   On: 12/08/2022 01:10    Microbiology: Results for orders placed or performed during the hospital encounter of 05/25/20  Resp Panel by RT-PCR (Flu A&B, Covid) Nasopharyngeal Swab     Status: None   Collection Time: 05/25/20  3:18 PM   Specimen: Nasopharyngeal Swab; Nasopharyngeal(NP) swabs in vial transport medium  Result Value Ref  Range Status   SARS Coronavirus 2 by RT PCR NEGATIVE NEGATIVE Final    Comment: (NOTE) SARS-CoV-2 target nucleic acids are NOT DETECTED.  The SARS-CoV-2 RNA is generally detectable in upper respiratory specimens during the acute phase of infection. The lowest concentration of SARS-CoV-2 viral copies this assay can detect is 138 copies/mL. A negative result does not preclude SARS-Cov-2 infection and should not be used as the sole basis for treatment or other patient management decisions. A negative result may occur with  improper specimen collection/handling, submission of specimen other than nasopharyngeal swab, presence of viral mutation(s) within the areas targeted by this assay, and inadequate number of viral copies(<138 copies/mL). A negative result must be combined with clinical observations, patient history, and epidemiological information. The expected result is Negative.  Fact Sheet for Patients:  BloggerCourse.com  Fact Sheet for Healthcare Providers:  SeriousBroker.it  This test is no t yet approved or cleared by the [place] FDA and  has been authorized for detection and/or diagnosis of SARS-CoV-2 by FDA under an Emergency Use Authorization (EUA). This EUA will remain  in effect (meaning this test can be used) for the duration of the COVID-19 declaration under Section 564(b)(1) of the Act, 21 U.S.C.section 360bbb-3(b)(1), unless the authorization is terminated  or revoked sooner.       Influenza A by PCR NEGATIVE NEGATIVE Final   Influenza B by PCR NEGATIVE NEGATIVE Final    Comment: (NOTE) The Xpert Xpress SARS-CoV-2/FLU/RSV plus assay is intended as an aid in the diagnosis of influenza from Nasopharyngeal swab specimens and should not be used as a sole basis for treatment. Nasal washings and aspirates are unacceptable for Xpert Xpress SARS-CoV-2/FLU/RSV testing.  Fact Sheet for  Patients: BloggerCourse.com  Fact Sheet for Healthcare Providers: SeriousBroker.it  This test is not yet approved or cleared by the [place] FDA and has been authorized for detection and/or diagnosis of SARS-CoV-2 by FDA under an Emergency Use Authorization (EUA). This EUA will remain in effect (meaning this test can be used) for the duration of the COVID-19 declaration under Section 564(b)(1) of the Act, 21 U.S.C. section 360bbb-3(b)(1), unless the authorization is terminated or revoked.  Performed at [facility], [address], [place], [place] [address]     Labs: CBC: Recent Labs  Lab 12/08/22 0049 12/08/22 2120 12/09/22 0059  WBC 8.1 5.6 6.0  NEUTROABS 4.2  --   --   HGB 12.4 12.1 11.4*  HCT 39.2 38.0 36.1  MCV 94.0 92.0 91.9  PLT  209 196 183   Basic Metabolic Panel: Recent Labs  Lab 12/08/22 0049 12/08/22 0540 12/08/22 2120 12/09/22 0059  NA 133* 133* 136 138  K 4.1 4.4 5.2* 4.3  CL 103 103 107 109  CO2 16* 20* 18* 21*  GLUCOSE 181* 194* 311* 206*  BUN 40* 39* 27* 26*  CREATININE 0.97 0.94 0.87 0.72  CALCIUM 9.2 9.0 9.6 9.5  MG  --   --   --  2.0  PHOS  --   --   --  3.4   Liver Function Tests: Recent Labs  Lab 12/08/22 0049 12/09/22 0059  AST 22 14*  ALT 17 18  ALKPHOS 68 56  BILITOT 0.4 0.5  PROT 6.5 6.1*  ALBUMIN 3.1* 3.0*   CBG: Recent Labs  Lab 12/09/22 1206 12/09/22 1726 12/09/22 2108 12/10/22 0819 12/10/22 1130  GLUCAP 183* 256* 272* 170* 203*    Discharge time spent: greater than 30 minutes.  Signed: Debarah Crape, DO [facility] 12/10/2022

## 2022-12-10 NOTE — Hospital Course (Signed)
Jill Howard is an 82 year old female with severe nonrevascularizable three-vessel CAD, hypertension, hyperlipidemia, prior CVA, diabetes, fibromyalgia.  She initially presented to the ED complaining of chest pain.  She tried multiple doses of sublingual nitro with recurring chest pain outpatient and thus presented to the ER.  Initially she was evaluated by cardiology who determined no acute intervention and patient went home.  Chest pain recurred so she returned to the ER again.  She was admitted for pain control.  Unfortunately given her significant CAD with other comorbidities, the patient is a poor candidate for grafting or PCI.  Cardiology recommended medical therapy with increased antianginal treatment.  We increased her dose of Ranexa.  We also consulted palliative care.  At this time patient is not interested in hospice but was open to palliative care as a resource.  She was monitored on telemetry and chest pain gradually resolved.  She required as needed morphine only once during her stay.  She did not need prescription at discharge.  She will continue to follow with palliative care as part of her care team outpatient.  Additionally, her blood pressure medications were titrated slightly as she was bradycardic and at times hypotensive on her current regimen.  She was sent home with a lower dose of metoprolol and higher dose of Ranexa.  She will follow closely with cardiology, PCP, and palliative care outpatient.  Her care plan was discussed extensively with the patient as well as with her son, daughter, and daughter-in-law who are all at bedside during times of evaluation.

## 2022-12-26 NOTE — Progress Notes (Unsigned)
Cardiology Office Note:  .   Date:  12/29/2022  ID:  Jill Howard, DOB 10-09-1940, MRN [id number] PCP: Drenda Freeze  [facility] Providers Cardiologist:  Nanetta Batty, MD  }   History of Present Illness: .   Jill Howard is a 82 y.o. female with severe nonrevascularizable three-vessel CAD, hypertension, hyperlipidemia, prior CVA, diabetes, fibromyalgia.  She has multiple hospitalizations for recurrent chest pain.  She is recently been hospitalized in November 2024 with complaints of chest pain.  She is a poor candidate for grafting or PCI, she is being treated medically.  She is here again after being hospitalized between 12/08/2022 and 12/10/2018 for for chest pain, she was admitted for pain control.  Ranexa was increased to 1000 mg twice daily,, Palliative care was consulted.  She was not interested in Hospice.   She is excepting palliative care resources.  She used a wheelchair to come up here in the office today as it causes her to have angina when she is walking.  She has angina walking from her den to her bedroom and has to stop and take nitroglycerin.  She does have a rollator that she uses at home but may need to have a wheelchair at some point.  She does get short of breath and may need oxygen at some point.  She is having weekly visits with palliative care nurses.  ROS: As above otherwise negative  Studies Reviewed: Marland Kitchen   [facility] 06/30/2022  Dist RCA lesion is 70% stenosed.   RPAV lesion is 90% stenosed.   Dist LM lesion is 50% stenosed.   Prox Cx lesion is 99% stenosed.   Mid Cx to Dist Cx lesion is 99% stenosed with 99% stenosed side branch in 2nd Mrg.   Mid LAD to Dist LAD lesion is 95% stenosed.   1st Diag lesion is 50% stenosed.   Mid LAD lesion is 80% stenosed.   LV end diastolic pressure is normal.   1.  Mild to moderate distal left main stenosis of 50% 2.  Severe mid LAD stenosis with severe diffuse calcification throughout the mid vessel and stenosis up  to 90 to 95% 3.  Subtotal occlusion of the left circumflex with severely diseased obtuse marginal branches 4.  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  I think she has poor targets for grafting and would initially try aggressive medical therapy and antianginal treatment.  The mid to distal LAD appear to be 2 mm in diameter or less and I do not think that branch is suitable for atherectomy and/or PCI.   Physical Exam:   VS:  BP (!) 140/60   Pulse 80   Ht 5\' 3"  (1.6 m)   Wt 172 lb (78 kg)   SpO2 96%   BMI 30.47 kg/m    Wt Readings from Last 3 Encounters:  12/29/22 172 lb (78 kg)  12/09/22 174 lb 9.6 oz (79.2 kg)  12/08/22 170 lb 13.7 oz (77.5 kg)    GEN: Well nourished, well developed in no acute distress NECK: No JVD; No carotid bruits CARDIAC: RRR, no murmurs, rubs, gallops RESPIRATORY:  Clear to auscultation without rales, wheezing or rhonchi  ABDOMEN: Soft, non-tender, non-distended EXTREMITIES:  No edema; No deformity   ASSESSMENT AND PLAN: .   Severe coronary artery disease: Inoperable no interventional sites.  She continues on medical management  only and has angina with very little exertion.  Continues on DAPT.  Now on higher doses of Ranexa 1000 mg twice daily and uses nitroglycerin as needed.         Now working with palliative care for assistance in improving quality of life.  She is going to except her health to include oxygen, wheelchairs, and assistive devices if necessary for other ADLs.  She is doing her best not to overdo as she has normally done.  She enjoys decorating her church and being very involved there over Christmas but has not been able to do that this year which causes her some anxiety.  She is now on a antianxiety medication provided by her primary care provider.  2.  Hyperlipidemia: Continues on  statin medication atorvastatin 80 mg and Zetia 10 mg daily.  Daily.  Goal of LDL less than 70.  3.  Type 2 diabetes: Followed by PCP         Signed, Bettey Mare. Jill Howard, ANP, AACC

## 2022-12-29 ENCOUNTER — Encounter: Payer: Self-pay | Admitting: Adult Health

## 2022-12-29 ENCOUNTER — Ambulatory Visit: Payer: Medicare Other | Attending: Adult Health | Admitting: Adult Health

## 2022-12-29 VITALS — BP 140/60 | HR 80 | Ht 63.0 in | Wt 172.0 lb

## 2022-12-29 DIAGNOSIS — I2510 Atherosclerotic heart disease of native coronary artery without angina pectoris: Secondary | ICD-10-CM | POA: Insufficient documentation

## 2022-12-29 DIAGNOSIS — E7800 Pure hypercholesterolemia, unspecified: Secondary | ICD-10-CM | POA: Insufficient documentation

## 2022-12-29 DIAGNOSIS — I10 Essential (primary) hypertension: Secondary | ICD-10-CM | POA: Diagnosis not present

## 2022-12-29 NOTE — Patient Instructions (Signed)
Medication Instructions:  No Changes *If you need a refill on your cardiac medications before your next appointment, please call your pharmacy*   Lab Work: No Labs If you have labs (blood work) drawn today and your tests are completely normal, you will receive your results only by: MyChart Message (if you have MyChart) OR A paper copy in the mail If you have any lab test that is abnormal or we need to change your treatment, we will call you to review the results.   Testing/Procedures: No Testing   Follow-Up: At [facility], you and your health needs are our priority.  As part of our continuing mission to provide you with exceptional heart care, we have created designated Provider Care Teams.  These Care Teams include your primary Cardiologist (physician) and Advanced Practice Providers (APPs -  Physician Assistants and Nurse Practitioners) who all work together to provide you with the care you need, when you need it.  We recommend signing up for the patient portal called "MyChart".  Sign up information is provided on this After Visit Summary.  MyChart is used to connect with patients for Virtual Visits (Telemedicine).  Patients are able to view lab/test results, encounter notes, upcoming appointments, etc.  Non-urgent messages can be sent to your provider as well.   To learn more about what you can do with MyChart, go to [email or web address].    Your next appointment:   3 month(s)  Provider:   Joni Reining, DNP, ANP    or , Nanetta Batty, MD

## 2023-01-24 ENCOUNTER — Other Ambulatory Visit (HOSPITAL_COMMUNITY): Payer: Self-pay

## 2023-01-24 ENCOUNTER — Telehealth: Payer: Self-pay | Admitting: Cardiovascular Disease

## 2023-01-24 MED ORDER — METOPROLOL SUCCINATE ER 100 MG PO TB24: 100.0000 mg | ORAL_TABLET | Freq: Every day | ORAL | 11 refills | Status: AC

## 2023-01-24 MED ORDER — METOPROLOL SUCCINATE ER 100 MG PO TB24
100.0000 mg | ORAL_TABLET | Freq: Every day | ORAL | 11 refills | Status: DC
  Filled 2023-01-24: qty 30, 30d supply, fill #0

## 2023-01-24 NOTE — Addendum Note (Signed)
Addended by: Adriana Simas, Aaran Enberg L on: 01/24/2023 10:27 AM   Modules accepted: Orders

## 2023-01-24 NOTE — Telephone Encounter (Signed)
*  STAT* If patient is at the pharmacy, call can be transferred to refill team.   1. Which medications need to be refilled? (please list name of each medication and dose if known)   metoprolol  succinate (TOPROL -XL) 100 MG 24 hr tablet   2. Would you like to learn more about the convenience, safety, & potential cost savings by using the [facility]?   3. Are you open to using the [facility] (Type [facility]. ).  4. Which pharmacy/location (including street and city if local pharmacy) is medication to be sent to?  [facility] - [place], [place] - [address]   5. Do they need a 30 day or 90 day supply?   30 day  Patient stated she has 1 tablet left of this medication.

## 2023-02-03 ENCOUNTER — Encounter (HOSPITAL_COMMUNITY): Payer: Self-pay

## 2023-02-03 ENCOUNTER — Other Ambulatory Visit: Payer: Self-pay

## 2023-02-03 ENCOUNTER — Emergency Department (HOSPITAL_COMMUNITY): Payer: Medicare Other

## 2023-02-03 ENCOUNTER — Inpatient Hospital Stay (HOSPITAL_COMMUNITY)
Admission: EM | Admit: 2023-02-03 | Discharge: 2023-02-05 | DRG: 303 | Disposition: A | Discharge status: Home / Self Care | Payer: Medicare Other | Attending: Family Medicine | Admitting: Family Medicine

## 2023-02-03 DIAGNOSIS — Z7902 Long term (current) use of antithrombotics/antiplatelets: Secondary | ICD-10-CM

## 2023-02-03 DIAGNOSIS — E11649 Type 2 diabetes mellitus with hypoglycemia without coma: Secondary | ICD-10-CM | POA: Diagnosis not present

## 2023-02-03 DIAGNOSIS — Z8249 Family history of ischemic heart disease and other diseases of the circulatory system: Secondary | ICD-10-CM

## 2023-02-03 DIAGNOSIS — Z7984 Long term (current) use of oral hypoglycemic drugs: Secondary | ICD-10-CM

## 2023-02-03 DIAGNOSIS — I25110 Atherosclerotic heart disease of native coronary artery with unstable angina pectoris: Secondary | ICD-10-CM | POA: Diagnosis not present

## 2023-02-03 DIAGNOSIS — Z91048 Other nonmedicinal substance allergy status: Secondary | ICD-10-CM

## 2023-02-03 DIAGNOSIS — Z794 Long term (current) use of insulin: Secondary | ICD-10-CM

## 2023-02-03 DIAGNOSIS — F419 Anxiety disorder, unspecified: Secondary | ICD-10-CM | POA: Diagnosis present

## 2023-02-03 DIAGNOSIS — E669 Obesity, unspecified: Secondary | ICD-10-CM | POA: Diagnosis present

## 2023-02-03 DIAGNOSIS — Z87442 Personal history of urinary calculi: Secondary | ICD-10-CM

## 2023-02-03 DIAGNOSIS — Z90710 Acquired absence of both cervix and uterus: Secondary | ICD-10-CM

## 2023-02-03 DIAGNOSIS — Z515 Encounter for palliative care: Secondary | ICD-10-CM

## 2023-02-03 DIAGNOSIS — Z881 Allergy status to other antibiotic agents status: Secondary | ICD-10-CM

## 2023-02-03 DIAGNOSIS — E785 Hyperlipidemia, unspecified: Secondary | ICD-10-CM | POA: Diagnosis present

## 2023-02-03 DIAGNOSIS — Z66 Do not resuscitate: Secondary | ICD-10-CM | POA: Diagnosis present

## 2023-02-03 DIAGNOSIS — Z91040 Latex allergy status: Secondary | ICD-10-CM

## 2023-02-03 DIAGNOSIS — Z823 Family history of stroke: Secondary | ICD-10-CM

## 2023-02-03 DIAGNOSIS — I679 Cerebrovascular disease, unspecified: Secondary | ICD-10-CM | POA: Diagnosis present

## 2023-02-03 DIAGNOSIS — F39 Unspecified mood [affective] disorder: Secondary | ICD-10-CM | POA: Diagnosis present

## 2023-02-03 DIAGNOSIS — E118 Type 2 diabetes mellitus with unspecified complications: Secondary | ICD-10-CM | POA: Diagnosis present

## 2023-02-03 DIAGNOSIS — I200 Unstable angina: Principal | ICD-10-CM

## 2023-02-03 DIAGNOSIS — Z6830 Body mass index (BMI) 30.0-30.9, adult: Secondary | ICD-10-CM

## 2023-02-03 DIAGNOSIS — I10 Essential (primary) hypertension: Secondary | ICD-10-CM | POA: Diagnosis present

## 2023-02-03 DIAGNOSIS — Z7982 Long term (current) use of aspirin: Secondary | ICD-10-CM

## 2023-02-03 DIAGNOSIS — M797 Fibromyalgia: Secondary | ICD-10-CM | POA: Diagnosis present

## 2023-02-03 DIAGNOSIS — Z8616 Personal history of COVID-19: Secondary | ICD-10-CM

## 2023-02-03 DIAGNOSIS — Z87891 Personal history of nicotine dependence: Secondary | ICD-10-CM

## 2023-02-03 DIAGNOSIS — Z833 Family history of diabetes mellitus: Secondary | ICD-10-CM

## 2023-02-03 DIAGNOSIS — Z888 Allergy status to other drugs, medicaments and biological substances status: Secondary | ICD-10-CM

## 2023-02-03 DIAGNOSIS — Z887 Allergy status to serum and vaccine status: Secondary | ICD-10-CM

## 2023-02-03 DIAGNOSIS — Z79899 Other long term (current) drug therapy: Secondary | ICD-10-CM

## 2023-02-03 DIAGNOSIS — Z8673 Personal history of transient ischemic attack (TIA), and cerebral infarction without residual deficits: Secondary | ICD-10-CM

## 2023-02-03 LAB — COMPREHENSIVE METABOLIC PANEL
ALT: 14 U/L (ref 0–44)
AST: 20 U/L (ref 15–41)
Albumin: 2.8 g/dL — ABNORMAL LOW (ref 3.5–5.0)
Alkaline Phosphatase: 55 U/L (ref 38–126)
Anion gap: 13 (ref 5–15)
BUN: 19 mg/dL (ref 8–23)
CO2: 22 mmol/L (ref 22–32)
Calcium: 9.6 mg/dL (ref 8.9–10.3)
Chloride: 100 mmol/L (ref 98–111)
Creatinine, Ser: 1.01 mg/dL — ABNORMAL HIGH (ref 0.44–1.00)
GFR, Estimated: 56 mL/min — ABNORMAL LOW (ref >60)
Glucose, Bld: 309 mg/dL — ABNORMAL HIGH (ref 70–99)
Potassium: 4.7 mmol/L (ref 3.5–5.1)
Sodium: 135 mmol/L (ref 135–145)
Total Bilirubin: 1 mg/dL (ref 0.0–1.2)
Total Protein: 5.9 g/dL — ABNORMAL LOW (ref 6.5–8.1)

## 2023-02-03 LAB — CBC
HCT: 36.1 % (ref 36.0–46.0)
Hemoglobin: 11.8 g/dL — ABNORMAL LOW (ref 12.0–15.0)
MCH: 30.5 pg (ref 26.0–34.0)
MCHC: 32.7 g/dL (ref 30.0–36.0)
MCV: 93.3 fL (ref 80.0–100.0)
Platelets: 189 10*3/uL (ref 150–400)
RBC: 3.87 MIL/uL (ref 3.87–5.11)
RDW: 13.2 % (ref 11.5–15.5)
WBC: 6 10*3/uL (ref 4.0–10.5)
nRBC: 0 % (ref 0.0–0.2)

## 2023-02-03 LAB — BRAIN NATRIURETIC PEPTIDE: B Natriuretic Peptide: 204.2 pg/mL — ABNORMAL HIGH (ref 0.0–100.0)

## 2023-02-03 LAB — LIPASE, BLOOD: Lipase: 29 U/L (ref 11–51)

## 2023-02-03 LAB — CBG MONITORING, ED: Glucose-Capillary: 290 mg/dL — ABNORMAL HIGH (ref 70–99)

## 2023-02-03 LAB — GLUCOSE, CAPILLARY: Glucose-Capillary: 199 mg/dL — ABNORMAL HIGH (ref 70–99)

## 2023-02-03 LAB — TROPONIN I (HIGH SENSITIVITY)
Troponin I (High Sensitivity): 8 ng/L (ref <18)
Troponin I (High Sensitivity): 11 ng/L (ref <18)

## 2023-02-03 MED ORDER — CLOPIDOGREL BISULFATE 75 MG PO TABS
75.0000 mg | ORAL_TABLET | Freq: Every day | ORAL | Status: DC
  Administered 2023-02-03 – 2023-02-05 (×3): 75 mg via ORAL
  Filled 2023-02-03 (×3): qty 1

## 2023-02-03 MED ORDER — ATORVASTATIN CALCIUM 80 MG PO TABS
80.0000 mg | ORAL_TABLET | Freq: Every day | ORAL | Status: DC
  Administered 2023-02-03 – 2023-02-04 (×2): 80 mg via ORAL
  Filled 2023-02-03 (×2): qty 1

## 2023-02-03 MED ORDER — INSULIN GLARGINE-YFGN 100 UNIT/ML ~~LOC~~ SOLN
56.0000 [IU] | Freq: Every day | SUBCUTANEOUS | Status: DC
  Administered 2023-02-03 – 2023-02-04 (×2): 56 [IU] via SUBCUTANEOUS
  Filled 2023-02-03 (×2): qty 0.56

## 2023-02-03 MED ORDER — ONDANSETRON HCL 4 MG/2ML IJ SOLN: 4.0000 mg | Freq: Four times a day (QID) | INTRAMUSCULAR | Status: DC | PRN

## 2023-02-03 MED ORDER — ACETAMINOPHEN 650 MG RE SUPP: 650.0000 mg | Freq: Four times a day (QID) | RECTAL | Status: DC | PRN

## 2023-02-03 MED ORDER — INSULIN ASPART 100 UNIT/ML IJ SOLN: 0.0000 [IU] | Freq: Every day | INTRAMUSCULAR | Status: DC

## 2023-02-03 MED ORDER — METFORMIN HCL 500 MG PO TABS
1000.0000 mg | ORAL_TABLET | Freq: Two times a day (BID) | ORAL | Status: DC
  Administered 2023-02-03 – 2023-02-05 (×4): 1000 mg via ORAL
  Filled 2023-02-03 (×4): qty 2

## 2023-02-03 MED ORDER — ACETAMINOPHEN 325 MG PO TABS: 650.0000 mg | ORAL_TABLET | Freq: Four times a day (QID) | ORAL | Status: DC | PRN

## 2023-02-03 MED ORDER — BUSPIRONE HCL 5 MG PO TABS
5.0000 mg | ORAL_TABLET | Freq: Two times a day (BID) | ORAL | Status: DC
  Administered 2023-02-03 – 2023-02-05 (×4): 5 mg via ORAL
  Filled 2023-02-03 (×4): qty 1

## 2023-02-03 MED ORDER — RANOLAZINE ER 500 MG PO TB12
1000.0000 mg | ORAL_TABLET | Freq: Two times a day (BID) | ORAL | Status: DC
Start: 2023-02-03 — End: 2023-02-05
  Administered 2023-02-03 – 2023-02-05 (×4): 1000 mg via ORAL
  Filled 2023-02-03 (×4): qty 2

## 2023-02-03 MED ORDER — METOPROLOL SUCCINATE ER 100 MG PO TB24
100.0000 mg | ORAL_TABLET | Freq: Every day | ORAL | Status: DC
  Filled 2023-02-03: qty 1

## 2023-02-03 MED ORDER — MORPHINE SULFATE (PF) 2 MG/ML IV SOLN
1.0000 mg | INTRAVENOUS | Status: DC | PRN
  Administered 2023-02-03: 1 mg via INTRAVENOUS
  Filled 2023-02-03: qty 1

## 2023-02-03 MED ORDER — EMPAGLIFLOZIN 10 MG PO TABS
10.0000 mg | ORAL_TABLET | Freq: Every day | ORAL | Status: DC
  Administered 2023-02-03 – 2023-02-05 (×3): 10 mg via ORAL
  Filled 2023-02-03 (×3): qty 1

## 2023-02-03 MED ORDER — ASPIRIN 81 MG PO TBEC
81.0000 mg | DELAYED_RELEASE_TABLET | Freq: Every day | ORAL | Status: DC
  Administered 2023-02-04 – 2023-02-05 (×2): 81 mg via ORAL
  Filled 2023-02-03 (×2): qty 1

## 2023-02-03 MED ORDER — NITROGLYCERIN IN D5W 200-5 MCG/ML-% IV SOLN
0.0000 ug/min | INTRAVENOUS | Status: DC
  Administered 2023-02-03: 5 ug/min via INTRAVENOUS
  Administered 2023-02-03: 10 ug/min via INTRAVENOUS
  Filled 2023-02-03: qty 250

## 2023-02-03 MED ORDER — GLIMEPIRIDE 4 MG PO TABS
4.0000 mg | ORAL_TABLET | Freq: Every day | ORAL | Status: DC
  Administered 2023-02-04: 4 mg via ORAL
  Filled 2023-02-03 (×2): qty 1

## 2023-02-03 MED ORDER — INSULIN ASPART 100 UNIT/ML IJ SOLN
0.0000 [IU] | Freq: Three times a day (TID) | INTRAMUSCULAR | Status: DC
  Administered 2023-02-04: 2 [IU] via SUBCUTANEOUS
  Administered 2023-02-04: 3 [IU] via SUBCUTANEOUS

## 2023-02-03 MED ORDER — MAGNESIUM OXIDE -MG SUPPLEMENT 400 (240 MG) MG PO TABS
200.0000 mg | ORAL_TABLET | Freq: Every day | ORAL | Status: DC
  Administered 2023-02-03 – 2023-02-05 (×3): 200 mg via ORAL
  Filled 2023-02-03 (×3): qty 1

## 2023-02-03 MED ORDER — SENNOSIDES-DOCUSATE SODIUM 8.6-50 MG PO TABS: 1.0000 | ORAL_TABLET | Freq: Every evening | ORAL | Status: DC | PRN

## 2023-02-03 MED ORDER — VITAMIN D 25 MCG (1000 UNIT) PO TABS
2000.0000 [IU] | ORAL_TABLET | Freq: Every day | ORAL | Status: DC
  Administered 2023-02-03 – 2023-02-05 (×3): 2000 [IU] via ORAL
  Filled 2023-02-03 (×3): qty 2

## 2023-02-03 MED ORDER — ENOXAPARIN SODIUM 40 MG/0.4ML IJ SOSY
40.0000 mg | PREFILLED_SYRINGE | INTRAMUSCULAR | Status: DC
  Administered 2023-02-03 – 2023-02-04 (×2): 40 mg via SUBCUTANEOUS
  Filled 2023-02-03 (×2): qty 0.4

## 2023-02-03 MED ORDER — ONDANSETRON HCL 4 MG PO TABS
4.0000 mg | ORAL_TABLET | Freq: Four times a day (QID) | ORAL | Status: DC | PRN
  Administered 2023-02-04: 4 mg via ORAL
  Filled 2023-02-03: qty 1

## 2023-02-03 MED ORDER — VITAMIN B-12 100 MCG PO TABS
100.0000 ug | ORAL_TABLET | Freq: Every day | ORAL | Status: DC
  Administered 2023-02-03 – 2023-02-05 (×3): 100 ug via ORAL
  Filled 2023-02-03 (×3): qty 1

## 2023-02-03 MED ORDER — ISOSORBIDE MONONITRATE ER 60 MG PO TB24
120.0000 mg | ORAL_TABLET | Freq: Every morning | ORAL | Status: DC
  Administered 2023-02-04 – 2023-02-05 (×2): 120 mg via ORAL
  Filled 2023-02-03 (×2): qty 2

## 2023-02-03 MED ORDER — METOPROLOL SUCCINATE ER 50 MG PO TB24
50.0000 mg | ORAL_TABLET | Freq: Every day | ORAL | Status: DC
  Administered 2023-02-03 – 2023-02-04 (×2): 50 mg via ORAL
  Filled 2023-02-03 (×2): qty 1

## 2023-02-03 MED ORDER — EZETIMIBE 10 MG PO TABS
10.0000 mg | ORAL_TABLET | Freq: Every day | ORAL | Status: DC
  Administered 2023-02-03 – 2023-02-05 (×3): 10 mg via ORAL
  Filled 2023-02-03 (×3): qty 1

## 2023-02-03 NOTE — Progress Notes (Addendum)
 2015: Pt complaining of chest pain and pain on her left arm. Pt describes it  it feels like there is an elephant on my chest and the pain on left hand is new one  2022: EKG was done and it shows NSR.  2025: Charge nurse is notified. Nitro gtt is titrated based on MAR order. BP is elevated. See VS for details.  2048: Pt is still complaining of CP. Nitro gtt is titrated based on MAR order.  2051: Pt is still complaining of CP. Nitro gtt is titrated based on MAR order.  2058: Pt is still complaining of CP. Nitro gtt is titrated based on MAR order.  2105: Pt is still complaining of CP. Nitro gtt is titrated based on MAR order.  2110: on-call Dr (Dr Shona) is notified, Dr is on her way.   2130: Dr Shona at bedside.  2143: New orders are placed (see MAR for details). Per Dr Shona, Give the patient's BP medicine and there is no need to titrated Nitro gtt any more over night. Cardiologist consult was ordered. Keep monitor the patient.   2200: PRN medicine is giver given per order.  2250: Pt is no longer having CP or pain in her left arm. Pt is lying in bed and feeling comfort watching TV.

## 2023-02-03 NOTE — ED Notes (Signed)
Pt ambulatory to restroom w/assistance

## 2023-02-03 NOTE — ED Notes (Signed)
 Patient left the floor in stable condition, AOX4, with her family, belongings and staff.

## 2023-02-03 NOTE — ED Provider Notes (Signed)
 [place] [facility] Provider Note   CSN: [id number] Arrival date & time: 02/03/23  1137     History  Chief Complaint  Patient presents with   Chest Pain    Jill Howard is a 83 y.o. female.  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  Family notes that she had some chest pain overnight took several doses of nitro.  Pain persisted this morning and was given more nitro by EMS.  This seemingly helped her pain, but still continues.  No associated shortness of breath.  However she did have some nausea and vomiting.   Chest Pain      Home Medications Prior to Admission medications   Medication Sig Start Date End Date Taking? Authorizing Provider  acetaminophen  (TYLENOL ) 500 MG tablet Take 1,000 mg by mouth every 6 (six) hours as needed for moderate pain (pain score 4-6) (Pain).   Yes [provider]  aspirin  EC 81 MG tablet Take 1 tablet (81 mg total) by mouth daily. Swallow whole. 07/02/22  Yes Jillian Buttery, MD  atorvastatin  (LIPITOR ) 80 MG tablet Take 1 tablet (80 mg total) by mouth at bedtime. 08/22/22  Yes Jerilynn Lamarr [name], NP  busPIRone  (BUSPAR ) 5 MG tablet Take 5 mg by mouth 2 (two) times daily. 12/16/22 06/14/23 Yes [provider]  Cholecalciferol  (VITAMIN D ) 50 MCG (2000 UT) tablet Take 1 tablet (2,000 Units total) by mouth daily. 07/01/22  Yes Jillian Buttery, MD  clopidogrel  (PLAVIX ) 75 MG tablet Take 1 tablet (75 mg total) by mouth daily. 08/22/22  Yes Jerilynn Lamarr [name], NP  Cyanocobalamin  (VITAMIN B-12 PO) Take 1 tablet by mouth daily.   Yes [provider]  ezetimibe  (ZETIA ) 10 MG tablet Take 1 tablet (10 mg total) by mouth daily. 08/22/22  Yes Jerilynn Lamarr [name], NP  glimepiride  (AMARYL ) 4 MG tablet Take 4 mg by mouth daily with breakfast. 10/03/22 04/01/23 Yes [provider]  HUMALOG KWIKPEN  100 UNIT/ML KwikPen Inject 5-22 Units into the skin as needed. Use at lunchtime if levels are still high; use sliding scale after up to a maximum of 22 units per day 06/25/20  Yes [provider]  Insulin  Glargine (TOUJEO  SOLOSTAR [place]) Inject 56 Units into the skin at bedtime.   Yes [provider]  isosorbide  mononitrate (IMDUR ) 120 MG 24 hr tablet Take 1 tablet (120 mg total) by mouth every morning. 09/07/22  Yes Darryle Thom CROME, PA-C  JARDIANCE  10 MG TABS tablet Take 10 mg by mouth daily. 11/11/22  Yes [provider]  loratadine  (CLARITIN ) 10 MG tablet Take 10 mg by mouth daily as needed for allergies. 12/28/22  Yes [provider]  Magnesium  250 MG TABS Take 250 mg by mouth daily.   Yes [provider]  metFORMIN  (GLUCOPHAGE ) 1000 MG tablet Take 1,000 mg by mouth 2 (two) times daily with a meal.   Yes [provider]  metoprolol  succinate (TOPROL -XL) 100 MG 24 hr tablet Take 1 tablet (100 mg total) by mouth at bedtime. Take with or immediately following a meal. 01/24/23  Yes Court Dorn PARAS, MD  nitroGLYCERIN  (NITROSTAT ) 0.4 MG SL tablet Place 1 tablet (0.4 mg total) under the tongue every 5 (five) minutes x 3 doses as needed for chest pain. 08/22/22  Yes Jerilynn Lamarr [name], NP  Omega-3 Fatty Acids (FISH OIL) 1000 MG CAPS Take 1,000 mg by  mouth daily.    Yes [provider]  ranolazine  (RANEXA ) 500 MG 12 hr tablet Take 2 tablets (1,000 mg total) by mouth 2 (two) times daily. 12/10/22  Yes Dezii, Alexandra, DO      Allergies    Influenza vaccines, Sitagliptin, Guaifenesin, Hydroxychloroquine , Latex, Nickel, Nitrofurantoin, Other, Phenylephrine-pheniramine-dm, Ultram [tramadol hcl], Wound dressing adhesive, and Escitalopram oxalate    Review of Systems   Review of Systems  Cardiovascular:  Positive for chest pain.    Physical Exam Updated Vital Signs BP 138/64   Pulse 62   Temp 98.1 F (36.7 C)   Resp 18   SpO2 99%  Physical  Exam Vitals and nursing note reviewed.  Constitutional:      General: She is not in acute distress.    Appearance: She is not toxic-appearing.  Eyes:     Pupils: Pupils are equal, round, and reactive to light.  Cardiovascular:     Rate and Rhythm: Normal rate and regular rhythm.     Heart sounds: Normal heart sounds.  Pulmonary:     Effort: Pulmonary effort is normal.     Breath sounds: Normal breath sounds.  Musculoskeletal:     Right lower leg: No edema.     Left lower leg: No edema.  Skin:    General: Skin is warm.     Capillary Refill: Capillary refill takes less than 2 seconds.  Neurological:     General: No focal deficit present.     Mental Status: She is alert.  Psychiatric:        Mood and Affect: Mood normal.        Behavior: Behavior normal.     ED Results / Procedures / Treatments   Labs (all labs ordered are listed, but only abnormal results are displayed) Labs Reviewed  CBC - Abnormal; Notable for the following components:      Result Value   Hemoglobin 11.8 (*)    All other components within normal limits  BRAIN NATRIURETIC PEPTIDE - Abnormal; Notable for the following components:   B Natriuretic Peptide 204.2 (*)    All other components within normal limits  COMPREHENSIVE METABOLIC PANEL - Abnormal; Notable for the following components:   Glucose, Bld 309 (*)    Creatinine, Ser 1.01 (*)    Total Protein 5.9 (*)    Albumin 2.8 (*)    GFR, Estimated 56 (*)    All other components within normal limits  LIPASE, BLOOD  TROPONIN I (HIGH SENSITIVITY)  TROPONIN I (HIGH SENSITIVITY)    EKG EKG Interpretation Date/Time:  Friday February 03 2023 11:50:34 EST Ventricular Rate:  65 PR Interval:  195 QRS Duration:  102 QT Interval:  486 QTC Calculation: 506 R Axis:   13  Text Interpretation: Sinus rhythm Abnormal R-wave progression, early transition Borderline T wave abnormalities Prolonged QT interval Confirmed by Neysa Clap [phone]) on 02/03/2023  1:46:59 PM  Radiology DG Chest 2 View Result Date: 02/03/2023 CLINICAL DATA:  Chest pain. EXAM: CHEST - 2 VIEW COMPARISON:  Chest radiograph dated 12/08/2022. FINDINGS: No focal consolidation, pleural effusion, pneumothorax. The cardiac silhouette is within normal limits. Atherosclerotic calcification of the aorta. Coronary vascular calcification. Degenerative changes of the spine. No acute osseous pathology. IMPRESSION: No active cardiopulmonary disease. Electronically Signed   By: Vanetta Chou M.D.   On: 02/03/2023 12:39    Procedures Procedures    Medications Ordered in ED Medications  nitroGLYCERIN  50 mg in dextrose  5 % 250 mL (0.2 mg/mL) infusion (  5 mcg/min Intravenous New Bag/Given 02/03/23 1335)  enoxaparin  (LOVENOX ) injection 40 mg (has no administration in time range)  acetaminophen  (TYLENOL ) tablet 650 mg (has no administration in time range)    Or  acetaminophen  (TYLENOL ) suppository 650 mg (has no administration in time range)  senna-docusate (Senokot-S) tablet 1 tablet (has no administration in time range)  ondansetron  (ZOFRAN ) tablet 4 mg (has no administration in time range)    Or  ondansetron  (ZOFRAN ) injection 4 mg (has no administration in time range)  insulin  aspart (novoLOG ) injection 0-15 Units (has no administration in time range)  insulin  aspart (novoLOG ) injection 0-5 Units (has no administration in time range)  aspirin  EC tablet 81 mg (has no administration in time range)  atorvastatin  (LIPITOR ) tablet 80 mg (has no administration in time range)  busPIRone  (BUSPAR ) tablet 5 mg (has no administration in time range)  cholecalciferol  (VITAMIN D3) 25 MCG (1000 UNIT) tablet 2,000 Units (has no administration in time range)  clopidogrel  (PLAVIX ) tablet 75 mg (has no administration in time range)  vitamin B-12 (CYANOCOBALAMIN ) tablet 100 mcg (has no administration in time range)  ezetimibe  (ZETIA ) tablet 10 mg (has no administration in time range)  glimepiride   (AMARYL ) tablet 4 mg (has no administration in time range)  isosorbide  mononitrate (IMDUR ) 24 hr tablet 120 mg (has no administration in time range)  empagliflozin  (JARDIANCE ) tablet 10 mg (has no administration in time range)  magnesium  oxide (MAG-OX) tablet 200 mg (has no administration in time range)  metFORMIN  (GLUCOPHAGE ) tablet 1,000 mg (has no administration in time range)  metoprolol  succinate (TOPROL -XL) 24 hr tablet 100 mg (has no administration in time range)  ranolazine  (RANEXA ) 12 hr tablet 1,000 mg (has no administration in time range)  insulin  glargine-yfgn (SEMGLEE ) injection 56 Units (has no administration in time range)    ED Course/ Medical Decision Making/ A&P Clinical Course as of 02/03/23 1532  Fri Feb 03, 2023  1145 Admitted for CP 12/10/22: Hospital Course: Ms. Higby is an 83 year old female with severe nonrevascularizable three-vessel CAD, hypertension, hyperlipidemia, prior CVA, diabetes, fibromyalgia.  She initially presented to the ED complaining of chest pain.  She tried multiple doses of sublingual nitro with recurring chest pain outpatient and thus presented to the ER.  Initially she was evaluated by cardiology who determined no acute intervention and patient went home.  Chest pain recurred so she returned to the ER again.  She was admitted for pain control.  Unfortunately given her significant CAD with other comorbidities, the patient is a poor candidate for grafting or PCI.  Cardiology recommended medical therapy with increased antianginal treatment.  We increased her dose of Ranexa .  We also consulted palliative care.  At this time patient is not interested in hospice but was open to palliative care as a resource.  She was monitored on telemetry and chest pain gradually resolved.  She required as needed morphine  only once during her stay.  She did not need prescription at discharge.  She will continue to follow with palliative care as part of her care team  outpatient.  Additionally, her blood pressure medications were titrated slightly as she was bradycardic and at times hypotensive on her current regimen.  She was sent home with a lower dose of metoprolol  and higher dose of Ranexa .  She will follow closely with cardiology, PCP, and palliative care outpatient.  Her care plan was discussed extensively with the patient as well as with her son, daughter, and daughter-in-law who are all at bedside during  times of evaluation.  [TY]    Clinical Course User Index [TY] Neysa Caron PARAS, DO                                 Medical Decision Making This is a well-appearing 83 year old female presenting emergency department for chest pain.  Per chart review has significant multivessel disease.  See ED course.  It appears she is not a candidate for intervention per cardiology prior notes.  Started on nitro drip.  She is actively having chest pain.  Initial troponin negative.  EKG without STEMI.  No significant leukocytosis to suggest systemic infection.  No metabolic derangements.  Given her nausea and vomiting got liver enzymes which were all normal.  Lipase normal.  Pancreatitis unlikely.  Given patient's age and comorbid medical conditions with severe multivessel disease will admit for palliative pain control  Amount and/or Complexity of Data Reviewed Independent Historian:     Details: Family members note that patient typically responds quite well to nitro External Data Reviewed:     Details: See ED course Labs: ordered. Radiology: ordered. ECG/medicine tests: ordered. Discussion of management or test interpretation with external provider(s): Case discussed with hospitalist who will admit patient.  Risk Decision regarding hospitalization.         Final Clinical Impression(s) / ED Diagnoses Final diagnoses:  Unstable angina [facility])    Rx / DC Orders ED Discharge Orders     None         Neysa Caron PARAS, DO 02/03/23 1532

## 2023-02-03 NOTE — H&P (Signed)
 History and Physical    Patient: Jill Howard FMW:[id number] DOB: 1941-01-04 DOA: 02/03/2023 DOS: the patient was seen and examined on 02/03/2023 PCP: [name], PA-C  Patient coming from: Home  Chief Complaint:  Chief Complaint  Patient presents with   Chest Pain   HPI: Jill Howard is a 83 y.o. female with medical history significant of severe nonrevascularizable three-vessel CAD not amendable to PCI, hypertension, hyperlipidemia, prior CVA, diabetes, anxiety, and fibromyalgia currently followed by palliative care in the outpatient presented to the ED for evaluation of chest pain. Patient reports that since her hospitalization last November for unstable angina, she has required nitroglycerin  almost once to twice per week.  States significant exertion aggravates her chest and back pain. Last night, as she was standing up, she had back pain with associated nausea and vomiting.  This morning, she had worsening back pain as well as chest pain described as elephant sitting on her chest. She took 2 nitroglycerin  tabs without significant relief so EMS was called. Patient was given 2 more nitroglycerin  tabs by EMS without significant relief of her pain. States she continues to have some chest pressure but her back pain has resolved.  She denies any nausea or vomiting today. She endorses dyspnea on exertion and occasional ankle swelling but denies shortness of breath at rest, dizziness, abdominal pain, palpitations or weakness. She does report increased anxiety with her BP being high since her previous stroke was in the setting of elevated BP.  ED course: Initial vitals were temp 98.1, RR 17, HR 65, BP 161/68, SpO2 99% on room air Labs show WBC 6.0, Hgb 11.8, sodium 135, K+ 4.7, creatinine 1.01, lipase 29, platelet 189, troponin 8, BNP 204 EKG shows normal sinus rhythm early R wave progression and QTc of 506 no acute ischemic changes Chest x-ray with no active disease Patient was started on  nitroglycerin  drip [facility] was consulted for admission  Review of Systems: As mentioned in the history of present illness. All other systems reviewed and are negative. Past Medical History:  Diagnosis Date   Diabetes mellitus without complication (HCC)    Gallstones    History of stroke 10/25/2016   Hypertension    Kidney stone    Kidney stone    Stroke (HCC) 10/24/1996   Vertigo    Past Surgical History:  Procedure Laterality Date   ABDOMINAL HYSTERECTOMY     KIDNEY STONE SURGERY     LEFT HEART CATH AND CORONARY ANGIOGRAPHY N/A 06/30/2022   Procedure: LEFT HEART CATH AND CORONARY ANGIOGRAPHY;  Surgeon: Wonda Sharper, MD;  Location: [facility] INVASIVE CV LAB;  Service: Cardiovascular;  Laterality: N/A;   Social History:  reports that she quit smoking about 42 years ago. Her smoking use included cigarettes. She has never used smokeless tobacco. She reports that she does not drink alcohol and does not use drugs.  Allergies  Allergen Reactions   Influenza Vaccines Swelling    Arm was swollen in 2002    Sitagliptin Swelling   Guaifenesin Other (See Comments)    Unknown   Hydroxychloroquine  Other (See Comments)    Eye pain   Latex Other (See Comments)    Unknown   Nickel Other (See Comments)    Causes swelling of the arms   Nitrofurantoin Nausea Only   Other Other (See Comments)    Band-Aid   Phenylephrine-Pheniramine-Dm Other (See Comments)    Unknown; causes issues with blood pressure   Ultram [Tramadol Hcl] Other (See Comments)    hallucinations  Wound Dressing Adhesive Itching   Escitalopram Oxalate Other (See Comments)    Hallucinations    Family History  Problem Relation Age of Onset   Stroke Other    Heart attack Other    Cancer Other    Diabetes Other    Stroke Mother    Heart attack Mother    Stroke Maternal Aunt    Stroke Paternal Aunt    Rheum arthritis Paternal Aunt    Stroke Brother    Heart attack Brother    COPD Brother    Rheum arthritis Paternal Aunt     Diabetes Son    Heart disease Son    Diabetes Daughter     Prior to Admission medications   Medication Sig Start Date End Date Taking? Authorizing Provider  acetaminophen  (TYLENOL ) 500 MG tablet Take 1,000 mg by mouth every 6 (six) hours as needed for moderate pain (pain score 4-6) (Pain).    [provider]  aspirin  EC 81 MG tablet Take 1 tablet (81 mg total) by mouth daily. Swallow whole. 07/02/22   Jillian Buttery, MD  atorvastatin  (LIPITOR ) 80 MG tablet Take 1 tablet (80 mg total) by mouth at bedtime. 08/22/22   Jerilynn Lamarr [name], NP  busPIRone  (BUSPAR ) 5 MG tablet Take 5 mg by mouth 2 (two) times daily. 12/16/22 06/14/23  [provider]  cetirizine (ZYRTEC) 10 MG tablet Take 10 mg by mouth at bedtime as needed for allergies or rhinitis.    [provider]  Cholecalciferol  (VITAMIN D ) 50 MCG (2000 UT) tablet Take 1 tablet (2,000 Units total) by mouth daily. 07/01/22   Jillian Buttery, MD  clopidogrel  (PLAVIX ) 75 MG tablet Take 1 tablet (75 mg total) by mouth daily. 08/22/22   Jerilynn Lamarr [name], NP  Cyanocobalamin  (VITAMIN B-12 PO) Take 1 tablet by mouth daily.    [provider]  ezetimibe  (ZETIA ) 10 MG tablet Take 1 tablet (10 mg total) by mouth daily. 08/22/22   Jerilynn Lamarr [name], NP  glimepiride  (AMARYL ) 4 MG tablet Take 4 mg by mouth daily with breakfast. 10/03/22 04/01/23  [provider]  HUMALOG KWIKPEN 100 UNIT/ML KwikPen Inject 5-22 Units into the skin as needed. Use at lunchtime if levels are still high; use sliding scale after up to a maximum of 22 units per day 06/25/20   [provider]  Insulin  Glargine (TOUJEO  SOLOSTAR Gisela) Inject 56 Units into the skin at bedtime.    [provider]  isosorbide  mononitrate (IMDUR ) 120 MG 24 hr tablet Take 1 tablet (120 mg total) by mouth every morning. 09/07/22   Darryle Thom CROME, PA-C  JARDIANCE  10 MG TABS tablet Take 10 mg by mouth daily. 11/11/22   [provider]  Magnesium   250 MG TABS Take 250 mg by mouth daily.    [provider]  metFORMIN  (GLUCOPHAGE ) 1000 MG tablet Take 1,000 mg by mouth 2 (two) times daily with a meal.    [provider]  metoprolol  succinate (TOPROL -XL) 100 MG 24 hr tablet Take 1 tablet (100 mg total) by mouth at bedtime. Take with or immediately following a meal. 01/24/23   Court Dorn PARAS, MD  nitroGLYCERIN  (NITROSTAT ) 0.4 MG SL tablet Place 1 tablet (0.4 mg total) under the tongue every 5 (five) minutes x 3 doses as needed for chest pain. 08/22/22   Jerilynn Lamarr [name], NP  Omega-3 Fatty Acids (FISH OIL) 1000 MG CAPS Take 1,000 mg by mouth daily.     [provider]  ranolazine  (RANEXA ) 500 MG 12 hr tablet Take 2 tablets (1,000 mg total) by mouth 2 (two) times daily. 12/10/22   Leesa Kast, DO    Physical Exam: Vitals:   02/03/23 1149  BP: (!) 161/68  Pulse: 65  Resp: 17  Temp: 98.1 F (36.7 C)  SpO2: 99%   General: Pleasant, well-appearing elderly woman laying in bed. No acute distress. HEENT: [place]/AT. Anicteric sclera CV: RRR. No murmurs, rubs, or gallops. No LE edema Pulmonary: Lungs CTAB. Normal effort. No wheezing or rales. Abdominal: Soft, nontender, nondistended. Normal bowel sounds. Extremities: Palpable radial and DP pulses. Normal ROM. Skin: Warm and dry. No obvious rash or lesions. Neuro: A&Ox3. Moves all extremities. Normal sensation to light touch. No focal deficit. Psych: Normal mood and affect  Data Reviewed: Labs show WBC 6.0, Hgb 11.8, sodium 135, K+ 4.7, creatinine 1.01, lipase 29, platelet 189, troponin 8, BNP 204 EKG shows normal sinus rhythm early R wave progression and QTc of 506 no acute ischemic changes Chest x-ray with no active disease   Assessment and Plan: Jill Howard is a 83 y.o. female with medical history significant of severe nonrevascularizable three-vessel CAD not amendable to PCI, hypertension, hyperlipidemia, prior CVA, diabetes, anxiety, and fibromyalgia  currently followed by palliative care in the outpatient presented to the ED for evaluation of chest pain admitted for unstable angina.  # Unstable angina # Severe three-vessel CAD Patient has a history of very severe, diffuse three-vessel CAD with poor targets for grafting based on left heart cath in June last year.  Due to her comorbidities, she is not a candidate for PCI. She has had recurrent chest pain leading to multiple ED visits and hospitalizations over the last 6 months.  Ranexa  was increased to 1000 mg twice daily during the last hospitalization. Patient currently followed by palliative care in the outpatient.  She had typical chest pain this morning that was not responsive to multiple doses of nitroglycerin . She is currently on nitro drip with improvement in her pain. Troponin negative x1, EKG without acute ischemic changes. -Admit for observation on telemetry bed -Continue nitro drip -Continue Ranexa  and Imdur  -Continue Plavix , atorvastatin  and aspirin  -Follow-up repeat troponin  # T2DM Last A1c 7.6% 1 month ago.  Home regimen includes Jardiance  10 mg daily, metformin  1000 mg twice daily, glimepiride  4 mg daily, insulin  glargine 56 units at bedtime and sliding scale NovoLog . Blood glucose of 309 on CMP -Semglee  56 units at bedtime -Continue glimepiride , Jardiance  and metformin  -SSI with meals and at bedtime, CBG monitoring  # HTN BP elevated with SBP in the 130s to 160s. BP likely elevated in the setting of chest pain. -Continue Toprol -XL  # HLD # Hx of CVA -Continue atorvastatin , Plavix , aspirin  and Zetia  -Follow-up repeat lipid panel  # Anxiety -Continue BuSpar    Advance Care Planning:   Code Status: Limited: Do not attempt resuscitation (DNR) -DNR-LIMITED -Do Not Intubate/DNI    Consults: None  Family Communication: Discussed admission with family at bedside  Severity of Illness: The appropriate patient status for this patient is OBSERVATION. Observation status is  judged to be reasonable and necessary in order to provide the required intensity of service to ensure the patient's safety. The patient's presenting symptoms, physical exam findings, and initial radiographic and laboratory data in the context of their medical condition is felt to place them at decreased risk for further clinical deterioration. Furthermore, it is anticipated that the patient will be medically stable for discharge from the hospital within 2 midnights  of admission.   Author: Claretta CHRISTELLA Alderman, MD 02/03/2023 2:12 PM  For on call review [email or web address].

## 2023-02-03 NOTE — Consult Note (Signed)
 Cardiology Consult    Patient ID: Jill Howard MRN: [id number], DOB/AGE: 03-02-1940   Admit date: 02/03/2023 Date of Consult: 02/03/2023  Primary Physician: [name], PA-C Primary Cardiologist: Dorn Lesches, MD Requesting Provider: Terry LOISE Hurst  Patient Profile    Jill Howard is a 83 y.o. female with a history of unrevascularized, calcified, multivessel coronary artery disease, CVA, HTN, HLD, type 2 diabetes mellitus who is being seen today for the evaluation of chest pain at the request of Dr. Hurst.  Past Medical History   Past Medical History:  Diagnosis Date   Diabetes mellitus without complication (HCC)    Gallstones    History of stroke 10/25/2016   Hypertension    Kidney stone    Kidney stone    Stroke (HCC) 10/24/1996   Vertigo     Past Surgical History:  Procedure Laterality Date   ABDOMINAL HYSTERECTOMY     KIDNEY STONE SURGERY     LEFT HEART CATH AND CORONARY ANGIOGRAPHY N/A 06/30/2022   Procedure: LEFT HEART CATH AND CORONARY ANGIOGRAPHY;  Surgeon: Wonda Sharper, MD;  Location: [facility] INVASIVE CV LAB;  Service: Cardiovascular;  Laterality: N/A;     Allergies  Allergies  Allergen Reactions   Influenza Vaccines Swelling    Arm was swollen in 2002    Sitagliptin Swelling   Guaifenesin Other (See Comments)    Unknown   Hydroxychloroquine  Other (See Comments)    Eye pain   Latex Other (See Comments)    Unknown   Nickel Other (See Comments)    Causes swelling of the arms   Nitrofurantoin Nausea Only   Other Other (See Comments)    Band-Aid   Phenylephrine-Pheniramine-Dm Other (See Comments)    Unknown; causes issues with blood pressure   Ultram [Tramadol Hcl] Other (See Comments)    hallucinations   Wound Dressing Adhesive Itching   Escitalopram Oxalate Other (See Comments)    Hallucinations    History of Present Illness    Jill Howard was admitted to the hospital earlier today with acute onset substernal, chest tightness and chest  pressure that she describes as something heavy sitting in the middle of her chest.  She had onset of pain this AM while eating breakfast.  Took 2 nitroglycerins with no relief, EMS was called and she was given 2 addition nitroglycerin  with some improvement.  She did have an episode of nausea with vomiting last night.  None this AM.  Has tolerated her PO medications.  Denies SOB, but daughter states she was SOB while walking to the restroom while admitted.  No orthopnea, PND.  Has baseline lower extremity edema that is perhaps slightly worse in the left lower extremity.   In the ER, she was hypertensive and subsequently started on a nitroglycerin  infusion. Troponins were negative at 8 and 11.  BNP was elevated to 204.2.  Admitted to medicine for further management.   Cardiology was consulted to aid in management of her chest pain.  At the time of my evaluation, she is chest pain free on 30 mcg/min nitroglycerin  infusion.   She last underwent LHC in June 2024 which demonstrated 50% distal left main stenosis, severe mid LAD stenosis with diffuse calcification throughout the mid vessel and a small caliber distal vessel.  Lcx with subtotal occlusion and severely diseased obtuse marginal branches.  Her RCA was noted to have moderate to severe distal disease.   Inpatient Medications     [START ON 02/04/2023] aspirin  EC  81 mg Oral Daily  atorvastatin   80 mg Oral QHS   busPIRone   5 mg Oral BID   cholecalciferol   2,000 Units Oral Daily   clopidogrel   75 mg Oral Daily   empagliflozin   10 mg Oral Daily   enoxaparin  (LOVENOX ) injection  40 mg Subcutaneous Q24H   ezetimibe   10 mg Oral Daily   [START ON 02/04/2023] glimepiride   4 mg Oral Q breakfast   insulin  aspart  0-15 Units Subcutaneous TID WC   insulin  aspart  0-5 Units Subcutaneous QHS   insulin  glargine-yfgn  56 Units Subcutaneous QHS   [START ON 02/04/2023] isosorbide  mononitrate  120 mg Oral q morning   magnesium  oxide  200 mg Oral Daily   metFORMIN    1,000 mg Oral BID WC   metoprolol  succinate  50 mg Oral QHS   ranolazine   1,000 mg Oral BID   vitamin B-12  100 mcg Oral Daily    Family History    Family History  Problem Relation Age of Onset   Stroke Other    Heart attack Other    Cancer Other    Diabetes Other    Stroke Mother    Heart attack Mother    Stroke Maternal Aunt    Stroke Paternal Aunt    Rheum arthritis Paternal Aunt    Stroke Brother    Heart attack Brother    COPD Brother    Rheum arthritis Paternal Aunt    Diabetes Son    Heart disease Son    Diabetes Daughter    She indicated that her mother is deceased. She indicated that her father is deceased. She indicated that her brother is deceased. She indicated that her daughter is alive. She indicated that her son is alive. She indicated that her maternal aunt is deceased. She indicated that both of her paternal aunts are deceased. She indicated that the status of her other is unknown.   Social History    Social History   Socioeconomic History   Marital status: Married    Spouse name: Not on file   Number of children: Not on file   Years of education: Not on file   Highest education level: Not on file  Occupational History   Not on file  Tobacco Use   Smoking status: Former    Current packs/day: 0.00    Types: Cigarettes    Quit date: 50    Years since quitting: 42.0   Smokeless tobacco: Never  Vaping Use   Vaping status: Never Used  Substance and Sexual Activity   Alcohol use: No   Drug use: No   Sexual activity: Not Currently  Other Topics Concern   Not on file  Social History Narrative   Not on file   Social Drivers of Health   Financial Resource Strain: Not on file  Food Insecurity: No Food Insecurity (02/03/2023)   Hunger Vital Sign    Worried About Running Out of Food in the Last Year: Never true    Ran Out of Food in the Last Year: Never true  Transportation Needs: No Transportation Needs (02/03/2023)   PRAPARE - Therapist, Art (Medical): No    Lack of Transportation (Non-Medical): No  Physical Activity: Not on file  Stress: Not on file  Social Connections: Moderately Isolated (02/03/2023)   Social Connection and Isolation Panel [NHANES]    Frequency of Communication with Friends and Family: More than three times a week    Frequency of Social Gatherings with  Friends and Family: More than three times a week    Attends Religious Services: Never    Database Administrator or Organizations: No    Attends Banker Meetings: Never    Marital Status: Married  Catering Manager Violence: Not At Risk (02/03/2023)   Humiliation, Afraid, Rape, and Kick questionnaire    Fear of Current or Ex-Partner: No    Emotionally Abused: No    Physically Abused: No    Sexually Abused: No     Review of Systems    General:  No chills, fever, night sweats or weight changes.  Cardiovascular:  Positive for chest pain.  No orthopnea, palpitations, paroxysmal nocturnal dyspnea. Dermatological: No rash, lesions/masses Respiratory: No cough, dyspnea Urologic: No hematuria, dysuria Abdominal:   No nausea, vomiting, diarrhea, bright red blood per rectum, melena, or hematemesis Neurologic:  No visual changes, wkns, changes in mental status. All other systems reviewed and are otherwise negative except as noted above.  Physical Exam    Blood pressure (!) 186/78, pulse 69, temperature 98.8 F (37.1 C), temperature source Oral, resp. rate 18, height 5' 2 (1.575 m), weight 76.7 kg, SpO2 96%.  General: Pleasant, NAD Psych: Normal affect. Neuro: Alert and oriented X 3. Moves all extremities spontaneously. HEENT: Normal  Neck: Supple without bruits or JVD. Lungs:  Resp regular and unlabored, CTA. Heart: NRRR no s3, s4, or murmurs. Abdomen: Soft, non-tender, non-distended, BS + x 4.  Extremities: DP/PT/Radials 2+ and equal bilaterally.  Labs    High Sensitivity Troponin Recent Labs  Lab  02/03/23 1236 02/03/23 1347  TROPONINIHS 8 11     Cardiac Enzymes No results for input(s): TROPONINI in the last 168 hours. No results for input(s): TROPIPOC in the last 168 hours.   Lab Results  Component Value Date   WBC 6.0 02/03/2023   HGB 11.8 (L) 02/03/2023   HCT 36.1 02/03/2023   MCV 93.3 02/03/2023   PLT 189 02/03/2023    Recent Labs  Lab 02/03/23 1236  NA 135  K 4.7  CL 100  CO2 22  BUN 19  CREATININE 1.01*  CALCIUM  9.6  PROT 5.9*  BILITOT 1.0  ALKPHOS 55  ALT 14  AST 20  GLUCOSE 309*   Lab Results  Component Value Date   CHOL 150 12/24/2019   HDL 47 12/24/2019   LDLCALC 83 12/24/2019   TRIG 109 12/24/2019   Lab Results  Component Value Date   DDIMER 0.94 (H) 06/29/2022     Radiology Studies    DG Chest 2 View Result Date: 02/03/2023 CLINICAL DATA:  Chest pain. EXAM: CHEST - 2 VIEW COMPARISON:  Chest radiograph dated 12/08/2022. FINDINGS: No focal consolidation, pleural effusion, pneumothorax. The cardiac silhouette is within normal limits. Atherosclerotic calcification of the aorta. Coronary vascular calcification. Degenerative changes of the spine. No acute osseous pathology. IMPRESSION: No active cardiopulmonary disease. Electronically Signed   By: Vanetta Chou M.D.   On: 02/03/2023 12:39    ECG & Cardiac Imaging    Normal sinus rhythm with nonspecific ST-T wave changes - personally reviewed.  Assessment & Plan    Jill Howard is a 83 y.o. female with a history of unrevascularized, calcified, multivessel coronary artery disease, CVA, HTN, HLD, type 2 diabetes mellitus who is being seen today for the evaluation of chest pain at the request of Dr. Shona.  She has unrevascularized and severe multivessel CAD.  She is on great medical therapy with BB, high dose IMDUR , high dose ranexa .  Amlodipine  was recently discontinued due to low blood pressures, but she is hypertensive here in the hospital.  I suspect that her hypertension is a significant  contributor to her chest pain.  Optimizing BP control should help with her symptoms.  Given recurrent presentations and hospitalizations for chest pain, it may be worthwhile to rediscuss percutaneous revascularization with the interventional cardiology team given her significant symptoms despite great medical therapy.    For now, plan to continue her nitroglycerin  infusion for pain control.  Agree with as needed opioids.    Recommendations: - can uptitrate nitroglycerin  infusion as tolerated hemodynamically for pain control - agree with PRN opioids  - continue metoprolol  succinate 50 mg daily  - continue IMDUR  120 mg daily  - continue ranolazine  1000 mg BID  - continue asa 81 mg daily  - continue plavix  75 mg daily  - continue high intensity statin  - will consider re-discussing with interventional cardiology for possible revascularization   Signed, Darleene JONETTA Mace, MD 02/03/2023, 10:51 PM  For questions or updates, please contact   Please consult [email or web address] for contact info under Cardiology/STEMI.

## 2023-02-03 NOTE — ED Triage Notes (Signed)
 PT BIB EMS for chest pain, starting at 0200 this morning, with one episode of nausea and vomiting, and shortness of breath on exertion. States it does feel like an elephant is sitting on her chest, pain radiating to her back.   HX of CAD and blocked arteries, only being able to be treated with medication  BP 148/66 HR 67 O2 97 CBG 352 Aspirin  324 Nitroglycerin   3  ( 2 by pt, 1 by EMS ) 22 gauge IV Left hand

## 2023-02-03 NOTE — ED Notes (Signed)
 ED TO INPATIENT HANDOFF REPORT  ED Nurse Name and Phone #: Jill Howard Name/Age/Gender Jill Howard 83 y.o. female Room/Bed: 028C/028C  Code Status   Code Status: Limited: Do not attempt resuscitation (DNR) -DNR-LIMITED -Do Not Intubate/DNI   Home/SNF/Other Home Patient oriented to: self, place, time, and situation Is this baseline? Yes   Triage Complete: Triage complete  Chief Complaint Unstable angina (HCC) [I20.0]  Triage Note PT BIB EMS for chest pain, starting at 0200 this morning, with one episode of nausea and vomiting, and shortness of breath on exertion. States it does feel like an elephant is sitting on her chest, pain radiating to her back.   HX of CAD and blocked arteries, only being able to be treated with medication  BP 148/66 HR 67 O2 97 CBG 352 Aspirin  324 Nitroglycerin   3  ( 2 by pt, 1 by EMS ) 22 gauge IV Left hand     Allergies Allergies  Allergen Reactions   Influenza Vaccines Swelling    Arm was swollen in 2002    Sitagliptin Swelling   Guaifenesin Other (See Comments)    Unknown   Hydroxychloroquine  Other (See Comments)    Eye pain   Latex Other (See Comments)    Unknown   Nickel Other (See Comments)    Causes swelling of the arms   Nitrofurantoin Nausea Only   Other Other (See Comments)    Band-Aid   Phenylephrine-Pheniramine-Dm Other (See Comments)    Unknown; causes issues with blood pressure   Ultram [Tramadol Hcl] Other (See Comments)    hallucinations   Wound Dressing Adhesive Itching   Escitalopram Oxalate Other (See Comments)    Hallucinations    Level of Care/Admitting Diagnosis ED Disposition     ED Disposition  Admit   Condition  --   Comment  Hospital Area: [facility] [[id number]]  Level of Care: Telemetry Medical [104]  May place patient in observation at [facility] or [facility] if equivalent level of care is available:: No  Covid Evaluation: Asymptomatic - no recent exposure (last 10  days) testing not required  Diagnosis: Unstable angina [facility]) [[id number]]  Admitting Physician: Jill Howard [[id number]]  Attending Physician: Jill Howard [[id number]]          B Medical/Surgery History Past Medical History:  Diagnosis Date   Diabetes mellitus without complication (HCC)    Gallstones    History of stroke 10/25/2016   Hypertension    Kidney stone    Kidney stone    Stroke (HCC) 10/24/1996   Vertigo    Past Surgical History:  Procedure Laterality Date   ABDOMINAL HYSTERECTOMY     KIDNEY STONE SURGERY     LEFT HEART CATH AND CORONARY ANGIOGRAPHY N/A 06/30/2022   Procedure: LEFT HEART CATH AND CORONARY ANGIOGRAPHY;  Surgeon: Wonda Sharper, MD;  Location: [facility] INVASIVE CV LAB;  Service: Cardiovascular;  Laterality: N/A;     A IV Location/Drains/Wounds Patient Lines/Drains/Airways Status     Active Line/Drains/Airways     Name Placement date Placement time Site Days   Peripheral IV 02/03/23 Left;Posterior Wrist 02/03/23  --  Wrist  less than 1            Intake/Output Last 24 hours No intake or output data in the 24 hours ending 02/03/23 1657  Labs/Imaging Results for orders placed or performed during the hospital encounter of 02/03/23 (from the past 48 hours)  CBC     Status: Abnormal  Collection Time: 02/03/23 12:36 PM  Result Value Ref Range   WBC 6.0 4.0 - 10.5 K/uL   RBC 3.87 3.87 - 5.11 MIL/uL   Hemoglobin 11.8 (L) 12.0 - 15.0 g/dL   HCT 63.8 63.9 - 53.9 %   MCV 93.3 80.0 - 100.0 fL   MCH 30.5 26.0 - 34.0 pg   MCHC 32.7 30.0 - 36.0 g/dL   RDW 86.7 88.4 - 84.4 %   Platelets 189 150 - 400 K/uL   nRBC 0.0 0.0 - 0.2 %    Comment: Performed at [facility], [address] [place], [place] [address]  Troponin I (High Sensitivity)     Status: None   Collection Time: 02/03/23 12:36 PM  Result Value Ref Range   Troponin I (High Sensitivity) 8 <18 ng/L    Comment: (NOTE) Elevated high sensitivity troponin I (hsTnI) values and  significant  changes across serial measurements may suggest ACS but many other  chronic and acute conditions are known to elevate hsTnI results.  Refer to the Links section for chest pain algorithms and additional  guidance. Performed at [facility], [address], [place], [place] [address]   Brain natriuretic peptide     Status: Abnormal   Collection Time: 02/03/23 12:36 PM  Result Value Ref Range   B Natriuretic Peptide 204.2 (H) 0.0 - 100.0 pg/mL    Comment: Performed at [facility], [address] [place], [place] [address]  Comprehensive metabolic panel     Status: Abnormal   Collection Time: 02/03/23 12:36 PM  Result Value Ref Range   Sodium 135 135 - 145 mmol/L   Potassium 4.7 3.5 - 5.1 mmol/L   Chloride 100 98 - 111 mmol/L   CO2 22 22 - 32 mmol/L   Glucose, Bld 309 (H) 70 - 99 mg/dL    Comment: Glucose reference range applies only to samples taken after fasting for at least 8 hours.   BUN 19 8 - 23 mg/dL   Creatinine, Ser 8.98 (H) 0.44 - 1.00 mg/dL   Calcium  9.6 8.9 - 10.3 mg/dL   Total Protein 5.9 (L) 6.5 - 8.1 g/dL   Albumin 2.8 (L) 3.5 - 5.0 g/dL   AST 20 15 - 41 U/L   ALT 14 0 - 44 U/L   Alkaline Phosphatase 55 38 - 126 U/L   Total Bilirubin 1.0 0.0 - 1.2 mg/dL   GFR, Estimated 56 (L) >60 mL/min    Comment: (NOTE) Calculated using the CKD-EPI Creatinine Equation (2021)    Anion gap 13 5 - 15    Comment: Performed at [facility], [address] [place], [place] [address]  Lipase, blood     Status: None   Collection Time: 02/03/23 12:36 PM  Result Value Ref Range   Lipase 29 11 - 51 U/L    Comment: Performed at [facility], [address], [place], [place] [address]  Troponin I (High Sensitivity)     Status: None   Collection Time: 02/03/23  1:47 PM  Result Value Ref Range   Troponin I (High Sensitivity) 11 <18 ng/L    Comment: (NOTE) Elevated high sensitivity troponin I (hsTnI) values and significant  changes across serial  measurements may suggest ACS but many other  chronic and acute conditions are known to elevate hsTnI results.  Refer to the Links section for chest pain algorithms and additional  guidance. Performed at [facility], [address] [place], [place] [address]   CBG monitoring, ED     Status: Abnormal   Collection Time: 02/03/23  4:15 PM  Result Value Ref Range   Glucose-Capillary 290 (H) 70 - 99 mg/dL    Comment: Glucose reference range applies only to samples taken after fasting for at least 8 hours.   DG Chest 2 View Result Date: 02/03/2023 CLINICAL DATA:  Chest pain. EXAM: CHEST - 2 VIEW COMPARISON:  Chest radiograph dated 12/08/2022. FINDINGS: No focal consolidation, pleural effusion, pneumothorax. The cardiac silhouette is within normal limits. Atherosclerotic calcification of the aorta. Coronary vascular calcification. Degenerative changes of the spine. No acute osseous pathology. IMPRESSION: No active cardiopulmonary disease. Electronically Signed   By: Vanetta Chou M.D.   On: 02/03/2023 12:39    Pending Labs Unresulted Labs (From admission, onward)     Start     Ordered   02/04/23 0500  Lipid panel  Tomorrow morning,   R        02/03/23 1517   02/04/23 0500  Basic metabolic panel  Tomorrow morning,   R        02/03/23 1517   02/04/23 0500  CBC  Tomorrow morning,   R        02/03/23 1517            Vitals/Pain Today's Vitals   02/03/23 1149 02/03/23 1230 02/03/23 1400 02/03/23 1608  BP: (!) 161/68  138/64   Pulse: 65  62   Resp: 17  18   Temp: 98.1 F (36.7 C)   98.2 F (36.8 C)  SpO2: 99%  99%   PainSc:  3       Isolation Precautions No active isolations  Medications Medications  nitroGLYCERIN  50 mg in dextrose  5 % 250 mL (0.2 mg/mL) infusion (5 mcg/min Intravenous New Bag/Given 02/03/23 1335)  enoxaparin  (LOVENOX ) injection 40 mg (40 mg Subcutaneous Given 02/03/23 1606)  acetaminophen  (TYLENOL ) tablet 650 mg (has no administration in time  range)    Or  acetaminophen  (TYLENOL ) suppository 650 mg (has no administration in time range)  senna-docusate (Senokot-S) tablet 1 tablet (has no administration in time range)  ondansetron  (ZOFRAN ) tablet 4 mg (has no administration in time range)    Or  ondansetron  (ZOFRAN ) injection 4 mg (has no administration in time range)  insulin  aspart (novoLOG ) injection 0-15 Units ( Subcutaneous Not Given 02/03/23 1618)  insulin  aspart (novoLOG ) injection 0-5 Units (has no administration in time range)  aspirin  EC tablet 81 mg (has no administration in time range)  atorvastatin  (LIPITOR ) tablet 80 mg (has no administration in time range)  busPIRone  (BUSPAR ) tablet 5 mg (has no administration in time range)  cholecalciferol  (VITAMIN D3) 25 MCG (1000 UNIT) tablet 2,000 Units (2,000 Units Oral Given 02/03/23 1607)  clopidogrel  (PLAVIX ) tablet 75 mg (75 mg Oral Given 02/03/23 1607)  vitamin B-12 (CYANOCOBALAMIN ) tablet 100 mcg (100 mcg Oral Given 02/03/23 1630)  ezetimibe  (ZETIA ) tablet 10 mg (10 mg Oral Given 02/03/23 1607)  glimepiride  (AMARYL ) tablet 4 mg (has no administration in time range)  isosorbide  mononitrate (IMDUR ) 24 hr tablet 120 mg (has no administration in time range)  empagliflozin  (JARDIANCE ) tablet 10 mg (10 mg Oral Given 02/03/23 1607)  magnesium  oxide (MAG-OX) tablet 200 mg (200 mg Oral Given 02/03/23 1607)  metFORMIN  (GLUCOPHAGE ) tablet 1,000 mg (1,000 mg Oral Given 02/03/23 1612)  metoprolol  succinate (TOPROL -XL) 24 hr tablet 100 mg (has no administration in time range)  ranolazine  (RANEXA ) 12 hr tablet  1,000 mg (has no administration in time range)  insulin  glargine-yfgn (SEMGLEE ) injection 56 Units (has no administration in time range)    Mobility walks     Focused Assessments Cardiac Assessment Handoff:    Lab Results  Component Value Date   TROPONINI <0.03 10/24/2016   Lab Results  Component Value Date   DDIMER 0.94 (H) 06/29/2022   Does the Patient currently have  chest pain? No    R Recommendations: See Admitting Provider Note  Report given to:   Additional Notes: PT is AO, walky talky, continent, brittle diabetic, said CBG was 47 yesterday morning, didn't want insulin  this evening since CBG was coming down

## 2023-02-04 DIAGNOSIS — Z823 Family history of stroke: Secondary | ICD-10-CM | POA: Diagnosis not present

## 2023-02-04 DIAGNOSIS — E11649 Type 2 diabetes mellitus with hypoglycemia without coma: Secondary | ICD-10-CM | POA: Diagnosis not present

## 2023-02-04 DIAGNOSIS — I25110 Atherosclerotic heart disease of native coronary artery with unstable angina pectoris: Secondary | ICD-10-CM | POA: Diagnosis present

## 2023-02-04 DIAGNOSIS — F419 Anxiety disorder, unspecified: Secondary | ICD-10-CM | POA: Diagnosis present

## 2023-02-04 DIAGNOSIS — Z8249 Family history of ischemic heart disease and other diseases of the circulatory system: Secondary | ICD-10-CM | POA: Diagnosis not present

## 2023-02-04 DIAGNOSIS — Z7902 Long term (current) use of antithrombotics/antiplatelets: Secondary | ICD-10-CM | POA: Diagnosis not present

## 2023-02-04 DIAGNOSIS — I200 Unstable angina: Secondary | ICD-10-CM | POA: Diagnosis present

## 2023-02-04 DIAGNOSIS — Z515 Encounter for palliative care: Secondary | ICD-10-CM | POA: Diagnosis not present

## 2023-02-04 DIAGNOSIS — E669 Obesity, unspecified: Secondary | ICD-10-CM | POA: Diagnosis present

## 2023-02-04 DIAGNOSIS — F39 Unspecified mood [affective] disorder: Secondary | ICD-10-CM | POA: Insufficient documentation

## 2023-02-04 DIAGNOSIS — Z66 Do not resuscitate: Secondary | ICD-10-CM | POA: Diagnosis present

## 2023-02-04 DIAGNOSIS — Z6830 Body mass index (BMI) 30.0-30.9, adult: Secondary | ICD-10-CM | POA: Diagnosis not present

## 2023-02-04 DIAGNOSIS — Z90710 Acquired absence of both cervix and uterus: Secondary | ICD-10-CM | POA: Diagnosis not present

## 2023-02-04 DIAGNOSIS — Z8616 Personal history of COVID-19: Secondary | ICD-10-CM | POA: Diagnosis not present

## 2023-02-04 DIAGNOSIS — I10 Essential (primary) hypertension: Secondary | ICD-10-CM | POA: Diagnosis present

## 2023-02-04 DIAGNOSIS — Z833 Family history of diabetes mellitus: Secondary | ICD-10-CM | POA: Diagnosis not present

## 2023-02-04 DIAGNOSIS — Z7982 Long term (current) use of aspirin: Secondary | ICD-10-CM | POA: Diagnosis not present

## 2023-02-04 DIAGNOSIS — Z87442 Personal history of urinary calculi: Secondary | ICD-10-CM | POA: Diagnosis not present

## 2023-02-04 DIAGNOSIS — M797 Fibromyalgia: Secondary | ICD-10-CM | POA: Diagnosis present

## 2023-02-04 DIAGNOSIS — Z79899 Other long term (current) drug therapy: Secondary | ICD-10-CM | POA: Diagnosis not present

## 2023-02-04 DIAGNOSIS — Z7984 Long term (current) use of oral hypoglycemic drugs: Secondary | ICD-10-CM | POA: Diagnosis not present

## 2023-02-04 DIAGNOSIS — Z8673 Personal history of transient ischemic attack (TIA), and cerebral infarction without residual deficits: Secondary | ICD-10-CM | POA: Diagnosis not present

## 2023-02-04 DIAGNOSIS — Z87891 Personal history of nicotine dependence: Secondary | ICD-10-CM | POA: Diagnosis not present

## 2023-02-04 DIAGNOSIS — E785 Hyperlipidemia, unspecified: Secondary | ICD-10-CM | POA: Diagnosis present

## 2023-02-04 DIAGNOSIS — Z794 Long term (current) use of insulin: Secondary | ICD-10-CM | POA: Diagnosis not present

## 2023-02-04 LAB — CBC
HCT: 34.9 % — ABNORMAL LOW (ref 36.0–46.0)
Hemoglobin: 11.3 g/dL — ABNORMAL LOW (ref 12.0–15.0)
MCH: 30.4 pg (ref 26.0–34.0)
MCHC: 32.4 g/dL (ref 30.0–36.0)
MCV: 93.8 fL (ref 80.0–100.0)
Platelets: 160 10*3/uL (ref 150–400)
RBC: 3.72 MIL/uL — ABNORMAL LOW (ref 3.87–5.11)
RDW: 13.2 % (ref 11.5–15.5)
WBC: 7.6 10*3/uL (ref 4.0–10.5)
nRBC: 0 % (ref 0.0–0.2)

## 2023-02-04 LAB — LIPID PANEL
Cholesterol: 111 mg/dL (ref 0–200)
HDL: 40 mg/dL — ABNORMAL LOW (ref >40)
LDL Cholesterol: 41 mg/dL (ref 0–99)
Total CHOL/HDL Ratio: 2.8 {ratio}
Triglycerides: 150 mg/dL — ABNORMAL HIGH (ref <150)
VLDL: 30 mg/dL (ref 0–40)

## 2023-02-04 LAB — GLUCOSE, CAPILLARY
Glucose-Capillary: 106 mg/dL — ABNORMAL HIGH (ref 70–99)
Glucose-Capillary: 194 mg/dL — ABNORMAL HIGH (ref 70–99)
Glucose-Capillary: 139 mg/dL — ABNORMAL HIGH (ref 70–99)
Glucose-Capillary: 83 mg/dL (ref 70–99)
Glucose-Capillary: 113 mg/dL — ABNORMAL HIGH (ref 70–99)

## 2023-02-04 LAB — BASIC METABOLIC PANEL
Anion gap: 10 (ref 5–15)
BUN: 21 mg/dL (ref 8–23)
CO2: 25 mmol/L (ref 22–32)
Calcium: 9.3 mg/dL (ref 8.9–10.3)
Chloride: 102 mmol/L (ref 98–111)
Creatinine, Ser: 1.01 mg/dL — ABNORMAL HIGH (ref 0.44–1.00)
GFR, Estimated: 56 mL/min — ABNORMAL LOW (ref >60)
Glucose, Bld: 110 mg/dL — ABNORMAL HIGH (ref 70–99)
Potassium: 3.7 mmol/L (ref 3.5–5.1)
Sodium: 137 mmol/L (ref 135–145)

## 2023-02-04 MED ORDER — LORATADINE 10 MG PO TABS
10.0000 mg | ORAL_TABLET | Freq: Every day | ORAL | Status: DC
  Administered 2023-02-04 – 2023-02-05 (×2): 10 mg via ORAL
  Filled 2023-02-04 (×2): qty 1

## 2023-02-04 MED ORDER — AMLODIPINE BESYLATE 2.5 MG PO TABS
2.5000 mg | ORAL_TABLET | Freq: Every day | ORAL | Status: DC
  Administered 2023-02-04 – 2023-02-05 (×2): 2.5 mg via ORAL
  Filled 2023-02-04 (×2): qty 1

## 2023-02-04 NOTE — Assessment & Plan Note (Signed)
 BMI 30.9, class I

## 2023-02-04 NOTE — Progress Notes (Addendum)
 Overnight cross coverage.  Received a call from bedside RN regarding the patient having persistent chest pressure.  Constant pressure, occurring at rest, moderate to severe, centrally located and radiating to her left arm.  Stat EKG obtained, it showed no evidence of acute ischemia.    Presented at bedside.  The patient is in the room accompanied by her daughter.  She is on nitro drip.  Blood pressure is still not controlled, elevated.  Added home Toprol -XL.    As needed low-dose IV morphine  also added for persistent chest pressure refractory to nitroglycerin  infusion.  On-call cardiology, Dr. Leonce, consulted to assist with the management.   Time: 15 minutes.

## 2023-02-04 NOTE — Assessment & Plan Note (Signed)
 Glucose good - Continue glimepiride and Lantus - Continue SS corrections

## 2023-02-04 NOTE — Assessment & Plan Note (Signed)
-   Continue Ranexa, metoprolol, Imdur - Continue aspirin, Plavix, Lipitor - Consult cardiology, appreciate cares - Add amlodipine

## 2023-02-04 NOTE — Plan of Care (Signed)
   Problem: Education: Goal: Ability to describe self-care measures that may prevent or decrease complications (Diabetes Survival Skills Education) will improve Outcome: Progressing

## 2023-02-04 NOTE — Progress Notes (Signed)
 Progress Note  Patient Name: Jill Howard Date of Encounter: 02/04/2023  Primary Cardiologist:   Dorn Lesches, MD   Subjective   Chest pain last night but none since NTG was increased .    Inpatient Medications    Scheduled Meds:  aspirin  EC  81 mg Oral Daily   atorvastatin   80 mg Oral QHS   busPIRone   5 mg Oral BID   cholecalciferol   2,000 Units Oral Daily   clopidogrel   75 mg Oral Daily   empagliflozin   10 mg Oral Daily   enoxaparin  (LOVENOX ) injection  40 mg Subcutaneous Q24H   ezetimibe   10 mg Oral Daily   glimepiride   4 mg Oral Q breakfast   insulin  aspart  0-15 Units Subcutaneous TID WC   insulin  aspart  0-5 Units Subcutaneous QHS   insulin  glargine-yfgn  56 Units Subcutaneous QHS   isosorbide  mononitrate  120 mg Oral q morning   magnesium  oxide  200 mg Oral Daily   metFORMIN   1,000 mg Oral BID WC   metoprolol  succinate  50 mg Oral QHS   ranolazine   1,000 mg Oral BID   vitamin B-12  100 mcg Oral Daily   Continuous Infusions:  nitroGLYCERIN  30 mcg/min (02/04/23 0307)   PRN Meds: acetaminophen  **OR** acetaminophen , morphine  injection, ondansetron  **OR** ondansetron  (ZOFRAN ) IV, senna-docusate   Vital Signs    Vitals:   02/03/23 2105 02/03/23 2328 02/04/23 0327 02/04/23 0828  BP: (!) 186/78  (!) 155/67 (!) 139/56  Pulse:   63   Resp:  18 18   Temp:  98.6 F (37 C) 98.5 F (36.9 C) 98.5 F (36.9 C)  TempSrc:  Oral Oral Oral  SpO2:   93%   Weight:      Height:        Intake/Output Summary (Last 24 hours) at 02/04/2023 1137 Last data filed at 02/04/2023 0307 Gross per 24 hour  Intake 67.32 ml  Output --  Net 67.32 ml   Filed Weights   02/03/23 1855  Weight: 76.7 kg    Telemetry    NSR, PVCs - Personally Reviewed  ECG    NA - Personally Reviewed  Physical Exam   GEN: No acute distress.   Neck: No  JVD Cardiac: RRR, no murmurs, rubs, or gallops.  Respiratory: Clear  to auscultation bilaterally. GI: Soft, nontender, non-distended   MS: No  edema; No deformity. Neuro:  Nonfocal  Psych: Normal affect   Labs    Chemistry Recent Labs  Lab 02/03/23 1236 02/04/23 0230  NA 135 137  K 4.7 3.7  CL 100 102  CO2 22 25  GLUCOSE 309* 110*  BUN 19 21  CREATININE 1.01* 1.01*  CALCIUM  9.6 9.3  PROT 5.9*  --   ALBUMIN 2.8*  --   AST 20  --   ALT 14  --   ALKPHOS 55  --   BILITOT 1.0  --   GFRNONAA 56* 56*  ANIONGAP 13 10     Hematology Recent Labs  Lab 02/03/23 1236 02/04/23 0230  WBC 6.0 7.6  RBC 3.87 3.72*  HGB 11.8* 11.3*  HCT 36.1 34.9*  MCV 93.3 93.8  MCH 30.5 30.4  MCHC 32.7 32.4  RDW 13.2 13.2  PLT 189 160    Cardiac EnzymesNo results for input(s): TROPONINI in the last 168 hours. No results for input(s): TROPIPOC in the last 168 hours.   BNP Recent Labs  Lab 02/03/23 1236  BNP 204.2*  DDimer No results for input(s): DDIMER in the last 168 hours.   Radiology    DG Chest 2 View Result Date: 02/03/2023 CLINICAL DATA:  Chest pain. EXAM: CHEST - 2 VIEW COMPARISON:  Chest radiograph dated 12/08/2022. FINDINGS: No focal consolidation, pleural effusion, pneumothorax. The cardiac silhouette is within normal limits. Atherosclerotic calcification of the aorta. Coronary vascular calcification. Degenerative changes of the spine. No acute osseous pathology. IMPRESSION: No active cardiopulmonary disease. Electronically Signed   By: Vanetta Chou M.D.   On: 02/03/2023 12:39    Cardiac Studies   06/30/2022  Diagnostic Dominance: Right    Patient Profile     83 y.o. female with a history of unrevascularized, calcified, multivessel coronary artery disease, CVA, HTN, HLD, type 2 diabetes mellitus who is being seen today for the evaluation of chest pain at the request of Dr. Shona.   Assessment & Plan    Chest pain: The patient has coronary disease that has been managed medically.  For now medical therapy.  I personally reviewed the cardiac cath films.  She has poor targets for any  surgical intervention.  She has small vessels and multiple targets so it is difficult to understand whether she would benefit from PCI.  The enzymes are not elevated.  There is been no acute ST segment changes.  Her blood pressure is elevated.  She has previously been off Norvasc  because of low blood pressures but I would not restart low-dose 2.5 mg in addition to optimize noted therapy.  I am not planning to repeat cardiac cath at this point.  We are going to try to wean th NTG to off and I discussed this with nursing.   For questions or updates, please contact [facility] Please consult [email or web address] for contact info under Cardiology/STEMI.   Signed, Lynwood Schilling, MD  02/04/2023, 11:37 AM

## 2023-02-04 NOTE — Assessment & Plan Note (Signed)
-   Continue aspirin, Plavix Lipitor

## 2023-02-04 NOTE — Assessment & Plan Note (Signed)
-   Continue Buspar,

## 2023-02-04 NOTE — Progress Notes (Signed)
  Progress Note   Patient: Jill Howard FMW:[id number] DOB: 1940-07-14 DOA: 02/03/2023     0 DOS: the patient was seen and examined on 02/04/2023        Brief hospital course: 83 y.o. 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     Assessment and Plan: * Angina Per cardiology, no good targets for intervention.  Will attempt medical mgmt. - Continue Ranexa , metoprolol , Imdur  - Continue aspirin , Plavix , Lipitor  - Consult cardiology, appreciate cares - Add amlodipine  - Wean nitroglycerin  drip   Mood disorder (HCC) - Continue Buspar ,   Obesity (BMI 30-39.9) BMI 30.9, class I  Diabetes mellitus with complication (HCC) Glucose good - Continue glimepiride  and Lantus  - Continue SS corrections  Essential hypertension BP slightly above target - Continue metoprolol  - Continue Imdur , Randexa, jardiance  - Start amlodipine   Cerebrovascular disease - Continue aspirin , Plavix  Lipitor           Subjective: Patient has had some nausea with standing today, also dizziness.  No vomiting.  Chest pain appears to be improving/managed at the moment.  No fever, no respiratory distress, no nursing concerns     Physical Exam: BP 135/69 (BP Location: Left Arm)   Pulse 63   Temp 98.4 F (36.9 C) (Oral)   Resp 20   Ht 5' 2 (1.575 m)   Wt 76.7 kg   SpO2 93%   BMI 30.93 kg/m   Elderly adult female, lying in bed, interactive and appropriate Heart rate slow and regular, no murmurs, no peripheral edema Respiratory rate normal, lungs clear without rales or wheezes Abdomen soft without tenderness palpation or guarding, no ascites or distention Attention normal, affect appropriate, judgment and insight appear normal, moves upper extremities with normal strength and coordination, speech fluent    Data Reviewed: Basic metabolic panel unremarkable LDL  41 Hemoglobin 11.3 on CBC Troponin negative   Family Communication: Husband, children at the bedside    Disposition: Status is: Observation The patient requires ongoing IV nitroglycerin  and should be changed to inpatient        Author: [name], MD 02/04/2023 2:28 PM  For on call review [email or web address].

## 2023-02-04 NOTE — Plan of Care (Signed)

## 2023-02-04 NOTE — Hospital Course (Signed)
 83 y.o. 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

## 2023-02-04 NOTE — Assessment & Plan Note (Signed)
 BP slightly above target - Continue metoprolol - Continue Imdur, Randexa, jardiance - Start amlodipine

## 2023-02-04 NOTE — Care Management Obs Status (Signed)
 MEDICARE OBSERVATION STATUS NOTIFICATION   Patient Details  Name: Jill Howard MRN: [id number] Date of Birth: 1940-02-18   Medicare Observation Status Notification Given:  Yes    Lawerance Sabal, RN 02/04/2023, 3:10 PM

## 2023-02-05 DIAGNOSIS — I200 Unstable angina: Secondary | ICD-10-CM | POA: Diagnosis not present

## 2023-02-05 LAB — GLUCOSE, CAPILLARY
Glucose-Capillary: 48 mg/dL — ABNORMAL LOW (ref 70–99)
Glucose-Capillary: 84 mg/dL (ref 70–99)
Glucose-Capillary: 49 mg/dL — ABNORMAL LOW (ref 70–99)
Glucose-Capillary: 174 mg/dL — ABNORMAL HIGH (ref 70–99)
Glucose-Capillary: 86 mg/dL (ref 70–99)

## 2023-02-05 MED ORDER — AMLODIPINE BESYLATE 5 MG PO TABS: 5.0000 mg | ORAL_TABLET | Freq: Every day | ORAL | 3 refills | Status: AC

## 2023-02-05 MED ORDER — DEXTROSE 50 % IV SOLN
50.0000 mL | INTRAVENOUS | Status: DC | PRN
  Administered 2023-02-05: 50 mL via INTRAVENOUS
  Filled 2023-02-05: qty 50

## 2023-02-05 MED ORDER — AMLODIPINE BESYLATE 5 MG PO TABS: 5.0000 mg | ORAL_TABLET | Freq: Every day | ORAL | Status: DC

## 2023-02-05 MED ORDER — AMLODIPINE BESYLATE 2.5 MG PO TABS
2.5000 mg | ORAL_TABLET | Freq: Once | ORAL | Status: AC
  Administered 2023-02-05: 2.5 mg via ORAL
  Filled 2023-02-05: qty 1

## 2023-02-05 NOTE — Progress Notes (Signed)
 Hypoglycemic Event  CBG: 49  Treatment: D50 50 mL (25 gm)  Symptoms: None  Follow-up CBG: [id number] CBG Result: to be updated  Possible Reasons for Event: Unknown  Comments/MD notified: Dr Houston Siren  [facility]

## 2023-02-05 NOTE — Progress Notes (Addendum)
 Home long acting insulin and glimepiride held due to recurrent hypoglycemia. We will continue to closely monitor and treat as indicated.  No charge note.

## 2023-02-05 NOTE — Progress Notes (Signed)
 Hypoglycemic Event  CBG: 46   Treatment: 8 oz juice/soda  Symptoms: None  Follow-up CBG: Time:0359  CBG Result:  Possible Reasons for Event: Unknown  Comments/MD notified: Dr. Houston Siren  [facility]

## 2023-02-05 NOTE — Plan of Care (Signed)

## 2023-02-05 NOTE — Progress Notes (Signed)
 Progress Note  Patient Name: Jill Howard Date of Encounter: 02/05/2023  Primary Cardiologist:   Dorn Lesches, MD   Subjective   She was able to ambulate in the hall without chest pain.  She did report some mild pain last night.     Inpatient Medications    Scheduled Meds:  amLODipine   2.5 mg Oral Daily   aspirin  EC  81 mg Oral Daily   atorvastatin   80 mg Oral QHS   busPIRone   5 mg Oral BID   cholecalciferol   2,000 Units Oral Daily   clopidogrel   75 mg Oral Daily   empagliflozin   10 mg Oral Daily   enoxaparin  (LOVENOX ) injection  40 mg Subcutaneous Q24H   ezetimibe   10 mg Oral Daily   insulin  aspart  0-15 Units Subcutaneous TID WC   insulin  aspart  0-5 Units Subcutaneous QHS   isosorbide  mononitrate  120 mg Oral q morning   loratadine   10 mg Oral Daily   magnesium  oxide  200 mg Oral Daily   metFORMIN   1,000 mg Oral BID WC   metoprolol  succinate  50 mg Oral QHS   ranolazine   1,000 mg Oral BID   vitamin B-12  100 mcg Oral Daily   Continuous Infusions:  nitroGLYCERIN  Stopped (02/05/23 0400)   PRN Meds: acetaminophen  **OR** acetaminophen , dextrose , morphine  injection, ondansetron  **OR** ondansetron  (ZOFRAN ) IV, senna-docusate   Vital Signs    Vitals:   02/04/23 2057 02/05/23 0039 02/05/23 0402 02/05/23 0759  BP: (!) 153/71  (!) 150/60 137/80  Pulse: 69  (!) 59 60  Resp:  18 17 16   Temp:  98.6 F (37 C) 97.9 F (36.6 C) 98.7 F (37.1 C)  TempSrc:  Oral Oral Oral  SpO2:   95% 97%  Weight:   75.4 kg   Height:        Intake/Output Summary (Last 24 hours) at 02/05/2023 1004 Last data filed at 02/05/2023 9187 Gross per 24 hour  Intake 240 ml  Output --  Net 240 ml   Filed Weights   02/03/23 1855 02/05/23 0402  Weight: 76.7 kg 75.4 kg    Telemetry    NSR- Personally Reviewed  ECG    NA - Personally Reviewed  Physical Exam   GEN: No  acute distress.   Neck: No  JVD Cardiac: RRR, no murmurs, rubs, or gallops.  Respiratory: Clear   to  auscultation bilaterally. GI: Soft, nontender, non-distended, normal bowel sounds  MS:  No edema; No deformity. Neuro:   Nonfocal  Psych: Oriented and appropriate    Labs    Chemistry Recent Labs  Lab 02/03/23 1236 02/04/23 0230  NA 135 137  K 4.7 3.7  CL 100 102  CO2 22 25  GLUCOSE 309* 110*  BUN 19 21  CREATININE 1.01* 1.01*  CALCIUM  9.6 9.3  PROT 5.9*  --   ALBUMIN 2.8*  --   AST 20  --   ALT 14  --   ALKPHOS 55  --   BILITOT 1.0  --   GFRNONAA 56* 56*  ANIONGAP 13 10     Hematology Recent Labs  Lab 02/03/23 1236 02/04/23 0230  WBC 6.0 7.6  RBC 3.87 3.72*  HGB 11.8* 11.3*  HCT 36.1 34.9*  MCV 93.3 93.8  MCH 30.5 30.4  MCHC 32.7 32.4  RDW 13.2 13.2  PLT 189 160    Cardiac EnzymesNo results for input(s): TROPONINI in the last 168 hours. No results for input(s): TROPIPOC in the  last 168 hours.   BNP Recent Labs  Lab 02/03/23 1236  BNP 204.2*     DDimer No results for input(s): DDIMER in the last 168 hours.   Radiology    DG Chest 2 View Result Date: 02/03/2023 CLINICAL DATA:  Chest pain. EXAM: CHEST - 2 VIEW COMPARISON:  Chest radiograph dated 12/08/2022. FINDINGS: No focal consolidation, pleural effusion, pneumothorax. The cardiac silhouette is within normal limits. Atherosclerotic calcification of the aorta. Coronary vascular calcification. Degenerative changes of the spine. No acute osseous pathology. IMPRESSION: No active cardiopulmonary disease. Electronically Signed   By: Vanetta Chou M.D.   On: 02/03/2023 12:39    Cardiac Studies   06/30/2022  Diagnostic Dominance: Right    Patient Profile     83 y.o. female with a history of unrevascularized, calcified, multivessel coronary artery disease, CVA, HTN, HLD, type 2 diabetes mellitus who is being seen for the evaluation of chest pain at the request of Dr. Shona.   Assessment & Plan    Chest pain:   After reviewing the films and discussing with the family I am pursuing medical  management.  We weaned her IV nitroglycerin  off.  She is on Imdur .  We have added back amlodipine  and she concentrate on avoiding any hypertensive urgency's which might precipitate symptoms.    HTN:  I increased the Norvasc  today.    OK to go home from my standpoint and we will arrange follow up.    For questions or updates, please contact [facility] Please consult [email or web address] for contact info under Cardiology/STEMI.   Signed, Lynwood Schilling, MD  02/05/2023, 10:04 AM

## 2023-02-05 NOTE — Discharge Summary (Signed)
 Physician Discharge Summary   Patient: Jill Howard MRN: [id number] DOB: Jun 10, 1940  Admit date:     02/03/2023  Discharge date: 02/05/23  Discharge Physician: [name]   PCP: [name], PA-C     Recommendations at discharge:  Follow up with PCP Odor Dwane in 1 week for angina, hypoglycemia Follow up with Cardiology in 2 weeks for angina, appt scheduled Odor Hutchinson: Please check glucose log and adjust anti-glycemics as appropriate, see below     Discharge Diagnoses: Principal Problem:   Angina Active Problems:   Cerebrovascular disease   Essential hypertension   Diabetes mellitus with complication (HCC)   Obesity (BMI 30-39.9)   Mood disorder [facility])      Hospital Course: 83 y.o. 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       * Angina Patient presented with exertional chest discomfort, typical of her previous angina, started on nitroglycerin  infusion in the ER.  Cardiology were consulted, they reviewed her past angiography, recommended medical management.  Her Ranexa , Imdur , and metoprolol  were continued.  Amlodipine  was added back.  She tolerated this well, was titrated off nitroglycerin , and able to ambulate with tolerable symptoms.  Cardiology will follow-up in the office, appointment scheduled.       Diabetes mellitus with hypoglycemia Patient notes recent hypoglycemic episodes.  In the hospital, her home Jardiance , glimepiride , and Toujeo  were continued.  While here, she had overnight and morning hypoglycemia, these are minimally symptomatic and resolved with oral intake.  At discharge, she was discharged on a third of her home dose of Toujeo , and holding glimepiride .  Recommend close monitoring of blood glucose, and close follow-up with PCP for titration.          The [facility] Controlled Substances Registry was reviewed for this patient prior to discharge.  Consultants: Cardiology    Disposition: Home Diet recommendation:  Cardiac and Carb modified diet  DISCHARGE MEDICATION: Allergies as of 02/05/2023       Reactions   Influenza Vaccines Swelling   Arm was swollen in 2002    Sitagliptin Swelling   Guaifenesin Other (See Comments)   Unknown   Hydroxychloroquine  Other (See Comments)   Eye pain   Latex Other (See Comments)   Unknown   Nickel Other (See Comments)   Causes swelling of the arms   Nitrofurantoin Nausea Only   Other Other (See Comments)   Band-Aid   Phenylephrine-pheniramine-dm Other (See Comments)   Unknown; causes issues with blood pressure   Ultram [tramadol Hcl] Other (See Comments)   hallucinations   Wound Dressing Adhesive Itching   Escitalopram Oxalate Other (See Comments)   Hallucinations        Medication List     PAUSE taking these medications    glimepiride  4 MG tablet Wait to take this until your doctor or other care provider tells you to start again. Commonly known as: AMARYL  Take 4 mg by mouth daily with breakfast.   TOUJEO  SOLOSTAR Archer Wait to take this until your doctor or other care provider tells you to start again. Inject 56 Units into the skin at bedtime.       TAKE these medications    acetaminophen  500 MG tablet Commonly known as: TYLENOL  Take 1,000 mg by mouth every 6 (six) hours as needed for moderate pain (pain score 4-6) (Pain).   amLODipine  5 MG  tablet Commonly known as: NORVASC  Take 1 tablet (5 mg total) by mouth daily. Start taking on: February 06, 2023   aspirin  EC 81 MG tablet Take 1 tablet (81 mg total) by mouth daily. Swallow whole.   atorvastatin  80 MG tablet Commonly known as: LIPITOR  Take 1 tablet (80 mg total) by mouth at bedtime.   busPIRone  5 MG tablet Commonly known as: BUSPAR  Take 5 mg by mouth 2 (two) times daily.   Claritin  10 MG tablet Generic drug:  loratadine  Take 10 mg by mouth daily as needed for allergies.   clopidogrel  75 MG tablet Commonly known as: PLAVIX  Take 1 tablet (75 mg total) by mouth daily.   ezetimibe  10 MG tablet Commonly known as: ZETIA  Take 1 tablet (10 mg total) by mouth daily.   Fish Oil 1000 MG Caps Take 1,000 mg by mouth daily.   HumaLOG KwikPen 100 UNIT/ML KwikPen Generic drug: insulin  lispro Inject 5-22 Units into the skin as needed. Use at lunchtime if levels are still high; use sliding scale after up to a maximum of 22 units per day   isosorbide  mononitrate 120 MG 24 hr tablet Commonly known as: IMDUR  Take 1 tablet (120 mg total) by mouth every morning.   Jardiance  10 MG Tabs tablet Generic drug: empagliflozin  Take 10 mg by mouth daily.   Magnesium  250 MG Tabs Take 250 mg by mouth daily.   metFORMIN  1000 MG tablet Commonly known as: GLUCOPHAGE  Take 1,000 mg by mouth 2 (two) times daily with a meal.   metoprolol  succinate 100 MG 24 hr tablet Commonly known as: TOPROL -XL Take 1 tablet (100 mg total) by mouth at bedtime. Take with or immediately following a meal.   nitroGLYCERIN  0.4 MG SL tablet Commonly known as: NITROSTAT  Place 1 tablet (0.4 mg total) under the tongue every 5 (five) minutes x 3 doses as needed for chest pain.   ranolazine  500 MG 12 hr tablet Commonly known as: Ranexa  Take 2 tablets (1,000 mg total) by mouth 2 (two) times daily.   VITAMIN B-12 PO Take 1 tablet by mouth daily.   Vitamin D  50 MCG (2000 UT) tablet Take 1 tablet (2,000 Units total) by mouth daily.        Follow-up Information     Jerilynn Lamarr [name], NP Follow up on 02/27/2023.   Specialties: Cardiology, Radiology, Cardiology Why: Hospital Follow Up with NP for Dr. Court at 10:55 AM. Please arrive 15 mins early. Contact information: [address] [place] [address] [phone]         [name], PA-C Follow up.   Specialty: Physician Assistant Why: Please follow up  in a week. Contact information: [address] [name] [address] [phone]                 Discharge Instructions     Discharge instructions   Complete by: As directed    **IMPORTANT DISCHARGE INSTRUCTIONS**   From Dr. Jonel: You were admitted for chest pain, which we call 'angina' when it is from blockages in the arteries to the heart  You were evaluated by Cardiology, who recommend medical management over stenting or surgery in your particular case  Amlodipine  was added back, at 5 mg daily Thankfully, this seems to help  They have set you up with a Cardiology follow up appointment soon, see below in the To Do section   For your blood sugars Check your sugar every three times this afternoon If your sugar is LESS  THAN 70 mg/dL, drink some juice and check it again in 15 minutes  For the next week, continue your Tuojeo but REDUCE the dose to 20 units nightly for the next week Your sugars may rebound high and that is okay Better high for a few days than too low  If your sugar is CONSISTENTLY OVER 300 mg/dL, increase your Tuojeo back to your normal dose of 56 units nightly  Also, HOLD (do not take) your glimepiride  for now  Wait until you see your primary care physician for that   Increase activity slowly   Complete by: As directed        Discharge Exam: Filed Weights   02/03/23 1855 02/05/23 0402  Weight: 76.7 kg 75.4 kg    General: Pt is alert, awake, not in acute distress Cardiovascular: RRR, nl S1-S2, no murmurs appreciated.   No LE edema.   Respiratory: Normal respiratory rate and rhythm.  CTAB without rales or wheezes. Abdominal: Abdomen soft and non-tender.  No distension or HSM.   Neuro/Psych: Strength symmetric in upper and lower extremities.  Judgment and insight appear normal.   Condition at discharge: good  The results of significant diagnostics from this hospitalization (including imaging, microbiology, ancillary and laboratory)  are listed below for reference.   Imaging Studies: DG Chest 2 View Result Date: 02/03/2023 CLINICAL DATA:  Chest pain. EXAM: CHEST - 2 VIEW COMPARISON:  Chest radiograph dated 12/08/2022. FINDINGS: No focal consolidation, pleural effusion, pneumothorax. The cardiac silhouette is within normal limits. Atherosclerotic calcification of the aorta. Coronary vascular calcification. Degenerative changes of the spine. No acute osseous pathology. IMPRESSION: No active cardiopulmonary disease. Electronically Signed   By: Vanetta Chou M.D.   On: 02/03/2023 12:39    Microbiology: Results for orders placed or performed during the hospital encounter of 05/25/20  Resp Panel by RT-PCR (Flu A&B, Covid) Nasopharyngeal Swab     Status: None   Collection Time: 05/25/20  3:18 PM   Specimen: Nasopharyngeal Swab; Nasopharyngeal(NP) swabs in vial transport medium  Result Value Ref Range Status   SARS Coronavirus 2 by RT PCR NEGATIVE NEGATIVE Final    Comment: (NOTE) SARS-CoV-2 target nucleic acids are NOT DETECTED.  The SARS-CoV-2 RNA is generally detectable in upper respiratory specimens during the acute phase of infection. The lowest concentration of SARS-CoV-2 viral copies this assay can detect is 138 copies/mL. A negative result does not preclude SARS-Cov-2 infection and should not be used as the sole basis for treatment or other patient management decisions. A negative result may occur with  improper specimen collection/handling, submission of specimen other than nasopharyngeal swab, presence of viral mutation(s) within the areas targeted by this assay, and inadequate number of viral copies(<138 copies/mL). A negative result must be combined with clinical observations, patient history, and epidemiological information. The expected result is Negative.  Fact Sheet for Patients:  bloggercourse.com  Fact Sheet for Healthcare Providers:   seriousbroker.it  This test is no t yet approved or cleared by the [place]  FDA and  has been authorized for detection and/or diagnosis of SARS-CoV-2 by FDA under an Emergency Use Authorization (EUA). This EUA will remain  in effect (meaning this test can be used) for the duration of the COVID-19 declaration under Section 564(b)(1) of the Act, 21 U.S.C.section 360bbb-3(b)(1), unless the authorization is terminated  or revoked sooner.       Influenza A by PCR NEGATIVE NEGATIVE Final   Influenza B by PCR NEGATIVE NEGATIVE Final    Comment: (NOTE) The  Xpert Xpress SARS-CoV-2/FLU/RSV plus assay is intended as an aid in the diagnosis of influenza from Nasopharyngeal swab specimens and should not be used as a sole basis for treatment. Nasal washings and aspirates are unacceptable for Xpert Xpress SARS-CoV-2/FLU/RSV testing.  Fact Sheet for Patients: bloggercourse.com  Fact Sheet for Healthcare Providers: seriousbroker.it  This test is not yet approved or cleared by the [place]  FDA and has been authorized for detection and/or diagnosis of SARS-CoV-2 by FDA under an Emergency Use Authorization (EUA). This EUA will remain in effect (meaning this test can be used) for the duration of the COVID-19 declaration under Section 564(b)(1) of the Act, 21 U.S.C. section 360bbb-3(b)(1), unless the authorization is terminated or revoked.  Performed at [facility], [address] [place], [place] [address]     Labs: CBC: Recent Labs  Lab 02/03/23 1236 02/04/23 0230  WBC 6.0 7.6  HGB 11.8* 11.3*  HCT 36.1 34.9*  MCV 93.3 93.8  PLT 189 160   Basic Metabolic Panel: Recent Labs  Lab 02/03/23 1236 02/04/23 0230  NA 135 137  K 4.7 3.7  CL 100 102  CO2 22 25  GLUCOSE 309* 110*  BUN 19 21  CREATININE 1.01* 1.01*  CALCIUM  9.6 9.3   Liver Function Tests: Recent Labs  Lab 02/03/23 1236   AST 20  ALT 14  ALKPHOS 55  BILITOT 1.0  PROT 5.9*  ALBUMIN 2.8*   CBG: Recent Labs  Lab 02/05/23 0309 02/05/23 0358 02/05/23 0612 02/05/23 0650 02/05/23 1145  GLUCAP 48* 84 49* 174* 86    Discharge time spent: approximately 45 minutes spent on discharge counseling, evaluation of patient on day of discharge, and coordination of discharge planning with nursing, social work, pharmacy and case management  Signed: [name], MD [facility] 02/05/2023

## 2023-02-27 ENCOUNTER — Ambulatory Visit: Payer: Medicare Other | Admitting: Adult Health

## 2023-03-27 ENCOUNTER — Ambulatory Visit: Payer: Medicare Other | Admitting: Adult Health

## 2023-05-08 ENCOUNTER — Ambulatory Visit: Payer: Medicare Other | Admitting: Neurology
